# Patient Record
Sex: Female | Born: 1955
Health system: Southern US, Community
[De-identification: ages and names within clinical notes are randomized; demographics above are authoritative.]

## PROBLEM LIST (undated history)

## (undated) DIAGNOSIS — D72829 Elevated white blood cell count, unspecified: Secondary | ICD-10-CM

## (undated) DIAGNOSIS — F32A Depression, unspecified: Secondary | ICD-10-CM

## (undated) DIAGNOSIS — F329 Major depressive disorder, single episode, unspecified: Secondary | ICD-10-CM

## (undated) DIAGNOSIS — I2119 ST elevation (STEMI) myocardial infarction involving other coronary artery of inferior wall: Secondary | ICD-10-CM

## (undated) DIAGNOSIS — M797 Fibromyalgia: Secondary | ICD-10-CM

## (undated) DIAGNOSIS — J45909 Unspecified asthma, uncomplicated: Secondary | ICD-10-CM

## (undated) DIAGNOSIS — F419 Anxiety disorder, unspecified: Secondary | ICD-10-CM

## (undated) DIAGNOSIS — I739 Peripheral vascular disease, unspecified: Secondary | ICD-10-CM

## (undated) DIAGNOSIS — F17200 Nicotine dependence, unspecified, uncomplicated: Secondary | ICD-10-CM

## (undated) DIAGNOSIS — I2510 Atherosclerotic heart disease of native coronary artery without angina pectoris: Secondary | ICD-10-CM

## (undated) DIAGNOSIS — D72821 Monocytosis (symptomatic): Secondary | ICD-10-CM

## (undated) HISTORY — PX: CORONARY STENT PLACEMENT: SHX1402

## (undated) HISTORY — PX: TONSILLECTOMY: SUR1361

## (undated) HISTORY — DX: Elevated white blood cell count, unspecified: D72.829

## (undated) HISTORY — DX: Monocytosis (symptomatic): D72.821

## (undated) HISTORY — PX: CARDIAC CATHETERIZATION: SHX172

## (undated) HISTORY — DX: Peripheral vascular disease, unspecified: I73.9

---
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
## 2005-08-04 ENCOUNTER — Ambulatory Visit (HOSPITAL_COMMUNITY): Admission: RE | Admit: 2005-08-04 | Discharge: 2005-08-04 | Payer: Self-pay | Admitting: Internal Medicine

## 2007-12-06 ENCOUNTER — Encounter: Admission: RE | Admit: 2007-12-06 | Discharge: 2007-12-06 | Payer: Self-pay | Admitting: Otolaryngology

## 2008-08-14 ENCOUNTER — Ambulatory Visit: Payer: Self-pay | Admitting: Internal Medicine

## 2008-08-14 ENCOUNTER — Inpatient Hospital Stay (HOSPITAL_COMMUNITY): Admission: EM | Admit: 2008-08-14 | Discharge: 2008-08-15 | Payer: Self-pay | Admitting: Emergency Medicine

## 2008-12-11 ENCOUNTER — Encounter: Admission: RE | Admit: 2008-12-11 | Discharge: 2008-12-11 | Payer: Self-pay | Admitting: Family Medicine

## 2009-09-15 ENCOUNTER — Encounter: Admission: RE | Admit: 2009-09-15 | Discharge: 2009-09-15 | Payer: Self-pay | Admitting: Family Medicine

## 2010-10-18 ENCOUNTER — Encounter: Payer: Self-pay | Admitting: Family Medicine

## 2010-10-18 ENCOUNTER — Encounter: Payer: Self-pay | Admitting: Otolaryngology

## 2011-02-09 NOTE — Discharge Summary (Signed)
NAMEMARJARIE, [name]            ACCOUNT NO.:  [id number]   MEDICAL RECORD NO.:  [id number]          PATIENT TYPE:  INP   LOCATION:  2807                         FACILITY:  [facility]   PHYSICIAN:  Alvester Morin, M.D.  DATE OF BIRTH:  1956-07-18   DATE OF ADMISSION:  08/14/2008  DATE OF DISCHARGE:  08/15/2008                               DISCHARGE SUMMARY   DISCHARGE DIAGNOSES:  1. Noncardiac chest pain.  2. Diabetes.  3. Hypertension.  4. Hyperlipidemia.  5. Tobacco abuse.  6. Fibromyalgia.  7. Hoarseness.  8. Gingivitis.  9. Gastroesophageal reflux disease.   DISCHARGE MEDICATIONS:  1. Metformin 500 mg 2 tablet by mouth twice a day.  2. Hydrochlorothiazide 25 mg 1 tablet by mouth daily.  3. Aspirin 81 mg 1 tablet by mouth daily.  4. Omeprazole 40 mg 1 tablet by mouth daily.  5. NicoDerm 14 mg apply 1 patch every 24 hours.  6. Pravachol 40 mg 1 tablet by mouth daily.  7. The patient was instructed to use Ultram 50 mg q.6 h. p.r.n. for      pain and to avoid NSAIDs for pain control.  8. The patient was also instructed to stop using Chantix and to stop      using Zegerid.   DISPOSITION AND FOLLOWUP:  The patient had been discharged in stable  condition with a hospital followup with primary care physician Elizabeth Palau, FNP, on August 20, 2008, at 1:30 p.m.  It is going to be  important during that appointment to continue working on the risk  factors modification for this patient, pt will need to control her  hyperlipidemia and also will be important to change the  hydrochlorothiazide that she use 25 daily to probably 12.5 with a  combination of lisinopril, both in 1 tablet in order to provide the  renal protection as well as blood pressure control that she needs as an  Diabetic patient.  It is going to be important to schedule 6 months from  now another CT scan of the chest in order to follow up abnormalities  that were seen during this admission in a CT scan (please  see procedures  report for details).  It is going to be important to continue working  and providing other treatments to help the patient quit smoking and also  for her to avoid secondhand smoking as well.  It will be important to  review the patient's medication bottles and adjust her medications as  needed.  The patient will need a basic metabolic panel in order to  evaluate electrolyte status and renal function.   PROCEDURES PERFORMED DURING THIS ADMISSION:  The patient had a chest x-  ray on August 14, 2008, demonstrated a 1-cm ill-defined opacity in the  right perihilar mid lung.  The lungs were clear without any focal  infiltrate.  There was no edema or pleural effusion.  The  cardiopericardial silhouette was within normal limits for size.  The  patient also had a CT chest without contrast on August 14, 2008, which  demonstrated no definite CT correlation between question nodules on the  chest radiography.  However, there was innumerable diffuse pulmonary  pleural-based nodules bilaterally.  This is a nonspecific finding and  maybe seen with infection/inflammation among other etiologies.  Because  of the risk factors of being a smoker, the recommendation for followup  with another chest CT 6 months from now is recommended in order to rule  out bronchogenic carcinoma.  No other findings or abnormalities were  seen on the CT scan.  The patient also had cardiovascular  catheterization, which was essentially completely clean with just mild  20-30% atherosclerotic changes on RCA.  Ejection fraction was estimated  to be 65%, and there was no wall motion abnormalities.  Cath was  performed by Dr. Clarene Duke.  No other procedures were performed.   CONSULTATIONS:  Cardiology especially [facility].   HISTORY OF PRESENT ILLNESS AT THE MOMENT OF ADMISSION/VITAL SIGNS AND  LABORATORIES:  [name] is a 55 year old female with past  medical history and risk factors of  hypertension, diabetes mellitus,  tobacco abuse, and hyperlipidemia, who came to the emergency department  complaining of chest discomfort, pressure type in the middle of her  chest, nonradiated on and off for the last 3-4 weeks associated with  diaphoresis, dyspnea, and palpitation.  The patient reports that she had  been experiencing the discomfort at rest and also on exertion.  She  endorses that the pain lasts approximately 10-15 minutes, and then goes  away completely.  She had noticing that the pain is aggravated by  strenuous activity and relieved by rest.  The patient endorses weakness  and decreased on her strength in general.   VITAL SIGNS:  Temperature 97.8, blood pressure 119/91, heart rate 106,  respiratory rate 20, and oxygen saturation 99% on room air.   At admission laboratories; we have sodium of 136, potassium 3.6,  chloride 100, bicarb 25, blood sugar 124, creatinine 0.61, and BUN 17.  She had bilirubin of 0.8, alkaline phosphatase of 76, AST 46, ALT 49,  total protein 8.1, albumin blood 4.8, and calcium 10.1.  The patient had  BMP of less than 30.  The patient had CBC demonstrated white blood cell  17.4, hemoglobin 16.6, hematocrit 50.3, and platelets 288.  The patient  had 3 sets of cardiac enzymes and 2 cardiac markers point of care in the  emergency department, all of them completely normal.  The patient had a  D-dimer of 0.29, normal coagulation panel with a PT of 4.4, INR 0.9, and  a PTT of 38.  The patient have lipid profile during this admission that  demonstrated a total cholesterol of 214, triglyceride 183, HDL 33, and  LDL 144.  The patient had a hemoglobin A1c checked, and it was 7.3.  Also, a TSH in a normal range from 1.531.   HOSPITAL COURSE:  Problem #1.  Chest pain.  without coronary artery  disease as etiology of her chest pain after results of a clean cath.  Chest pain and dyspnea could be explained for the use of Chantix.  Decision to stop Chantix  and focus on risk factors modifications have  been made.  The patient is going to continue controlling her diabetes,  is going to use medication to control her blood pressure and we are  going to add Pravachol in order to address her hypercholesterolemia.  The patient is going to be followed by primary care physician on  August 20, 2008, and at that moment complete resolution of her chest  discomfort will be reevaluated.  During this admission, serial sets of  cardiac markers and EKGs were completely normal, also no abnormalities  whatsoever on the monitorization of her cardiac electrical activity.   Problem #2.  Diabetes mellitus with a hemoglobin A1c of 7.3, pretty well  controlled.  No changes are going to be made to her actual regimen.  The  patient is going to continue using metformin 2000 mg daily and is going  to follow by primary care physician on regular basis for further  adjustment.   Problem #3.  Hyperlipidemia.  With a complete abnormal fasting lipid  profile. Will start  Pravachol 40 mg by mouth daily, lifestyle  modification with a low-fat diet and exercise had been recommended.  The  patient is going to be followed as an outpatient and medication  adjustments will be advised by primary care physician..   Problem #4.  Tobacco abuse.  We discontinue Chantix on her and recommend  use of nicotine patch, prescription has been  provided; patient also had  a formal smoking cessation counseling as an inpatient.  She is going to  receive benefit of encouragement and also a different program or  medications if needed in order to quit smoking.  The patient will also  need to follow closely regarding findings on her CT scan during this  admission to rule out bronchogenic carcinoma.   Problem #5.  Nodules finding on her CT of chest during this admission.  As described on procedure report, there was multiple perihilar nodules  visible during this admission on a CT scan of the  chest with a range of  3-4 mm in size.  At this point, a second CT scan with a 43-month interval  had been recommended by the radiologist.  We have been advised the  patient's primary care physician and the patient itself in order to have  that done.  She is going to continue following further if any procedural  biopsy is needed is going to be arranged.   Problem #6.  Hypertension.  At this point, pretty well controlled using  hydrochlorothiazide 25 mg by mouth daily.  Our recommendation for a  diabetic will be to probably decrease to 12.5 and combine the p.o. with  ACE inhibitor in order for the patient to receive not only blood  pressure control, but also renal protection.  The decision is going to  be left at discretion of primary care physician.   Problem #7.  Gastroesophageal reflux disease.  The patient has been  started on omeprazole 40 mg by mouth daily.   Problem #8.  Fibromyalgia.  The patient had been instructed to avoid  indiscriminate use of NSAIDS to control her pain and had been advised to  use Ultram 50 mg by mouth q.6 h. as needed to control her pain.  She is  going to continue following with primary care physician for that  problem.   Problem #9. Gingivitis and gum infection.  The patient is going to  continue following a dentist and other teeth that need to be removed and  going to be removed in order to avoid more serious infection.   At discharge, the patient's vital signs were; blood pressure 131/36,  oxygen saturation 99% on room air, respiratory rate 18, heart rate 88,  temperature 98.0.   Her labs demonstrated CBC with white blood cells of 8.8, hemoglobin  15.0, hematocrit 43.4, and platelet 248, and we have a BMET that showed  sodium of 138, potassium 3.8, chloride 103,  bicarb 25, blood sugar 137,  BUN 16, creatinine 0.59, and calcium 9.5.      Rosanna Randy, MD  Electronically Signed      Alvester Morin, M.D.  Electronically Signed     CEM/MEDQ  D:  08/15/2008  T:  08/16/2008  Job:  [id number]   cc:   Rosey Bath L. Dareen Piano, FNP

## 2011-02-09 NOTE — Cardiovascular Report (Signed)
[name], DRAGOO            ACCOUNT NO.:  [id number]   MEDICAL RECORD NO.:  [id number]          PATIENT TYPE:  INP   LOCATION:  6532                         FACILITY:  [facility]   PHYSICIAN:  Thereasa Solo. Little, M.D. DATE OF BIRTH:  1956/04/07   DATE OF PROCEDURE:  08/15/2008  DATE OF DISCHARGE:                            CARDIAC CATHETERIZATION   INDICATIONS FOR TEST:  This 55 year old female was admitted with chest  pain on August 14, 2008.  Her cardiac markers were negative and her  EKG shows nonspecific T-wave changes.  Her cardiovascular risk factors  include a family history of heart disease, diabetes mellitus, and  cigarette abuse, and a family history of heart disease.   After obtaining informed consent, the patient was prepped and draped in  the usual sterile fashion exposing the right groin.  Following local  anesthetic with 1% Xylocaine, the Seldinger technique was employed.  A 5-  French introducer sheath was placed in the right femoral artery.  Left  and right coronary arteriography and ventriculography in the RAO  projection was performed.   COMPLICATIONS:  None.   EQUIPMENT:  5-French Judkins configuration catheters.   TOTAL CONTRAST:  65 mL.   RESULTS:  1. Hemodynamic monitoring.  Her central aortic pressure was 104/68.      Her left ventricular pressure was 104/1.  There was no significant      gradient noted at the time of pullback from the left ventricle into      the ascending aorta.  2. Ventriculography.  Ventriculography in the RAO projection using 25      mL of contrast at 12 mL per second revealed the LV systolic      function to be normal to slightly hyperdynamic with an ejection      fraction in excess of 65%.  No mitral regurgitation was      appreciated.  Her left ventricular end-diastolic pressure was 7.  3. Coronary arteriography:  No calcification in the distribution of      the coronary arteries were noted on fluoroscopy.  4. Left main was  normal and bifurcated.  5. Circumflex.  The circumflex had minor irregularities in the      proximal portion and gave rise to 1 large OM vessel that was free      of disease.  6. LAD.  The LAD extended down to, but did not reach the apex of the      heart.  Just distal to the takeoff of first diagonal was an area of      30% narrowing with myocardial bridging.  There was TIMI III flow      down the vessel.  The first diagonal had minimal irregularities in      the ostium and otherwise was free of disease.  7. Right coronary artery.  The right coronary artery had a tubular 30%      area of narrowing in its proximal segment with some minimal      irregularities in the mid distal portion of the vessel.  The PDA      and posterior lateral vessel were  free of disease.   CONCLUSION:  1. Normal LV systolic function.  2. 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   I cannot explain her chest pain based on the above cath data.  I did  discuss termination of her cigarette use and risk factor modification  with the patient.  I did not restart her heparin, her arterial sheath  will be removed, and from a cardiac standpoint we will be happy to  reevaluate her as needed.           ______________________________  Thereasa Solo Little, M.D.     ABL/MEDQ  D:  08/15/2008  T:  08/15/2008  Job:  [id number]   cc:   Nanetta Batty, M.D.  Alvester Morin, M.D.  Cardiac Cath Lab

## 2011-06-29 ENCOUNTER — Other Ambulatory Visit: Payer: Self-pay | Admitting: Family Medicine

## 2011-06-29 DIAGNOSIS — J984 Other disorders of lung: Secondary | ICD-10-CM

## 2011-06-30 LAB — HEMOGLOBIN A1C
Hgb A1c MFr Bld: 7.3 — ABNORMAL HIGH
Mean Plasma Glucose: 163

## 2011-06-30 LAB — DIFFERENTIAL
Basophils Absolute: 0.1
Basophils Relative: 0
Eosinophils Absolute: 0.2
Eosinophils Relative: 1
Lymphocytes Relative: 15
Lymphs Abs: 2.6
Monocytes Absolute: 0.9
Monocytes Relative: 5
Neutro Abs: 13.6 — ABNORMAL HIGH
Neutrophils Relative %: 78 — ABNORMAL HIGH

## 2011-06-30 LAB — BASIC METABOLIC PANEL
BUN: 16
CO2: 25
Calcium: 9.5
Chloride: 103
Creatinine, Ser: 0.59
GFR calc Af Amer: >60
GFR calc non Af Amer: >60
Glucose, Bld: 137 — ABNORMAL HIGH
Potassium: 3.4 — ABNORMAL LOW
Sodium: 138

## 2011-06-30 LAB — POCT CARDIAC MARKERS
CKMB, poc: 1.7
Myoglobin, poc: 59.6
Troponin i, poc: <0.05

## 2011-06-30 LAB — COMPREHENSIVE METABOLIC PANEL
ALT: 49 — ABNORMAL HIGH
AST: 46 — ABNORMAL HIGH
Albumin: 4.8
Alkaline Phosphatase: 76
BUN: 17
CO2: 25
Calcium: 10.1
Chloride: 100
Creatinine, Ser: 0.61
GFR calc Af Amer: >60
GFR calc non Af Amer: >60
Glucose, Bld: 124 — ABNORMAL HIGH
Potassium: 3.6
Sodium: 136
Total Bilirubin: 0.8
Total Protein: 8.1

## 2011-06-30 LAB — CBC
HCT: 50.3 — ABNORMAL HIGH
Hemoglobin: 16.6 — ABNORMAL HIGH
MCHC: 33
MCHC: 34.5
MCV: 92.1
Platelets: 288
RBC: 4.82
RBC: 5.47 — ABNORMAL HIGH
RDW: 12.5
RDW: 12.7
WBC: 17.4 — ABNORMAL HIGH

## 2011-06-30 LAB — CARDIAC PANEL(CRET KIN+CKTOT+MB+TROPI)
CK, MB: 1.5
CK, MB: 1.5
Relative Index: INVALID
Relative Index: INVALID
Total CK: 41
Total CK: 47
Troponin I: <0.01
Troponin I: <0.01

## 2011-06-30 LAB — CK TOTAL AND CKMB (NOT AT ARMC)
CK, MB: 1.7
Relative Index: INVALID
Total CK: 59

## 2011-06-30 LAB — GLUCOSE, CAPILLARY
Glucose-Capillary: 126 — ABNORMAL HIGH
Glucose-Capillary: 129 — ABNORMAL HIGH
Glucose-Capillary: 152 — ABNORMAL HIGH

## 2011-06-30 LAB — POCT I-STAT, CHEM 8
BUN: 24 — ABNORMAL HIGH
Calcium, Ion: 1.05 — ABNORMAL LOW
Chloride: 109
Creatinine, Ser: 0.6
Glucose, Bld: 113 — ABNORMAL HIGH
HCT: 52 — ABNORMAL HIGH
Hemoglobin: 17.7 — ABNORMAL HIGH
Potassium: 3.9
Sodium: 139
TCO2: 23

## 2011-06-30 LAB — B-NATRIURETIC PEPTIDE (CONVERTED LAB): Pro B Natriuretic peptide (BNP): <30

## 2011-06-30 LAB — APTT: aPTT: 38 — ABNORMAL HIGH

## 2011-06-30 LAB — TSH: TSH: 1.531

## 2011-06-30 LAB — PROTIME-INR
INR: 0.9
Prothrombin Time: 12.4
Prothrombin Time: 13.2

## 2011-06-30 LAB — LIPID PANEL
HDL: 33 — ABNORMAL LOW
Total CHOL/HDL Ratio: 6.5

## 2011-06-30 LAB — TROPONIN I: Troponin I: 0.01

## 2011-06-30 LAB — D-DIMER, QUANTITATIVE: D-Dimer, Quant: 0.29

## 2011-06-30 LAB — HEPARIN LEVEL (UNFRACTIONATED): Heparin Unfractionated: 0.42

## 2012-02-14 ENCOUNTER — Telehealth: Payer: Self-pay | Admitting: Internal Medicine

## 2012-02-14 NOTE — Telephone Encounter (Signed)
s/w pt and she will c/b with   her wrk sch  aom

## 2012-02-17 ENCOUNTER — Telehealth: Payer: Self-pay | Admitting: Internal Medicine

## 2012-02-17 NOTE — Telephone Encounter (Signed)
l/m to call to sch new pt appt   aom

## 2012-02-18 ENCOUNTER — Telehealth: Payer: Self-pay | Admitting: Internal Medicine

## 2012-02-18 NOTE — Telephone Encounter (Signed)
l/m re new pt ref and ref turned back in    aom

## 2012-07-01 ENCOUNTER — Emergency Department (HOSPITAL_COMMUNITY)
Admission: EM | Admit: 2012-07-01 | Discharge: 2012-07-01 | Disposition: A | Discharge status: Home / Self Care | Payer: 59 | Attending: Emergency Medicine | Admitting: Emergency Medicine

## 2012-07-01 ENCOUNTER — Emergency Department (HOSPITAL_COMMUNITY): Payer: 59

## 2012-07-01 ENCOUNTER — Encounter (HOSPITAL_COMMUNITY): Payer: Self-pay

## 2012-07-01 DIAGNOSIS — M549 Dorsalgia, unspecified: Secondary | ICD-10-CM

## 2012-07-01 DIAGNOSIS — M542 Cervicalgia: Secondary | ICD-10-CM | POA: Insufficient documentation

## 2012-07-01 DIAGNOSIS — Z79899 Other long term (current) drug therapy: Secondary | ICD-10-CM | POA: Insufficient documentation

## 2012-07-01 DIAGNOSIS — M79609 Pain in unspecified limb: Secondary | ICD-10-CM | POA: Insufficient documentation

## 2012-07-01 DIAGNOSIS — R079 Chest pain, unspecified: Secondary | ICD-10-CM | POA: Insufficient documentation

## 2012-07-01 DIAGNOSIS — E119 Type 2 diabetes mellitus without complications: Secondary | ICD-10-CM | POA: Insufficient documentation

## 2012-07-01 DIAGNOSIS — R209 Unspecified disturbances of skin sensation: Secondary | ICD-10-CM | POA: Insufficient documentation

## 2012-07-01 DIAGNOSIS — M546 Pain in thoracic spine: Secondary | ICD-10-CM | POA: Insufficient documentation

## 2012-07-01 DIAGNOSIS — R1110 Vomiting, unspecified: Secondary | ICD-10-CM | POA: Insufficient documentation

## 2012-07-01 HISTORY — DX: Fibromyalgia: M79.7

## 2012-07-01 LAB — HEPATIC FUNCTION PANEL
ALT: 22 U/L (ref 0–35)
AST: 22 U/L (ref 0–37)
Albumin: 4 g/dL (ref 3.5–5.2)
Indirect Bilirubin: 0.1 mg/dL — ABNORMAL LOW (ref 0.3–0.9)
Total Protein: 7.1 g/dL (ref 6.0–8.3)

## 2012-07-01 LAB — BASIC METABOLIC PANEL
CO2: 20 mEq/L (ref 19–32)
Calcium: 10.1 mg/dL (ref 8.4–10.5)
Chloride: 101 mEq/L (ref 96–112)
Potassium: 4 mEq/L (ref 3.5–5.1)
Sodium: 133 mEq/L — ABNORMAL LOW (ref 135–145)

## 2012-07-01 LAB — URINALYSIS, ROUTINE W REFLEX MICROSCOPIC
Hgb urine dipstick: NEGATIVE
Specific Gravity, Urine: 1.01 (ref 1.005–1.030)
Urobilinogen, UA: 0.2 mg/dL (ref 0.0–1.0)

## 2012-07-01 LAB — TROPONIN I: Troponin I: <0.3 ng/mL (ref <0.30)

## 2012-07-01 LAB — CBC WITH DIFFERENTIAL/PLATELET
Basophils Absolute: 0 10*3/uL (ref 0.0–0.1)
Lymphocytes Relative: 12 % (ref 12–46)
Neutro Abs: 14.2 10*3/uL — ABNORMAL HIGH (ref 1.7–7.7)
Neutrophils Relative %: 83 % — ABNORMAL HIGH (ref 43–77)
Platelets: 255 10*3/uL (ref 150–400)
RBC: 4.93 MIL/uL (ref 3.87–5.11)
RDW: 13.1 % (ref 11.5–15.5)
WBC: 17.1 10*3/uL — ABNORMAL HIGH (ref 4.0–10.5)

## 2012-07-01 MED ORDER — HYDROCODONE-ACETAMINOPHEN 5-500 MG PO TABS: 1.0000 | ORAL_TABLET | Freq: Four times a day (QID) | ORAL | Status: DC | PRN

## 2012-07-01 MED ORDER — ONDANSETRON HCL 8 MG PO TABS: 8.0000 mg | ORAL_TABLET | Freq: Three times a day (TID) | ORAL | Status: DC | PRN

## 2012-07-01 MED ORDER — HYDROCODONE-ACETAMINOPHEN 5-325 MG PO TABS
2.0000 | ORAL_TABLET | Freq: Once | ORAL | Status: AC
  Administered 2012-07-01: 2 via ORAL
  Filled 2012-07-01: qty 2

## 2012-07-01 NOTE — ED Provider Notes (Signed)
History  This chart was scribed for Sydney Roots, MD by Erskine Emery. This patient was seen in room APA02/APA02 and the patient's care was started at 14:46.   CSN: [id number]  Arrival date & time 07/01/12  1341   First MD Initiated Contact with Patient 07/01/12 1446      Chief Complaint  Patient presents with  . Chest Pain    (Consider location/radiation/quality/duration/timing/severity/associated sxs/prior treatment) The history is provided by the patient. No language interpreter was used.  [name] is a 56 y.o. female who presents to the Emergency Department complaining of constan neck, upper back/trapezius area pain that radiates to bil arms x 2 days. Pain constant, dull. No specific exacerbating or alleviating symptoms. No pain w rom neck. No stiffness or rigidity. No loss of sensation or weakness, but occasionally tingling bil finger tips. No headache. Occasionally will radiate around to chest. Pain is not pleuritic.denies sob. No leg pain or swelling. No orthopnea or pnd. No hx cad or family hx premature cad.  Pt denies any associated cough or fever.  Pt denies any previous episodes of similar symptoms before this month.  Pt has no h/o DDD, but sees a chiropractor for upper back pain. Pt denies any family h/o heart trouble at a young age. Hx fibromyalgia. Pt is a smoker. No recent injury, neck or back strain.      Past Medical History  Diagnosis Date  . Diabetes mellitus   . Fibromyalgia     History reviewed. No pertinent past surgical history.  No family history on file.  History  Substance Use Topics  . Smoking status: Current Every Day Smoker  . Smokeless tobacco: Not on file  . Alcohol Use: No    OB History    Grav Para Term Preterm Abortions TAB SAB Ect Mult Living                  Review of Systems  Constitutional: Negative for fever and chills.  HENT: Negative for congestion, neck stiffness and sinus pressure.   Eyes: Negative for pain and  redness.  Respiratory: Negative for cough.   Cardiovascular: Positive for chest pain. Negative for palpitations and leg swelling.  Gastrointestinal: Positive for vomiting. Negative for abdominal pain and diarrhea.  Genitourinary: Negative for dysuria and hematuria.  Musculoskeletal: Positive for back pain.       Arm pain  Skin: Negative for rash.  Neurological: Negative for seizures, weakness and headaches.  Hematological: Negative.   Psychiatric/Behavioral: Negative for dysphoric mood.  All other systems reviewed and are negative.    Allergies  Review of patient's allergies indicates no known allergies.  Home Medications   Current Outpatient Rx  Name Route Sig Dispense Refill  . BUSPIRONE HCL 5 MG PO TABS Oral Take 5 mg by mouth 2 (two) times daily.    . CYCLOBENZAPRINE HCL 10 MG PO TABS Oral Take 5-10 mg by mouth 3 (three) times daily as needed. For muscle spasm    . DESVENLAFAXINE SUCCINATE ER 50 MG PO TB24 Oral Take 50 mg by mouth daily.    Marland Kitchen DIPHENHYDRAMINE-APAP (SLEEP) 25-500 MG PO TABS Oral Take 2 tablets by mouth at bedtime as needed. For sleep    . LAMOTRIGINE 100 MG PO TABS Oral Take 100 mg by mouth daily.    Marland Kitchen SAXAGLIPTIN-METFORMIN ER 2.01-999 MG PO TB24 Oral Take 2 tablets by mouth daily.      Triage Vitals: BP 141/85  Pulse 78  Temp 97.9 F (36.6  C) (Oral)  Resp 16  Ht 5\' 4"  (1.626 m)  Wt 146 lb (66.225 kg)  BMI 25.06 kg/m2  SpO2 98%  Physical Exam  Nursing note and vitals reviewed. Constitutional: She is oriented to person, place, and time. She appears well-developed and well-nourished. No distress.  HENT:  Head: Normocephalic and atraumatic.  Nose: Nose normal.  Mouth/Throat: Oropharynx is clear and moist.  Eyes: Conjunctivae normal and EOM are normal.  Neck: Normal range of motion. Neck supple. No tracheal deviation present.       No pain upon ROM. No stiffness or rigidity.  Cardiovascular: Normal rate, regular rhythm, normal heart sounds and intact  distal pulses.   Pulmonary/Chest: Effort normal and breath sounds normal. No respiratory distress. She has no rales.  Abdominal: Soft. Bowel sounds are normal. She exhibits no distension and no mass. There is no tenderness. There is no rebound and no guarding.  Genitourinary:       No cva tenderness  Musculoskeletal: Normal range of motion. She exhibits no edema and no tenderness.       CTLS spine, non tender, aligned, no step off. bil trap muscular tenderness  Neurological: She is alert and oriented to person, place, and time.       No pronator drift. Motor intact bil, strength 5/5, steady gait  Skin: Skin is warm and dry. No rash noted.  Psychiatric: She has a normal mood and affect.    ED Course  Procedures (including critical care time) DIAGNOSTIC STUDIES: Oxygen Saturation is 97% on room air, normal by my interpretation.    COORDINATION OF CARE: 15:03--I evaluated the patient and we discussed a treatment plan including blood work and eKG to which the pt agreed.    Labs Reviewed  CBC WITH DIFFERENTIAL - Abnormal; Notable for the following:    WBC 17.1 (*)     Hemoglobin 15.4 (*)     Neutrophils Relative 83 (*)     Neutro Abs 14.2 (*)     All other components within normal limits  BASIC METABOLIC PANEL - Abnormal; Notable for the following:    Sodium 133 (*)     Glucose, Bld 141 (*)     All other components within normal limits  TROPONIN I   Results for orders placed during the hospital encounter of 07/01/12  CBC WITH DIFFERENTIAL      Component Value Range   WBC 17.1 (*) 4.0 - 10.5 K/uL   RBC 4.93  3.87 - 5.11 MIL/uL   Hemoglobin 15.4 (*) 12.0 - 15.0 g/dL   HCT 16.1  09.6 - 04.5 %   MCV 90.5  78.0 - 100.0 fL   MCH 31.2  26.0 - 34.0 pg   MCHC 34.5  30.0 - 36.0 g/dL   RDW 40.9  81.1 - 91.4 %   Platelets 255  150 - 400 K/uL   Neutrophils Relative 83 (*) 43 - 77 %   Neutro Abs 14.2 (*) 1.7 - 7.7 K/uL   Lymphocytes Relative 12  12 - 46 %   Lymphs Abs 2.1  0.7 - 4.0  K/uL   Monocytes Relative 4  3 - 12 %   Monocytes Absolute 0.7  0.1 - 1.0 K/uL   Eosinophils Relative 1  0 - 5 %   Eosinophils Absolute 0.2  0.0 - 0.7 K/uL   Basophils Relative 0  0 - 1 %   Basophils Absolute 0.0  0.0 - 0.1 K/uL  BASIC METABOLIC PANEL  Component Value Range   Sodium 133 (*) 135 - 145 mEq/L   Potassium 4.0  3.5 - 5.1 mEq/L   Chloride 101  96 - 112 mEq/L   CO2 20  19 - 32 mEq/L   Glucose, Bld 141 (*) 70 - 99 mg/dL   BUN 17  6 - 23 mg/dL   Creatinine, Ser 1.61  0.50 - 1.10 mg/dL   Calcium 09.6  8.4 - 04.5 mg/dL   GFR calc non Af Amer >90  >90 mL/min   GFR calc Af Amer >90  >90 mL/min  TROPONIN I      Component Value Range   Troponin I <0.30  <0.30 ng/mL  HEPATIC FUNCTION PANEL      Component Value Range   Total Protein 7.1  6.0 - 8.3 g/dL   Albumin 4.0  3.5 - 5.2 g/dL   AST 22  0 - 37 U/L   ALT 22  0 - 35 U/L   Alkaline Phosphatase 81  39 - 117 U/L   Total Bilirubin 0.2 (*) 0.3 - 1.2 mg/dL   Bilirubin, Direct 0.1  0.0 - 0.3 mg/dL   Indirect Bilirubin 0.1 (*) 0.3 - 0.9 mg/dL  LIPASE, BLOOD      Component Value Range   Lipase 26  11 - 59 U/L  URINALYSIS, ROUTINE W REFLEX MICROSCOPIC      Component Value Range   Color, Urine STRAW (*) YELLOW   APPearance CLEAR  CLEAR   Specific Gravity, Urine 1.010  1.005 - 1.030   pH 5.5  5.0 - 8.0   Glucose, UA NEGATIVE  NEGATIVE mg/dL   Hgb urine dipstick NEGATIVE  NEGATIVE   Bilirubin Urine NEGATIVE  NEGATIVE   Ketones, ur NEGATIVE  NEGATIVE mg/dL   Protein, ur NEGATIVE  NEGATIVE mg/dL   Urobilinogen, UA 0.2  0.0 - 1.0 mg/dL   Nitrite NEGATIVE  NEGATIVE   Leukocytes, UA NEGATIVE  NEGATIVE   Dg Chest 2 View  07/01/2012  *RADIOLOGY REPORT*  Clinical Data: Chest, upper back, neck and shoulder pain with vomiting.  CHEST - 2 VIEW  Comparison: Radiographs 08/14/2008.  CT 09/15/2009.  Findings: The heart size and mediastinal contours are normal. The lungs are clear. There is no pleural effusion or pneumothorax. No acute  osseous findings are identified.  Telemetry leads overlie the chest.  IMPRESSION: Stable examination.  No acute cardiopulmonary process.   Original Report Authenticated By: Gerrianne Scale, M.D.    Dg Cervical Spine Complete  07/01/2012  *RADIOLOGY REPORT*  Clinical Data: Neck, upper back and left shoulder pain.  CERVICAL SPINE - COMPLETE 4+ VIEW  Comparison: Neck CT 12/06/2007.  Findings: The prevertebral soft tissues are normal.  The alignment is near anatomic.  There is asymmetric right-sided facet hypertrophy as demonstrated on the prior examination.  This contributes to some right foraminal stenosis at C3-C4 and C4-C5. The left foramina appear patent.  No acute osseous findings are seen.  IMPRESSION: Stable spondylosis with asymmetric facet hypertrophy on the right. No acute osseous findings.   Original Report Authenticated By: Gerrianne Scale, M.D.        MDM  I personally performed the services described in this documentation, which was scribed in my presence. The recorded information has been reviewed and considered. Sydney Roots, MD  Reviewed nursing notes and prior charts for additional history.   Reviewed prior charts, prior eval for cp noted w cath w no signif cad.   Date: 07/01/2012  Rate: 83  Rhythm: normal sinus rhythm  QRS Axis: normal  Intervals: normal  ST/T Wave abnormalities: normal  Conduction Disutrbances:none  Narrative Interpretation:   Old EKG Reviewed: unchanged   After symptoms present/constant x 2 days, trop neg. Prior cardiac cath without signif cad. No nv in ed. abd soft nt.   Pt appears stable for d/c.     Sydney Roots, MD 07/01/12 1655

## 2012-07-01 NOTE — ED Notes (Signed)
Pt reports since Thursday has been having pain in upper back radiating to arms.  Today started having pain in chest.  Also reports vomiting x 3 weeks and episodes of being diaphoretic.

## 2012-07-07 ENCOUNTER — Other Ambulatory Visit: Payer: Self-pay | Admitting: Cardiology

## 2012-07-07 ENCOUNTER — Inpatient Hospital Stay (HOSPITAL_COMMUNITY)
Admission: EM | Admit: 2012-07-07 | Discharge: 2012-07-08 | DRG: 247 | Discharge status: Against Medical Advice | Payer: 59 | Attending: Cardiology | Admitting: Cardiology

## 2012-07-07 ENCOUNTER — Other Ambulatory Visit: Payer: Self-pay

## 2012-07-07 ENCOUNTER — Observation Stay (HOSPITAL_COMMUNITY): Admission: AD | Admit: 2012-07-07 | Payer: 59 | Source: Ambulatory Visit | Admitting: Cardiology

## 2012-07-07 ENCOUNTER — Encounter (HOSPITAL_COMMUNITY)
Admission: EM | Discharge status: Against Medical Advice | Payer: Self-pay | Source: Home / Self Care | Attending: Cardiology

## 2012-07-07 ENCOUNTER — Encounter (HOSPITAL_COMMUNITY): Payer: Self-pay | Admitting: *Deleted

## 2012-07-07 ENCOUNTER — Emergency Department (HOSPITAL_COMMUNITY): Payer: 59

## 2012-07-07 ENCOUNTER — Ambulatory Visit (HOSPITAL_COMMUNITY): Admit: 2012-07-07 | Payer: Self-pay | Admitting: Cardiovascular Disease

## 2012-07-07 DIAGNOSIS — IMO0001 Reserved for inherently not codable concepts without codable children: Secondary | ICD-10-CM | POA: Diagnosis present

## 2012-07-07 DIAGNOSIS — I2510 Atherosclerotic heart disease of native coronary artery without angina pectoris: Secondary | ICD-10-CM | POA: Diagnosis present

## 2012-07-07 DIAGNOSIS — I2119 ST elevation (STEMI) myocardial infarction involving other coronary artery of inferior wall: Principal | ICD-10-CM | POA: Diagnosis present

## 2012-07-07 DIAGNOSIS — E785 Hyperlipidemia, unspecified: Secondary | ICD-10-CM | POA: Diagnosis present

## 2012-07-07 DIAGNOSIS — F17200 Nicotine dependence, unspecified, uncomplicated: Secondary | ICD-10-CM | POA: Diagnosis present

## 2012-07-07 DIAGNOSIS — I214 Non-ST elevation (NSTEMI) myocardial infarction: Secondary | ICD-10-CM

## 2012-07-07 DIAGNOSIS — J329 Chronic sinusitis, unspecified: Secondary | ICD-10-CM | POA: Diagnosis present

## 2012-07-07 DIAGNOSIS — F329 Major depressive disorder, single episode, unspecified: Secondary | ICD-10-CM | POA: Diagnosis present

## 2012-07-07 DIAGNOSIS — M797 Fibromyalgia: Secondary | ICD-10-CM | POA: Diagnosis present

## 2012-07-07 DIAGNOSIS — E1165 Type 2 diabetes mellitus with hyperglycemia: Secondary | ICD-10-CM | POA: Diagnosis present

## 2012-07-07 DIAGNOSIS — E119 Type 2 diabetes mellitus without complications: Secondary | ICD-10-CM

## 2012-07-07 DIAGNOSIS — I959 Hypotension, unspecified: Secondary | ICD-10-CM | POA: Diagnosis not present

## 2012-07-07 DIAGNOSIS — F32A Depression, unspecified: Secondary | ICD-10-CM | POA: Diagnosis present

## 2012-07-07 DIAGNOSIS — F3289 Other specified depressive episodes: Secondary | ICD-10-CM | POA: Diagnosis present

## 2012-07-07 DIAGNOSIS — F17210 Nicotine dependence, cigarettes, uncomplicated: Secondary | ICD-10-CM | POA: Diagnosis present

## 2012-07-07 DIAGNOSIS — I10 Essential (primary) hypertension: Secondary | ICD-10-CM | POA: Diagnosis present

## 2012-07-07 DIAGNOSIS — Z79899 Other long term (current) drug therapy: Secondary | ICD-10-CM

## 2012-07-07 DIAGNOSIS — IMO0002 Reserved for concepts with insufficient information to code with codable children: Secondary | ICD-10-CM | POA: Diagnosis present

## 2012-07-07 HISTORY — DX: ST elevation (STEMI) myocardial infarction involving other coronary artery of inferior wall: I21.19

## 2012-07-07 HISTORY — DX: Nicotine dependence, unspecified, uncomplicated: F17.200

## 2012-07-07 HISTORY — DX: Major depressive disorder, single episode, unspecified: F32.9

## 2012-07-07 HISTORY — PX: PERCUTANEOUS CORONARY STENT INTERVENTION (PCI-S): SHX5485

## 2012-07-07 HISTORY — DX: Atherosclerotic heart disease of native coronary artery without angina pectoris: I25.10

## 2012-07-07 HISTORY — DX: Depression, unspecified: F32.A

## 2012-07-07 HISTORY — PX: LEFT HEART CATHETERIZATION WITH CORONARY ANGIOGRAM: SHX5451

## 2012-07-07 HISTORY — DX: Anxiety disorder, unspecified: F41.9

## 2012-07-07 HISTORY — DX: Unspecified asthma, uncomplicated: J45.909

## 2012-07-07 LAB — CBC WITH DIFFERENTIAL/PLATELET
Hemoglobin: 15.8 g/dL — ABNORMAL HIGH (ref 12.0–15.0)
Lymphocytes Relative: 12 % (ref 12–46)
Lymphs Abs: 2.8 10*3/uL (ref 0.7–4.0)
Monocytes Relative: 8 % (ref 3–12)
Neutro Abs: 17.6 10*3/uL — ABNORMAL HIGH (ref 1.7–7.7)
Neutrophils Relative %: 77 % (ref 43–77)
RBC: 5.02 MIL/uL (ref 3.87–5.11)

## 2012-07-07 LAB — COMPREHENSIVE METABOLIC PANEL
Albumin: 4.2 g/dL (ref 3.5–5.2)
Alkaline Phosphatase: 73 U/L (ref 39–117)
BUN: 15 mg/dL (ref 6–23)
CO2: 27 mEq/L (ref 19–32)
Chloride: 101 mEq/L (ref 96–112)
GFR calc Af Amer: >90 mL/min (ref >90)
Glucose, Bld: 150 mg/dL — ABNORMAL HIGH (ref 70–99)
Potassium: 4.7 mEq/L (ref 3.5–5.1)
Total Bilirubin: 0.2 mg/dL — ABNORMAL LOW (ref 0.3–1.2)

## 2012-07-07 LAB — TROPONIN I: Troponin I: 3.79 ng/mL (ref <0.30)

## 2012-07-07 LAB — APTT: aPTT: 41 seconds — ABNORMAL HIGH (ref 24–37)

## 2012-07-07 LAB — GLUCOSE, CAPILLARY: Glucose-Capillary: 151 mg/dL — ABNORMAL HIGH (ref 70–99)

## 2012-07-07 SURGERY — LEFT HEART CATHETERIZATION WITH CORONARY ANGIOGRAM
Anesthesia: LOCAL

## 2012-07-07 MED ORDER — ONDANSETRON HCL 4 MG/2ML IJ SOLN
INTRAMUSCULAR | Status: AC
  Filled 2012-07-07: qty 2

## 2012-07-07 MED ORDER — NITROGLYCERIN 0.4 MG SL SUBL: 0.4000 mg | SUBLINGUAL_TABLET | SUBLINGUAL | Status: DC | PRN

## 2012-07-07 MED ORDER — MIDAZOLAM HCL 2 MG/2ML IJ SOLN
INTRAMUSCULAR | Status: AC
  Filled 2012-07-07: qty 2

## 2012-07-07 MED ORDER — NITROGLYCERIN IN D5W 200-5 MCG/ML-% IV SOLN
3.0000 ug/min | INTRAVENOUS | Status: DC
  Administered 2012-07-07: 10 ug/min via INTRAVENOUS
  Filled 2012-07-07: qty 250

## 2012-07-07 MED ORDER — METOPROLOL TARTRATE 25 MG PO TABS
25.0000 mg | ORAL_TABLET | Freq: Two times a day (BID) | ORAL | Status: DC
  Administered 2012-07-07: 25 mg via ORAL
  Filled 2012-07-07 (×3): qty 1

## 2012-07-07 MED ORDER — ASPIRIN 300 MG RE SUPP
300.0000 mg | RECTAL | Status: DC
  Filled 2012-07-07: qty 1

## 2012-07-07 MED ORDER — SODIUM CHLORIDE 0.9 % IV SOLN: INTRAVENOUS | Status: AC

## 2012-07-07 MED ORDER — FENTANYL CITRATE 0.05 MG/ML IJ SOLN
INTRAMUSCULAR | Status: AC
Start: 2012-07-07 — End: 2012-07-07
  Filled 2012-07-07: qty 2

## 2012-07-07 MED ORDER — PRASUGREL HCL 10 MG PO TABS
10.0000 mg | ORAL_TABLET | Freq: Every day | ORAL | Status: DC
  Administered 2012-07-08: 10 mg via ORAL
  Filled 2012-07-07: qty 1

## 2012-07-07 MED ORDER — ACETAMINOPHEN 325 MG PO TABS: 650.0000 mg | ORAL_TABLET | ORAL | Status: DC | PRN

## 2012-07-07 MED ORDER — HEPARIN BOLUS VIA INFUSION: 4000.0000 [IU] | Freq: Once | INTRAVENOUS | Status: DC

## 2012-07-07 MED ORDER — ONDANSETRON HCL 4 MG/2ML IJ SOLN: 4.0000 mg | Freq: Four times a day (QID) | INTRAMUSCULAR | Status: DC | PRN

## 2012-07-07 MED ORDER — ATROPINE SULFATE 1 MG/ML IJ SOLN
INTRAMUSCULAR | Status: AC
  Filled 2012-07-07: qty 1

## 2012-07-07 MED ORDER — ASPIRIN 81 MG PO CHEW
81.0000 mg | CHEWABLE_TABLET | Freq: Every day | ORAL | Status: DC
  Administered 2012-07-08: 81 mg via ORAL
  Filled 2012-07-07: qty 1

## 2012-07-07 MED ORDER — ASPIRIN 81 MG PO CHEW: 324.0000 mg | CHEWABLE_TABLET | ORAL | Status: DC

## 2012-07-07 MED ORDER — ASPIRIN EC 81 MG PO TBEC: 81.0000 mg | DELAYED_RELEASE_TABLET | Freq: Every day | ORAL | Status: DC

## 2012-07-07 MED ORDER — HEPARIN (PORCINE) IN NACL 100-0.45 UNIT/ML-% IJ SOLN
800.0000 [IU]/h | INTRAMUSCULAR | Status: DC
  Filled 2012-07-07: qty 250

## 2012-07-07 MED ORDER — LIDOCAINE HCL (PF) 1 % IJ SOLN
INTRAMUSCULAR | Status: AC
  Filled 2012-07-07: qty 30

## 2012-07-07 MED ORDER — NITROGLYCERIN 0.2 MG/ML ON CALL CATH LAB
INTRAVENOUS | Status: AC
  Filled 2012-07-07: qty 1

## 2012-07-07 MED ORDER — BIVALIRUDIN 250 MG IV SOLR
INTRAVENOUS | Status: AC
  Filled 2012-07-07: qty 250

## 2012-07-07 MED ORDER — SODIUM CHLORIDE 0.9 % IV SOLN
0.2500 mg/kg/h | INTRAVENOUS | Status: AC
  Filled 2012-07-07: qty 250

## 2012-07-07 MED ORDER — ATORVASTATIN CALCIUM 80 MG PO TABS
80.0000 mg | ORAL_TABLET | Freq: Every day | ORAL | Status: DC
  Administered 2012-07-07: 80 mg via ORAL
  Filled 2012-07-07 (×2): qty 1

## 2012-07-07 MED ORDER — PRASUGREL HCL 10 MG PO TABS
ORAL_TABLET | ORAL | Status: AC
  Filled 2012-07-07: qty 6

## 2012-07-07 NOTE — Progress Notes (Signed)
Checked back on her. Still having some chest pressure. I once again encouraged cath. She seemed more calm at this time without her husband in the room. She wanted to talk it over with her husband.   In the mean time, I spoke to nurse to get orders taken off and begun while she is waiting for bed upstairs.

## 2012-07-07 NOTE — ED Provider Notes (Signed)
History     CSN: [id number]  Arrival date & time 07/07/12  1635   First MD Initiated Contact with Patient 07/07/12 1645      No chief complaint on file.   (Consider location/radiation/quality/duration/timing/severity/associated sxs/prior treatment) Patient is a 56 y.o. female presenting with chest pain. The history is provided by the patient.  Chest Pain The chest pain began 5 - 7 days ago. Duration of episode(s) is 1 hour. Chest pain occurs intermittently. The chest pain is worsening. The pain is associated with exertion. At its most intense, the pain is at 9/10. The pain is currently at 0/10. The severity of the pain is severe. The quality of the pain is described as dull, heavy, pressure-like, similar to previous episodes and squeezing. The pain radiates to the left neck, left shoulder, right shoulder, right arm, left arm and left jaw. Exacerbated by: seems to be worse with exertion and lying down. Primary symptoms include shortness of breath and cough. Pertinent negatives for primary symptoms include no wheezing, no palpitations, no abdominal pain, no nausea and no vomiting.  Associated symptoms include diaphoresis.  Pertinent negatives for associated symptoms include no near-syncope. She tried narcotics for the symptoms. Risk factors include smoking/tobacco exposure.  Her past medical history is significant for diabetes, hyperlipidemia and hypertension.  Pertinent negatives for past medical history include no CAD.  Procedure history is positive for cardiac catheterization. Procedure history comments: cath in 2010 that was neg.     Past Medical History  Diagnosis Date  . Diabetes mellitus   . Fibromyalgia     No past surgical history on file.  No family history on file.  History  Substance Use Topics  . Smoking status: Current Every Day Smoker  . Smokeless tobacco: Not on file  . Alcohol Use: No    OB History    Grav Para Term Preterm Abortions TAB SAB Ect Mult Living                    Review of Systems  Constitutional: Positive for diaphoresis.  Respiratory: Positive for cough and shortness of breath. Negative for wheezing.        Pt states recently finished an abx for sinus infection and productive cough  Cardiovascular: Positive for chest pain. Negative for palpitations and near-syncope.  Gastrointestinal: Negative for nausea, vomiting and abdominal pain.  All other systems reviewed and are negative.    Allergies  Review of patient's allergies indicates no known allergies.  Home Medications   Current Outpatient Rx  Name Route Sig Dispense Refill  . BUSPIRONE HCL 5 MG PO TABS Oral Take 5 mg by mouth 2 (two) times daily.    Marland Kitchen CARISOPRODOL 350 MG PO TABS Oral Take 350 mg by mouth 4 (four) times daily as needed. Muscle pain    . DESVENLAFAXINE SUCCINATE ER 50 MG PO TB24 Oral Take 50 mg by mouth daily.    Marland Kitchen HYDROCODONE-ACETAMINOPHEN 5-500 MG PO TABS Oral Take 1-2 tablets by mouth every 6 (six) hours as needed for pain. 20 tablet 0  . LAMOTRIGINE 100 MG PO TABS Oral Take 100 mg by mouth daily.    . CYCLOBENZAPRINE HCL 10 MG PO TABS Oral Take 5-10 mg by mouth 3 (three) times daily as needed. For muscle spasm    . DIPHENHYDRAMINE-APAP (SLEEP) 25-500 MG PO TABS Oral Take 2 tablets by mouth at bedtime as needed. For sleep    . ONDANSETRON HCL 8 MG PO TABS Oral Take 1 tablet (  8 mg total) by mouth every 8 (eight) hours as needed for nausea. 8 tablet 0  . SAXAGLIPTIN-METFORMIN ER 2.01-999 MG PO TB24 Oral Take 2 tablets by mouth daily.      BP 139/80  Pulse 94  Temp 98.3 F (36.8 C) (Oral)  Resp 17  SpO2 98%  Physical Exam  Nursing note and vitals reviewed. Constitutional: She is oriented to person, place, and time. She appears well-developed and well-nourished. No distress.  HENT:  Head: Normocephalic and atraumatic.  Mouth/Throat: Oropharynx is clear and moist.  Eyes: Conjunctivae normal and EOM are normal. Pupils are equal, round, and reactive  to light.  Neck: Normal range of motion. Neck supple.  Cardiovascular: Normal rate, regular rhythm and intact distal pulses.   No murmur heard. Pulmonary/Chest: Effort normal and breath sounds normal. No respiratory distress. She has no wheezes. She has no rales.  Abdominal: Soft. She exhibits no distension. There is no tenderness. There is no rebound and no guarding.  Musculoskeletal: Normal range of motion. She exhibits edema. She exhibits no tenderness.       2+ edema in lower ext  Neurological: She is alert and oriented to person, place, and time.  Skin: Skin is warm and dry. No rash noted. No erythema.  Psychiatric: She has a normal mood and affect. Her behavior is normal.    ED Course  Procedures (including critical care time)  Labs Reviewed - No data to display Dg Chest 2 View  07/07/2012  *RADIOLOGY REPORT*  Clinical Data: Intermittent chest pain.  Productive cough.  Recent myocardial infarction.  Tobacco use.  CHEST - 2 VIEW  Comparison: 07/01/2012  Findings: Lower cervical spondylosis noted.  Faintly prominent interstitium is common in smokers.  The lungs appear otherwise clear. Cardiac and mediastinal contours appear unremarkable.  No pleural effusion observed.  IMPRESSION:  1.  Faintly prominent interstitium, characteristic of smokers. Otherwise the lungs appear clear. 2.  Lower cervical spondylosis.   Original Report Authenticated By: Dellia Cloud, M.D.      Date: 07/07/2012  Rate: 88  Rhythm: normal sinus rhythm  QRS Axis: normal  Intervals: normal  ST/T Wave abnormalities: nonspecific ST/T changes, inferior more pronounced q-waves  Conduction Disutrbances:none  Narrative Interpretation:   Old EKG Reviewed: changes noted   1. NSTEMI (non-ST elevated myocardial infarction)    CRITICAL CARE Performed by: Gwyneth Sprout   Total critical care time: 30  Critical care time was exclusive of separately billable procedures and treating other  patients.  Critical care was necessary to treat or prevent imminent or life-threatening deterioration.  Critical care was time spent personally by me on the following activities: development of treatment plan with patient and/or surrogate as well as nursing, discussions with consultants, evaluation of patient's response to treatment, examination of patient, obtaining history from patient or surrogate, ordering and performing treatments and interventions, ordering and review of laboratory studies, ordering and review of radiographic studies, pulse oximetry and re-evaluation of patient's condition.    MDM   Patient since in from Dr. Norris Cross office with worsening chest pain concerning for ACS. Patient has new more pronounced Q waves inferiorly and had lab tests drawn at Dr. Norris Cross office with apparently an abnormal troponin. Patient currently has already had aspirin and denies pain.  She had a normal cath in 2010 however patient continues to smoke and is diabetic. The symptoms are worse with exertion and with lying down she denies pain exacerbation by eating. Currently she has normal vital signs.  It  appears the Dr. Mayford Knife was planning on admitting the pt for CP work up but will confirm.  6:36 PM It was confirmed with cards that Troponin was 3 and CK-MB was 25.  They will admit the pt.    Gwyneth Sprout, MD 07/07/12 [phone]

## 2012-07-07 NOTE — Consult Note (Addendum)
ANTICOAGULATION CONSULT NOTE - Initial Consult  Pharmacy Consult for Heparin Indication: chest pain/ACS  No Known Allergies  Patient Measurements: Height: 5' 4.17" (163 cm) Weight: 145 lb 15.1 oz (66.2 kg) IBW/kg (Calculated) : 55.1  Heparin Dosing Weight: 66kg  Vital Signs: Temp: 98.3 F (36.8 C) (10/11 1645) Temp src: Oral (10/11 1645) BP: 141/79 mmHg (10/11 1700) Pulse Rate: 83  (10/11 1700)  Labs: No results found for this basename: HGB:2,HCT:3,PLT:3,APTT:3,LABPROT:3,INR:3,HEPARINUNFRC:3,CREATININE:3,CKTOTAL:3,CKMB:3,TROPONINI:3 in the last 72 hours  Estimated Creatinine Clearance: 75.5 ml/min (by C-G formula based on Cr of 0.52).   Medical History: Past Medical History  Diagnosis Date  . Diabetes mellitus   . Fibromyalgia    Assessment: 56yof to begin heparin awaiting urgent cath. BMET and CBC from 07/01/12 wnl.  Goal of Therapy:  Heparin level 0.3-0.7 units/ml Monitor platelets by anticoagulation protocol: Yes   Plan:  1) Heparin bolus 4000 units x 1 2) Heparin drip at 800 units/hr 3) Follow up after cath  Fredrik Rigger 07/07/2012,7:34 PM  Dr. Anne Fu said to hold off on starting the heparin as patient is going to cath right now. Will follow up after.  Fredrik Rigger 07/07/12, 7:40 PM

## 2012-07-07 NOTE — ED Notes (Signed)
Patient transported to X-ray 

## 2012-07-07 NOTE — Interval H&P Note (Signed)
History and Physical Interval Note:  07/07/2012 8:15 PM  [name]  has presented today for cardiac cath with the diagnosis of Non-Stemi  The various methods of treatment have been discussed with the patient and family. After consideration of risks, benefits and other options for treatment, the patient has consented to  Procedure(s) (LRB) with comments: LEFT HEART CATHETERIZATION WITH CORONARY ANGIOGRAM (N/A) as a surgical intervention .  The patient's history has been reviewed, patient examined, no change in status, stable for surgery.  I have reviewed the patient's chart and labs.  Questions were answered to the patient's satisfaction.     Ekta Dancer

## 2012-07-07 NOTE — CV Procedure (Signed)
Cardiac Catheterization Operative Report  [name] [id number] 10/11/20139:04 PM Sydney Alken, MD  Procedure Performed:  1. Left Heart Catheterization 2. Selective Coronary Angiography 3. Left ventricular angiogram 4. PTCA/DES x 1 distal RCA  Operator: Sydney Carrow, MD  Indication: 56 yo female with history of mild to moderate CAD by cath 2009, ongoing tobacco abuse, DM admitted with NSTEMI. Ongoing chest pain tonight. Urgent cath arranged by Dr. Anne Riddle who saw her in the ED.                                       Procedure Details: The risks, benefits, complications, treatment options, and expected outcomes were discussed with the patient. The patient and/or family concurred with the proposed plan, giving informed consent. The patient was brought to the cath lab after IV hydration was begun and oral premedication was given. The patient was further sedated with Versed and Fentanyl. The right groin was prepped and draped in the usual manner. Using the modified Seldinger access technique, a 6 French sheath was placed in the right femoral artery. Standard diagnostic catheters were used to perform selective coronary angiography. She was found to have a severe stenosis of the distal RCA. We elected to proceed to intervention. The patient was given a bolus of Angiomax and a drip was started. She was given Effient 60 mg po x 1. When the ACT was greater than 200, I engaged the RCA with a JR4 guiding catheter with side holes. I then passed a Cougar IC wire down the RCA. A 2.0 x 12 mm balloon was used to pre-dilate the severe stenosis. I then carefully positioned and deployed a 2.5 x 20 mm Promus Element DES in the distal RCA. This was post-dilated with a 2.5 x 15 mm Elida balloon. The stenosis was taken from 99% down to 0%. There was excellent flow into the distal vessel. The coronary wire was removed.  The guiding catheter was removed over a wire. A pigtail catheter was used to  perform a left ventricular angiogram.  There were no immediate complications. The patient was taken to the recovery area in stable condition.   Hemodynamic Findings: Central aortic pressure: 106/61 Left ventricular pressure: 103/4/10  Angiographic Findings:  Left main:  No obstructive disease.   Left Anterior Descending Artery: Moderate caliber vessel that does not reach the apex. There is 20% proximal stenosis and 30% mid stenosis. The diagonal branch is small to moderate sized and has mild plaque disease.   Circumflex Artery: Moderate caliber vessel with 20% proximal stenosis. There is a moderate sized obtuse marginal branch with mild non-obstructive plaque. There is a very small intermediate branch.   Right Coronary Artery: Moderate caliber vessel with diffuse 20% proximal and mid stenosis. The distal vessel has a 99% stenosis. The PDA and PLA are small in caliber and patent.   Left Ventricular Angiogram: LVEF 55%. Sublet inferior wall hypokinesis.   Impression: 1. NSTEMI secondary to severe stenosis distal RCA 2. Successful PTCA/DES x 1 distal RCA 3. Preserved LV systolic function with subtle inferior wall motion abnormality 4. Mild non-obstructive disease in LAD and Circumflex  Recommendations: She will need dual anti-platelet therapy with ASA and Effient for one year. Would start statin. Would start a beta blocker if her BP can tolerate.        Complications:  None. The patient tolerated the procedure well.

## 2012-07-07 NOTE — H&P (Signed)
Progress Notes     Patient: [name], [name] Provider: Armanda Magic, MD  DOB: June 18, 1956 Age: 56 Y Sex: Female Date: 07/07/2012  Phone: [phone]   Address: [address], [place], [id number]  Pcp: ELIZABETH BARNES       Subjective:     CC:    1. REFERRED DR Juluis Rainier EVALUATE CP/Hospital visit on saturday.        HPI:  General:  The patient presents today for evaluation of chest pain. She states that last Saturday she developed chest pain at rest. The pain is described as a sharp aching sensation like a muscle spasm which comes and goes. It is nonexertional. It is located over her chest with radiation in to her neck, jaw and both arms. It occurs 4-6 times daily. She gets SOB with the pain and diaphoresis. She has a history of panic attacks with chest pain in 2010. She had a heart cath at that time which showed normal coronary arteries. She went to the ER this past weekend and workup was normal. She was given an prescription for hydrocodone which has helped with the pain. She was started on Lamictal and Pristiq a few months ago and since then has had night sweats and has not felt well. She has a history of fibromyalgia pain as well..        ROS:  See HPI, A twelve system review was perfomed at today's visit. For pertinent positives and negatives see HPI.       Medical History: Diabetes II, Fibromyalgia, history of multiple nodules on chest CT, seemingly resolved, hoarseness with negative ENT evaluation, Smoker, anxiety, depression Saul Fordyce), Chronic sinus problems, Increased Lipids (pt refuses medications), Hx of psoriasis rash when stressed, History of elevated white count, History of negative cardiac catheter 07/2008, History elevated liver functions, History chronic reflux, History chronic gingivitis.        Gyn History:        OB History:        Surgical History: tonsillectomy , pilonidal cyst .        Hospitalization/Major Diagnostic Procedure: childbirth  x 3 .        Family History: Father: deceased bone cancer, Insulin dependant Mother: deceased kidney failure, DM II Brother 1: alive h/o knee/hip replacement Brother2: alive h/o broken hip Sister 1: deceased bone and lung cancer Sister 2: alive Children: alive x 3, one with MS, other two DM I  no autoimmune disease. Daughter has MS.       Social History:  General:  History of smoking  cigarettes: Current smoker Frequency: 1.5 PPD for 40 years no Alcohol.  Caffeine: yes, 1 cup a day of espresso.  no Recreational drug use.  Diet: high protein.  no Exercise.  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  Marital Status: married.  Children: three.        Medications: OneTouch Ultra Test . Strip 1 test once daily as directed, OneTouch Delica Lancets . Miscellaneous 1 test once daily as directed, Pristiq 50 MG Tablet Extended Release 24 Hour 1 tablet Once a day, BusPIRone HCl 5 MG Tablet 1 Tablet Twice a day, Lamotrigine 100 MG Tablet 1 tablet Once a day, Kombiglyze XR 2.01-999 MG Tablet Extended Release 24 Hour 2 tablets Once a day, Soma 350 MG Tablet 1 tablet as needed Four times a day as needed for muscle pain, Vicodin 5-500 MG Tablet 1 tablet as needed for pain every 6 hrs, Cyclobenzaprine-Diet Manage Pr  10 MG Miscellaneous 1/2 tablet to 1 tablet Daily, Medication List reviewed and reconciled with the patient       Allergies: Chantix: chest pain and panic attacks , Cholesterol Medications: Increased pain levels: Side Effects, cymbalta : sleepy : Side Effects.       Objective:     Vitals: Wt 146.8, Wt change -3 lb, Ht 63.75, BMI 25.39, Pulse sitting 100, BP sitting 130/88.       Examination:  Cardiology, General:  GENERAL APPEARANCE: pleasant, NAD.  HEENT: unremarkable.  CAROTID UPSTROKE: normal, no bruit.  JVD: flat.  HEART SOUNDS: regular, normal S1, S2, no S3 or S4.  MURMUR: absent.  LUNGS: no rales or wheezes.  ABDOMEN: soft, non tender, positive  bowel sounds, no masses felt.  EXTREMITIES: no leg edema.  PERIPHERAL PULSES: 2 plus bilateral.        Assessment:     Assessment:  1. Unstable angina - 411.1 (Primary)  2. Tobacco abuse - 305.1  3. Fibromyalgia - 729.1  4. Depression, major - 296.20    Plan:     1. Unstable angina  Despite that fact that she has had a normal cath in 2009, she as DM and continues to smoke. I have recommended that she be admitted to rule out MI. Some of her symptoms appeared after starting lamictal and Pristiq. She also had fibromyalgia so this could be a flare of that. I will cycle her cardiac enzymes, start IV Heparin gtt, check 2D echo to assess LVF, check BNP due to SOB and LE edema. If cardiac enzymes and echo normal then will plan nuclear stress test in the am.        Immunizations:        Labs:        Preventive:        Provider: Armanda Magic, MD  Patient: [name], Mahogany Riddle DOB: 08-08-1956 Date: 07/07/2012

## 2012-07-07 NOTE — Progress Notes (Addendum)
After speaking again with her and her husband, she is willing to proceed with heart cath. Still with ongoing pressure. Discussed with Dr. Clifton James who is finishing up cath. Risks of stroke MI death, bleeding, arterial damage explained to her. She is willing to proceed. Appreciate cath lab team assistance.    Prior Cath report: DATE OF PROCEDURE: 08/15/2008  DATE OF DISCHARGE:  CARDIAC CATHETERIZATION  INDICATIONS FOR TEST: This 56 year old female was admitted with chest  pain on August 14, 2008. Her cardiac markers were negative and her  EKG shows nonspecific T-wave changes. Her cardiovascular risk factors  include a family history of heart disease, diabetes mellitus, and  cigarette abuse, and a family history of heart disease.  After obtaining informed consent, the patient was prepped and draped in  the usual sterile fashion exposing the right groin. Following local  anesthetic with 1% Xylocaine, the Seldinger technique was employed. A 5-  French introducer sheath was placed in the right femoral artery. Left  and right coronary arteriography and ventriculography in the RAO  projection was performed.  COMPLICATIONS: None.  EQUIPMENT: 5-French Judkins configuration catheters.  TOTAL CONTRAST: 65 mL.  RESULTS:  1. Hemodynamic monitoring. Her central aortic pressure was 104/68.  Her left ventricular pressure was 104/1. There was no significant  gradient noted at the time of pullback from the left ventricle into  the ascending aorta.  2. Ventriculography. Ventriculography in the RAO projection using 25  mL of contrast at 12 mL per second revealed the LV systolic  function to be normal to slightly hyperdynamic with an ejection  fraction in excess of 65%. No mitral regurgitation was  appreciated. Her left ventricular end-diastolic pressure was 7.  3. Coronary arteriography: No calcification in the distribution of  the coronary arteries were noted on fluoroscopy.  4. Left main was normal  and bifurcated.  5. Circumflex. The circumflex had minor irregularities in the  proximal portion and gave rise to 1 large OM vessel that was free  of disease.  6. LAD. The LAD extended down to, but did not reach the apex of the  heart. Just distal to the takeoff of first diagonal was an area of  30% narrowing with myocardial bridging. There was TIMI III flow  down the vessel. The first diagonal had minimal irregularities in  the ostium and otherwise was free of disease.  7. Right coronary artery. The right coronary artery had a tubular 30%  area of narrowing in its proximal segment with some minimal  irregularities in the mid distal portion of the vessel. The PDA  and posterior lateral vessel were free of disease.  CONCLUSION:  1. Normal LV systolic function.  2. Myocardial bridging in the mid LAD with minimal irregularities in  the LAD and right coronary artery and circumflex system.  I cannot explain her chest pain based on the above cath data. I did  discuss termination of her cigarette use and risk factor modification  with the patient. I did not restart her heparin, her arterial sheath  will be removed, and from a cardiac standpoint we will be happy to  reevaluate her as needed.  ______________________________  Thereasa Solo Little, M.D.

## 2012-07-07 NOTE — H&P (View-Only) (Signed)
Progress Notes     Patient: [name], [name] Provider: Traci Turner, MD  DOB: 05/31/1956 Age: 56 Y Sex: Female Date: 07/07/2012  Phone: [phone]   Address: [address], [place], [place]  Pcp: [name]       Subjective:     CC:    1. REFERRED DR [name] EVALUATE CP/Hospital visit on saturday.        HPI:  General:  The patient presents today for evaluation of chest pain. She states that last Saturday she developed chest pain at rest. The pain is described as a sharp aching sensation like a muscle spasm which comes and goes. It is nonexertional. It is located over her chest with radiation in to her neck, jaw and both arms. It occurs 4-6 times daily. She gets SOB with the pain and diaphoresis. She has a history of panic attacks with chest pain in 2010. She had a heart cath at that time which showed normal coronary arteries. She went to the ER this past weekend and workup was normal. She was given an prescription for hydrocodone which has helped with the pain. She was started on Lamictal and Pristiq a few months ago and since then has had night sweats and has not felt well. She has a history of fibromyalgia pain as well..        ROS:  See HPI, A twelve system review was perfomed at today's visit. For pertinent positives and negatives see HPI.       Medical History: Diabetes II, Fibromyalgia, history of multiple nodules on chest CT, seemingly resolved, hoarseness with negative ENT evaluation, Smoker, anxiety, depression ([place]), Chronic sinus problems, Increased Lipids (pt refuses medications), Hx of psoriasis rash when stressed, History of elevated white count, History of negative cardiac catheter 07/2008, History elevated liver functions, History chronic reflux, History chronic gingivitis.        Gyn History:        OB History:        Surgical History: tonsillectomy , pilonidal cyst .        Hospitalization/Major Diagnostic Procedure: childbirth  x 3 .        Family History: Father: deceased bone cancer, Insulin dependant Mother: deceased kidney failure, DM II Brother 1: alive h/o knee/hip replacement Brother2: alive h/o broken hip Sister 1: deceased bone and lung cancer Sister 2: alive Children: alive x 3, one with MS, other two DM I  no autoimmune disease. Daughter has MS.       Social History:  General:  History of smoking  cigarettes: Current smoker Frequency: 1.5 PPD for 40 years no Alcohol.  Caffeine: yes, 1 cup a day of espresso.  no Recreational drug use.  Diet: high protein.  no Exercise.  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  Marital Status: married.  Children: three.        Medications: OneTouch Ultra Test . Strip 1 test once daily as directed, OneTouch Delica Lancets . Miscellaneous 1 test once daily as directed, Pristiq 50 MG Tablet Extended Release 24 Hour 1 tablet Once a day, BusPIRone HCl 5 MG Tablet 1 Tablet Twice a day, Lamotrigine 100 MG Tablet 1 tablet Once a day, Kombiglyze XR 2.01-999 MG Tablet Extended Release 24 Hour 2 tablets Once a day, Soma 350 MG Tablet 1 tablet as needed Four times a day as needed for muscle pain, Vicodin 5-500 MG Tablet 1 tablet as needed for pain every 6 hrs, Cyclobenzaprine-Diet Manage Pr   10 MG Miscellaneous 1/2 tablet to 1 tablet Daily, Medication List reviewed and reconciled with the patient       Allergies: Chantix: chest pain and panic attacks , Cholesterol Medications: Increased pain levels: Side Effects, cymbalta : sleepy : Side Effects.       Objective:     Vitals: Wt 146.8, Wt change -3 lb, Ht 63.75, BMI 25.39, Pulse sitting 100, BP sitting 130/88.       Examination:  Cardiology, General:  GENERAL APPEARANCE: pleasant, NAD.  HEENT: unremarkable.  CAROTID UPSTROKE: normal, no bruit.  JVD: flat.  HEART SOUNDS: regular, normal S1, S2, no S3 or S4.  MURMUR: absent.  LUNGS: no rales or wheezes.  ABDOMEN: soft, non tender, positive  bowel sounds, no masses felt.  EXTREMITIES: no leg edema.  PERIPHERAL PULSES: 2 plus bilateral.        Assessment:     Assessment:  1. Unstable angina - 411.1 (Primary)  2. Tobacco abuse - 305.1  3. Fibromyalgia - 729.1  4. Depression, major - 296.20    Plan:     1. Unstable angina  Despite that fact that she has had a normal cath in 2009, she as DM and continues to smoke. I have recommended that she be admitted to rule out MI. Some of her symptoms appeared after starting lamictal and Pristiq. She also had fibromyalgia so this could be a flare of that. I will cycle her cardiac enzymes, start IV Heparin gtt, check 2D echo to assess LVF, check BNP due to SOB and LE edema. If cardiac enzymes and echo normal then will plan nuclear stress test in the am.        Immunizations:        Labs:        Preventive:        Provider: Traci Turner, MD  Patient: [name], [name] DOB: 04/30/1956 Date: 07/07/2012    

## 2012-07-07 NOTE — Progress Notes (Signed)
Please see Dr. Norris Cross H&P for full details. 56 year old female smoker who saw Dr. Mayford Knife earlier today in the clinic, had blood work drawn, elevated cardiac markers troponin of 3, MB of 25.  She is now here in the emergency department via EMS.  I have reviewed her prior notes from clinic.  She currently states that she is having mild to moderate chest pressure. I advised her that she proceed with cardiac catheterization on an urgent basis. She states that she had been through this before and at this point refused catheterization. I expressed to her and her husband sitting next to her that she is at increased risk for death.  They're both very angry and upset. I expressed to them that I was thankful that Dr. Mayford Knife had ordered the blood work and discovered a heart attack. They seemed to be more disgruntled about the situation then thankful. I carefully explained to them the clinical course. IV heparinization, nitroglycerin. Once again. They refuse to go forward with catheterization this evening against my medical advice.  Just before I arrived, she was speaking to the patient advocate because she was upset.   If she begins to have worsening chest discomfort or becomes unstable, I would hope that she would proceed with cardiac catheterization. For now, we will continue with aggressive medical management. I explained to her that on Monday, at the latest, we would perform angiography.   General: Upset but alert and oriented x3 Cardiovascular: Regular rate and rhythm without any murmurs rubs or gallops Lungs: Clear to auscultation bilaterally Abdomen: Soft positive bowel sounds, nontender Extremities: No significant edema appreciated. Positive pulses.

## 2012-07-07 NOTE — ED Notes (Signed)
[facility] advised the patient has been feeling ill since last Saturday went to [facility] had EKG and labs and negative.  She responded to her regular MD on Tuesday and it was negative again.  Today she still felt the "chest Pressure" or feels pain from her chest up her neck to her ear.  Patient is also coughing up "nasty" stuff and lungs are diminished.  Today, at her MD she had labs and her troponin was positive so they called EMS and was brought to ED.  Prior to arrival they attempted PIV x2, unsuccessful, and gave 324 of Aspirin. Was not given Nitro due to her not having access.

## 2012-07-08 ENCOUNTER — Encounter (HOSPITAL_COMMUNITY): Payer: Self-pay | Admitting: *Deleted

## 2012-07-08 DIAGNOSIS — M797 Fibromyalgia: Secondary | ICD-10-CM | POA: Diagnosis present

## 2012-07-08 DIAGNOSIS — F329 Major depressive disorder, single episode, unspecified: Secondary | ICD-10-CM | POA: Diagnosis present

## 2012-07-08 DIAGNOSIS — F17200 Nicotine dependence, unspecified, uncomplicated: Secondary | ICD-10-CM

## 2012-07-08 DIAGNOSIS — F17210 Nicotine dependence, cigarettes, uncomplicated: Secondary | ICD-10-CM | POA: Diagnosis present

## 2012-07-08 DIAGNOSIS — I2119 ST elevation (STEMI) myocardial infarction involving other coronary artery of inferior wall: Secondary | ICD-10-CM

## 2012-07-08 DIAGNOSIS — E1165 Type 2 diabetes mellitus with hyperglycemia: Secondary | ICD-10-CM | POA: Diagnosis present

## 2012-07-08 HISTORY — DX: ST elevation (STEMI) myocardial infarction involving other coronary artery of inferior wall: I21.19

## 2012-07-08 HISTORY — DX: Nicotine dependence, unspecified, uncomplicated: F17.200

## 2012-07-08 LAB — TSH: TSH: 0.907 u[IU]/mL (ref 0.350–4.500)

## 2012-07-08 LAB — PROTIME-INR: Prothrombin Time: 17.4 seconds — ABNORMAL HIGH (ref 11.6–15.2)

## 2012-07-08 LAB — BASIC METABOLIC PANEL
Chloride: 103 mEq/L (ref 96–112)
GFR calc Af Amer: >90 mL/min (ref >90)
GFR calc non Af Amer: >90 mL/min (ref >90)
Potassium: 4 mEq/L (ref 3.5–5.1)
Sodium: 138 mEq/L (ref 135–145)

## 2012-07-08 LAB — MRSA PCR SCREENING: MRSA by PCR: NEGATIVE

## 2012-07-08 LAB — CBC
Hemoglobin: 14.3 g/dL (ref 12.0–15.0)
MCHC: 34.3 g/dL (ref 30.0–36.0)
RDW: 13.1 % (ref 11.5–15.5)
WBC: 20.6 10*3/uL — ABNORMAL HIGH (ref 4.0–10.5)

## 2012-07-08 LAB — HEMOGLOBIN A1C: Hgb A1c MFr Bld: 6.8 % — ABNORMAL HIGH (ref <5.7)

## 2012-07-08 MED ORDER — SODIUM CHLORIDE 0.9 % IV SOLN: Freq: Once | INTRAVENOUS | Status: DC

## 2012-07-08 MED ORDER — ATORVASTATIN CALCIUM 80 MG PO TABS: 80.0000 mg | ORAL_TABLET | Freq: Every day | ORAL | Status: DC

## 2012-07-08 MED ORDER — DESVENLAFAXINE SUCCINATE ER 50 MG PO TB24: 50.0000 mg | ORAL_TABLET | Freq: Every day | ORAL | Status: DC

## 2012-07-08 MED ORDER — DESVENLAFAXINE SUCCINATE ER 50 MG PO TB24
50.0000 mg | ORAL_TABLET | Freq: Every day | ORAL | Status: DC
  Filled 2012-07-08: qty 1

## 2012-07-08 MED ORDER — PRASUGREL HCL 10 MG PO TABS: 10.0000 mg | ORAL_TABLET | Freq: Every day | ORAL | Status: DC

## 2012-07-08 MED ORDER — VENLAFAXINE HCL ER 75 MG PO CP24
75.0000 mg | ORAL_CAPSULE | Freq: Every day | ORAL | Status: DC
  Filled 2012-07-08 (×2): qty 1

## 2012-07-08 MED ORDER — INSULIN ASPART 100 UNIT/ML ~~LOC~~ SOLN: 2.0000 [IU] | SUBCUTANEOUS | Status: DC

## 2012-07-08 MED ORDER — BUSPIRONE HCL 5 MG PO TABS
5.0000 mg | ORAL_TABLET | Freq: Two times a day (BID) | ORAL | Status: DC
Start: 2012-07-08 — End: 2012-07-08
  Administered 2012-07-08: 5 mg via ORAL
  Filled 2012-07-08 (×2): qty 1

## 2012-07-08 MED ORDER — NITROGLYCERIN 0.4 MG SL SUBL: 0.4000 mg | SUBLINGUAL_TABLET | SUBLINGUAL | Status: DC | PRN

## 2012-07-08 NOTE — Progress Notes (Signed)
Smoking cessation information and Effient information packet given to pt and family.

## 2012-07-08 NOTE — Progress Notes (Signed)
Chaplain Note:  Chaplain visited with pt and pt's husband prior to pt being taken to cath lab.  During the procedure, chaplain provided spiritual comfort and support for cath lab staff, pt, and pt's husband. All expressed appreciation for chaplain support. Chaplain will follow up as needed.  07/07/12 1900  Clinical Encounter Type  Visited With Patient and family together  Visit Type Spiritual support  Referral From Other (Comment) (Code STEMI page)  Spiritual Encounters  Spiritual Needs Emotional  Stress Factors  Patient Stress Factors Major life changes;Health changes  Family Stress Factors Major life changes;Loss of control;Lack of knowledge   Verdie Shire, Chaplain [phone]

## 2012-07-08 NOTE — Progress Notes (Signed)
  Echocardiogram 2D Echocardiogram has been performed.  Jorje Guild 07/08/2012, 9:48 AM

## 2012-07-08 NOTE — Progress Notes (Signed)
Reviewed hospital policy regarding home meds and personal belongings with pt. This RN offered to lock up home meds per policy but pt declined and stated that her husband would take them home along with her bag of belongings later today. Pt resting comfortably at this time. No complaints.

## 2012-07-08 NOTE — Progress Notes (Addendum)
CARDIAC REHAB PHASE I   PRE:  Rate/Rhythm: Sinus Rhythm 89  BP:    Sitting: 98/56    SaO2: 97% on 2 liters Oxygen discontinued  MODE:  Ambulation:350 ft   POST:  Rate/Rhythm: Sinus tach 100  BP:    Sitting: 112/65     SaO2: 94% on Room Air  [id number]  Patient ambulated in the hallway with assistance times one no complaints voiced.  Patient given information regarding exercise instructions MI booklet stent card, sublingual nitroglycerin and when to call 911. Discussed smoking cessation, and patient previously had a quit date set for 07/23/12, pt seems ready to quit. Discussed Phase II Cardiac rehab with pt, but pt declines referral at this time.  Harlon Flor, Arta Bruce

## 2012-07-08 NOTE — Progress Notes (Addendum)
Despite extensive counseling, patient has decided to leave AGAINST MEDICAL ADVICE. She understands the risks of death, cardiovascular morbidity. Please see nursing note for details. I have given them a prescription for Effient. The importance of dual platelet therapy for one year has been expressed.

## 2012-07-08 NOTE — Progress Notes (Signed)
Subjective:  Reviewed notes from last evening. PCI to distal RCA DES (99%) Trop 19 She did not obey bed rest post cath. Groin c/d/i  Objective:  Vital Signs in the last 24 hours: Temp:  [97.6 F (36.4 C)-98.3 F (36.8 C)] 98 F (36.7 C) (10/12 0400) Pulse Rate:  [68-94] 75  (10/12 0700) Resp:  [5-21] 18  (10/12 0500) BP: (83-141)/(46-80) 89/58 mmHg (10/12 0700) SpO2:  [93 %-100 %] 93 % (10/12 0700) Arterial Line BP: (106-122)/(59-66) 119/65 mmHg (10/12 0100) Weight:  [64.6 kg (142 lb 6.7 oz)-66.2 kg (145 lb 15.1 oz)] 64.6 kg (142 lb 6.7 oz) (10/11 2150)  Intake/Output from previous day: 10/11 0701 - 10/12 0700 In: 561.3 [P.O.:300; I.V.:261.3] Out: 500 [Urine:500]   Physical Exam: General: Well developed, well nourished, in no acute distress. Head:  Normocephalic and atraumatic. Lungs: Clear to auscultation and percussion. Heart: Normal S1 and S2.  No murmur, rubs or gallops.  Abdomen: soft, non-tender, positive bowel sounds. Extremities: No clubbing or cyanosis. No edema. Groin c/d/i Neurologic: Alert and oriented x 3.    Lab Results:  Basename 07/08/12 0125 07/07/12 1937  WBC 20.6* 22.8*  HGB 14.3 15.8*  PLT 254 258    Basename 07/08/12 0125 07/07/12 1937  NA 138 140  K 4.0 4.7  CL 103 101  CO2 23 27  GLUCOSE 155* 150*  BUN 12 15  CREATININE 0.49* 0.61    Basename 07/08/12 0125 07/07/12 1938  TROPONINI 19.11* 3.79*   Hepatic Function Panel  Basename 07/07/12 1937  PROT 7.4  ALBUMIN 4.2  AST 48*  ALT 28  ALKPHOS 73  BILITOT 0.2*  BILIDIR --  IBILI --    Imaging: Dg Chest 2 View  07/07/2012  *RADIOLOGY REPORT*  Clinical Data: Intermittent chest pain.  Productive cough.  Recent myocardial infarction.  Tobacco use.  CHEST - 2 VIEW  Comparison: 07/01/2012  Findings: Lower cervical spondylosis noted.  Faintly prominent interstitium is common in smokers.  The lungs appear otherwise clear. Cardiac and mediastinal contours appear unremarkable.  No  pleural effusion observed.  IMPRESSION:  1.  Faintly prominent interstitium, characteristic of smokers. Otherwise the lungs appear clear. 2.  Lower cervical spondylosis.   Original Report Authenticated By: Dellia Cloud, M.D.    Personally viewed.   Telemetry: No VT, normal rhythm Personally viewed.   EKG:  Inferior Q waves, SR TWI L  Cardiac Studies:  Cath 99% RCA--DES  Assessment/Plan:  Principal Problem:  *Acute MI, inferior wall, initial episode of care Active Problems:  Tobacco use disorder  Fibromyalgia  Depression  Type II or unspecified type diabetes mellitus without mention of complication, not stated as uncontrolled  -INF MI - Feels better. ECHO. Cath reviewed -DM- ISS - holding metformin -Depression - Pristiq ordered, she states she can not take effexor -Tob cessation  -Hypotension - hold metoprolol  -Leukocytosis - 20. No fever, no cough, likely secondary to MI Monitor for any changes  -Hopeful transfer to [facility] tomorrow.     [name] 07/08/2012, 8:05 AM

## 2012-07-08 NOTE — Progress Notes (Signed)
Pt and husband state that they want to leave hospital now. This RN reviewed the risks involved in leaving the hospital without receiving further care. Dr Anne Fu notified and will be up shortly to give family effient prescription. PIV d/c'd. Husband and son in room with pt. AMA form signed.

## 2012-07-08 NOTE — Progress Notes (Signed)
Effient and asa prescription given to pt by Dr Anne Fu. Pt declined assistance out of hospital. Left AMA at this time.

## 2012-07-08 NOTE — Progress Notes (Signed)
Prior to pulling sheath, observed pt's sbp 80's, On call MD (Dr. Antoine Poche) notified. New order received for NS Bolus and given as ordered. Will cont to monitor.   Post sheath instructions given to pt prior to and after discontinuing the sheath. Pt vu. Vital sign stable during pull (sbp 90's). Pt conversing with RN through pull. Manual pressure held to site for 15 min, site soft, no hematoma with palpable pulses. Pressure drsg appiled to site. Will cont to monitor closely.    Pt states she needs to urinate prior to taking sheath out, pt was offered bedpan, but declined and stated she will get up once the sheath is out to use bathroom. This RN explained to pt that post d/c of sheath, she will be on bedrest for 4 h. Pt got annoyed and stated she can not use the bedpan, will not be humiliated in this manner and was not told this was going to be the case. This RN apologized for the miscommunication but reiterated the complications that can happen as a result of getting up too soon. Pt stated that she is well aware of bleeding out. Pt was also offered female urinal but pt declined. Urinal left in room, will cont to monitor closely.

## 2012-07-08 NOTE — Progress Notes (Signed)
Pt assisted to BR and back to bed. Rt groin remains c/d/i  Will cont to monitor.

## 2012-07-08 NOTE — Progress Notes (Signed)
Pt agreed to have blood sugar checked this am with a result of 170; however, pt refused SS insulin. This RN educated pt that due to her home medication regimen modification that it is important to use SQ insulin to maintain her sugar levels within normal limits. Pt adamantly refused and stated that she does not want this RN to continue checking her blood sugar levels. Pt continued and stated that "she can take care of herself." Pt resting in bed at this time eating breakfast. Will continue to monitor and assist pt as able.

## 2012-07-10 MED FILL — Dextrose Inj 5%: INTRAVENOUS | Qty: 50 | Status: AC

## 2012-07-11 LAB — POCT ACTIVATED CLOTTING TIME: Activated Clotting Time: 649 seconds

## 2012-07-12 NOTE — Discharge Summary (Signed)
Patient left AGAINST MEDICAL ADVICE  She was diagnosed with non-ST elevation myocardial infarction with subsequent catheterization, stent placement to distal RCA relieving a 99% stenosis. Troponin was  elevated at 19.  On morning she left, she did not state that she was having any chest discomfort. It was clearly explained to her that plan was to continue to monitor because her cardiac markers have been elevated and that she just had a non-ST elevation myocardial infarction, stent placement and we want to observe for any possible arrhythmias/ post MI issues per standard of care  As I was rounding on my other patients, I was called by her nurse at the time stating that she wished to leave AMA. The day after she left AGAINST MEDICAL ADVICE I called her. She did not answer her phone but I did leave a voicemail with detailed instructions on the importance once again of taking dual antiplatelet therapy aspirin, Effient to prevent stent thrombosis. I did send a one-year prescription for this medication to her pharmacy via epic.  To summarize once again, she was seen Friday afternoon in clinic by Dr. Mayford Knife and because of her worrisome history was asked to be admitted to the hospital. It was explained to her that she would be placed on heparin and would undergo stress test in the morning if cardiac markers were normal. When nursing staff returned to the room, she refused hospitalization. Dr. Mayford Knife was notified and asked her to at least check cardiac biomarkers which she did. Her troponin was elevated as well as MB quite significantly. Nursing staff then called to make her aware of this and to request that she return to the emergency department for further evaluation. According to nursing staff, she advised her to chew an aspirin when she got home and that she would call back in 5 minutes to verify that she was there before calling an ambulance. 5 minutes later, she tried to call her cell phone twice but she would  not pick up. She then called the home number and the husband answered. He stated that he thought he was suspicious that after they came in today and we "tried to force him to the hospital "and they refuse to go, instead choosing to have labs drawn and a stress test for next week, that now we were calling saying that they had to turn around and go to the hospital because of elevated markers in effort to force them again. He stated that he didn't know they could believe with we were saying and he implied that he thought that we were making it up. She tried to explain to him once more that we requested the cardiac markers stat when they refused admission and they have come back showing that she is having a myocardial infarction. He stated again that he thought we were making this up. It was clearly stated that "we would not play these kind of games "and that she needed to go to the hospital right now. He argued on the phone and stated that he wasn't sure that he was going to call an ambulance. He said that we were threatening him by forcing an ambulance. It was stated that we were trying to save his wife's life. He began yelling and was angry. He said that he would let her go on an ambulance but that he wanted a copy of those labs and if they were not elevated that he was coming up here after each and every one of [place]. He  hung up the phone abruptly. Our nursing staff called 911 and had an ambulance dispatched. We even had Dr. Zachery Dauer her PCP call them to encourage her hospitalization.   In the emergency department as I was about to enter her room, the patient relations representative was leaving and stated that she was upset that she was unable to drink any water and that no one was explaining what was going on to her.  I entered her room and discussed her case at length, discussed the implications of myocardial infarction including death and discussed an action plan Including IV heparinization, IV nitroglycerin and  cardiac catheterization. The husband was present in that time showed very aggressive body language and seemed quite angry. She was laying in bed and did not say much but appeared angry. I called we tried to explain to them that I was happy that they were there and that I was thankful that Dr. Mayford Knife checked the cardiac markers on her. She then proceeded to state that she would never thank her for anything in quite angry tone. I once again explained what we would do during this hospitalization. At the end, her husband asked me what I was going to do about her current chest pain. At that point, I stated that with ongoing chest discomfort, elevated cardiac markers I would like for her to proceed with cardiac catheterization on an urgent basis, sooner rather than later. She adamantly refused.  I then came back and reassessed her when she was alone and she stated that she was having more chest pressure and pain and was quite worried. She seemed more calm. I once again suggested that we proceed with cardiac catheterization and she was willing to proceed at that point. I carefully explained the risks and benefits of the procedure, how the procedure would take place, and that my interventional cardiology colleague would be performing the procedure. I gave her ample time for questioning.  I also saw husband and hallway by himself and he seemed more calm and more concerned about her. I explained to him that I was glad she was proceeding with cardiac catheterization given her increasing anginal discomfort/NST elevation myocardial infarction. He understood. I went up to cath lab to personally discuss case with interventional colleague.  After catheterization, she refused to comply with bedrest orders and got up to go to the bathroom shortly after hemostasis. Groin site that next morning was stable with no evidence of hematoma.  As is stated above, later that morning, she left AMA.

## 2012-07-24 ENCOUNTER — Telehealth: Payer: Self-pay | Admitting: Oncology

## 2012-07-24 NOTE — Telephone Encounter (Signed)
S/W PT IN REF TO NP APPT. ON 08/14/12@3 :00 REFERRING DR Zachery Dauer DX-ELEVATED WBC MAILED NP PACKET

## 2012-07-24 NOTE — Telephone Encounter (Signed)
C/D 07/24/12 for appt. 08/14/12

## 2012-08-14 ENCOUNTER — Ambulatory Visit: Payer: 59

## 2012-08-14 ENCOUNTER — Other Ambulatory Visit: Payer: 59 | Admitting: Lab

## 2012-08-14 ENCOUNTER — Encounter: Payer: Self-pay | Admitting: Oncology

## 2012-08-14 ENCOUNTER — Ambulatory Visit: Payer: 59 | Admitting: Oncology

## 2012-08-14 DIAGNOSIS — D72821 Monocytosis (symptomatic): Secondary | ICD-10-CM | POA: Insufficient documentation

## 2012-08-14 DIAGNOSIS — D72829 Elevated white blood cell count, unspecified: Secondary | ICD-10-CM | POA: Insufficient documentation

## 2012-08-14 NOTE — Progress Notes (Signed)
No show.  Reschedule prn.  

## 2012-08-23 ENCOUNTER — Encounter: Payer: Self-pay | Admitting: *Deleted

## 2012-08-23 ENCOUNTER — Other Ambulatory Visit: Payer: 59 | Admitting: Lab

## 2012-08-23 ENCOUNTER — Encounter: Payer: Self-pay | Admitting: Oncology

## 2012-08-23 ENCOUNTER — Ambulatory Visit: Payer: 59 | Admitting: Oncology

## 2012-08-23 ENCOUNTER — Ambulatory Visit: Payer: 59

## 2012-08-23 ENCOUNTER — Other Ambulatory Visit: Payer: Self-pay | Admitting: *Deleted

## 2012-08-23 NOTE — Progress Notes (Signed)
Checked in new patient. No financial issues. She does not want to do the lab. I advised her she would have to speak with them to see if can wait till after the dr visit. She is really upset about why she is here.

## 2012-08-23 NOTE — Progress Notes (Signed)
Pt was in lobby prior to new pt visit w/ Dr. Gaylyn Rong.  She told Lab tech she wants to know what labs and why before having them done.  This RN went out and spoke w/ pt.. Attempted to explain reason for labs and reason for referral.  Pt verbalized she doesn't understand why things were not explained to her.  Informed pt that the referring physician usually explains reason for referral and that Dr. Gaylyn Rong will answer all her questions when she comes back to office.  Pt voiced she was not told [facility].  Explained clearly to pt that cancer (leukemia) is a diagnosis that needs to be ruled out due to her elevated WBC.  Pt then said she would go to the lab but continued to complain and said we were trying to bill her for procedures before they were explained to her.  Informed pt she has option of not having labs and she said she will take that option.  She canceled her appt and said she will f/u w/ Dr. Zachery Dauer.  Encouraged pt to speak w/ Dr. Zachery Dauer and assured her if she needs to be re scheduled to see Dr. Gaylyn Rong that he would explain everything to her and answer her questions when he has opportunity to see her.  Pt verbalized understanding and Dr. Gaylyn Rong notified.

## 2012-09-13 ENCOUNTER — Ambulatory Visit (INDEPENDENT_AMBULATORY_CARE_PROVIDER_SITE_OTHER): Payer: 59 | Admitting: Family

## 2012-09-13 ENCOUNTER — Encounter: Payer: Self-pay | Admitting: Family

## 2012-09-13 VITALS — BP 150/88 | HR 93 | Temp 98.1°F | Ht 63.5 in | Wt 151.0 lb

## 2012-09-13 DIAGNOSIS — E785 Hyperlipidemia, unspecified: Secondary | ICD-10-CM

## 2012-09-13 DIAGNOSIS — IMO0001 Reserved for inherently not codable concepts without codable children: Secondary | ICD-10-CM

## 2012-09-13 DIAGNOSIS — E119 Type 2 diabetes mellitus without complications: Secondary | ICD-10-CM

## 2012-09-13 DIAGNOSIS — F411 Generalized anxiety disorder: Secondary | ICD-10-CM

## 2012-09-13 DIAGNOSIS — F419 Anxiety disorder, unspecified: Secondary | ICD-10-CM

## 2012-09-13 DIAGNOSIS — F329 Major depressive disorder, single episode, unspecified: Secondary | ICD-10-CM

## 2012-09-13 DIAGNOSIS — M797 Fibromyalgia: Secondary | ICD-10-CM

## 2012-09-13 LAB — CBC WITH DIFFERENTIAL/PLATELET
Basophils Absolute: 0.1 10*3/uL (ref 0.0–0.1)
Basophils Relative: 0.4 % (ref 0.0–3.0)
Eosinophils Absolute: 0.5 10*3/uL (ref 0.0–0.7)
Lymphocytes Relative: 15.6 % (ref 12.0–46.0)
MCHC: 33 g/dL (ref 30.0–36.0)
Neutrophils Relative %: 74.1 % (ref 43.0–77.0)
Platelets: 304 10*3/uL (ref 150.0–400.0)
RBC: 5.14 Mil/uL — ABNORMAL HIGH (ref 3.87–5.11)

## 2012-09-13 LAB — COMPREHENSIVE METABOLIC PANEL
ALT: 27 U/L (ref 0–35)
AST: 28 U/L (ref 0–37)
Albumin: 4.5 g/dL (ref 3.5–5.2)
Alkaline Phosphatase: 78 U/L (ref 39–117)
BUN: 19 mg/dL (ref 6–23)
Calcium: 10.2 mg/dL (ref 8.4–10.5)
Chloride: 106 mEq/L (ref 96–112)
Potassium: 5.6 mEq/L — ABNORMAL HIGH (ref 3.5–5.1)
Sodium: 142 mEq/L (ref 135–145)
Total Protein: 8 g/dL (ref 6.0–8.3)

## 2012-09-13 LAB — LIPID PANEL: Total CHOL/HDL Ratio: 4

## 2012-09-13 LAB — LDL CHOLESTEROL, DIRECT: Direct LDL: 151.1 mg/dL

## 2012-09-13 LAB — TSH: TSH: 0.69 u[IU]/mL (ref 0.35–5.50)

## 2012-09-13 MED ORDER — SAXAGLIPTIN-METFORMIN ER 2.5-1000 MG PO TB24: 2.0000 | ORAL_TABLET | Freq: Every day | ORAL | Status: DC

## 2012-09-13 MED ORDER — PITAVASTATIN CALCIUM 2 MG PO TABS: 2.0000 mg | ORAL_TABLET | Freq: Every day | ORAL | Status: DC

## 2012-09-13 MED ORDER — CYCLOBENZAPRINE HCL 10 MG PO TABS: 10.0000 mg | ORAL_TABLET | Freq: Every evening | ORAL | Status: DC | PRN

## 2012-09-13 NOTE — Patient Instructions (Signed)

## 2012-09-13 NOTE — Progress Notes (Signed)
Subjective:    Patient ID: [name], female    DOB: 12/24/1955, 56 y.o.   MRN: [id number]  HPI 56 year old white female, one half pack per day smoker, the patient to the practice is in to be established. She has a history of type 2 diabetes, anxiety, depression, fibromyalgia, hyperlipidemia, and back pain. She does not routinely check her blood sugars. She had been seeing equal physicians and was recently discharged for refusing to take medications at the cardiologist suggested. She recently had a heart attack 07/07/2012. She continues to not exercise. Patient has been refusing statin medications. She also refuses a flu shot, mammogram, and Pap smears. She reports her last hemoglobin A1c being 6.7. She's tolerating Kombiglyze well.    Review of Systems  Constitutional: Negative.   HENT: Negative.   Respiratory: Negative.   Cardiovascular: Negative.   Gastrointestinal: Negative.   Genitourinary: Negative.   Musculoskeletal: Positive for myalgias and back pain.  Skin: Negative.   Neurological: Negative.   Hematological: Negative.   Psychiatric/Behavioral: Positive for sleep disturbance. Negative for suicidal ideas and self-injury. The patient is nervous/anxious.    Past Medical History  Diagnosis Date  . Diabetes mellitus   . Fibromyalgia   . Coronary artery disease   . Anxiety   . Depression   . Asthma   . Acute MI, inferior wall, initial episode of care 07/08/2012    Cath-07/08/11 -distal RCA stent DES (99%), LAD 20-30%. EF 50% inferior hypokinesis  (infarct)  . Tobacco use disorder 07/08/2012  . Leukocytosis   . Monocytosis     History   Social History  . Marital Status: Married    Spouse Name: N/A    Number of Children: N/A  . Years of Education: N/A   Occupational History  . Not on file.   Social History Main Topics  . Smoking status: Current Every Day Smoker -- 1.5 packs/day  . Smokeless tobacco: Not on file  . Alcohol Use: Yes     Comment: Occasionally   . Drug Use: No  . Sexually Active: Yes   Other Topics Concern  . Not on file   Social History Narrative  . No narrative on file    Past Surgical History  Procedure Date  . Cardiac catheterization     No family history on file.  No Known Allergies  Current Outpatient Prescriptions on File Prior to Visit  Medication Sig Dispense Refill  . aspirin 81 MG tablet Take 81 mg by mouth 4 (four) times daily.      . busPIRone (BUSPAR) 5 MG tablet Take 5 mg by mouth 2 (two) times daily.      Marland Kitchen desvenlafaxine (PRISTIQ) 50 MG 24 hr tablet Take 50 mg by mouth at bedtime.       Marland Kitchen HYDROcodone-acetaminophen (VICODIN) 5-500 MG per tablet Take 1-2 tablets by mouth every 6 (six) hours as needed for pain.  20 tablet  0  . nitroGLYCERIN (NITROSTAT) 0.4 MG SL tablet Place 1 tablet (0.4 mg total) under the tongue every 5 (five) minutes x 3 doses as needed for chest pain.  30 tablet  12  . prasugrel (EFFIENT) 10 MG TABS Take 1 tablet (10 mg total) by mouth daily.  30 tablet  12  . Saxagliptin-Metformin (KOMBIGLYZE XR) 2.01-999 MG TB24 Take 2 tablets by mouth at bedtime.  60 tablet  3  . atorvastatin (LIPITOR) 80 MG tablet Take 1 tablet (80 mg total) by mouth daily at 6 PM.  30 tablet  12  .  carisoprodol (SOMA) 350 MG tablet Take 350 mg by mouth 4 (four) times daily as needed. Muscle pain      . ibuprofen (ADVIL,MOTRIN) 200 MG tablet Take 200 mg by mouth every 6 (six) hours as needed. For pain      . naproxen sodium (ANAPROX) 220 MG tablet Take 220 mg by mouth daily as needed. For pain        BP 150/88  Pulse 93  Temp 98.1 F (36.7 C) (Oral)  Ht 5' 3.5" (1.613 m)  Wt 151 lb (68.493 kg)  BMI 26.33 kg/m2  SpO2 98%chart    Objective:   Physical Exam  Constitutional: She is oriented to person, place, and time. She appears well-developed and well-nourished.  HENT:  Left Ear: External ear normal.  Nose: Nose normal.  Mouth/Throat: Oropharynx is clear and moist.  Neck: Normal range of motion. Neck  supple. No thyromegaly present.  Cardiovascular: Normal rate, regular rhythm and normal heart sounds.   Pulmonary/Chest: Effort normal and breath sounds normal.  Abdominal: Soft. Bowel sounds are normal.  Musculoskeletal: Normal range of motion.  Neurological: She is alert and oriented to person, place, and time. She has normal reflexes. No cranial nerve deficit. Coordination normal.  Skin: Skin is warm and dry.  Psychiatric: She has a normal mood and affect.          Assessment & Plan:  Assessment: Type 2 diabetes, Hyperlipidemia, Anxiety, Depression, Fibromyalgia, Back pain, history of Myocardial Infarction  Plan: Suggested Livalo and fax a prescription over to the pharmacy for her to try a statin type medication despite her fibromyalgia. Discussed the risks of not controlling her cholesterol factoring in her smoking and lack of exercise. She's also had a heart attack. Patient verbalizes understanding and accepts the risk. I will refer her to cardiology for any recommendations that they may have considering she had heart image recently. Encouraged healthy diet, exercise, smoking cessation, mammogram, Pap smear, flu shot the patient is declining them all. Lab sent to include BMP, LFTs, CBC, A1c, and lipids. Will notify patient pending results and discuss further treatment plan at that time. Recheck in 3 months and sooner when necessary.

## 2012-09-14 ENCOUNTER — Telehealth: Payer: Self-pay | Admitting: Family

## 2012-09-14 ENCOUNTER — Other Ambulatory Visit: Payer: Self-pay

## 2012-09-14 MED ORDER — SODIUM POLYSTYRENE SULFONATE PO POWD: Freq: Once | ORAL | Status: DC

## 2012-09-14 NOTE — Telephone Encounter (Signed)
Patient called nurse back

## 2012-09-14 NOTE — Telephone Encounter (Signed)
One dose of kayexalate po x 1.

## 2012-09-14 NOTE — Telephone Encounter (Signed)
Spoke with pt about lab results. Pt concerned with potassium level, stating that she does not take a potassium supplement or have a diet high in potassium. She says that she may eat a banana once a week but that's it. Pt would like to know what she can do to decrease potassium level. Please advise

## 2012-09-15 ENCOUNTER — Telehealth: Payer: Self-pay | Admitting: Family

## 2012-09-15 NOTE — Telephone Encounter (Signed)
Caller: Pharmacist/; Phone: [phone]; Reason for Call: Pharmacy received Rx for kayexalate po x 1.  States comes in a bottle, and pharmacist wants to know if patient is to take entire bottle, or just one 4-oz dose?  Per Epic, instructions 09/14/12 from Ms.  Campbell state kayexalate, 1 dose x 1.  Advised; will dispense as ordered.  No further questions or concerns.  Krs/can

## 2012-09-15 NOTE — Telephone Encounter (Signed)
Pt aware.

## 2012-10-04 ENCOUNTER — Encounter: Payer: Self-pay | Admitting: Cardiology

## 2012-10-04 ENCOUNTER — Ambulatory Visit (INDEPENDENT_AMBULATORY_CARE_PROVIDER_SITE_OTHER): Payer: 59 | Admitting: Cardiology

## 2012-10-04 VITALS — BP 142/78 | HR 90 | Ht 63.5 in | Wt 151.0 lb

## 2012-10-04 DIAGNOSIS — I2119 ST elevation (STEMI) myocardial infarction involving other coronary artery of inferior wall: Secondary | ICD-10-CM

## 2012-10-04 DIAGNOSIS — M79609 Pain in unspecified limb: Secondary | ICD-10-CM

## 2012-10-04 DIAGNOSIS — F17200 Nicotine dependence, unspecified, uncomplicated: Secondary | ICD-10-CM

## 2012-10-04 DIAGNOSIS — M79606 Pain in leg, unspecified: Secondary | ICD-10-CM

## 2012-10-04 DIAGNOSIS — E119 Type 2 diabetes mellitus without complications: Secondary | ICD-10-CM

## 2012-10-04 MED ORDER — CLOPIDOGREL BISULFATE 75 MG PO TABS: 75.0000 mg | ORAL_TABLET | Freq: Every day | ORAL | Status: DC

## 2012-10-04 MED ORDER — METOPROLOL TARTRATE 25 MG PO TABS: 12.5000 mg | ORAL_TABLET | Freq: Two times a day (BID) | ORAL | Status: DC

## 2012-10-04 MED ORDER — ASPIRIN 81 MG PO TABS: 81.0000 mg | ORAL_TABLET | Freq: Two times a day (BID) | ORAL | Status: DC

## 2012-10-04 NOTE — Progress Notes (Signed)
HPI The patient presents for followup after recent hospitalization with a non-Q-wave myocardial infarction. I have reviewed his records extensively. She had some confrontation with her physicians and did leave AMA after her heart catheterization. She had a non-Q-wave MI presenting with arm pain. She was found to have 20% proximal stenosis and 30% mid stenosis in the LAD. The circumflex had 20% proximal stenosis. The right coronary artery and 20% stenosis with distal vessel 99% stenosis. The patient underwent PTCA and stenting of the distal RCA. She said that since that time she's had no recurrent arm pain. She walks extensively and she doesn't describe discomfort starting in her hips down to her thighs bilaterally with walking. She walks at work. She does have some chronic dyspnea but no acute shortness of breath, PND or orthopnea. She's not having any new palpitations, presyncope or syncope. She's had no weight gain or edema. She unfortunately continues to smoke cigarettes. She has been dismissed from all people practices. She is establishing with a new primary physician and notes that her blood sugars have been running slightly up.  She hopes to see an endocrinologist.  Allergies  Allergen Reactions  . Statins     SEVERE MUSCLE PAIN    Current Outpatient Prescriptions  Medication Sig Dispense Refill  . aspirin 81 MG tablet Take 81 mg by mouth 4 (four) times daily.      . busPIRone (BUSPAR) 5 MG tablet Take 5 mg by mouth 2 (two) times daily.      . cyclobenzaprine (FLEXERIL) 10 MG tablet Take 1 tablet (10 mg total) by mouth at bedtime as needed.  30 tablet  3  . desvenlafaxine (PRISTIQ) 50 MG 24 hr tablet Take 50 mg by mouth at bedtime.       Marland Kitchen HYDROcodone-acetaminophen (VICODIN) 5-500 MG per tablet Take 1-2 tablets by mouth every 6 (six) hours as needed for pain.  20 tablet  0  . nitroGLYCERIN (NITROSTAT) 0.4 MG SL tablet Place 1 tablet (0.4 mg total) under the tongue every 5 (five) minutes x 3  doses as needed for chest pain.  30 tablet  12  . ONE TOUCH ULTRA TEST test strip       . ONETOUCH DELICA LANCETS MISC       . Pitavastatin Calcium 2 MG TABS Take 1 tablet (2 mg total) by mouth at bedtime.  30 tablet  3  . prasugrel (EFFIENT) 10 MG TABS Take 1 tablet (10 mg total) by mouth daily.  30 tablet  12  . Saxagliptin-Metformin (KOMBIGLYZE XR) 2.01-999 MG TB24 Take 2 tablets by mouth at bedtime.  60 tablet  3  . lamoTRIgine (LAMICTAL) 100 MG tablet Take 100 mg by mouth 2 (two) times daily.       . sodium polystyrene (KAYEXALATE) powder Take by mouth once.  454 g  0    Past Medical History  Diagnosis Date  . Diabetes mellitus   . Fibromyalgia   . Coronary artery disease   . Anxiety   . Depression   . Asthma   . Acute MI, inferior wall, initial episode of care 07/08/2012    Cath-07/08/11 -distal RCA stent DES (99%), LAD 20-30%. EF 50% inferior hypokinesis  (infarct)  . Tobacco use disorder 07/08/2012  . Leukocytosis   . Monocytosis     Past Surgical History  Procedure Date  . Cardiac catheterization     ROS:  As stated in the HPI and negative for all other systems.  PHYSICAL EXAM BP  142/78  Pulse 90  Ht 5' 3.5" (1.613 m)  Wt 151 lb (68.493 kg)  BMI 26.33 kg/m2  SpO2 98% GENERAL:  Well appearing HEENT:  Pupils equal round and reactive, fundi not visualized, oral mucosa unremarkable NECK:  No jugular venous distention, waveform within normal limits, carotid upstroke brisk and symmetric, no bruits, no thyromegaly LYMPHATICS:  No cervical, inguinal adenopathy LUNGS:  Clear to auscultation bilaterally BACK:  No CVA tenderness CHEST:  Unremarkable HEART:  PMI not displaced or sustained,S1 and S2 within normal limits, no S3, no S4, no clicks, no rubs, no murmurs ABD:  Flat, positive bowel sounds normal in frequency in pitch, no bruits, no rebound, no guarding, no midline pulsatile mass, no hepatomegaly, no splenomegaly EXT:  2 plus pulses upper, 1+ dorsalis pedis  bilateral, absent posterior tibialis bilateral, no edema, no cyanosis no clubbing SKIN:  No rashes no nodules NEURO:  Cranial nerves II through XII grossly intact, motor grossly intact throughout PSYCH:  Cognitively intact, oriented to person place and time   ASSESSMENT AND PLAN  CAD  I reviewed extensively her hospital records. She's having now the pain was her previous angina. We discussed primary risk reduction. I will switch her to Plavix at her request. She will continue on aspirin. I will start her on a low dose of beta blocker metoprolol 12 have milligrams twice a day. She understands the importance of secondary risk reduction.  Leg pain I will set her up for ABIs. Further evaluation and management will be based on these results.  Tobacco abuse She is unable to stop smoking and we did discuss this.  Dyslipidemia She has been intolerant of medications other than Pitavistatin.  She will continue on this medicine and I will followup with a lipid profile. Her last LDL was 151.

## 2012-10-04 NOTE — Patient Instructions (Addendum)
Please stop your Effient Start ASA 81 mg twice a day Start Plavix 75 a day. Start Metoprolol 25 mg 1/2 tablet twice a day.  Your physician has requested that you have a lower extremity arterial exercise duplex. During this test, exercise and ultrasound are used to evaluate arterial blood flow in the legs. Allow one hour for this exam. There are no restrictions or special instructions.  Follow up in 2 months with Dr Antoine Poche.

## 2012-10-10 ENCOUNTER — Encounter (INDEPENDENT_AMBULATORY_CARE_PROVIDER_SITE_OTHER): Payer: 59

## 2012-10-10 DIAGNOSIS — I70219 Atherosclerosis of native arteries of extremities with intermittent claudication, unspecified extremity: Secondary | ICD-10-CM

## 2012-10-10 DIAGNOSIS — I739 Peripheral vascular disease, unspecified: Secondary | ICD-10-CM

## 2012-10-10 DIAGNOSIS — M79606 Pain in leg, unspecified: Secondary | ICD-10-CM

## 2012-10-31 ENCOUNTER — Telehealth: Payer: Self-pay | Admitting: Cardiology

## 2012-10-31 NOTE — Telephone Encounter (Signed)
Pt rtn call to get test results it is hard to reach her during the day and she can be reached at about 6-7 pm at night

## 2012-11-21 ENCOUNTER — Encounter: Payer: Self-pay | Admitting: *Deleted

## 2012-11-21 NOTE — Telephone Encounter (Signed)
Letter of results mailed to pts home address as I have not been able to contact her by phone.

## 2012-11-23 ENCOUNTER — Telehealth: Payer: Self-pay | Admitting: Cardiology

## 2012-11-23 NOTE — Telephone Encounter (Signed)
Pt aware of results of ABI's and she was scheduled for an appt on March 24 at 4 pm

## 2012-11-23 NOTE — Telephone Encounter (Signed)
Follow-up:    Patient called in to follow-up on the call she had placed on 10/31/12.  Please call back.

## 2012-11-29 ENCOUNTER — Ambulatory Visit: Payer: 59 | Admitting: Cardiology

## 2012-12-18 ENCOUNTER — Ambulatory Visit: Payer: 59 | Admitting: Cardiology

## 2012-12-26 ENCOUNTER — Ambulatory Visit (INDEPENDENT_AMBULATORY_CARE_PROVIDER_SITE_OTHER): Payer: 59 | Admitting: Family

## 2012-12-26 ENCOUNTER — Encounter: Payer: Self-pay | Admitting: Family

## 2012-12-26 VITALS — BP 160/100 | HR 91 | Temp 98.2°F | Wt 153.0 lb

## 2012-12-26 DIAGNOSIS — I10 Essential (primary) hypertension: Secondary | ICD-10-CM

## 2012-12-26 DIAGNOSIS — J0100 Acute maxillary sinusitis, unspecified: Secondary | ICD-10-CM

## 2012-12-26 DIAGNOSIS — F17200 Nicotine dependence, unspecified, uncomplicated: Secondary | ICD-10-CM

## 2012-12-26 DIAGNOSIS — Z720 Tobacco use: Secondary | ICD-10-CM

## 2012-12-26 MED ORDER — CLINDAMYCIN HCL 300 MG PO CAPS: 300.0000 mg | ORAL_CAPSULE | Freq: Three times a day (TID) | ORAL | Status: DC

## 2012-12-26 NOTE — Progress Notes (Signed)
Subjective:    Patient ID: [name], female    DOB: 02-03-1956, 57 y.o.   MRN: [id number]  HPI 57 year old white female, one pack per day smoker is in today with complaints of sneezing, cough, congestion, sinus pressure and pain x2 months but has recently worsened. She's been taken Sudafed that helps her symptoms but never clears them. She has a history of chronic sinusitis in the past that is resistant to amoxicillin and doxycycline per patient   Review of Systems  Constitutional: Negative.   HENT: Positive for nosebleeds, congestion, sore throat, rhinorrhea, postnasal drip and sinus pressure.   Respiratory: Positive for cough.   Cardiovascular: Negative.   Gastrointestinal: Negative.   Musculoskeletal: Negative.   Allergic/Immunologic: Positive for environmental allergies.  Neurological: Negative.   Psychiatric/Behavioral: Negative.    Past Medical History  Diagnosis Date  . Diabetes mellitus   . Fibromyalgia   . Coronary artery disease   . Anxiety   . Depression   . Asthma   . Acute MI, inferior wall, initial episode of care 07/08/2012    Cath-07/08/11 -distal RCA stent DES (99%), LAD 20-30%. EF 50% inferior hypokinesis  (infarct)  . Tobacco use disorder 07/08/2012  . Leukocytosis   . Monocytosis     History   Social History  . Marital Status: Married    Spouse Name: N/A    Number of Children: 3  . Years of Education: N/A   Occupational History  .  [employer]   Social History Main Topics  . Smoking status: Current Every Day Smoker -- 1.50 packs/day  . Smokeless tobacco: Not on file  . Alcohol Use: Yes     Comment: Occasionally  . Drug Use: No  . Sexually Active: Yes   Other Topics Concern  . Not on file   Social History Narrative   Lives with husband.      Past Surgical History  Procedure Laterality Date  . Cardiac catheterization      Family History  Problem Relation Age of Onset  . Diabetes Mother     Allergies  Allergen  Reactions  . Statins     SEVERE MUSCLE PAIN  . Ace Inhibitors Cough    Patient refuses to take meds    Current Outpatient Prescriptions on File Prior to Visit  Medication Sig Dispense Refill  . aspirin 81 MG tablet Take 1 tablet (81 mg total) by mouth 2 (two) times daily.      . busPIRone (BUSPAR) 5 MG tablet Take 5 mg by mouth 2 (two) times daily.      . clopidogrel (PLAVIX) 75 MG tablet Take 1 tablet (75 mg total) by mouth daily.  30 tablet  11  . cyclobenzaprine (FLEXERIL) 10 MG tablet Take 1 tablet (10 mg total) by mouth at bedtime as needed.  30 tablet  3  . metoprolol tartrate (LOPRESSOR) 25 MG tablet Take 0.5 tablets (12.5 mg total) by mouth 2 (two) times daily.  30 tablet  11  . nitroGLYCERIN (NITROSTAT) 0.4 MG SL tablet Place 1 tablet (0.4 mg total) under the tongue every 5 (five) minutes x 3 doses as needed for chest pain.  30 tablet  12  . ONE TOUCH ULTRA TEST test strip       . ONETOUCH DELICA LANCETS MISC       . Pitavastatin Calcium 2 MG TABS Take 1 tablet (2 mg total) by mouth at bedtime.  30 tablet  3  . Saxagliptin-Metformin (KOMBIGLYZE XR) 2.01-999 MG  TB24 Take 2 tablets by mouth at bedtime.  60 tablet  3  . desvenlafaxine (PRISTIQ) 50 MG 24 hr tablet Take 50 mg by mouth at bedtime.       Marland Kitchen HYDROcodone-acetaminophen (VICODIN) 5-500 MG per tablet Take 1-2 tablets by mouth every 6 (six) hours as needed for pain.  20 tablet  0  . lamoTRIgine (LAMICTAL) 100 MG tablet Take 100 mg by mouth 2 (two) times daily.        No current facility-administered medications on file prior to visit.    BP 160/100  Pulse 91  Temp(Src) 98.2 F (36.8 C) (Oral)  Wt 153 lb (69.4 kg)  BMI 26.67 kg/m2  SpO2 97%chart    Objective:   Physical Exam  Constitutional: She is oriented to person, place, and time. She appears well-developed and well-nourished.  HENT:  Right Ear: External ear normal.  Left Ear: External ear normal.  Mouth/Throat: Oropharynx is clear and moist.  Neck: Normal  range of motion. Neck supple. No thyromegaly present.  Cardiovascular: Normal rate, regular rhythm and normal heart sounds.   Pulmonary/Chest: Effort normal and breath sounds normal.  Neurological: She is alert and oriented to person, place, and time. A cranial nerve deficit is present.  Skin: Skin is warm and dry.  Psychiatric: She has a normal mood and affect.          Assessment & Plan:  Assessment: 1. Acute sinusitis 2. Hypertension 3. Tobacco abuse  Plan: Suggested we change her blood pressure medication, patient refused. Patient now says that she is allergic to ACE inhibitors and is unable to take due to cough. Strongly encourage smoking cessation. Will treat with clindamycin 300 mg 3 times a day x1 week. Encouraged a probiotic or daily yogurt. Followup as scheduled

## 2012-12-26 NOTE — Patient Instructions (Addendum)
Smoking Cessation Quitting smoking is important to your health and has many advantages. However, it is not always easy to quit since nicotine is a very addictive drug. Often times, people try 3 times or more before being able to quit. This document explains the best ways for you to prepare to quit smoking. Quitting takes hard work and a lot of effort, but you can do it. ADVANTAGES OF QUITTING SMOKING  You will live longer, feel better, and live better.  Your body will feel the impact of quitting smoking almost immediately.  Within 20 minutes, blood pressure decreases. Your pulse returns to its normal level.  After 8 hours, carbon monoxide levels in the blood return to normal. Your oxygen level increases.  After 24 hours, the chance of having a heart attack starts to decrease. Your breath, hair, and body stop smelling like smoke.  After 48 hours, damaged nerve endings begin to recover. Your sense of taste and smell improve.  After 72 hours, the body is virtually free of nicotine. Your bronchial tubes relax and breathing becomes easier.  After 2 to 12 weeks, lungs can hold more air. Exercise becomes easier and circulation improves.  The risk of having a heart attack, stroke, cancer, or lung disease is greatly reduced.  After 1 year, the risk of coronary heart disease is cut in half.  After 5 years, the risk of stroke falls to the same as a nonsmoker.  After 10 years, the risk of lung cancer is cut in half and the risk of other cancers decreases significantly.  After 15 years, the risk of coronary heart disease drops, usually to the level of a nonsmoker.  If you are pregnant, quitting smoking will improve your chances of having a healthy baby.  The people you live with, especially any children, will be healthier.  You will have extra money to spend on things other than cigarettes. QUESTIONS TO THINK ABOUT BEFORE ATTEMPTING TO QUIT You may want to talk about your answers with your  caregiver.  Why do you want to quit?  If you tried to quit in the past, what helped and what did not?  What will be the most difficult situations for you after you quit? How will you plan to handle them?  Who can help you through the tough times? Your family? Friends? A caregiver?  What pleasures do you get from smoking? What ways can you still get pleasure if you quit? Here are some questions to ask your caregiver:  How can you help me to be successful at quitting?  What medicine do you think would be best for me and how should I take it?  What should I do if I need more help?  What is smoking withdrawal like? How can I get information on withdrawal? GET READY  Set a quit date.  Change your environment by getting rid of all cigarettes, ashtrays, matches, and lighters in your home, car, or work. Do not let people smoke in your home.  Review your past attempts to quit. Think about what worked and what did not. GET SUPPORT AND ENCOURAGEMENT You have a better chance of being successful if you have help. You can get support in many ways.  Tell your family, friends, and co-workers that you are going to quit and need their support. Ask them not to smoke around you.  Get individual, group, or telephone counseling and support. Programs are available at local hospitals and health centers. Call your local health department for   information about programs in your area.  Spiritual beliefs and practices may help some smokers quit.  Download a "quit meter" on your computer to keep track of quit statistics, such as how long you have gone without smoking, cigarettes not smoked, and money saved.  Get a self-help book about quitting smoking and staying off of tobacco. LEARN NEW SKILLS AND BEHAVIORS  Distract yourself from urges to smoke. Talk to someone, go for a walk, or occupy your time with a task.  Change your normal routine. Take a different route to work. Drink tea instead of coffee.  Eat breakfast in a different place.  Reduce your stress. Take a hot bath, exercise, or read a book.  Plan something enjoyable to do every day. Reward yourself for not smoking.  Explore interactive web-based programs that specialize in helping you quit. GET MEDICINE AND USE IT CORRECTLY Medicines can help you stop smoking and decrease the urge to smoke. Combining medicine with the above behavioral methods and support can greatly increase your chances of successfully quitting smoking.  Nicotine replacement therapy helps deliver nicotine to your body without the negative effects and risks of smoking. Nicotine replacement therapy includes nicotine gum, lozenges, inhalers, nasal sprays, and skin patches. Some may be available over-the-counter and others require a prescription.  Antidepressant medicine helps people abstain from smoking, but how this works is unknown. This medicine is available by prescription.  Nicotinic receptor partial agonist medicine simulates the effect of nicotine in your brain. This medicine is available by prescription. Ask your caregiver for advice about which medicines to use and how to use them based on your health history. Your caregiver will tell you what side effects to look out for if you choose to be on a medicine or therapy. Carefully read the information on the package. Do not use any other product containing nicotine while using a nicotine replacement product.  RELAPSE OR DIFFICULT SITUATIONS Most relapses occur within the first 3 months after quitting. Do not be discouraged if you start smoking again. Remember, most people try several times before finally quitting. You may have symptoms of withdrawal because your body is used to nicotine. You may crave cigarettes, be irritable, feel very hungry, cough often, get headaches, or have difficulty concentrating. The withdrawal symptoms are only temporary. They are strongest when you first quit, but they will go away within  10 14 days. To reduce the chances of relapse, try to:  Avoid drinking alcohol. Drinking lowers your chances of successfully quitting.  Reduce the amount of caffeine you consume. Once you quit smoking, the amount of caffeine in your body increases and can give you symptoms, such as a rapid heartbeat, sweating, and anxiety.  Avoid smokers because they can make you want to smoke.  Do not let weight gain distract you. Many smokers will gain weight when they quit, usually less than 10 pounds. Eat a healthy diet and stay active. You can always lose the weight gained after you quit.  Find ways to improve your mood other than smoking. FOR MORE INFORMATION  www.smokefree.gov  Document Released: 09/07/2001 Document Revised: 03/14/2012 Document Reviewed: 12/23/2011 [employer] Patient Information 2013 [employer].  

## 2012-12-27 ENCOUNTER — Ambulatory Visit (INDEPENDENT_AMBULATORY_CARE_PROVIDER_SITE_OTHER): Payer: 59 | Admitting: Cardiology

## 2012-12-27 ENCOUNTER — Encounter: Payer: Self-pay | Admitting: Cardiology

## 2012-12-27 VITALS — BP 123/84 | HR 88 | Ht 63.0 in | Wt 150.0 lb

## 2012-12-27 DIAGNOSIS — I25810 Atherosclerosis of coronary artery bypass graft(s) without angina pectoris: Secondary | ICD-10-CM

## 2012-12-27 DIAGNOSIS — R0989 Other specified symptoms and signs involving the circulatory and respiratory systems: Secondary | ICD-10-CM

## 2012-12-27 DIAGNOSIS — M79609 Pain in unspecified limb: Secondary | ICD-10-CM

## 2012-12-27 NOTE — Progress Notes (Signed)
HPI The patient presents for followup after recent hospitalization with a non-Q-wave myocardial infarction..  Catheterization demonstrated 20% proximal stenosis and 30% mid stenosis in the LAD. The circumflex had 20% proximal stenosis. The right coronary artery and 20% stenosis with distal vessel 99% stenosis. The patient underwent PTCA and stenting of the distal RCA.  At our first meeting recently I switched her to Plavix. I added beta blocker.  She had ABIs demonstrating only mild disease.  However, she continues to complain of increasing leg discomfort that could be consistent with claudication. She unfortunately has been severely fatigued. It's hard to know whether this is related to the beta blocker that I started at the last appointment. For instance she had to skip work to sleep all day because she was fatigued. She's not getting any of the chest discomfort she had a sleep. He's had no other acute cardiovascular symptoms.  Allergies  Allergen Reactions  . Statins     SEVERE MUSCLE PAIN  . Ace Inhibitors Cough    Patient refuses to take meds    Current Outpatient Prescriptions  Medication Sig Dispense Refill  . aspirin 81 MG tablet Take 1 tablet (81 mg total) by mouth 2 (two) times daily.      . busPIRone (BUSPAR) 5 MG tablet Take 5 mg by mouth 2 (two) times daily.      . clindamycin (CLEOCIN) 300 MG capsule Take 1 capsule (300 mg total) by mouth 3 (three) times daily.  21 capsule  0  . clopidogrel (PLAVIX) 75 MG tablet Take 1 tablet (75 mg total) by mouth daily.  30 tablet  11  . cyclobenzaprine (FLEXERIL) 10 MG tablet Take 1 tablet (10 mg total) by mouth at bedtime as needed.  30 tablet  3  . metoprolol tartrate (LOPRESSOR) 25 MG tablet Take 0.5 tablets (12.5 mg total) by mouth 2 (two) times daily.  30 tablet  11  . nitroGLYCERIN (NITROSTAT) 0.4 MG SL tablet Place 1 tablet (0.4 mg total) under the tongue every 5 (five) minutes x 3 doses as needed for chest pain.  30 tablet  12  .  ONE TOUCH ULTRA TEST test strip       . ONETOUCH DELICA LANCETS MISC       . Pitavastatin Calcium 2 MG TABS Take 1 tablet (2 mg total) by mouth at bedtime.  30 tablet  3  . Saxagliptin-Metformin (KOMBIGLYZE XR) 2.01-999 MG TB24 Take 2 tablets by mouth at bedtime.  60 tablet  3   No current facility-administered medications for this visit.    Past Medical History  Diagnosis Date  . Diabetes mellitus   . Fibromyalgia   . Coronary artery disease   . Anxiety   . Depression   . Asthma   . Acute MI, inferior wall, initial episode of care 07/08/2012    Cath-07/08/11 -distal RCA stent DES (99%), LAD 20-30%. EF 50% inferior hypokinesis  (infarct)  . Tobacco use disorder 07/08/2012  . Leukocytosis   . Monocytosis     Past Surgical History  Procedure Laterality Date  . Cardiac catheterization      ROS:  As stated in the HPI and negative for all other systems.  PHYSICAL EXAM BP 123/84  Pulse 88  Ht 5\' 3"  (1.6 m)  Wt 150 lb (68.04 kg)  BMI 26.58 kg/m2 GENERAL:  Well appearing NECK:  No jugular venous distention, waveform within normal limits, carotid upstroke brisk and symmetric, soft right carotid bruit, no thyromegaly LUNGS:  Clear to auscultation bilaterally CHEST:  Unremarkable HEART:  PMI not displaced or sustained,S1 and S2 within normal limits, no S3, no S4, no clicks, no rubs, no murmurs ABD:  Flat, positive bowel sounds normal in frequency in pitch, no bruits, no rebound, no guarding, no midline pulsatile mass, no hepatomegaly, no splenomegaly EXT:  2 plus pulses upper, 1+ dorsalis pedis bilateral, absent posterior tibialis bilateral, no edema, no cyanosis no clubbing PSYCH:  Cognitively intact, oriented to person place and time  EKG:  Sinus rhythm, rate 88, axis within normal limits, intervals within normal limits, no acute ST-T wave changes.  Old inferior infarct. 12/27/2012   ASSESSMENT AND PLAN  CAD  The patient has no new sypmtoms.  No further cardiovascular testing  is indicated.  We will continue with aggressive risk reduction and meds as listed.  Because of her fatigue we are going to try her back all the beta blocker to see if this was contributing. If she feels no improvement off of this medication she will restart.  Leg pain Her ABIs demonstrated mild disease.  However, with her continued symptoms I would like for her to see Dr. Kirke Corin.  He can consider exercise ABIs versus possible arteriogram.  Tobacco abuse She is unable to stop smoking.  Dyslipidemia She has been intolerant of medications other than Pitavistatin.  She will continue on this medicine.  Bruit I will check carotid Doppler.

## 2012-12-27 NOTE — Patient Instructions (Addendum)
The current medical regimen is effective;  continue present plan and medications.  Your physician has requested that you have a carotid duplex. This test is an ultrasound of the carotid arteries in your neck. It looks at blood flow through these arteries that supply the brain with blood. Allow one hour for this exam. There are no restrictions or special instructions.  You have been referred to see Dr. Kirke Corin for lower leg pain.  [address].  Follow up with Dr Antoine Poche in 3 months in [place].

## 2013-01-16 ENCOUNTER — Other Ambulatory Visit: Payer: Self-pay | Admitting: Family

## 2013-01-23 ENCOUNTER — Encounter: Payer: Self-pay | Admitting: Cardiology

## 2013-01-23 ENCOUNTER — Encounter (INDEPENDENT_AMBULATORY_CARE_PROVIDER_SITE_OTHER): Payer: 59

## 2013-01-23 ENCOUNTER — Ambulatory Visit (INDEPENDENT_AMBULATORY_CARE_PROVIDER_SITE_OTHER): Payer: 59 | Admitting: Cardiovascular Disease

## 2013-01-23 ENCOUNTER — Encounter: Payer: Self-pay | Admitting: Cardiovascular Disease

## 2013-01-23 VITALS — BP 140/80 | HR 88 | Ht 63.0 in | Wt 152.4 lb

## 2013-01-23 DIAGNOSIS — R0989 Other specified symptoms and signs involving the circulatory and respiratory systems: Secondary | ICD-10-CM

## 2013-01-23 DIAGNOSIS — I739 Peripheral vascular disease, unspecified: Secondary | ICD-10-CM | POA: Insufficient documentation

## 2013-01-23 DIAGNOSIS — F17200 Nicotine dependence, unspecified, uncomplicated: Secondary | ICD-10-CM

## 2013-01-23 DIAGNOSIS — I6529 Occlusion and stenosis of unspecified carotid artery: Secondary | ICD-10-CM

## 2013-01-23 DIAGNOSIS — Z01812 Encounter for preprocedural laboratory examination: Secondary | ICD-10-CM

## 2013-01-23 HISTORY — DX: Peripheral vascular disease, unspecified: I73.9

## 2013-01-23 LAB — CBC WITH DIFFERENTIAL/PLATELET
Basophils Absolute: 0 10*3/uL (ref 0.0–0.1)
Eosinophils Relative: 2.3 % (ref 0.0–5.0)
Lymphocytes Relative: 19.2 % (ref 12.0–46.0)
Monocytes Relative: 7.2 % (ref 3.0–12.0)
Neutrophils Relative %: 71.1 % (ref 43.0–77.0)
Platelets: 250 10*3/uL (ref 150.0–400.0)
RDW: 13.5 % (ref 11.5–14.6)
WBC: 16.6 10*3/uL — ABNORMAL HIGH (ref 4.5–10.5)

## 2013-01-23 LAB — BASIC METABOLIC PANEL
Calcium: 9.6 mg/dL (ref 8.4–10.5)
Creatinine, Ser: 0.7 mg/dL (ref 0.4–1.2)
Sodium: 140 mEq/L (ref 135–145)

## 2013-01-23 LAB — PROTIME-INR
INR: 1 ratio (ref 0.8–1.0)
Prothrombin Time: 10.4 s (ref 10.2–12.4)

## 2013-01-23 NOTE — Assessment & Plan Note (Signed)
The patient does have evidence of peripheral arterial disease with mildly reduced ABI on the right side and borderline on the left side. I suspect that her symptoms are a combination of claudication as well as neuropathic discomfort. I cannot explain all her symptoms just on the basis of peripheral arterial disease. Although her ABI was mildly reduced, femoral pulses are diminished bilaterally which might indicate aortoiliac disease. ABI in this situation might underestimate the severity of disease. Given the severity of her symptoms, I recommend proceeding with abdominal aortogram, lower extremity runoff and possible angioplasty. She might also benefit from treatment with gabapentin or Lyrica.

## 2013-01-23 NOTE — Progress Notes (Signed)
HPI  This is a 57 year old female who was referred by Dr. Antoine Poche for evaluation of claudication and peripheral arterial disease. She has known history of coronary artery disease with  a non-Q-wave myocardial infarction in October of last year. Catheterization demonstrated 20% proximal stenosis and 30% mid stenosis in the LAD. The circumflex had 20% proximal stenosis. The right coronary artery and 20% stenosis with distal vessel 99% stenosis. The patient underwent PTCA and stenting of the distal RCA.  She had an ABI done for possible claudication and was mildly abnormal bilaterally worse on the right side. She complains of mostly right leg discomfort that starts in the hip area and radiates down to the calf and foot. This happens after she walks about 100 yards but also can happen at rest with continuous cramping mostly at night. She has significant numbness as well. She has suffered from this for about 4 years and has worsened recently. She has been on Flexeril for muscle spasm with no significant improvement. On the left side, she complains of discomfort in the left hip area mainly with no thigh or calf discomfort. She does report previous back problems. She has prolonged history of tobacco use, type 2 diabetes and hyperlipidemia.   Allergies  Allergen Reactions  . Statins     SEVERE MUSCLE PAIN  . Ace Inhibitors Cough    Patient refuses to take meds     Current Outpatient Prescriptions on File Prior to Visit  Medication Sig Dispense Refill  . aspirin 81 MG tablet Take 1 tablet (81 mg total) by mouth 2 (two) times daily.      . busPIRone (BUSPAR) 5 MG tablet Take 5 mg by mouth 2 (two) times daily.      . clopidogrel (PLAVIX) 75 MG tablet Take 1 tablet (75 mg total) by mouth daily.  30 tablet  11  . cyclobenzaprine (FLEXERIL) 10 MG tablet Take 1 tablet (10 mg total) by mouth at bedtime as needed.  30 tablet  3  . KOMBIGLYZE XR 2.01-999 MG TB24 TAKE 2 TABLETS BY MOUTH AT BEDTIME.  60  tablet  3  . LIVALO 2 MG TABS TAKE 1 TABLET (2 MG TOTAL) BY MOUTH AT BEDTIME.  30 tablet  3  . nitroGLYCERIN (NITROSTAT) 0.4 MG SL tablet Place 1 tablet (0.4 mg total) under the tongue every 5 (five) minutes x 3 doses as needed for chest pain.  30 tablet  12  . ONE TOUCH ULTRA TEST test strip       . ONETOUCH DELICA LANCETS MISC        No current facility-administered medications on file prior to visit.     Past Medical History  Diagnosis Date  . Diabetes mellitus   . Fibromyalgia   . Coronary artery disease   . Anxiety   . Depression   . Asthma   . Acute MI, inferior wall, initial episode of care 07/08/2012    Cath-07/08/11 -distal RCA stent DES (99%), LAD 20-30%. EF 50% inferior hypokinesis  (infarct)  . Tobacco use disorder 07/08/2012  . Leukocytosis   . Monocytosis      Past Surgical History  Procedure Laterality Date  . Cardiac catheterization       Family History  Problem Relation Age of Onset  . Diabetes Mother      History   Social History  . Marital Status: Married    Spouse Name: N/A    Number of Children: 3  . Years of Education: N/A  Occupational History  .  [employer]   Social History Main Topics  . Smoking status: Current Every Day Smoker -- 1.50 packs/day  . Smokeless tobacco: Not on file  . Alcohol Use: Yes     Comment: Occasionally  . Drug Use: No  . Sexually Active: Yes   Other Topics Concern  . Not on file   Social History Narrative   Lives with husband.       ROS A10 point review of system was performed and is negative other than what is mentioned in the history of present illness  PHYSICAL EXAM   BP 140/80  Pulse 88  Ht 5\' 3"  (1.6 m)  Wt 152 lb 6.4 oz (69.128 kg)  BMI 27 kg/m2 Constitutional: She is oriented to person, place, and time. She appears well-developed and well-nourished. No distress.  HENT: No nasal discharge.  Head: Normocephalic and atraumatic.  Eyes: Pupils are equal and round. Right eye  exhibits no discharge. Left eye exhibits no discharge.  Neck: Normal range of motion. Neck supple. No JVD present. No thyromegaly present.  Cardiovascular: Normal rate, regular rhythm, normal heart sounds. Exam reveals no gallop and no friction rub. No murmur heard.  Pulmonary/Chest: Effort normal and breath sounds normal. No stridor. No respiratory distress. She has no wheezes. She has no rales. She exhibits no tenderness.  Abdominal: Soft. Bowel sounds are normal. She exhibits no distension. There is no tenderness. There is no rebound and no guarding.  Musculoskeletal: Normal range of motion. She exhibits no edema and no tenderness.  Neurological: She is alert and oriented to person, place, and time. Coordination normal.  Skin: Skin is warm and dry. No rash noted. She is not diaphoretic. No erythema. No pallor.  Psychiatric: She has a normal mood and affect. Her behavior is normal. Judgment and thought content normal.  Vascular: Radial pulses are normal. Femoral pulse: Mildly diminished bilaterally. DP/PT faint bilaterally.     ASSESSMENT AND PLAN

## 2013-01-23 NOTE — Assessment & Plan Note (Signed)
Had a prolonged discussion with her about the importance of smoking cessation especially with the presence of coronary artery disease and peripheral arterial disease. She has tried Chantix in the past which gave her significant side effects.

## 2013-01-23 NOTE — Patient Instructions (Addendum)
Your physician recommends that you have labs today: BMET, CBC w/Diff, PTINR  Your physician has recommended that you have a Abdominal Aorta with LE run-off on 02/07/2013 @ 8:30 am. After this is performed, and if a blockage is found, an angioplasty or stenting procedure may be necessary. Please refer to letter given at today's visit.  Your physician recommends that you continue on your current medications as directed. Please refer to the Current Medication list given to you today.

## 2013-01-29 ENCOUNTER — Telehealth: Payer: Self-pay | Admitting: Cardiovascular Disease

## 2013-01-29 NOTE — Telephone Encounter (Signed)
New Prob     Pt is having a cath done on 5/14 and would like some more information on the procedure. Would like to speak to nurse.

## 2013-02-01 ENCOUNTER — Encounter (HOSPITAL_COMMUNITY): Payer: Self-pay | Admitting: Pharmacy Technician

## 2013-02-01 NOTE — Telephone Encounter (Signed)
I left a message for the patient to call. Sherri Rad, RN, BSN  Prior to finishing this message, the patient called back and stated someone called her back yesterday and answered her questions. Sherri Rad, RN, BSN

## 2013-02-07 ENCOUNTER — Encounter (HOSPITAL_COMMUNITY)
Admission: RE | Disposition: A | Discharge status: Home / Self Care | Payer: Self-pay | Source: Ambulatory Visit | Attending: Cardiovascular Disease

## 2013-02-07 ENCOUNTER — Ambulatory Visit (HOSPITAL_COMMUNITY)
Admission: RE | Admit: 2013-02-07 | Discharge: 2013-02-07 | Disposition: A | Discharge status: Home / Self Care | Payer: 59 | Source: Ambulatory Visit | Attending: Cardiovascular Disease | Admitting: Cardiovascular Disease

## 2013-02-07 DIAGNOSIS — Z79899 Other long term (current) drug therapy: Secondary | ICD-10-CM | POA: Insufficient documentation

## 2013-02-07 DIAGNOSIS — F329 Major depressive disorder, single episode, unspecified: Secondary | ICD-10-CM | POA: Insufficient documentation

## 2013-02-07 DIAGNOSIS — M62838 Other muscle spasm: Secondary | ICD-10-CM | POA: Insufficient documentation

## 2013-02-07 DIAGNOSIS — J45909 Unspecified asthma, uncomplicated: Secondary | ICD-10-CM | POA: Insufficient documentation

## 2013-02-07 DIAGNOSIS — F3289 Other specified depressive episodes: Secondary | ICD-10-CM | POA: Insufficient documentation

## 2013-02-07 DIAGNOSIS — IMO0001 Reserved for inherently not codable concepts without codable children: Secondary | ICD-10-CM | POA: Insufficient documentation

## 2013-02-07 DIAGNOSIS — I70209 Unspecified atherosclerosis of native arteries of extremities, unspecified extremity: Secondary | ICD-10-CM | POA: Insufficient documentation

## 2013-02-07 DIAGNOSIS — Z7982 Long term (current) use of aspirin: Secondary | ICD-10-CM | POA: Insufficient documentation

## 2013-02-07 DIAGNOSIS — I700 Atherosclerosis of aorta: Secondary | ICD-10-CM | POA: Insufficient documentation

## 2013-02-07 DIAGNOSIS — E119 Type 2 diabetes mellitus without complications: Secondary | ICD-10-CM | POA: Insufficient documentation

## 2013-02-07 DIAGNOSIS — I708 Atherosclerosis of other arteries: Secondary | ICD-10-CM | POA: Insufficient documentation

## 2013-02-07 DIAGNOSIS — I739 Peripheral vascular disease, unspecified: Secondary | ICD-10-CM

## 2013-02-07 DIAGNOSIS — F411 Generalized anxiety disorder: Secondary | ICD-10-CM | POA: Insufficient documentation

## 2013-02-07 DIAGNOSIS — Z888 Allergy status to other drugs, medicaments and biological substances status: Secondary | ICD-10-CM | POA: Insufficient documentation

## 2013-02-07 DIAGNOSIS — I2510 Atherosclerotic heart disease of native coronary artery without angina pectoris: Secondary | ICD-10-CM | POA: Insufficient documentation

## 2013-02-07 DIAGNOSIS — Z87891 Personal history of nicotine dependence: Secondary | ICD-10-CM | POA: Insufficient documentation

## 2013-02-07 DIAGNOSIS — E785 Hyperlipidemia, unspecified: Secondary | ICD-10-CM | POA: Insufficient documentation

## 2013-02-07 DIAGNOSIS — I70219 Atherosclerosis of native arteries of extremities with intermittent claudication, unspecified extremity: Secondary | ICD-10-CM

## 2013-02-07 DIAGNOSIS — I252 Old myocardial infarction: Secondary | ICD-10-CM | POA: Insufficient documentation

## 2013-02-07 HISTORY — PX: ABDOMINAL AORTAGRAM: SHX5454

## 2013-02-07 LAB — BASIC METABOLIC PANEL
BUN: 15 mg/dL (ref 6–23)
Calcium: 10.2 mg/dL (ref 8.4–10.5)
Creatinine, Ser: 0.57 mg/dL (ref 0.50–1.10)
GFR calc non Af Amer: >90 mL/min (ref >90)
Glucose, Bld: 169 mg/dL — ABNORMAL HIGH (ref 70–99)
Sodium: 141 mEq/L (ref 135–145)

## 2013-02-07 LAB — CBC
HCT: 45.1 % (ref 36.0–46.0)
Hemoglobin: 16.1 g/dL — ABNORMAL HIGH (ref 12.0–15.0)
MCH: 31.4 pg (ref 26.0–34.0)
MCHC: 35.7 g/dL (ref 30.0–36.0)
MCV: 88.1 fL (ref 78.0–100.0)

## 2013-02-07 SURGERY — ABDOMINAL AORTAGRAM
Anesthesia: LOCAL

## 2013-02-07 MED ORDER — HEPARIN (PORCINE) IN NACL 2-0.9 UNIT/ML-% IJ SOLN
INTRAMUSCULAR | Status: AC
  Filled 2013-02-07: qty 1000

## 2013-02-07 MED ORDER — LIDOCAINE HCL (PF) 1 % IJ SOLN
INTRAMUSCULAR | Status: AC
  Filled 2013-02-07: qty 30

## 2013-02-07 MED ORDER — ONDANSETRON HCL 4 MG/2ML IJ SOLN: 4.0000 mg | Freq: Four times a day (QID) | INTRAMUSCULAR | Status: DC | PRN

## 2013-02-07 MED ORDER — SODIUM CHLORIDE 0.9 % IV SOLN
INTRAVENOUS | Status: DC
  Administered 2013-02-07: 07:00:00 via INTRAVENOUS

## 2013-02-07 MED ORDER — SODIUM CHLORIDE 0.9 % IV SOLN: INTRAVENOUS | Status: AC

## 2013-02-07 MED ORDER — ASPIRIN 81 MG PO CHEW: 324.0000 mg | CHEWABLE_TABLET | ORAL | Status: DC

## 2013-02-07 MED ORDER — FENTANYL CITRATE 0.05 MG/ML IJ SOLN: 50.0000 ug | Freq: Once | INTRAMUSCULAR | Status: AC

## 2013-02-07 MED ORDER — SODIUM CHLORIDE 0.9 % IJ SOLN: 3.0000 mL | Freq: Two times a day (BID) | INTRAMUSCULAR | Status: DC

## 2013-02-07 MED ORDER — DIAZEPAM 5 MG PO TABS
ORAL_TABLET | ORAL | Status: AC
  Filled 2013-02-07: qty 2

## 2013-02-07 MED ORDER — DIAZEPAM 5 MG PO TABS
10.0000 mg | ORAL_TABLET | Freq: Once | ORAL | Status: AC
  Administered 2013-02-07: 10 mg via ORAL

## 2013-02-07 MED ORDER — NITROGLYCERIN 1 MG/10 ML FOR IR/CATH LAB
INTRA_ARTERIAL | Status: AC
  Filled 2013-02-07: qty 10

## 2013-02-07 MED ORDER — CLOPIDOGREL BISULFATE 75 MG PO TABS
75.0000 mg | ORAL_TABLET | Freq: Once | ORAL | Status: AC
  Administered 2013-02-07: 75 mg via ORAL

## 2013-02-07 MED ORDER — CLOPIDOGREL BISULFATE 75 MG PO TABS
ORAL_TABLET | ORAL | Status: AC
  Filled 2013-02-07: qty 1

## 2013-02-07 MED ORDER — SODIUM CHLORIDE 0.9 % IV SOLN: 250.0000 mL | INTRAVENOUS | Status: DC | PRN

## 2013-02-07 MED ORDER — FENTANYL CITRATE 0.05 MG/ML IJ SOLN
INTRAMUSCULAR | Status: AC
  Administered 2013-02-07: 50 ug via INTRAVENOUS
  Filled 2013-02-07: qty 2

## 2013-02-07 MED ORDER — MIDAZOLAM HCL 2 MG/2ML IJ SOLN
INTRAMUSCULAR | Status: AC
  Filled 2013-02-07: qty 2

## 2013-02-07 MED ORDER — SODIUM CHLORIDE 0.9 % IJ SOLN: 3.0000 mL | INTRAMUSCULAR | Status: DC | PRN

## 2013-02-07 MED ORDER — ACETAMINOPHEN 325 MG PO TABS: 650.0000 mg | ORAL_TABLET | ORAL | Status: DC | PRN

## 2013-02-07 MED ORDER — FENTANYL CITRATE 0.05 MG/ML IJ SOLN
INTRAMUSCULAR | Status: AC
  Filled 2013-02-07: qty 2

## 2013-02-07 NOTE — Interval H&P Note (Signed)
History and Physical Interval Note:  02/07/2013 8:39 AM  [name]  has presented today for surgery, with the diagnosis of pad  The various methods of treatment have been discussed with the patient and family. After consideration of risks, benefits and other options for treatment, the patient has consented to  Procedure(s): ABDOMINAL AORTAGRAM (N/A) as a surgical intervention .  The patient's history has been reviewed, patient examined, no change in status, stable for surgery.  I have reviewed the patient's chart and labs.  Questions were answered to the patient's satisfaction.     Lorine Bears

## 2013-02-07 NOTE — Progress Notes (Signed)
Pt requesting pain medication, Dr. Kirke Corin paged.

## 2013-02-07 NOTE — Research (Signed)
[facility] Informed Consent   Subject Name: [name]  Subject met inclusion and exclusion criteria.  The informed consent form, study requirements and expectations were reviewed with the subject and questions and concerns were addressed prior to the signing of the consent form.  The subject verbalized understanding of the trial requirements.  The subject agreed to participate in the [facility] trial and signed the informed consent.  The informed consent was obtained prior to performance of any protocol-specific procedures for the subject.  A copy of the signed informed consent was given to the subject and a copy was placed in the subject's medical record.  Brunilda Payor 02/07/2013, 08:00am

## 2013-02-07 NOTE — Progress Notes (Signed)
   S: CTSP 2/2 bruising @ right groin cath site.  Pt had large hematoma earlier, which was expressed.  Pt is very anxious and wishing to leave.   O:   Filed Vitals:   02/07/13 1300  BP: 167/79  Pulse: 89  Temp:   Resp:   Pt is tearful and visibly upset.  She says that she's dealing with "issues" and needs to go home.  R groin cath site is soft with an area of ecchymosis of ~ 3x6".  The area is tender to deeper palpation.  No flank bruising or tenderness.  No bruit.  DP 1+ bilat.  A/P: 1.  R groin hematoma:  Site appears to be stable.  Soft with ecchymosis.  No bruit.  Pt remains on bedrest until 4:30, though in the setting of anxiety, she has stated that she has left AMA before and will do it again.  I recommended that she complete her bedrest as ordered.  2.  Anxiety:  I offered an anxiolytic however she denied.  She says that she needs to leave and understands that that would mean leaving AMA.  Nicolasa Ducking, NP

## 2013-02-07 NOTE — CV Procedure (Signed)
     PERIPHERAL VASCULAR PROCEDURE  NAME:  Leighton Luster   MRN: [id number] DOB:  Jul 23, 1956   ADMIT DATE: 02/07/2013  Performing Cardiologist: Lorine Bears Primary Physician: Janell Quiet, FNP Primary Cardiologist:  Dr. Antoine Poche   Procedures Performed:  Abdominal Aortic Angiogram with Bi-Iliofemoral Runoff  Bilateral Lower Extremity Angiography (1st Order)   Indication(s):   Claudication    Consent: The procedure with Risks/Benefits/Alternatives and Indications was reviewed with the patient .  All questions were answered.  Medications:  Sedation:  2 mg IV Versed, 25 mcg IV Fentanyl  Contrast:  160 mL of Visipaque  Procedural details: The right groin was prepped, draped, and anesthetized with 1% lidocaine. The femoral pulse was very weak about slight used a micropuncture needle and kit which was then exchanged into a 5 [place] sheath. A 5 Fr Short Pigtail Catheter was advanced of over a Versicore wire into the descending Aorta to a level just above the renal arteries. A power injection of 56ml/sec contrast over 1 sec was performed for Abdominal Aortic Angiography.  The catheter was then pulled back to a level just above the Aortic bifurcation, and a second power injection was performed to evaluate bi-ileiofemoral arteries with runnoff. Initially, significant stenosis was noted in the distal right external iliac artery. Endovascular intervention was planned and the patient was enrolled in the Endomax trial. The sheath was exchanged into a 6 French sheath. I gave 200 mcg of nitroglycerin through the sheath and repeated angiography which showed significant improvement in the stenosis to no more than 50%. I performed a pullback with a straight tip endhole catheter which showed a peak to peak gradient of only 10-15 mm mercury. Based on this, no intervention was performed.   Hemodynamics:  Central Aortic Pressure / Mean Aortic Pressure: 113/63  Findings:  Abdominal aorta:  Overall small in size with no evidence of aneurysm. There is mild diffuse atherosclerosis.  Left renal artery: Normal  Right renal artery: Normal  Celiac artery: Not visualized  Superior mesenteric artery: Not visualized  Right common iliac artery: Small in caliber with mild diffuse nonobstructive disease.  Right internal iliac artery: Mild proximal 30% disease.  Right external iliac artery: There is 50% lesion distally. Initially this was occlusive around the pigtail catheter. However, this improved significantly after giving nitroglycerin.  Right common femoral artery: Minor irregularities.  Right profunda femoral artery: Normal  Right superficial femoral artery: Mild diffuse nonobstructive disease.  Right popliteal artery: Normal  Three-vessel runoff below the knee   Left common iliac artery:  Small in caliber with mild diffuse nonobstructive disease.  Left internal iliac artery: Mild proximal disease.  Left external iliac artery: Minor irregularities.  Left common femoral artery: Minor irregularities.  Left profunda femoral artery: Normal  Left superficial femoral artery:  Mild nonobstructive disease.   Left popliteal artery: normal.   Three-vessel runoff below the knee   Conclusions:  1. Moderate distal right external iliac artery stenosis with severe spasm noted which improved significantly with nitroglycerin. Peak to peak systolic gradient was 10-15 mm Hg.  No evidence of other obstructive disease.   2. Three-vessel runoff below the knee bilaterally.  Recommendations:   continue medical therapy. I might consider placement with long-acting nitroglycerin or Pletal. Smoking cessation is advised.    Lorine Bears, MD, [facility] 02/07/2013 9:49 AM

## 2013-02-07 NOTE — Progress Notes (Signed)
Upon groin check, pt found to have hematoma at right groin site. Pressure held x 25 minutes, hematoma resolved. Dr. Kirke Corin paged and informed, order received to restart 4 hours of bedrest once stop holding pressure. Pt informed.

## 2013-02-07 NOTE — H&P (View-Only) (Signed)
  HPI  This is a 56-year-old female who was referred by Dr. Hochrein for evaluation of claudication and peripheral arterial disease. She has known history of coronary artery disease with  a non-Q-wave myocardial infarction in October of last year. Catheterization demonstrated 20% proximal stenosis and 30% mid stenosis in the LAD. The circumflex had 20% proximal stenosis. The right coronary artery and 20% stenosis with distal vessel 99% stenosis. The patient underwent PTCA and stenting of the distal RCA.  She had an ABI done for possible claudication and was mildly abnormal bilaterally worse on the right side. She complains of mostly right leg discomfort that starts in the hip area and radiates down to the calf and foot. This happens after she walks about 100 yards but also can happen at rest with continuous cramping mostly at night. She has significant numbness as well. She has suffered from this for about 4 years and has worsened recently. She has been on Flexeril for muscle spasm with no significant improvement. On the left side, she complains of discomfort in the left hip area mainly with no thigh or calf discomfort. She does report previous back problems. She has prolonged history of tobacco use, type 2 diabetes and hyperlipidemia.   Allergies  Allergen Reactions  . Statins     SEVERE MUSCLE PAIN  . Ace Inhibitors Cough    Patient refuses to take meds     Current Outpatient Prescriptions on File Prior to Visit  Medication Sig Dispense Refill  . aspirin 81 MG tablet Take 1 tablet (81 mg total) by mouth 2 (two) times daily.      . busPIRone (BUSPAR) 5 MG tablet Take 5 mg by mouth 2 (two) times daily.      . clopidogrel (PLAVIX) 75 MG tablet Take 1 tablet (75 mg total) by mouth daily.  30 tablet  11  . cyclobenzaprine (FLEXERIL) 10 MG tablet Take 1 tablet (10 mg total) by mouth at bedtime as needed.  30 tablet  3  . KOMBIGLYZE XR 2.01-999 MG TB24 TAKE 2 TABLETS BY MOUTH AT BEDTIME.  60  tablet  3  . LIVALO 2 MG TABS TAKE 1 TABLET (2 MG TOTAL) BY MOUTH AT BEDTIME.  30 tablet  3  . nitroGLYCERIN (NITROSTAT) 0.4 MG SL tablet Place 1 tablet (0.4 mg total) under the tongue every 5 (five) minutes x 3 doses as needed for chest pain.  30 tablet  12  . ONE TOUCH ULTRA TEST test strip       . ONETOUCH DELICA LANCETS MISC        No current facility-administered medications on file prior to visit.     Past Medical History  Diagnosis Date  . Diabetes mellitus   . Fibromyalgia   . Coronary artery disease   . Anxiety   . Depression   . Asthma   . Acute MI, inferior wall, initial episode of care 07/08/2012    Cath-07/08/11 -distal RCA stent DES (99%), LAD 20-30%. EF 50% inferior hypokinesis  (infarct)  . Tobacco use disorder 07/08/2012  . Leukocytosis   . Monocytosis      Past Surgical History  Procedure Laterality Date  . Cardiac catheterization       Family History  Problem Relation Age of Onset  . Diabetes Mother      History   Social History  . Marital Status: Married    Spouse Name: N/A    Number of Children: 3  . Years of Education: N/A     Occupational History  .  [employer]   Social History Main Topics  . Smoking status: Current Every Day Smoker -- 1.50 packs/day  . Smokeless tobacco: Not on file  . Alcohol Use: Yes     Comment: Occasionally  . Drug Use: No  . Sexually Active: Yes   Other Topics Concern  . Not on file   Social History Narrative   Lives with husband.       ROS A10 point review of system was performed and is negative other than what is mentioned in the history of present illness  PHYSICAL EXAM   BP 140/80  Pulse 88  Ht 5' 3" (1.6 m)  Wt 152 lb 6.4 oz (69.128 kg)  BMI 27 kg/m2 Constitutional: She is oriented to person, place, and time. She appears well-developed and well-nourished. No distress.  HENT: No nasal discharge.  Head: Normocephalic and atraumatic.  Eyes: Pupils are equal and round. Right eye  exhibits no discharge. Left eye exhibits no discharge.  Neck: Normal range of motion. Neck supple. No JVD present. No thyromegaly present.  Cardiovascular: Normal rate, regular rhythm, normal heart sounds. Exam reveals no gallop and no friction rub. No murmur heard.  Pulmonary/Chest: Effort normal and breath sounds normal. No stridor. No respiratory distress. She has no wheezes. She has no rales. She exhibits no tenderness.  Abdominal: Soft. Bowel sounds are normal. She exhibits no distension. There is no tenderness. There is no rebound and no guarding.  Musculoskeletal: Normal range of motion. She exhibits no edema and no tenderness.  Neurological: She is alert and oriented to person, place, and time. Coordination normal.  Skin: Skin is warm and dry. No rash noted. She is not diaphoretic. No erythema. No pallor.  Psychiatric: She has a normal mood and affect. Her behavior is normal. Judgment and thought content normal.  Vascular: Radial pulses are normal. Femoral pulse: Mildly diminished bilaterally. DP/PT faint bilaterally.     ASSESSMENT AND PLAN   

## 2013-02-07 NOTE — Progress Notes (Signed)
Solon Palm at bedside to evaluate pt's groin prior to d/c.

## 2013-02-08 ENCOUNTER — Telehealth: Payer: Self-pay | Admitting: Cardiovascular Disease

## 2013-02-08 NOTE — Telephone Encounter (Signed)
New problem   Pt needs to talk about the surgery because she was not alert when everything was explained

## 2013-02-09 ENCOUNTER — Other Ambulatory Visit: Payer: Self-pay

## 2013-02-09 MED ORDER — ISOSORBIDE MONONITRATE ER 30 MG PO TB24: 30.0000 mg | ORAL_TABLET | Freq: Every day | ORAL | Status: DC

## 2013-02-09 NOTE — Telephone Encounter (Signed)
Start Imdur 30 mg once daily.  Stay off work for a week from date of procedure.

## 2013-02-09 NOTE — Telephone Encounter (Signed)
Pt wanted me to explain findings from 5/14 PV procedure I explained these to her, based on procedure note She says hematoma is resolving and is getting softer  She says she continues to have leg pain and asks if needs meds for this I told her I would check with Dr. Kirke Corin since he did mention a long-acting nitrate in future secondary to spasm  She also asks when she should return to work with regards to hematoma Has a fairly active job, climbing ladders, walking a lot and "up and down all day" I told her I would check with Dr. Kirke Corin and call her back Understanding verb

## 2013-02-09 NOTE — Telephone Encounter (Signed)
Pt informed Understanding verb RX sent to pharm 

## 2013-03-06 ENCOUNTER — Encounter: Payer: Self-pay | Admitting: Cardiovascular Disease

## 2013-03-06 ENCOUNTER — Ambulatory Visit (INDEPENDENT_AMBULATORY_CARE_PROVIDER_SITE_OTHER): Payer: 59 | Admitting: Cardiovascular Disease

## 2013-03-06 VITALS — BP 114/78 | HR 98 | Ht 63.0 in | Wt 149.1 lb

## 2013-03-06 DIAGNOSIS — I739 Peripheral vascular disease, unspecified: Secondary | ICD-10-CM

## 2013-03-06 NOTE — Progress Notes (Signed)
Primary cardiologist: Dr. Antoine Poche  HPI  This is a 57 year old female who is here today for a follow up visit regarding PAD.  She has known history of coronary artery disease with  a non-Q-wave myocardial infarction in October of last year. Catheterization demonstrated 20% proximal stenosis and 30% mid stenosis in the LAD. The circumflex had 20% proximal stenosis. The right coronary artery and 20% stenosis with distal vessel 99% stenosis. The patient underwent PTCA and stenting of the distal RCA.  She had an ABI done for possible claudication and was mildly abnormal bilaterally worse on the right side. She complains of mostly right leg discomfort that starts in the hip area and radiates down to the calf and foot. This happens after she walks about 100 yards but also can happen at rest with continuous cramping mostly at night. She has significant numbness as well. She has suffered from this for about 4 years and has worsened recently. She has been on Flexeril for muscle spasm with no significant improvement. On the left side, she complains of discomfort in the left hip area mainly with no thigh or calf discomfort. She does report previous back problems. She has prolonged history of tobacco use, type 2 diabetes and hyperlipidemia. I performed angiography which initially showed severe disease in right external iliac artery. However, this improved significantly with NTG with a systolic gradient of only 10-15 mm hg. I decided to start her on Imdur 30 mg once daily. She reports significant improvement in her symptoms.    Allergies  Allergen Reactions  . Statins     SEVERE MUSCLE PAIN  . Ace Inhibitors Cough    Patient refuses to take meds     Current Outpatient Prescriptions on File Prior to Visit  Medication Sig Dispense Refill  . aspirin 81 MG tablet Take 1 tablet (81 mg total) by mouth 2 (two) times daily.      . busPIRone (BUSPAR) 10 MG tablet Take 15 mg by mouth 2 (two) times daily.       .  clopidogrel (PLAVIX) 75 MG tablet Take 1 tablet (75 mg total) by mouth daily.  30 tablet  11  . cyclobenzaprine (FLEXERIL) 10 MG tablet Take 1 tablet (10 mg total) by mouth at bedtime as needed.  30 tablet  3  . isosorbide mononitrate (IMDUR) 30 MG 24 hr tablet Take 1 tablet (30 mg total) by mouth daily.  90 tablet  3  . nitroGLYCERIN (NITROSTAT) 0.4 MG SL tablet Place 1 tablet (0.4 mg total) under the tongue every 5 (five) minutes x 3 doses as needed for chest pain.  30 tablet  12  . Saxagliptin-Metformin (KOMBIGLYZE XR) 2.01-999 MG TB24 Take 2 tablets by mouth at bedtime.      . Pitavastatin Calcium (LIVALO) 2 MG TABS Take 2 mg by mouth at bedtime.       No current facility-administered medications on file prior to visit.     Past Medical History  Diagnosis Date  . Diabetes mellitus   . Fibromyalgia   . Coronary artery disease   . Anxiety   . Depression   . Asthma   . Acute MI, inferior wall, initial episode of care 07/08/2012    Cath-07/08/11 -distal RCA stent DES (99%), LAD 20-30%. EF 50% inferior hypokinesis  (infarct)  . Tobacco use disorder 07/08/2012  . Leukocytosis   . Monocytosis      Past Surgical History  Procedure Laterality Date  . Cardiac catheterization  Family History  Problem Relation Age of Onset  . Diabetes Mother      History   Social History  . Marital Status: Married    Spouse Name: N/A    Number of Children: 3  . Years of Education: N/A   Occupational History  .  [employer]   Social History Main Topics  . Smoking status: Current Every Day Smoker -- 1.50 packs/day  . Smokeless tobacco: Not on file  . Alcohol Use: Yes     Comment: Occasionally  . Drug Use: No  . Sexually Active: Yes   Other Topics Concern  . Not on file   Social History Narrative   Lives with husband.       ROS A10 point review of system was performed and is negative other than what is mentioned in the history of present illness  PHYSICAL  EXAM   BP 114/78  Pulse 98  Ht 5\' 3"  (1.6 m)  Wt 149 lb 1.9 oz (67.64 kg)  BMI 26.42 kg/m2  SpO2 85% Constitutional: She is oriented to person, place, and time. She appears well-developed and well-nourished. No distress.  HENT: No nasal discharge.  Head: Normocephalic and atraumatic.  Eyes: Pupils are equal and round. Right eye exhibits no discharge. Left eye exhibits no discharge.  Neck: Normal range of motion. Neck supple. No JVD present. No thyromegaly present.  Cardiovascular: Normal rate, regular rhythm, normal heart sounds. Exam reveals no gallop and no friction rub. No murmur heard.  Pulmonary/Chest: Effort normal and breath sounds normal. No stridor. No respiratory distress. She has no wheezes. She has no rales. She exhibits no tenderness.  Abdominal: Soft. Bowel sounds are normal. She exhibits no distension. There is no tenderness. There is no rebound and no guarding.  Musculoskeletal: Normal range of motion. She exhibits no edema and no tenderness.  Neurological: She is alert and oriented to person, place, and time. Coordination normal.  Skin: Skin is warm and dry. No rash noted. She is not diaphoretic. No erythema. No pallor.  Psychiatric: She has a normal mood and affect. Her behavior is normal. Judgment and thought content normal.  Vascular: Radial pulses are normal. Femoral pulse: Mildly diminished bilaterally. DP/PT faint bilaterally. No groin hematoma.     ASSESSMENT AND PLAN

## 2013-03-06 NOTE — Assessment & Plan Note (Signed)
Angiography in 01/2013 showed 50% distal right external iliac artery stenosis with severe spasm which improved with NTG. Mild SFA disease bilaterally with 3 vessel run off. No intervention was needed.  This is an unusual case. She reports significant improvement in her symptoms after the addition of long acting NTG. I had a prolonged discussion with her about importance of smoking cessation. Continue treatment of risk factors. For recurrent symptoms, can consider increasing Imdur or adding Pletal.

## 2013-03-06 NOTE — Patient Instructions (Addendum)
Your physician wants you to follow-up in: 6 months  You will receive a reminder letter in the mail two months in advance. If you don't receive a letter, please call our office to schedule the follow-up appointment.  Your physician recommends that you continue on your current medications as directed. Please refer to the Current Medication list given to you today.  

## 2013-03-08 ENCOUNTER — Encounter: Payer: Self-pay | Admitting: Family

## 2013-03-09 ENCOUNTER — Other Ambulatory Visit: Payer: Self-pay | Admitting: Family

## 2013-03-09 DIAGNOSIS — E119 Type 2 diabetes mellitus without complications: Secondary | ICD-10-CM

## 2013-04-05 ENCOUNTER — Ambulatory Visit (INDEPENDENT_AMBULATORY_CARE_PROVIDER_SITE_OTHER): Payer: 59 | Admitting: Internal Medicine

## 2013-04-05 ENCOUNTER — Encounter: Payer: Self-pay | Admitting: Internal Medicine

## 2013-04-05 VITALS — BP 140/92 | HR 105 | Temp 98.6°F | Resp 10 | Ht 63.75 in | Wt 147.0 lb

## 2013-04-05 DIAGNOSIS — E119 Type 2 diabetes mellitus without complications: Secondary | ICD-10-CM

## 2013-04-05 LAB — HEMOGLOBIN A1C: Hgb A1c MFr Bld: 7.3 % — ABNORMAL HIGH (ref 4.6–6.5)

## 2013-04-05 LAB — MICROALBUMIN / CREATININE URINE RATIO: Microalb Creat Ratio: 1.2 mg/g (ref 0.0–30.0)

## 2013-04-05 MED ORDER — SAXAGLIPTIN HCL 5 MG PO TABS: 5.0000 mg | ORAL_TABLET | Freq: Every day | ORAL | Status: DC

## 2013-04-05 MED ORDER — METFORMIN HCL ER 500 MG PO TB24: ORAL_TABLET | ORAL | Status: DC

## 2013-04-05 NOTE — Progress Notes (Signed)
Patient ID: [name], female   DOB: May 01, 1956, 57 y.o.   MRN: [id number]  HPI: [name] is a 57 y.o.-year-old female, referred by her PCP, CAMPBELL, PADONDA BOYD, FNP, for management of DM2, non-insulin-dependent, controlled, with complications (CAD - s/p Q wave MI 06/2012 - s/p PTCA distal RCA; PAD - R external iliac artery; peripheral neuropathy).   Patient has been diagnosed with diabetes in 2000; she has not been on insulin before. Last hemoglobin A1c was: Lab Results  Component Value Date   HGBA1C 6.8* 07/07/2012  a previous hemoglobin A1c on 05/24/2011 was 6.3%.  Pt is on a regimen of: - Kombiglyze XR 2.01-999 x2 in HS She was on Avandia.  Pt checks her sugars 1 a day and they are: - am: 120-140 She has a One Touch meter. One Touch Delica Lancets.  Has lows- especially now (this am) as she feels she has a viral GI inf. Lowest sugar was ?; she has hypoglycemia awareness at 80. Highest sugar was 250s.  Pt's meals are: - Breakfast: latte - Lunch: usually not eating, but when she eats, this is the largest meal of the day - Dinner: "different things" - Snacks: no She lost 50 pounds since menopause. She is working long hours, she feels very stressed and anxious with her job.  Pt does not have chronic kidney disease, last BUN/creatinine was:  Lab Results  Component Value Date   BUN 15 02/07/2013   CREATININE 0.57 02/07/2013  Her ACR was undetectable in 05/24/2011.  Last set of lipids: Lab Results  Component Value Date   CHOL 206* 09/13/2012   HDL 47.50 09/13/2012   LDLCALC 144 08/15/2008   LDLDIRECT 151.1 09/13/2012   TRIG 110.0 09/13/2012   CHOLHDL 4 09/13/2012   Pt's last eye exam was in >2 years. No DR reportedly. Has blurry vision occasionally. Has numbness and tingling in her legs. As generalized muscle pain, which she partly attributes to the statin. She cannot afford coenzyme Q10.  I reviewed her chart and she also has a history of hyperlipidemia - on  Pitavastatin 2 mg, depression/anxiety, history of leukocytosis, GERD, chronic gingivitis, fibromyalgia, smoking one pack a day for 40 years. Last TSH was 0.69 on 09/13/2012. I also reviewed previous note from Dr. Juluis Rainier from 07/19/2012, at that time her diabetes was controlled. She has previously transferred her medical care from [facility] to [place]. She has now transferred to [facility].  Pt has FH of DM in 2 sons, father, mother.  Has a Counsellor now: [facility] Fax [phone]  ROS: Constitutional: + weight loss, +  fatigue, + both subjective hyperthermia/hypothermia, + poor sleep Eyes: + occas. blurry vision, no xerophthalmia ENT: no sore throat, no nodules palpated in throat, + dysphagia/no odynophagia, +hoarseness, + decreased hearing Cardiovascular: no CP/SOB/palpitations/+ leg swelling Respiratory: + cough/+ SOB Gastrointestinal: +N/no V/D/C, + GERD Musculoskeletal: +  Muscle/+ joint aches Skin: no rashes; + easy bruising, + excessive hair growth Neurological: + tremors/no numbness/tingling/dizziness Psychiatric: + depression/+ anxiety, including anxiety attacks Low libido  Past Medical History  Diagnosis Date  . Diabetes mellitus   . Fibromyalgia   . Coronary artery disease   . Anxiety   . Depression   . Asthma   . Acute MI, inferior wall, initial episode of care 07/08/2012    Cath-07/08/11 -distal RCA stent DES (99%), LAD 20-30%. EF 50% inferior hypokinesis  (infarct)  . Tobacco use disorder 07/08/2012  . Leukocytosis   . Monocytosis   . Peripheral  arterial disease 01/23/2013   Past Surgical History  Procedure Laterality Date  . Cardiac catheterization     History   Social History  . Marital Status: Married    Spouse Name: N/A    Number of Children: 3  . Years of Education: N/A   Occupational History  .  [employer]   Social History Main Topics  . Smoking status: Current Every Day Smoker -- 1.50 packs/day  .  Smokeless tobacco: Not on file  . Alcohol Use: Yes     Comment: Occasionally  . Drug Use: No  . Sexually Active: Yes   Other Topics Concern  . Not on file   Social History Narrative   Lives with husband.     Current Outpatient Prescriptions on File Prior to Visit  Medication Sig Dispense Refill  . aspirin 81 MG tablet Take 1 tablet (81 mg total) by mouth 2 (two) times daily.      . busPIRone (BUSPAR) 10 MG tablet Take 15 mg by mouth 2 (two) times daily.       . clopidogrel (PLAVIX) 75 MG tablet Take 1 tablet (75 mg total) by mouth daily.  30 tablet  11  . cyclobenzaprine (FLEXERIL) 10 MG tablet Take 1 tablet (10 mg total) by mouth at bedtime as needed.  30 tablet  3  . isosorbide mononitrate (IMDUR) 30 MG 24 hr tablet Take 1 tablet (30 mg total) by mouth daily.  90 tablet  3  . nitroGLYCERIN (NITROSTAT) 0.4 MG SL tablet Place 1 tablet (0.4 mg total) under the tongue every 5 (five) minutes x 3 doses as needed for chest pain.  30 tablet  12  . Pitavastatin Calcium (LIVALO) 2 MG TABS Take 2 mg by mouth at bedtime.      . Saxagliptin-Metformin (KOMBIGLYZE XR) 2.01-999 MG TB24 Take 2 tablets by mouth at bedtime.       No current facility-administered medications on file prior to visit.   Allergies  Allergen Reactions  . Statins     SEVERE MUSCLE PAIN  . Ace Inhibitors Cough    Patient refuses to take meds   Family History  Problem Relation Age of Onset  . Kidney disease Mother   . Diabetes Mother     DM Type II  . Diabetes Father     IDDM  . Heart disease Father   . Cancer Father     bone cancer died at 22  . Cancer Sister     bone cancer at age 61  . Multiple sclerosis Daughter   . Diabetes Son 2    DM Type I  . Heart disease Maternal Grandfather   . Heart disease Paternal Grandmother   . Diabetes Son 13    DM Type I   PE: BP 140/92  Pulse 105  Temp(Src) 98.6 F (37 C) (Oral)  Resp 10  Ht 5' 3.75" (1.619 m)  Wt 147 lb (66.679 kg)  BMI 25.44 kg/m2  SpO2 97% Wt  Readings from Last 3 Encounters:  04/05/13 147 lb (66.679 kg)  03/06/13 149 lb 1.9 oz (67.64 kg)  02/07/13 152 lb (68.947 kg)   Constitutional: normal weight, in NAD, but appears flushed, anxious Eyes: PERRLA, EOMI, no exophthalmos ENT: moist mucous membranes, no thyromegaly, no cervical lymphadenopathy Cardiovascular: RRR, No MRG Respiratory: CTA B Gastrointestinal: abdomen soft, NT, ND, BS+ Musculoskeletal: no deformities, strength intact in all 4 Skin: moist, warm, no rashes Neurological: no tremor with outstretched hands, DTR normal in  all 4  ASSESSMENT: 1. DM2, non-insulin-dependent, controlled, with complications - CAD - s/p Q wave MI 06/2012 - s/p PTCA distal RCA - Dr. Kirke Corin - PAD - R external iliac artery - improved with nitrates - peripheral neuropathy  PLAN:  1.  Patient with well-controlled diabetes at the last check 7 months ago, but with cardiovascular complications along with peripheral neuropathy. She is on a regimen of metformin and a DPP4 inhibitor, in the form of a combination pill that she takes at night. She does not check sugars throughout the day, only in a.m., when they are 120-140.  - Since we do not have a recent hemoglobin A1c and a sugar log, it is difficult to modify the treatment for now, so I have asked the patient to start recording her sugars once a day, rotating check times, and bring her log at the next visit in about 6 weeks from now. - - I believe one of the changes that we can do, in case her hemoglobin A1c is higher today, is to split the combination pill into metformin XR and Saxagliptin, with the latter taken in am, to give better postprandial coverage. - given sugar log and advised how to fill it and to bring it at next appt - check once a day, rotating check times - given foot care handout and explained the principles - given instructions for hypoglycemia management "15-15 rule" - would check a hemoglobin A1c and the microalbumin/creatinine ratio  today - Return to clinic in 6 weeks with sugar log  Office Visit on 04/05/2013  Component Date Value Range Status  . Hemoglobin A1C 04/05/2013 7.3* 4.6 - 6.5 % Final   Glycemic Control Guidelines for People with Diabetes:Non Diabetic:  <6%Goal of Therapy: <7%Additional Action Suggested:  >8%   . Microalb, Ur 04/05/2013 1.4  0.0 - 1.9 mg/dL Final  . Creatinine,U 16/06/9603 121.4   Final  . Microalb Creat Ratio 04/05/2013 1.2  0.0 - 30.0 mg/g Final   Hba1C has increased >> will advise her to split Kombiglyze into Metformin XR and Saxagliptin.

## 2013-04-05 NOTE — Patient Instructions (Addendum)
Please return in 1.5 months with your sugar log.  Continue current regimen for now.  PATIENT INSTRUCTIONS FOR TYPE 2 DIABETES:  DIET AND EXERCISE Diet and exercise is an important part of diabetic treatment.  We recommended aerobic exercise in the form of brisk walking (working between 40-60% of maximal aerobic capacity, similar to brisk walking) for 150 minutes per week (such as 30 minutes five days per week) along with 3 times per week performing 'resistance' training (using various gauge rubber tubes with handles) 5-10 exercises involving the major muscle groups (upper body, lower body and core) performing 10-15 repetitions (or near fatigue) each exercise. Start at half the above goal but build slowly to reach the above goals. If limited by weight, joint pain, or disability, we recommend daily walking in a swimming pool with water up to waist to reduce pressure from joints while allow for adequate exercise.    BLOOD GLUCOSES Monitoring your blood glucoses is important for continued management of your diabetes. Please check your blood glucoses 2-4 times a day: fasting, before meals and at bedtime (you can rotate these measurements - e.g. one day check before the 3 meals, the next day check before 2 of the meals and before bedtime, etc.   HYPOGLYCEMIA (low blood sugar) Hypoglycemia is usually a reaction to not eating, exercising, or taking too much insulin/ other diabetes drugs.  Symptoms include tremors, sweating, hunger, confusion, headache, etc. Treat IMMEDIATELY with 15 grams of Carbs:   4 glucose tablets    cup regular juice/soda   2 tablespoons raisins   4 teaspoons sugar   1 tablespoon honey Recheck blood glucose in 15 mins and repeat above if still symptomatic/blood glucose <100. Please contact our office at [phone] if you have questions about how to next handle your insulin.  RECOMMENDATIONS TO REDUCE YOUR RISK OF DIABETIC COMPLICATIONS: * Take your prescribed  MEDICATION(S). * Follow a DIABETIC diet: Complex carbs, fiber rich foods, heart healthy fish twice weekly, (monounsaturated and polyunsaturated) fats * AVOID saturated/trans fats, high fat foods, >2,300 mg salt per day. * EXERCISE at least 5 times a week for 30 minutes or preferably daily.  * DO NOT SMOKE OR DRINK more than 1 drink a day. * Check your FEET every day. Do not wear tightfitting shoes. Contact us if you develop an ulcer * See your EYE doctor once a year or more if needed * Get a FLU shot once a year * Get a PNEUMONIA vaccine once before and once after age 63 years  GOALS:  * Your Hemoglobin A1c of <7%  * Your Systolic BP should be 140 or lower  * Your Diastolic BP should be 80 or lower  * Your HDL (Good Cholesterol) should be 40 or higher  * Your LDL (Bad Cholesterol) should be 100 or lower  * Your Triglycerides should be 150 or lower  * Your Urine microalbumin (kidney function) should be <30 * Your Body Mass Index should be 25 or lower   We will be glad to help you achieve these goals. Our telephone number is: [phone].

## 2013-05-01 ENCOUNTER — Encounter: Payer: Self-pay | Admitting: Family

## 2013-05-01 ENCOUNTER — Ambulatory Visit (INDEPENDENT_AMBULATORY_CARE_PROVIDER_SITE_OTHER): Payer: 59 | Admitting: Family

## 2013-05-01 VITALS — BP 132/84 | HR 96 | Wt 147.0 lb

## 2013-05-01 DIAGNOSIS — N390 Urinary tract infection, site not specified: Secondary | ICD-10-CM

## 2013-05-01 DIAGNOSIS — R300 Dysuria: Secondary | ICD-10-CM

## 2013-05-01 LAB — POCT URINALYSIS DIPSTICK
Bilirubin, UA: NEGATIVE
Glucose, UA: NEGATIVE
Nitrite, UA: POSITIVE
pH, UA: 5.5

## 2013-05-01 MED ORDER — SULFAMETHOXAZOLE-TRIMETHOPRIM 800-160 MG PO TABS: 1.0000 | ORAL_TABLET | Freq: Two times a day (BID) | ORAL | Status: AC

## 2013-05-01 NOTE — Progress Notes (Signed)
Subjective:    Patient ID: [name], female    DOB: Jan 26, 1956, 57 y.o.   MRN: [id number]  HPI  57 year old white female, smoker is in today with complaints of burning with urination, frequency and urgency to urinate x2 days and worsening. She has a history of asymptomatic urinary tract infections. Denies any fever, chills.  Review of Systems  Constitutional: Negative.   Respiratory: Negative.   Gastrointestinal: Negative.   Genitourinary: Positive for dysuria, urgency and hematuria.  Musculoskeletal: Negative.   Skin: Negative.   Neurological: Negative.   Hematological: Negative.   Psychiatric/Behavioral: Negative.    Past Medical History  Diagnosis Date  . Diabetes mellitus   . Fibromyalgia   . Coronary artery disease   . Anxiety   . Depression   . Asthma   . Acute MI, inferior wall, initial episode of care 07/08/2012    Cath-07/08/11 -distal RCA stent DES (99%), LAD 20-30%. EF 50% inferior hypokinesis  (infarct)  . Tobacco use disorder 07/08/2012  . Leukocytosis   . Monocytosis   . Peripheral arterial disease 01/23/2013    History   Social History  . Marital Status: Married    Spouse Name: N/A    Number of Children: 3  . Years of Education: N/A   Occupational History  .  [employer]   Social History Main Topics  . Smoking status: Current Every Day Smoker -- 1.50 packs/day  . Smokeless tobacco: Not on file  . Alcohol Use: Yes     Comment: Occasionally  . Drug Use: No  . Sexually Active: Yes   Other Topics Concern  . Not on file   Social History Narrative   Lives with husband.      Past Surgical History  Procedure Laterality Date  . Cardiac catheterization      Family History  Problem Relation Age of Onset  . Kidney disease Mother   . Diabetes Mother     DM Type II  . Diabetes Father     IDDM  . Heart disease Father   . Cancer Father     bone cancer died at 11  . Cancer Sister     bone cancer at age 67  . Multiple sclerosis  Daughter   . Diabetes Son 2    DM Type I  . Heart disease Maternal Grandfather   . Heart disease Paternal Grandmother   . Diabetes Son 30    DM Type I    Allergies  Allergen Reactions  . Statins     SEVERE MUSCLE PAIN  . Ace Inhibitors Cough    Patient refuses to take meds    Current Outpatient Prescriptions on File Prior to Visit  Medication Sig Dispense Refill  . aspirin 81 MG tablet Take 1 tablet (81 mg total) by mouth 2 (two) times daily.      . busPIRone (BUSPAR) 10 MG tablet Take 15 mg by mouth 2 (two) times daily.       . clopidogrel (PLAVIX) 75 MG tablet Take 1 tablet (75 mg total) by mouth daily.  30 tablet  11  . cyclobenzaprine (FLEXERIL) 10 MG tablet Take 1 tablet (10 mg total) by mouth at bedtime as needed.  30 tablet  3  . isosorbide mononitrate (IMDUR) 30 MG 24 hr tablet Take 1 tablet (30 mg total) by mouth daily.  90 tablet  3  . metFORMIN (GLUCOPHAGE XR) 500 MG 24 hr tablet Take 4 tablets daily with supper  120 tablet  3  . nitroGLYCERIN (NITROSTAT) 0.4 MG SL tablet Place 1 tablet (0.4 mg total) under the tongue every 5 (five) minutes x 3 doses as needed for chest pain.  30 tablet  12  . Pitavastatin Calcium (LIVALO) 2 MG TABS Take 2 mg by mouth at bedtime.      . saxagliptin HCl (ONGLYZA) 5 MG TABS tablet Take 1 tablet (5 mg total) by mouth daily.  30 tablet  3   No current facility-administered medications on file prior to visit.    BP 132/84  Pulse 96  Wt 147 lb (66.679 kg)  BMI 25.44 kg/m2chart    Objective:   Physical Exam  Constitutional: She is oriented to person, place, and time. She appears well-developed and well-nourished.  Neck: Normal range of motion. Neck supple.  Cardiovascular: Normal rate, regular rhythm and normal heart sounds.   Pulmonary/Chest: Effort normal and breath sounds normal.  Abdominal: Soft. Bowel sounds are normal.  Musculoskeletal: Normal range of motion.  Neurological: She is alert and oriented to person, place, and time.   Skin: Skin is warm and dry.  Psychiatric: She has a normal mood and affect.          Assessment & Plan:  Assessment: 1. Dysuria 2 Urinary tract infection  Plan: Bactrim DS 1 tablet twice a day x7 days. Call the office with any questions or concerns. Avoid caffeine. Recheck as scheduled, and as needed.

## 2013-05-01 NOTE — Patient Instructions (Addendum)
Urinary Tract Infection  Urinary tract infections (UTIs) can develop anywhere along your urinary tract. Your urinary tract is your body's drainage system for removing wastes and extra water. Your urinary tract includes two kidneys, two ureters, a bladder, and a urethra. Your kidneys are a pair of bean-shaped organs. Each kidney is about the size of your fist. They are located below your ribs, one on each side of your spine.  CAUSES  Infections are caused by microbes, which are microscopic organisms, including fungi, viruses, and bacteria. These organisms are so small that they can only be seen through a microscope. Bacteria are the microbes that most commonly cause UTIs.  SYMPTOMS   Symptoms of UTIs may vary by age and gender of the patient and by the location of the infection. Symptoms in young women typically include a frequent and intense urge to urinate and a painful, burning feeling in the bladder or urethra during urination. Older women and men are more likely to be tired, shaky, and weak and have muscle aches and abdominal pain. A fever may mean the infection is in your kidneys. Other symptoms of a kidney infection include pain in your back or sides below the ribs, nausea, and vomiting.  DIAGNOSIS  To diagnose a UTI, your caregiver will ask you about your symptoms. Your caregiver also will ask to provide a urine sample. The urine sample will be tested for bacteria and white blood cells. White blood cells are made by your body to help fight infection.  TREATMENT   Typically, UTIs can be treated with medication. Because most UTIs are caused by a bacterial infection, they usually can be treated with the use of antibiotics. The choice of antibiotic and length of treatment depend on your symptoms and the type of bacteria causing your infection.  HOME CARE INSTRUCTIONS   If you were prescribed antibiotics, take them exactly as your caregiver instructs you. Finish the medication even if you feel better after you  have only taken some of the medication.   Drink enough water and fluids to keep your urine clear or pale yellow.   Avoid caffeine, tea, and carbonated beverages. They tend to irritate your bladder.   Empty your bladder often. Avoid holding urine for long periods of time.   Empty your bladder before and after sexual intercourse.   After a bowel movement, women should cleanse from front to back. Use each tissue only once.  SEEK MEDICAL CARE IF:    You have back pain.   You develop a fever.   Your symptoms do not begin to resolve within 3 days.  SEEK IMMEDIATE MEDICAL CARE IF:    You have severe back pain or lower abdominal pain.   You develop chills.   You have nausea or vomiting.   You have continued burning or discomfort with urination.  MAKE SURE YOU:    Understand these instructions.   Will watch your condition.   Will get help right away if you are not doing well or get worse.  Document Released: 06/23/2005 Document Revised: 03/14/2012 Document Reviewed: 10/22/2011  [employer] Patient Information 2014 [employer].

## 2013-05-11 LAB — HM DIABETES EYE EXAM

## 2013-05-21 ENCOUNTER — Ambulatory Visit: Payer: 59 | Admitting: Internal Medicine

## 2013-06-08 ENCOUNTER — Ambulatory Visit: Payer: 59 | Admitting: Internal Medicine

## 2013-06-26 ENCOUNTER — Encounter: Payer: Self-pay | Admitting: Internal Medicine

## 2013-06-26 ENCOUNTER — Ambulatory Visit (INDEPENDENT_AMBULATORY_CARE_PROVIDER_SITE_OTHER): Payer: 59 | Admitting: Internal Medicine

## 2013-06-26 VITALS — BP 122/74 | HR 90 | Temp 98.0°F | Resp 12 | Wt 141.0 lb

## 2013-06-26 DIAGNOSIS — E119 Type 2 diabetes mellitus without complications: Secondary | ICD-10-CM

## 2013-06-26 NOTE — Progress Notes (Signed)
Patient ID: [name], female   DOB: [birth year], 57 y.o.   MRN: [id number]  HPI: [name] is a 57 y.o.-year-old female, returning for follow up for DM2, dx 2000, non-insulin-dependent, controlled, with complications (CAD - s/p Q wave MI 06/2012 - s/p PTCA distal RCA; PAD - R external iliac artery; peripheral neuropathy).   Patient has been diagnosed with diabetes in 2000; she has not been on insulin before.   Last hemoglobin A1c was:  Lab Results  Component Value Date   HGBA1C 7.3* 04/05/2013  Her previous hemoglobin A1c on 05/24/2011 was 6.3%.  Pt is on a regimen of: - Metformin XR 2000 mg at night - Onglyza 5 mg in am She was on Avandia.  Pt does not check sugars anymore. Previously: - am: 120-140 She has a One Touch meter. One Touch Delica Lancets.  Pt's meals are more sparse meals, skips them as she does not have time to eat. She lost 50 pounds since menopause. She is working long hours, she feels very stressed and anxious with her job.  Pt does not have chronic kidney disease, last BUN/creatinine was:  Lab Results  Component Value Date   BUN 15 02/07/2013   CREATININE 0.57 02/07/2013  Her ACR was 1.2 04/2013.   Last set of lipids: Lab Results  Component Value Date   CHOL 206* 09/13/2012   HDL 47.50 09/13/2012   LDLCALC  Value: 144        Total Cholesterol/HDL:CHD Risk Coronary Heart Disease Risk Table                     Men   Women  1/2 Average Risk   3.4   3.3* 08/15/2008   LDLDIRECT 151.1 09/13/2012   TRIG 110.0 09/13/2012   CHOLHDL 4 09/13/2012   Pt's last eye exam was 04/2013. No DR reportedly. Has blurry vision occasionally. Has numbness and tingling in her legs. As generalized muscle pain, which she partly attributes to the statin. She cannot afford coenzyme Q10.  I reviewed her chart and she also has a history of hyperlipidemia - on Pitavastatin 2 mg, depression/anxiety, history of leukocytosis, GERD, chronic gingivitis, fibromyalgia, smoking one  pack a day for 40 years. Last TSH was 0.69 on 09/13/2012. I also reviewed previous note from Dr. Juluis Rainier from 07/19/2012, at that time her diabetes was controlled. She has previously transferred her medical care from [facility] to [place]. She has now transferred to [facility].  Has a Counsellor now: [facility] Fax [phone]  ROS: Constitutional: + weight loss, +  fatigue,  + poor sleep Eyes: + occas. blurry vision, no xerophthalmia ENT: no sore throat, no nodules palpated in throat, + dysphagia/no odynophagia, +hoarseness, + decreased hearing Cardiovascular: no CP/SOB/palpitations/+ leg swelling Respiratory: no cough/+ SOB Gastrointestinal: no N/no V/D/C Musculoskeletal: +  Muscle/+ joint aches Skin: no rashes, + easy bruising Neurological: no  tremors/no numbness/tingling/dizziness  I reviewed pt's medications, allergies, PMH, social hx, family hx and no changes required, except as mentioned above. Also, started Brintellix.  PE: BP 122/74  Pulse 90  Temp(Src) 98 F (36.7 C) (Oral)  Resp 12  Wt 141 lb (63.957 kg)  BMI 24.4 kg/m2  SpO2 96% Wt Readings from Last 3 Encounters:  06/26/13 141 lb (63.957 kg)  05/01/13 147 lb (66.679 kg)  04/05/13 147 lb (66.679 kg)   Constitutional: normal weight, in NAD, but appears anxious Eyes: PERRLA, EOMI, no exophthalmos ENT: moist mucous membranes, no thyromegaly, no cervical  lymphadenopathy Cardiovascular: RRR, No MRG Respiratory: CTA B Gastrointestinal: abdomen soft, NT, ND, BS+ Musculoskeletal: no deformities, strength intact in all 4 Skin: moist, warm, no rashes Neurological: no tremor with outstretched hands, DTR normal in all 4  ASSESSMENT: 1. DM2, non-insulin-dependent, controlled, with complications - CAD - s/p Q wave MI 06/2012 - s/p PTCA distal RCA - Dr. Kirke Corin - PAD - R external iliac artery - improved with nitrates - peripheral neuropathy  PLAN:  1.  Patient with fairly  well-controlled diabetes, but with cardiovascular complications along with peripheral neuropathy. She is on a regimen of metformin and a DPP4 inhibitor. She did not check sugars since last visit.   - I will not change the regimen today, but strongly advised her to check sugars throughout the day, at least once a day - given sugar log and advised how to fill it and to bring it at next appt - check once a day, rotating check times - will check a hemoglobin A1c at next visit - advised to not skip meals anymore - lost 6 lbs since last visit! - Return to clinic in 1 mo with sugar log - refuses flu vaccine today

## 2013-06-26 NOTE — Patient Instructions (Signed)
Please continue the current diabetes regimen. Please check sugars once a day, rotating check times.  Return in a month with your sugar log.

## 2013-06-29 ENCOUNTER — Encounter: Payer: Self-pay | Admitting: Cardiology

## 2013-06-29 ENCOUNTER — Ambulatory Visit (INDEPENDENT_AMBULATORY_CARE_PROVIDER_SITE_OTHER): Payer: 59 | Admitting: Cardiology

## 2013-06-29 VITALS — BP 100/70 | HR 92 | Ht 64.0 in | Wt 137.0 lb

## 2013-06-29 DIAGNOSIS — I2510 Atherosclerotic heart disease of native coronary artery without angina pectoris: Secondary | ICD-10-CM

## 2013-06-29 MED ORDER — ISOSORBIDE MONONITRATE ER 120 MG PO TB24: 120.0000 mg | ORAL_TABLET | Freq: Every day | ORAL | Status: DC

## 2013-06-29 NOTE — Progress Notes (Signed)
HPI The patient presents for followup after recent hospitalization with a non-Q-wave myocardial infarction..  Catheterization demonstrated 20% proximal stenosis and 30% mid stenosis in the LAD. The circumflex had 20% proximal stenosis. The right coronary artery and 20% stenosis with distal vessel 99% stenosis. The patient underwent PTCA and stenting of the distal RCA.  She was having significant leg pain. I sent her to see Dr. Kirke Corin.  He did lower extremity angiography which demonstrated, initially, severe disease in right external iliac artery. However, this improved significantly with NTG with a systolic gradient of only 10-15 mm hg. She was started on Imdur and initially had some improvement in her symptoms. However, this would not last very long. She's also having some discomfort in her right arm as well. She says her leg pain happens with minimal activity. She's been having some palpitations. However, she's not describing any chest pressure, neck or arm discomfort. She has no new shortness of breath, PND or orthopnea. She's under a lot of emotional stress at work. She does continue to smoke cigarettes.    Allergies  Allergen Reactions  . Statins     SEVERE MUSCLE PAIN  . Ace Inhibitors Cough    Patient refuses to take meds    Current Outpatient Prescriptions  Medication Sig Dispense Refill  . aspirin 81 MG tablet Take 1 tablet (81 mg total) by mouth 2 (two) times daily.      . busPIRone (BUSPAR) 10 MG tablet Take 15 mg by mouth 2 (two) times daily.       . busPIRone (BUSPAR) 15 MG tablet Take 15 mg by mouth 2 (two) times daily.      . clopidogrel (PLAVIX) 75 MG tablet Take 1 tablet (75 mg total) by mouth daily.  30 tablet  11  . cyclobenzaprine (FLEXERIL) 10 MG tablet Take 1 tablet (10 mg total) by mouth at bedtime as needed.  30 tablet  3  . isosorbide mononitrate (IMDUR) 30 MG 24 hr tablet Take 1 tablet (30 mg total) by mouth daily.  90 tablet  3  . metFORMIN (GLUCOPHAGE XR) 500 MG 24  hr tablet Take 4 tablets daily with supper  120 tablet  3  . nitroGLYCERIN (NITROSTAT) 0.4 MG SL tablet Place 1 tablet (0.4 mg total) under the tongue every 5 (five) minutes x 3 doses as needed for chest pain.  30 tablet  12  . saxagliptin HCl (ONGLYZA) 5 MG TABS tablet Take 1 tablet (5 mg total) by mouth daily.  30 tablet  3  . Vortioxetine HBr (BRINTELLIX) 10 MG TABS Take 1 tablet by mouth daily.       No current facility-administered medications for this visit.    Past Medical History  Diagnosis Date  . Diabetes mellitus   . Fibromyalgia   . Coronary artery disease   . Anxiety   . Depression   . Asthma   . Acute MI, inferior wall, initial episode of care 07/08/2012    Cath-07/08/11 -distal RCA stent DES (99%), LAD 20-30%. EF 50% inferior hypokinesis  (infarct)  . Tobacco use disorder 07/08/2012  . Leukocytosis   . Monocytosis   . Peripheral arterial disease 01/23/2013    Past Surgical History  Procedure Laterality Date  . Cardiac catheterization      ROS:  As stated in the HPI and negative for all other systems.  PHYSICAL EXAM Ht 5\' 4"  (1.626 m)  Wt 137 lb (62.143 kg)  BMI 23.5 kg/m2 GENERAL:  Well appearing NECK:  No jugular venous distention, waveform within normal limits, carotid upstroke brisk and symmetric, soft right carotid bruit, no thyromegaly LUNGS:  Clear to auscultation bilaterally CHEST:  Unremarkable HEART:  PMI not displaced or sustained,S1 and S2 within normal limits, no S3, no S4, no clicks, no rubs, no murmurs ABD:  Flat, positive bowel sounds normal in frequency in pitch, no bruits, no rebound, no guarding, no midline pulsatile mass, no hepatomegaly, no splenomegaly EXT:  2 plus pulses upper, 1+ dorsalis pedis bilateral, absent posterior tibialis bilateral, no edema, no cyanosis no clubbing PSYCH:  Cognitively intact, oriented to person place and time  EKG:  Sinus rhythm, rate 92, axis within normal limits, intervals within normal limits, no acute ST-T  wave changes.  Old inferior infarct. 06/29/2013   ASSESSMENT AND PLAN  CAD  The patient has no new sypmtoms.  No further cardiovascular testing is indicated.  We will continue with aggressive risk reduction and meds as listed.    Leg pain He did see Dr. Kirke Corin and she had nonobstructive disease but significant spasm. Given this we're going to manage her medically. I'm going to increase the Imdur to 120 mg. If this does not work I probably will switch to Pletal. We discussed the absolute need to stop tobacco.   Tobacco abuse We discussed this again today. She is now considering whether she can stop.  Dyslipidemia She has been intolerant of medications other than Pitavistatin.  She will continue on this medicine.  Carotid stenosis She has some mild left greater than right carotid stenosis. We will follow this up again next April.  Arm pain This could represent spasm. However, it also could be related to a neurologic problem. I have suggested possible followup with a neurosurgeon.

## 2013-06-29 NOTE — Patient Instructions (Addendum)
Please increase Imdur to 120 mg a day Continue all other medications as listed.  Follow up with Dr Antoine Poche in 6 weeks.

## 2013-07-10 ENCOUNTER — Encounter: Payer: Self-pay | Admitting: Cardiology

## 2013-07-16 ENCOUNTER — Ambulatory Visit (INDEPENDENT_AMBULATORY_CARE_PROVIDER_SITE_OTHER): Payer: 59 | Admitting: Family Medicine

## 2013-07-16 ENCOUNTER — Encounter: Payer: Self-pay | Admitting: Family Medicine

## 2013-07-16 VITALS — BP 118/80 | HR 65 | Temp 97.8°F | Wt 145.0 lb

## 2013-07-16 DIAGNOSIS — H04322 Acute dacryocystitis of left lacrimal passage: Secondary | ICD-10-CM

## 2013-07-16 DIAGNOSIS — H04329 Acute dacryocystitis of unspecified lacrimal passage: Secondary | ICD-10-CM

## 2013-07-16 MED ORDER — TOBRAMYCIN 0.3 % OP SOLN: 1.0000 [drp] | OPHTHALMIC | Status: DC

## 2013-07-16 NOTE — Progress Notes (Signed)
  Subjective:    Patient ID: [name], female    DOB: 04-19-1956, 57 y.o.   MRN: [id number]  HPI Acute visit Patient seen with left eye irritation. 2 days ago she started having some swelling and mild pain along the inner canthus region. This was followed by some mild swelling of the upper mid. She's had a little bit of crusted drainage. No visual changes. No fevers or chills. She has not tried any warm compresses. No other alleviating factors. No aggravating factors. No recent sinusitis symptoms.  Past Medical History  Diagnosis Date  . Diabetes mellitus   . Fibromyalgia   . Coronary artery disease   . Anxiety   . Depression   . Asthma   . Acute MI, inferior wall, initial episode of care 07/08/2012    Cath-07/08/11 -distal RCA stent DES (99%), LAD 20-30%. EF 50% inferior hypokinesis  (infarct)  . Tobacco use disorder 07/08/2012  . Leukocytosis   . Monocytosis   . Peripheral arterial disease 01/23/2013   Past Surgical History  Procedure Laterality Date  . Cardiac catheterization      reports that she has been smoking.  She does not have any smokeless tobacco history on file. She reports that she drinks alcohol. She reports that she does not use illicit drugs. family history includes Cancer in her father and sister; Diabetes in her father and mother; Diabetes (age of onset: 57) in her son; Diabetes (age of onset: 2) in her son; Heart disease in her father, maternal grandfather, and paternal grandmother; Kidney disease in her mother; Multiple sclerosis in her daughter. Allergies  Allergen Reactions  . Statins     SEVERE MUSCLE PAIN  . Ace Inhibitors Cough    Patient refuses to take meds      Review of Systems  Constitutional: Negative for fever and chills.  Eyes: Positive for discharge, redness and itching. Negative for photophobia and visual disturbance.  Skin: Negative for rash.       Objective:   Physical Exam  Constitutional: She appears well-developed and  well-nourished.  Eyes:  Left eye reveals some mild erythema and minimal tenderness inner canthus region. She has some mild erythema and mild swelling of the left upper lid. Conjunctiva appears normal. Minimal yellowish crusted drainage inner canthus. No rashes. Pupils equal reactive to light. Fundi benign. Cornea normal.  Cardiovascular: Normal rate and regular rhythm.   Pulmonary/Chest: Effort normal and breath sounds normal. No respiratory distress. She has no wheezes. She has no rales.          Assessment & Plan:  Dacryocystitis left eye. Warm compresses several times daily. Tobramycin ophthalmic solution every 4 hours while awake. Touch base in 3-4 days if not resolving

## 2013-07-16 NOTE — Patient Instructions (Addendum)
Dacryocystitis  Dacryocystitis is an infection of the tear sac (lacrimal sac). The lacrimal sac lies between the inner corner of the eyelids and the nose. The glands of the eyelids produce tears. This is to keep the surface of the eye wet and protect it. These tears drain from the surface of the eyes through a duct in each lid (lacrimal ducts), then through the lacrimal sac into the nose. The tears are then swallowed. If the lacrimal sacs become blocked, bacteria begin to buildup. The lacrimal sacs can become infected. Dacryocystitis may be sudden (acute) or long-lasting (chronic). This problem is most common in infants because the tear ducts are not fully developed and clog easily. In that case, infants may have episodes of tearing and infection. However, in most cases, the problem gets better as the infant grows. CAUSES  The cause is often unknown. Known causes can include:  Malformation of the lacrimal sac.  Injury to the eye.  Eye infection.  Injury or inflammation of the nasal passages. SYMPTOMS   Usually only one eye is involved.  Excessive tearing and watering from the involved eye.  Tenderness, redness, and swelling of the lower lid near the nose.  A sore, red, inflamed bump on the inner corner of the lower lid. DIAGNOSIS  A diagnosis is made after an eye exam to see how much blockage is present and if the surface of the eye is also infected. A culture of the fluid from the lacrimal sac may be examined to find if a specific infection is present. TREATMENT  Treatment depends on:   The person's age.  Whether or not the infection is chronic or acute.  The amount of blockage is present. Additional treatment Sometimes massaging the area (starting from the inside of the eye and gently massaging down toward the nose) will improve the condition, combined with antibiotic eyedrops or ointments. If massaging the area does not work, it may be necessary to probe the ducts and open up the  drainage system. While this is easily done in the office in adults, probing usually has to be done under general anesthesia in infants.  If the blockage cannot be cleared by probing, surgery may be needed under general anesthesia to create a direct opening for tears to flow between the lacrimal sac and the inside of the nose (dacryocystorhinostomy, DCR). HOME CARE INSTRUCTIONS   Use any antibiotic eyedrops, ointment, or pills as directed by the caregiver. Finish all medicines even if the symptoms start to get better.  Massage the lacrimal sac as directed by the caregiver. SEEK IMMEDIATE MEDICAL CARE IF:   There is increased pain, swelling, redness, or drainage from the eye.  Muscle aches, chills, or a general sick feeling develop.  A fever or persistent symptoms develop for more than 2 3 days.  The fever and symptoms suddenly get worse. MAKE SURE YOU:   Understand these instructions.  Will watch your condition.  Will get help right away if you are not doing well or get worse. Document Released: 09/10/2000 Document Revised: 06/07/2012 Document Reviewed: 02/14/2012 [facility] Patient Information 2014 [place], [place].  Use warm compresses to eye several times daily.

## 2013-07-18 ENCOUNTER — Other Ambulatory Visit: Payer: Self-pay

## 2013-07-18 ENCOUNTER — Encounter: Payer: Self-pay | Admitting: Family Medicine

## 2013-07-18 MED ORDER — AMOXICILLIN-POT CLAVULANATE 875-125 MG PO TABS: 1.0000 | ORAL_TABLET | Freq: Two times a day (BID) | ORAL | Status: DC

## 2013-07-18 NOTE — Telephone Encounter (Signed)
OK to call in Augmentin 875 mg one po bid for 10 days. Make sure she is using warm compresses several times daily. Clindamycin would not be great choice for sinus infection as an only antibiotic.

## 2013-08-02 ENCOUNTER — Other Ambulatory Visit: Payer: Self-pay

## 2013-08-04 ENCOUNTER — Other Ambulatory Visit: Payer: Self-pay | Admitting: Internal Medicine

## 2013-08-13 ENCOUNTER — Other Ambulatory Visit: Payer: Self-pay | Admitting: Family

## 2013-08-13 ENCOUNTER — Ambulatory Visit (INDEPENDENT_AMBULATORY_CARE_PROVIDER_SITE_OTHER): Payer: 59 | Admitting: Family

## 2013-08-13 ENCOUNTER — Encounter: Payer: Self-pay | Admitting: Family

## 2013-08-13 ENCOUNTER — Ambulatory Visit: Payer: 59 | Admitting: Cardiology

## 2013-08-13 VITALS — BP 132/82 | HR 112 | Wt 140.0 lb

## 2013-08-13 DIAGNOSIS — F430 Acute stress reaction: Secondary | ICD-10-CM

## 2013-08-13 DIAGNOSIS — J019 Acute sinusitis, unspecified: Secondary | ICD-10-CM

## 2013-08-13 DIAGNOSIS — F17200 Nicotine dependence, unspecified, uncomplicated: Secondary | ICD-10-CM

## 2013-08-13 DIAGNOSIS — IMO0001 Reserved for inherently not codable concepts without codable children: Secondary | ICD-10-CM

## 2013-08-13 DIAGNOSIS — Z720 Tobacco use: Secondary | ICD-10-CM

## 2013-08-13 DIAGNOSIS — E1149 Type 2 diabetes mellitus with other diabetic neurological complication: Secondary | ICD-10-CM

## 2013-08-13 DIAGNOSIS — E1140 Type 2 diabetes mellitus with diabetic neuropathy, unspecified: Secondary | ICD-10-CM

## 2013-08-13 DIAGNOSIS — E1142 Type 2 diabetes mellitus with diabetic polyneuropathy: Secondary | ICD-10-CM

## 2013-08-13 DIAGNOSIS — M797 Fibromyalgia: Secondary | ICD-10-CM

## 2013-08-13 LAB — CBC WITH DIFFERENTIAL/PLATELET
Eosinophils Absolute: 0.5 10*3/uL (ref 0.0–0.7)
Eosinophils Relative: 2.7 % (ref 0.0–5.0)
Lymphocytes Relative: 18.2 % (ref 12.0–46.0)
MCHC: 33.4 g/dL (ref 30.0–36.0)
MCV: 90.4 fl (ref 78.0–100.0)
Monocytes Absolute: 1 10*3/uL (ref 0.1–1.0)
Neutrophils Relative %: 72.8 % (ref 43.0–77.0)
Platelets: 291 10*3/uL (ref 150.0–400.0)
RBC: 5.67 Mil/uL — ABNORMAL HIGH (ref 3.87–5.11)
WBC: 16.5 10*3/uL — ABNORMAL HIGH (ref 4.5–10.5)

## 2013-08-13 MED ORDER — CLOPIDOGREL BISULFATE 75 MG PO TABS: 75.0000 mg | ORAL_TABLET | Freq: Every day | ORAL | Status: DC

## 2013-08-13 MED ORDER — CLINDAMYCIN HCL 300 MG PO CAPS: 300.0000 mg | ORAL_CAPSULE | Freq: Three times a day (TID) | ORAL | Status: DC

## 2013-08-13 MED ORDER — ISOSORBIDE MONONITRATE ER 120 MG PO TB24: 120.0000 mg | ORAL_TABLET | Freq: Every day | ORAL | Status: DC

## 2013-08-13 MED ORDER — CYCLOBENZAPRINE HCL 10 MG PO TABS: 10.0000 mg | ORAL_TABLET | Freq: Every evening | ORAL | Status: DC | PRN

## 2013-08-13 NOTE — Progress Notes (Signed)
Subjective:    Patient ID: [name], female    DOB: Nov 30, 1955, 57 y.o.   MRN: [id number]  HPI 57 year old white female, smoker, is in today with persistent sinusitis after being treated with Augmentin one month ago by Dr. Caryl Never. She reports Augmentin typically does not give her her symptoms and she discontinued medication due to upset stomach.. She continues to smoke about a pack of cigarettes per day. Continues to have cough, congestion, sinus pressure and pain.  Patient also complains of right side weakness, pain, trembling, unsteady gait has been ongoing several years but appears to be worsening now. She has a history of fibromyalgia, type 2 diabetes, peripheral arterial disease. She reports her pain as a 6-7/10. Described as cramping. Reports pain in her right shoulder, right hand, right arm, right ankle and foot. Has pain on the left side as well, however pain is much greater on the right.  Patient also has concerns of decreased anxiety and depression. Reports going through a very difficult time at work which she may lose her job. She reports she can to retain an attorney to fight allegation she's been accused of. In the meantime, she is on administrative leave. Has had more crying spells, irritability, depression and overall status.   Review of Systems  Constitutional: Negative.   HENT: Negative.   Respiratory: Negative.   Cardiovascular: Negative.   Gastrointestinal: Negative.   Endocrine: Negative.   Genitourinary: Negative.   Musculoskeletal: Negative.   Skin: Negative.   Neurological: Negative.   Hematological: Negative.   Psychiatric/Behavioral: Positive for sleep disturbance, decreased concentration and agitation. The patient is nervous/anxious.    Past Medical History  Diagnosis Date  . Diabetes mellitus   . Fibromyalgia   . Coronary artery disease   . Anxiety   . Depression   . Asthma   . Acute MI, inferior wall, initial episode of care 07/08/2012   Cath-07/08/11 -distal RCA stent DES (99%), LAD 20-30%. EF 50% inferior hypokinesis  (infarct)  . Tobacco use disorder 07/08/2012  . Leukocytosis   . Monocytosis   . Peripheral arterial disease 01/23/2013    History   Social History  . Marital Status: Married    Spouse Name: N/A    Number of Children: 3  . Years of Education: N/A   Occupational History  .  [employer]   Social History Main Topics  . Smoking status: Current Every Day Smoker -- 1.50 packs/day  . Smokeless tobacco: Not on file  . Alcohol Use: Yes     Comment: Occasionally  . Drug Use: No  . Sexual Activity: Yes   Other Topics Concern  . Not on file   Social History Narrative   Lives with husband.      Past Surgical History  Procedure Laterality Date  . Cardiac catheterization      Family History  Problem Relation Age of Onset  . Kidney disease Mother   . Diabetes Mother     DM Type II  . Diabetes Father     IDDM  . Heart disease Father   . Cancer Father     bone cancer died at 34  . Cancer Sister     bone cancer at age 9  . Multiple sclerosis Daughter   . Diabetes Son 2    DM Type I  . Heart disease Maternal Grandfather   . Heart disease Paternal Grandmother   . Diabetes Son 51    DM Type I    Allergies  Allergen Reactions  . Statins     SEVERE MUSCLE PAIN  . Ace Inhibitors Cough    Patient refuses to take meds    Current Outpatient Prescriptions on File Prior to Visit  Medication Sig Dispense Refill  . aspirin 81 MG tablet Take 1 tablet (81 mg total) by mouth 2 (two) times daily.      . busPIRone (BUSPAR) 10 MG tablet Take 15 mg by mouth 2 (two) times daily.       . busPIRone (BUSPAR) 15 MG tablet Take 15 mg by mouth 2 (two) times daily.      . metFORMIN (GLUCOPHAGE-XR) 500 MG 24 hr tablet TAKE 4 TABLETS DAILY WITH SUPPER  120 tablet  3  . nitroGLYCERIN (NITROSTAT) 0.4 MG SL tablet Place 1 tablet (0.4 mg total) under the tongue every 5 (five) minutes x 3 doses as needed for  chest pain.  30 tablet  12  . ONGLYZA 5 MG TABS tablet TAKE 1 TABLET (5 MG TOTAL) BY MOUTH DAILY.  30 tablet  3  . tobramycin (TOBREX) 0.3 % ophthalmic solution Place 1 drop into the left eye every 4 (four) hours.  5 mL  0  . Vortioxetine HBr (BRINTELLIX) 10 MG TABS Take 1 tablet by mouth daily.       No current facility-administered medications on file prior to visit.    BP 132/82  Pulse 112  Wt 140 lb (63.504 kg)chart     Objective:   Physical Exam  Constitutional: She is oriented to person, place, and time. She appears well-developed and well-nourished.  Neck: Normal range of motion. Neck supple.  Cardiovascular: Normal rate, regular rhythm and normal heart sounds.   Pulmonary/Chest: Effort normal and breath sounds normal.  Abdominal: Soft. Bowel sounds are normal.  Musculoskeletal: Normal range of motion. She exhibits no edema.  Neurological: She is alert and oriented to person, place, and time. She has normal reflexes. She displays normal reflexes. No cranial nerve deficit. Coordination normal.  Skin: Skin is warm and dry.  Psychiatric: She has a normal mood and affect.          Assessment & Plan:  Assessment: 1. Acute sinusitis 2. Fibromyalgia 3. Type 2 diabetes 4. Anxiety 5. Depression  Plan: Treat with clindamycin 300 mg 3 times a day times one week for sinusitis. Continue seeing psychiatry for anxiety and depression. I will provide her a note for work. Her labs sent today. Another patient and her results. Call the office with any questions or concerns. Follow psychiatry and here for medical concerns

## 2013-08-13 NOTE — Patient Instructions (Signed)
Stress Stress-related medical problems are becoming increasingly common. The body has a built-in physical response to stressful situations. Faced with pressure, challenge or danger, we need to react quickly. Our bodies release hormones such as cortisol and adrenaline to help do this. These hormones are part of the "fight or flight" response and affect the metabolic rate, heart rate and blood pressure, resulting in a heightened, stressed state that prepares the body for optimum performance in dealing with a stressful situation. It is likely that early man required these mechanisms to stay alive, but usually modern stresses do not call for this, and the same hormones released in today's world can damage health and reduce coping ability. CAUSES  Pressure to perform at work, at school or in sports.  Threats of physical violence.  Money worries.  Arguments.  Family conflicts.  Divorce or separation from significant other.  Bereavement.  New job or unemployment.  Changes in location.  Alcohol or drug abuse. SOMETIMES, THERE IS NO PARTICULAR REASON FOR DEVELOPING STRESS. Almost all people are at risk of being stressed at some time in their lives. It is important to know that some stress is temporary and some is long term.  Temporary stress will go away when a situation is resolved. Most people can cope with short periods of stress, and it can often be relieved by relaxing, taking a walk, chatting through issues with friends, or having a good night's sleep.  Chronic (long-term, continuous) stress is much harder to deal with. It can be psychologically and emotionally damaging. It can be harmful both for an individual and for friends and family. SYMPTOMS Everyone reacts to stress differently. There are some common effects that help us recognize it. In times of extreme stress, people may:  Shake uncontrollably.  Breathe faster and deeper than normal (hyperventilate).  Vomit.  For people  with asthma, stress can trigger an attack.  For some people, stress may trigger migraine headaches, ulcers, and body pain. PHYSICAL EFFECTS OF STRESS MAY INCLUDE:  Loss of energy.  Skin problems.  Aches and pains resulting from tense muscles, including neck ache, backache and tension headaches.  Increased pain from arthritis and other conditions.  Irregular heart beat (palpitations).  Periods of irritability or anger.  Apathy or depression.  Anxiety (feeling uptight or worrying).  Unusual behavior.  Loss of appetite.  Comfort eating.  Lack of concentration.  Loss of, or decreased, sex-drive.  Increased smoking, drinking, or recreational drug use.  For women, missed periods.  Ulcers, joint pain, and muscle pain. Post-traumatic stress is the stress caused by any serious accident, strong emotional damage, or extremely difficult or violent experience such as rape or war. Post-traumatic stress victims can experience mixtures of emotions such as fear, shame, depression, guilt or anger. It may include recurrent memories or images that may be haunting. These feelings can last for weeks, months or even years after the traumatic event that triggered them. Specialized treatment, possibly with medicines and psychological therapies, is available. If stress is causing physical symptoms, severe distress or making it difficult for you to function as normal, it is worth seeing your caregiver. It is important to remember that although stress is a usual part of life, extreme or prolonged stress can lead to other illnesses that will need treatment. It is better to visit a doctor sooner rather than later. Stress has been linked to the development of high blood pressure and heart disease, as well as insomnia and depression. There is no diagnostic test for   stress since everyone reacts to it differently. But a caregiver will be able to spot the physical symptoms, such  as:  Headaches.  Shingles.  Ulcers. Emotional distress such as intense worry, low mood or irritability should be detected when the doctor asks pertinent questions to identify any underlying problems that might be the cause. In case there are physical reasons for the symptoms, the doctor may also want to do some tests to exclude certain conditions. If you feel that you are suffering from stress, try to identify the aspects of your life that are causing it. Sometimes you may not be able to change or avoid them, but even a small change can have a positive ripple effect. A simple lifestyle change can make all the difference. STRATEGIES THAT CAN HELP DEAL WITH STRESS:  Delegating or sharing responsibilities.  Avoiding confrontations.  Learning to be more assertive.  Regular exercise.  Avoid using alcohol or street drugs to cope.  Eating a healthy, balanced diet, rich in fruit and vegetables and proteins.  Finding humor or absurdity in stressful situations.  Never taking on more than you know you can handle comfortably.  Organizing your time better to get as much done as possible.  Talking to friends or family and sharing your thoughts and fears.  Listening to music or relaxation tapes.  Tensing and then relaxing your muscles, starting at the toes and working up to the head and neck. If you think that you would benefit from help, either in identifying the things that are causing your stress or in learning techniques to help you relax, see a caregiver who is capable of helping you with this. Rather than relying on medications, it is usually better to try and identify the things in your life that are causing stress and try to deal with them. There are many techniques of managing stress including counseling, psychotherapy, aromatherapy, yoga, and exercise. Your caregiver can help you determine what is best for you. Document Released: 12/04/2002 Document Revised: 12/06/2011 Document  Reviewed: 10/31/2007 Exit[employer] Patient Information 2014 [employer].   Smoking Cessation Quitting smoking is important to your health and has many advantages. However, it is not always easy to quit since nicotine is a very addictive drug. Often times, people try 3 times or more before being able to quit. This document explains the best ways for you to prepare to quit smoking. Quitting takes hard work and a lot of effort, but you can do it. ADVANTAGES OF QUITTING SMOKING  You will live longer, feel better, and live better.  Your body will feel the impact of quitting smoking almost immediately.  Within 20 minutes, blood pressure decreases. Your pulse returns to its normal level.  After 8 hours, carbon monoxide levels in the blood return to normal. Your oxygen level increases.  After 24 hours, the chance of having a heart attack starts to decrease. Your breath, hair, and body stop smelling like smoke.  After 48 hours, damaged nerve endings begin to recover. Your sense of taste and smell improve.  After 72 hours, the body is virtually free of nicotine. Your bronchial tubes relax and breathing becomes easier.  After 2 to 12 weeks, lungs can hold more air. Exercise becomes easier and circulation improves.  The risk of having a heart attack, stroke, cancer, or lung disease is greatly reduced.  After 1 year, the risk of coronary heart disease is cut in half.  After 5 years, the risk of stroke falls to the same as a   nonsmoker.  After 10 years, the risk of lung cancer is cut in half and the risk of other cancers decreases significantly.  After 15 years, the risk of coronary heart disease drops, usually to the level of a nonsmoker.  If you are pregnant, quitting smoking will improve your chances of having a healthy baby.  The people you live with, especially any children, will be healthier.  You will have extra money to spend on things other than cigarettes. QUESTIONS TO THINK ABOUT  BEFORE ATTEMPTING TO QUIT You may want to talk about your answers with your caregiver.  Why do you want to quit?  If you tried to quit in the past, what helped and what did not?  What will be the most difficult situations for you after you quit? How will you plan to handle them?  Who can help you through the tough times? Your family? Friends? A caregiver?  What pleasures do you get from smoking? What ways can you still get pleasure if you quit? Here are some questions to ask your caregiver:  How can you help me to be successful at quitting?  What medicine do you think would be best for me and how should I take it?  What should I do if I need more help?  What is smoking withdrawal like? How can I get information on withdrawal? GET READY  Set a quit date.  Change your environment by getting rid of all cigarettes, ashtrays, matches, and lighters in your home, car, or work. Do not let people smoke in your home.  Review your past attempts to quit. Think about what worked and what did not. GET SUPPORT AND ENCOURAGEMENT You have a better chance of being successful if you have help. You can get support in many ways.  Tell your family, friends, and co-workers that you are going to quit and need their support. Ask them not to smoke around you.  Get individual, group, or telephone counseling and support. Programs are available at local hospitals and health centers. Call your local health department for information about programs in your area.  Spiritual beliefs and practices may help some smokers quit.  Download a "quit meter" on your computer to keep track of quit statistics, such as how long you have gone without smoking, cigarettes not smoked, and money saved.  Get a self-help book about quitting smoking and staying off of tobacco. LEARN NEW SKILLS AND BEHAVIORS  Distract yourself from urges to smoke. Talk to someone, go for a walk, or occupy your time with a task.  Change your  normal routine. Take a different route to work. Drink tea instead of coffee. Eat breakfast in a different place.  Reduce your stress. Take a hot bath, exercise, or read a book.  Plan something enjoyable to do every day. Reward yourself for not smoking.  Explore interactive web-based programs that specialize in helping you quit. GET MEDICINE AND USE IT CORRECTLY Medicines can help you stop smoking and decrease the urge to smoke. Combining medicine with the above behavioral methods and support can greatly increase your chances of successfully quitting smoking.  Nicotine replacement therapy helps deliver nicotine to your body without the negative effects and risks of smoking. Nicotine replacement therapy includes nicotine gum, lozenges, inhalers, nasal sprays, and skin patches. Some may be available over-the-counter and others require a prescription.  Antidepressant medicine helps people abstain from smoking, but how this works is unknown. This medicine is available by prescription.  Nicotinic receptor partial   agonist medicine simulates the effect of nicotine in your brain. This medicine is available by prescription. Ask your caregiver for advice about which medicines to use and how to use them based on your health history. Your caregiver will tell you what side effects to look out for if you choose to be on a medicine or therapy. Carefully read the information on the package. Do not use any other product containing nicotine while using a nicotine replacement product.  RELAPSE OR DIFFICULT SITUATIONS Most relapses occur within the first 3 months after quitting. Do not be discouraged if you start smoking again. Remember, most people try several times before finally quitting. You may have symptoms of withdrawal because your body is used to nicotine. You may crave cigarettes, be irritable, feel very hungry, cough often, get headaches, or have difficulty concentrating. The withdrawal symptoms are only  temporary. They are strongest when you first quit, but they will go away within 10 14 days. To reduce the chances of relapse, try to:  Avoid drinking alcohol. Drinking lowers your chances of successfully quitting.  Reduce the amount of caffeine you consume. Once you quit smoking, the amount of caffeine in your body increases and can give you symptoms, such as a rapid heartbeat, sweating, and anxiety.  Avoid smokers because they can make you want to smoke.  Do not let weight gain distract you. Many smokers will gain weight when they quit, usually less than 10 pounds. Eat a healthy diet and stay active. You can always lose the weight gained after you quit.  Find ways to improve your mood other than smoking. FOR MORE INFORMATION  www.smokefree.gov  Document Released: 09/07/2001 Document Revised: 03/14/2012 Document Reviewed: 12/23/2011 [employer] Patient Information 2014 [employer].  

## 2013-08-13 NOTE — Progress Notes (Signed)
Pre visit review using our clinic review tool, if applicable. No additional management support is needed unless otherwise documented below in the visit note. 

## 2013-08-14 ENCOUNTER — Other Ambulatory Visit: Payer: Self-pay | Admitting: Family

## 2013-08-14 LAB — HEPATIC FUNCTION PANEL
ALT: 20 U/L (ref 0–35)
AST: 23 U/L (ref 0–37)
Alkaline Phosphatase: 82 U/L (ref 39–117)
Total Bilirubin: 0.7 mg/dL (ref 0.3–1.2)

## 2013-08-14 LAB — BASIC METABOLIC PANEL
Chloride: 107 mEq/L (ref 96–112)
GFR: 87.82 mL/min (ref >60.00)
Potassium: 4.8 mEq/L (ref 3.5–5.1)
Sodium: 141 mEq/L (ref 135–145)

## 2013-08-14 MED ORDER — GLIPIZIDE 5 MG PO TABS: 5.0000 mg | ORAL_TABLET | Freq: Two times a day (BID) | ORAL | Status: DC

## 2013-08-30 ENCOUNTER — Ambulatory Visit (INDEPENDENT_AMBULATORY_CARE_PROVIDER_SITE_OTHER): Payer: 59 | Admitting: Cardiology

## 2013-08-30 ENCOUNTER — Encounter: Payer: Self-pay | Admitting: Cardiology

## 2013-08-30 VITALS — BP 120/78 | HR 81 | Ht 64.0 in | Wt 139.0 lb

## 2013-08-30 DIAGNOSIS — I2119 ST elevation (STEMI) myocardial infarction involving other coronary artery of inferior wall: Secondary | ICD-10-CM

## 2013-08-30 MED ORDER — ISOSORBIDE MONONITRATE ER 60 MG PO TB24: 60.0000 mg | ORAL_TABLET | Freq: Every day | ORAL | Status: DC

## 2013-08-30 NOTE — Patient Instructions (Signed)
The current medical regimen is effective;  continue present plan and medications.  Follow up in 6 months with Dr Antoine Poche.  You will receive a letter in the mail 2 months before you are due.  Please call us when you receive this letter to schedule your follow up appointment.   Dr Barnett Abu

## 2013-08-30 NOTE — Progress Notes (Signed)
HPI The patient presents for followup after recent hospitalization with a non-Q-wave myocardial infarction..  Catheterization demonstrated 20% proximal stenosis and 30% mid stenosis in the LAD. The circumflex had 20% proximal stenosis. The right coronary artery and 20% stenosis with distal vessel 99% stenosis. The patient underwent PTCA and stenting of the distal RCA.  She was having significant leg pain. I sent her to see Dr. Kirke Corin.  He did lower extremity angiography which demonstrated, initially, severe disease in right external iliac artery. However, this improved significantly with NTG with a systolic gradient of only 10-15 mm hg. She was started on Imdur.  At the last appointment I tried to increase this to 120 mg daily but she didn't tolerated having lightheadedness and near syncope. Since then she has cut down her cigarette smoking. Her leg pain is still persistent.  She is on a leave from work currently which is causing some stress.  She is having no new chest pain or new SOB.  and   Allergies  Allergen Reactions  . Statins     SEVERE MUSCLE PAIN  . Ace Inhibitors Cough    Patient refuses to take meds    Current Outpatient Prescriptions  Medication Sig Dispense Refill  . aspirin 81 MG tablet Take 1 tablet (81 mg total) by mouth 2 (two) times daily.      . busPIRone (BUSPAR) 15 MG tablet Take 15 mg by mouth 2 (two) times daily.      . clopidogrel (PLAVIX) 75 MG tablet Take 1 tablet (75 mg total) by mouth daily.  90 tablet  1  . cyclobenzaprine (FLEXERIL) 10 MG tablet Take 1 tablet (10 mg total) by mouth at bedtime as needed.  90 tablet  1  . isosorbide mononitrate (IMDUR) 120 MG 24 hr tablet Take 1 tablet (120 mg total) by mouth daily.  90 tablet  1  . metFORMIN (GLUCOPHAGE-XR) 500 MG 24 hr tablet TAKE 4 TABLETS DAILY WITH SUPPER  120 tablet  3  . nitroGLYCERIN (NITROSTAT) 0.4 MG SL tablet Place 1 tablet (0.4 mg total) under the tongue every 5 (five) minutes x 3 doses as needed for  chest pain.  30 tablet  12  . ONGLYZA 5 MG TABS tablet TAKE 1 TABLET (5 MG TOTAL) BY MOUTH DAILY.  30 tablet  3  . Vortioxetine HBr (BRINTELLIX) 10 MG TABS Take 1 tablet by mouth daily.       No current facility-administered medications for this visit.    Past Medical History  Diagnosis Date  . Diabetes mellitus   . Fibromyalgia   . Coronary artery disease   . Anxiety   . Depression   . Asthma   . Acute MI, inferior wall, initial episode of care 07/08/2012    Cath-07/08/11 -distal RCA stent DES (99%), LAD 20-30%. EF 50% inferior hypokinesis  (infarct)  . Tobacco use disorder 07/08/2012  . Leukocytosis   . Monocytosis   . Peripheral arterial disease 01/23/2013    Past Surgical History  Procedure Laterality Date  . Cardiac catheterization      ROS:  As stated in the HPI and negative for all other systems.  PHYSICAL EXAM BP 120/78  Pulse 81  Ht 5\' 4"  (1.626 m)  Wt 139 lb (63.05 kg)  BMI 23.85 kg/m2 GENERAL:  Well appearing NECK:  No jugular venous distention, waveform within normal limits, carotid upstroke brisk and symmetric, soft right carotid bruit, no thyromegaly LUNGS:  Clear to auscultation bilaterally CHEST:  Unremarkable HEART:  PMI not displaced or sustained,S1 and S2 within normal limits, no S3, no S4, no clicks, no rubs, no murmurs ABD:  Flat, positive bowel sounds normal in frequency in pitch, no bruits, no rebound, no guarding, no midline pulsatile mass, no hepatomegaly, no splenomegaly EXT:  2 plus pulses upper, 1+ dorsalis pedis bilateral, absent posterior tibialis bilateral, no edema, no cyanosis no clubbing  ASSESSMENT AND PLAN  CAD  The patient has no new sypmtoms.  No further cardiovascular testing is indicated.  We will continue with aggressive risk reduction and meds as listed.    Leg pain She will continue current medications and we discussed when necessary dosing of sublingual nitroglycerin.  Tobacco abuse We discussed this again today  She is  down to a quarter pack.    Dyslipidemia She has been intolerant of all statins.  Carotid stenosis She has some mild left greater than right carotid stenosis. We will follow this up again next April.  Arm pain This could represent spasm. However, it also could be related to a neurologic problem.  She was again encouraged to follow up with a neurosurgeon.    Sleep apnea She was diagnosed with this and is using CPAP.

## 2013-09-03 ENCOUNTER — Encounter: Payer: Self-pay | Admitting: Cardiology

## 2013-09-05 ENCOUNTER — Encounter: Payer: Self-pay | Admitting: Cardiology

## 2013-09-14 ENCOUNTER — Other Ambulatory Visit: Payer: Self-pay | Admitting: Cardiology

## 2013-09-14 MED ORDER — ISOSORBIDE MONONITRATE ER 60 MG PO TB24: 60.0000 mg | ORAL_TABLET | Freq: Every day | ORAL | Status: DC

## 2013-10-01 ENCOUNTER — Ambulatory Visit (INDEPENDENT_AMBULATORY_CARE_PROVIDER_SITE_OTHER): Payer: 59 | Admitting: Family

## 2013-10-01 ENCOUNTER — Encounter: Payer: Self-pay | Admitting: Family

## 2013-10-01 VITALS — BP 108/62 | HR 77 | Temp 98.3°F | Wt 147.0 lb

## 2013-10-01 DIAGNOSIS — F329 Major depressive disorder, single episode, unspecified: Secondary | ICD-10-CM

## 2013-10-01 DIAGNOSIS — J309 Allergic rhinitis, unspecified: Secondary | ICD-10-CM

## 2013-10-01 DIAGNOSIS — E119 Type 2 diabetes mellitus without complications: Secondary | ICD-10-CM

## 2013-10-01 DIAGNOSIS — F3289 Other specified depressive episodes: Secondary | ICD-10-CM

## 2013-10-01 MED ORDER — CYCLOBENZAPRINE HCL 10 MG PO TABS: 10.0000 mg | ORAL_TABLET | Freq: Every evening | ORAL | Status: DC | PRN

## 2013-10-01 MED ORDER — FLUTICASONE PROPIONATE 50 MCG/ACT NA SUSP: 2.0000 | Freq: Every day | NASAL | Status: DC

## 2013-10-01 MED ORDER — QUETIAPINE FUMARATE 25 MG PO TABS: 25.0000 mg | ORAL_TABLET | Freq: Every day | ORAL | Status: DC

## 2013-10-01 NOTE — Progress Notes (Signed)
Subjective:    Patient ID: [name], female    DOB: 04/09/1956, 58 y.o.   MRN: [id number]  HPI 58 year old, white female, smoker, is in today for a recheck of depression. She is currently out of work related to depression and anxiety.  She still has not seen psychiatry.  She also reports sinus congestion, cough and sinus drainage x 3 days.    Review of Systems  Constitutional: Negative.   HENT: Positive for congestion, sneezing and sore throat.   Respiratory: Negative.   Cardiovascular: Negative.   Gastrointestinal: Negative.   Endocrine: Negative.   Genitourinary: Negative.   Musculoskeletal: Negative.   Skin: Negative.   Neurological: Negative.   Hematological: Negative.   Psychiatric/Behavioral: Positive for confusion. The patient is nervous/anxious.    Past Medical History  Diagnosis Date  . Diabetes mellitus   . Fibromyalgia   . Coronary artery disease   . Anxiety   . Depression   . Asthma   . Acute MI, inferior wall, initial episode of care 07/08/2012    Cath-07/08/11 -distal RCA stent DES (99%), LAD 20-30%. EF 50% inferior hypokinesis  (infarct)  . Tobacco use disorder 07/08/2012  . Leukocytosis   . Monocytosis   . Peripheral arterial disease 01/23/2013    History   Social History  . Marital Status: Married    Spouse Name: N/A    Number of Children: 3  . Years of Education: N/A   Occupational History  .  Unemployed   Social History Main Topics  . Smoking status: Current Every Day Smoker -- 1.50 packs/day  . Smokeless tobacco: Not on file  . Alcohol Use: Yes     Comment: Occasionally  . Drug Use: No  . Sexual Activity: Yes   Other Topics Concern  . Not on file   Social History Narrative   Lives with husband.      Past Surgical History  Procedure Laterality Date  . Cardiac catheterization      Family History  Problem Relation Age of Onset  . Kidney disease Mother   . Diabetes Mother     DM Type II  . Diabetes Father     IDDM  .  Heart disease Father   . Cancer Father     bone cancer died at 3852  . Cancer Sister     bone cancer at age 56  . Multiple sclerosis Daughter   . Diabetes Son 2    DM Type I  . Heart disease Maternal Grandfather   . Heart disease Paternal Grandmother   . Diabetes Son [id number]    DM Type I    Allergies  Allergen Reactions  . Statins     SEVERE MUSCLE PAIN  . Ace Inhibitors Cough    Patient refuses to take meds    Current Outpatient Prescriptions on File Prior to Visit  Medication Sig Dispense Refill  . aspirin 81 MG tablet Take 1 tablet (81 mg total) by mouth 2 (two) times daily.      . busPIRone (BUSPAR) 15 MG tablet Take 15 mg by mouth 2 (two) times daily.      . clopidogrel (PLAVIX) 75 MG tablet Take 1 tablet (75 mg total) by mouth daily.  90 tablet  1  . isosorbide mononitrate (IMDUR) 60 MG 24 hr tablet Take 1 tablet (60 mg total) by mouth daily.  30 tablet  11  . metFORMIN (GLUCOPHAGE-XR) 500 MG 24 hr tablet TAKE 4 TABLETS DAILY WITH SUPPER  120 tablet  3  . nitroGLYCERIN (NITROSTAT) 0.4 MG SL tablet Place 1 tablet (0.4 mg total) under the tongue every 5 (five) minutes x 3 doses as needed for chest pain.  30 tablet  12  . ONGLYZA 5 MG TABS tablet TAKE 1 TABLET (5 MG TOTAL) BY MOUTH DAILY.  30 tablet  3   No current facility-administered medications on file prior to visit.    BP 108/62  Pulse 77  Temp(Src) 98.3 F (36.8 C) (Oral)  Wt 147 lb (66.679 kg)  SpO2 98%chart     Objective:   Physical Exam  Constitutional: She is oriented to person, place, and time. She appears well-developed and well-nourished.  Neck: Normal range of motion. Neck supple.  Cardiovascular: Normal rate, regular rhythm and normal heart sounds.   Pulmonary/Chest: Effort normal and breath sounds normal.  Abdominal: Soft. Bowel sounds are normal.  Musculoskeletal: Normal range of motion.  Neurological: She is alert and oriented to person, place, and time.  Skin: Skin is warm and dry.  Psychiatric:  She has a normal mood and affect.          Assessment & Plan:  Assessment:  1. Allergic Rhinitis 2. Type 2 Diabetes 3. Depression   Plan: Start Lamictal 25 mg twice a day. Flonase 2 sprays in each nostril once a day. See psychiatry asap. If not in the next 3 weeks, recheck here.

## 2013-10-01 NOTE — Progress Notes (Signed)
   Subjective:    Patient ID: [name], female    DOB: 03/19/1956, 58 y.o.   MRN: [id number]  HPI    Review of Systems     Objective:   Physical Exam        Assessment & Plan:  Plan: Seroquel 25mg  once daily. Not Lamical

## 2013-10-01 NOTE — Patient Instructions (Signed)
Mood Disorders  Mood disorders are conditions that affect the way a person feels emotionally. The main mood disorders include:  · Depression.  · Bipolar disorder.  · Dysthymia. Dysthymia is a mild, lasting (chronic) depression. Symptoms of dysthymia are similar to depression, but not as severe.  · Cyclothymia. Cyclothymia includes mood swings, but the highs and lows are not as severe as they are in bipolar disorder. Symptoms of cyclothymia are similar to those of bipolar disorder, but less extreme.  CAUSES   Mood disorders are probably caused by a combination of factors. People with mood disorders seem to have physical and chemical changes in their brains. Mood disorders run in families, so there may be genetic causes. Severe trauma or stressful life events may also increase the risk of mood disorders.   SYMPTOMS   Symptoms of mood disorders depend on the specific type of condition.  Depression symptoms include:  · Feeling sad, worthless, or hopeless.  · Negative thoughts.  · Inability to enjoy one's usual activities.  · Low energy.  · Sleeping too much or too little.  · Appetite changes.  · Crying.  · Concentration problems.  · Thoughts of harming oneself.  Bipolar disorder symptoms include:  · Periods of depression (see above symptoms).  · Mood swings, from sadness and depression, to abnormal elation and excitement.  · Periods of mania:  · Racing thoughts.  · Fast speech.  · Poor judgment, and careless, dangerous choices.  · Decreased need for sleep.  · Risky behavior.  · Difficulty concentrating.  · Irritability.  · Increased energy.  · Increased sex drive.  DIAGNOSIS   There are no blood tests or X-rays that can confirm a mood disorder. However, your caregiver may choose to run some tests to make sure that there is not another physical cause for your symptoms. A mood disorder is usually diagnosed after an in-depth interview with a caregiver.  TREATMENT   Mood disorders can be treated with one or more of the  following:  · Medicine. This may include antidepressants, mood-stabilizers, or anti-psychotics.  · Psychotherapy (talk therapy).  · Cognitive behavioral therapy. You are taught to recognize negative thoughts and behavior patterns, and replace them with healthy thoughts and behaviors.  · Electroconvulsive therapy. For very severe cases of deep depression, a series of treatments in which an electrical current is applied to the brain.  · Vagus nerve stimulation. A pulse of electricity is applied to a portion of the brain.  · Transcranial magnetic stimulation. Powerful magnets are placed on the head that produce electrical currents.  · Hospitalization. In severe situations, or when someone is having serious thoughts of harming him or herself, hospitalization may be necessary in order to keep the person safe. This is also done to quickly start and monitor treatment.  HOME CARE INSTRUCTIONS   · Take your medicine exactly as directed.  · Attend all of your therapy sessions.  · Try to eat regular, healthy meals.  · Exercise daily. Exercise may improve mood symptoms.  · Get good sleep.  · Do not drink alcohol or use pot or other drugs. These can worsen mood symptoms and cause anxiety and psychosis.  · Tell your caregiver if you develop any side effects, such as feeling sick to your stomach (nauseous), dry mouth, dizziness, constipation, drowsiness, tremor, weight gain, or sexual symptoms. He or she may suggest things you can do to improve symptoms.  · Learn ways to cope with the stress of having a   chronic illness. This includes yoga, meditation, tai chi, or participating in a support group.  · Drink enough water to keep your urine clear or pale yellow. Eat a high-fiber diet. These habits may help you avoid constipation from your medicine.  SEEK IMMEDIATE MEDICAL CARE IF:  · Your mood worsens.  · You have thoughts of hurting yourself or others.  · You cannot care for yourself.  · You develop the sensation of hearing or seeing  something that is not actually present (auditory or visual hallucinations).  · You develop abnormal thoughts.  Document Released: 07/11/2009 Document Revised: 12/06/2011 Document Reviewed: 07/11/2009  Exit[employer]® Patient Information ©2014 [employer].

## 2013-10-02 ENCOUNTER — Ambulatory Visit (INDEPENDENT_AMBULATORY_CARE_PROVIDER_SITE_OTHER): Payer: 59 | Admitting: Cardiovascular Disease

## 2013-10-02 ENCOUNTER — Encounter: Payer: Self-pay | Admitting: Cardiovascular Disease

## 2013-10-02 VITALS — BP 102/67 | HR 80 | Ht 64.0 in | Wt 144.6 lb

## 2013-10-02 DIAGNOSIS — F17200 Nicotine dependence, unspecified, uncomplicated: Secondary | ICD-10-CM

## 2013-10-02 DIAGNOSIS — I739 Peripheral vascular disease, unspecified: Secondary | ICD-10-CM

## 2013-10-02 NOTE — Patient Instructions (Addendum)
Your physician has requested that you have a lower extremity arterial duplex. This test is an ultrasound of the arteries in the legs or arms. It looks at arterial blood flow in the legs . Allow one hour for Lower and Upper Arterial scans. There are no restrictions or special instructions  Aortic iliac dopplers  We will call you with the test results

## 2013-10-02 NOTE — Assessment & Plan Note (Signed)
I again discussed with her the importance of smoking cessation. 

## 2013-10-02 NOTE — Progress Notes (Signed)
Primary cardiologist: Dr. Antoine PocheHochrein  HPI  This is a 58 year old female who is here today for a follow up visit regarding PAD.  She has known history of coronary artery disease with  a non-Q-wave myocardial infarction in October of last year. Catheterization demonstrated 20% proximal stenosis and 30% mid stenosis in the LAD. The circumflex had 20% proximal stenosis. The right coronary artery and 20% stenosis with distal vessel 99% stenosis. The patient underwent PTCA and stenting of the distal RCA. She has prolonged history of tobacco use, type 2 diabetes and hyperlipidemia. She had a history of peripheral arterial disease with right leg claudication. I performed angiography in 05/14 which  showed severe disease in right external iliac artery. However, this improved significantly with NTG with a systolic gradient of only 10-15 mm hg. she was treated with Imdur with subsequent improvement in symptoms. However, over the last few months she has experienced worsening right leg pain that starts from the thigh area with minimal walking. She continues to complain of generalized muscle spasm and aches. She continues to smoke a few cigarettes a day. I  Allergies  Allergen Reactions  . Statins     SEVERE MUSCLE PAIN  . Ace Inhibitors Cough    Patient refuses to take meds     Current Outpatient Prescriptions on File Prior to Visit  Medication Sig Dispense Refill  . aspirin 81 MG tablet Take 1 tablet (81 mg total) by mouth 2 (two) times daily.      . busPIRone (BUSPAR) 15 MG tablet Take 15 mg by mouth 2 (two) times daily.      . clopidogrel (PLAVIX) 75 MG tablet Take 1 tablet (75 mg total) by mouth daily.  90 tablet  1  . cyclobenzaprine (FLEXERIL) 10 MG tablet Take 1 tablet (10 mg total) by mouth at bedtime as needed.  90 tablet  1  . fluticasone (FLONASE) 50 MCG/ACT nasal spray Place 2 sprays into both nostrils daily.  16 g  6  . isosorbide mononitrate (IMDUR) 60 MG 24 hr tablet Take 1 tablet (60 mg  total) by mouth daily.  30 tablet  11  . metFORMIN (GLUCOPHAGE-XR) 500 MG 24 hr tablet TAKE 4 TABLETS DAILY WITH SUPPER  120 tablet  3  . nitroGLYCERIN (NITROSTAT) 0.4 MG SL tablet Place 1 tablet (0.4 mg total) under the tongue every 5 (five) minutes x 3 doses as needed for chest pain.  30 tablet  12  . ONGLYZA 5 MG TABS tablet TAKE 1 TABLET (5 MG TOTAL) BY MOUTH DAILY.  30 tablet  3  . QUEtiapine (SEROQUEL) 25 MG tablet Take 1 tablet (25 mg total) by mouth at bedtime.  30 tablet  2   No current facility-administered medications on file prior to visit.     Past Medical History  Diagnosis Date  . Diabetes mellitus   . Fibromyalgia   . Coronary artery disease   . Anxiety   . Depression   . Asthma   . Acute MI, inferior wall, initial episode of care 07/08/2012    Cath-07/08/11 -distal RCA stent DES (99%), LAD 20-30%. EF 50% inferior hypokinesis  (infarct)  . Tobacco use disorder 07/08/2012  . Leukocytosis   . Monocytosis   . Peripheral arterial disease 01/23/2013     Past Surgical History  Procedure Laterality Date  . Cardiac catheterization       Family History  Problem Relation Age of Onset  . Kidney disease Mother   . Diabetes Mother  DM Type II  . Diabetes Father     IDDM  . Heart disease Father   . Cancer Father     bone cancer died at 52  . Cancer Sister     bone cancer at age 19  . Multiple sclerosis Daughter   . Diabetes Son 2    DM Type I  . Heart disease Maternal Grandfather   . Heart disease Paternal Grandmother   . Diabetes Son 76    DM Type I     History   Social History  . Marital Status: Married    Spouse Name: N/A    Number of Children: 3  . Years of Education: N/A   Occupational History  .  Unemployed   Social History Main Topics  . Smoking status: Current Every Day Smoker -- 1.50 packs/day  . Smokeless tobacco: Not on file  . Alcohol Use: Yes     Comment: Occasionally  . Drug Use: No  . Sexual Activity: Yes   Other Topics  Concern  . Not on file   Social History Narrative   Lives with husband.       ROS A10 point review of system was performed and is negative other than what is mentioned in the history of present illness  PHYSICAL EXAM   BP 102/67  Pulse 80  Ht 5\' 4"  (1.626 m)  Wt 144 lb 9.6 oz (65.59 kg)  BMI 24.81 kg/m2 Constitutional: She is oriented to person, place, and time. She appears well-developed and well-nourished. No distress.  HENT: No nasal discharge.  Head: Normocephalic and atraumatic.  Eyes: Pupils are equal and round. Right eye exhibits no discharge. Left eye exhibits no discharge.  Neck: Normal range of motion. Neck supple. No JVD present. No thyromegaly present.  Cardiovascular: Normal rate, regular rhythm, normal heart sounds. Exam reveals no gallop and no friction rub. No murmur heard.  Pulmonary/Chest: Effort normal and breath sounds normal. No stridor. No respiratory distress. She has no wheezes. She has no rales. She exhibits no tenderness.  Abdominal: Soft. Bowel sounds are normal. She exhibits no distension. There is no tenderness. There is no rebound and no guarding.  Musculoskeletal: Normal range of motion. She exhibits no edema and no tenderness.  Neurological: She is alert and oriented to person, place, and time. Coordination normal.  Skin: Skin is warm and dry. No rash noted. She is not diaphoretic. No erythema. No pallor.  Psychiatric: She has a normal mood and affect. Her behavior is normal. Judgment and thought content normal.  Vascular: Radial pulses are normal. Femoral pulse: Very faint on the right side and normal on the left side. DP/PT faint bilaterally.    ASSESSMENT AND PLAN

## 2013-10-02 NOTE — Assessment & Plan Note (Signed)
The patient has worsening right leg claudication which is significantly limiting at this point. Also by physical exam, the right femoral pulse is very diminished and barely palpable.  I suspect progression of right external iliac disease.  I will request an aortoiliac duplex and ABI. If endovascular intervention is needed, I will plan on proceeding via the left femoral artery. Continue medical therapy for now.

## 2013-10-03 ENCOUNTER — Ambulatory Visit (HOSPITAL_COMMUNITY): Payer: 59 | Attending: Cardiovascular Disease

## 2013-10-03 ENCOUNTER — Encounter: Payer: Self-pay | Admitting: Cardiovascular Disease

## 2013-10-03 DIAGNOSIS — F17200 Nicotine dependence, unspecified, uncomplicated: Secondary | ICD-10-CM | POA: Insufficient documentation

## 2013-10-03 DIAGNOSIS — E1159 Type 2 diabetes mellitus with other circulatory complications: Secondary | ICD-10-CM

## 2013-10-03 DIAGNOSIS — I739 Peripheral vascular disease, unspecified: Secondary | ICD-10-CM

## 2013-10-03 DIAGNOSIS — I2510 Atherosclerotic heart disease of native coronary artery without angina pectoris: Secondary | ICD-10-CM | POA: Insufficient documentation

## 2013-10-03 DIAGNOSIS — E785 Hyperlipidemia, unspecified: Secondary | ICD-10-CM | POA: Insufficient documentation

## 2013-10-03 DIAGNOSIS — E119 Type 2 diabetes mellitus without complications: Secondary | ICD-10-CM | POA: Insufficient documentation

## 2013-10-03 DIAGNOSIS — I70219 Atherosclerosis of native arteries of extremities with intermittent claudication, unspecified extremity: Secondary | ICD-10-CM | POA: Insufficient documentation

## 2013-10-05 ENCOUNTER — Ambulatory Visit (HOSPITAL_COMMUNITY): Payer: 59 | Attending: Cardiovascular Disease

## 2013-10-05 ENCOUNTER — Encounter: Payer: Self-pay | Admitting: Internal Medicine

## 2013-10-05 DIAGNOSIS — F17200 Nicotine dependence, unspecified, uncomplicated: Secondary | ICD-10-CM | POA: Insufficient documentation

## 2013-10-05 DIAGNOSIS — I700 Atherosclerosis of aorta: Secondary | ICD-10-CM | POA: Insufficient documentation

## 2013-10-05 DIAGNOSIS — E119 Type 2 diabetes mellitus without complications: Secondary | ICD-10-CM | POA: Insufficient documentation

## 2013-10-05 DIAGNOSIS — I2510 Atherosclerotic heart disease of native coronary artery without angina pectoris: Secondary | ICD-10-CM | POA: Insufficient documentation

## 2013-10-05 DIAGNOSIS — R0989 Other specified symptoms and signs involving the circulatory and respiratory systems: Secondary | ICD-10-CM

## 2013-10-05 DIAGNOSIS — E785 Hyperlipidemia, unspecified: Secondary | ICD-10-CM | POA: Insufficient documentation

## 2013-10-05 DIAGNOSIS — I739 Peripheral vascular disease, unspecified: Secondary | ICD-10-CM | POA: Insufficient documentation

## 2013-10-14 ENCOUNTER — Other Ambulatory Visit: Payer: Self-pay | Admitting: Internal Medicine

## 2013-10-19 ENCOUNTER — Ambulatory Visit (INDEPENDENT_AMBULATORY_CARE_PROVIDER_SITE_OTHER): Payer: 59 | Admitting: Family

## 2013-10-19 ENCOUNTER — Encounter: Payer: Self-pay | Admitting: Family

## 2013-10-19 VITALS — BP 142/80 | HR 103 | Temp 98.8°F | Ht 64.0 in | Wt 143.0 lb

## 2013-10-19 DIAGNOSIS — Z720 Tobacco use: Secondary | ICD-10-CM

## 2013-10-19 DIAGNOSIS — IMO0001 Reserved for inherently not codable concepts without codable children: Secondary | ICD-10-CM

## 2013-10-19 DIAGNOSIS — J019 Acute sinusitis, unspecified: Secondary | ICD-10-CM

## 2013-10-19 DIAGNOSIS — F17200 Nicotine dependence, unspecified, uncomplicated: Secondary | ICD-10-CM

## 2013-10-19 DIAGNOSIS — M797 Fibromyalgia: Secondary | ICD-10-CM

## 2013-10-19 MED ORDER — MOXIFLOXACIN HCL 400 MG PO TABS: 400.0000 mg | ORAL_TABLET | Freq: Every day | ORAL | Status: DC

## 2013-10-19 NOTE — Patient Instructions (Signed)
Smoking Cessation Quitting smoking is important to your health and has many advantages. However, it is not always easy to quit since nicotine is a very addictive drug. Often times, people try 3 times or more before being able to quit. This document explains the best ways for you to prepare to quit smoking. Quitting takes hard work and a lot of effort, but you can do it. ADVANTAGES OF QUITTING SMOKING  You will live longer, feel better, and live better.  Your body will feel the impact of quitting smoking almost immediately.  Within 20 minutes, blood pressure decreases. Your pulse returns to its normal level.  After 8 hours, carbon monoxide levels in the blood return to normal. Your oxygen level increases.  After 24 hours, the chance of having a heart attack starts to decrease. Your breath, hair, and body stop smelling like smoke.  After 48 hours, damaged nerve endings begin to recover. Your sense of taste and smell improve.  After 72 hours, the body is virtually free of nicotine. Your bronchial tubes relax and breathing becomes easier.  After 2 to 12 weeks, lungs can hold more air. Exercise becomes easier and circulation improves.  The risk of having a heart attack, stroke, cancer, or lung disease is greatly reduced.  After 1 year, the risk of coronary heart disease is cut in half.  After 5 years, the risk of stroke falls to the same as a nonsmoker.  After 10 years, the risk of lung cancer is cut in half and the risk of other cancers decreases significantly.  After 15 years, the risk of coronary heart disease drops, usually to the level of a nonsmoker.  If you are pregnant, quitting smoking will improve your chances of having a healthy baby.  The people you live with, especially any children, will be healthier.  You will have extra money to spend on things other than cigarettes. QUESTIONS TO THINK ABOUT BEFORE ATTEMPTING TO QUIT You may want to talk about your answers with your  caregiver.  Why do you want to quit?  If you tried to quit in the past, what helped and what did not?  What will be the most difficult situations for you after you quit? How will you plan to handle them?  Who can help you through the tough times? Your family? Friends? A caregiver?  What pleasures do you get from smoking? What ways can you still get pleasure if you quit? Here are some questions to ask your caregiver:  How can you help me to be successful at quitting?  What medicine do you think would be best for me and how should I take it?  What should I do if I need more help?  What is smoking withdrawal like? How can I get information on withdrawal? GET READY  Set a quit date.  Change your environment by getting rid of all cigarettes, ashtrays, matches, and lighters in your home, car, or work. Do not let people smoke in your home.  Review your past attempts to quit. Think about what worked and what did not. GET SUPPORT AND ENCOURAGEMENT You have a better chance of being successful if you have help. You can get support in many ways.  Tell your family, friends, and co-workers that you are going to quit and need their support. Ask them not to smoke around you.  Get individual, group, or telephone counseling and support. Programs are available at local hospitals and health centers. Call your local health department for   information about programs in your area.  Spiritual beliefs and practices may help some smokers quit.  Download a "quit meter" on your computer to keep track of quit statistics, such as how long you have gone without smoking, cigarettes not smoked, and money saved.  Get a self-help book about quitting smoking and staying off of tobacco. LEARN NEW SKILLS AND BEHAVIORS  Distract yourself from urges to smoke. Talk to someone, go for a walk, or occupy your time with a task.  Change your normal routine. Take a different route to work. Drink tea instead of coffee.  Eat breakfast in a different place.  Reduce your stress. Take a hot bath, exercise, or read a book.  Plan something enjoyable to do every day. Reward yourself for not smoking.  Explore interactive web-based programs that specialize in helping you quit. GET MEDICINE AND USE IT CORRECTLY Medicines can help you stop smoking and decrease the urge to smoke. Combining medicine with the above behavioral methods and support can greatly increase your chances of successfully quitting smoking.  Nicotine replacement therapy helps deliver nicotine to your body without the negative effects and risks of smoking. Nicotine replacement therapy includes nicotine gum, lozenges, inhalers, nasal sprays, and skin patches. Some may be available over-the-counter and others require a prescription.  Antidepressant medicine helps people abstain from smoking, but how this works is unknown. This medicine is available by prescription.  Nicotinic receptor partial agonist medicine simulates the effect of nicotine in your brain. This medicine is available by prescription. Ask your caregiver for advice about which medicines to use and how to use them based on your health history. Your caregiver will tell you what side effects to look out for if you choose to be on a medicine or therapy. Carefully read the information on the package. Do not use any other product containing nicotine while using a nicotine replacement product.  RELAPSE OR DIFFICULT SITUATIONS Most relapses occur within the first 3 months after quitting. Do not be discouraged if you start smoking again. Remember, most people try several times before finally quitting. You may have symptoms of withdrawal because your body is used to nicotine. You may crave cigarettes, be irritable, feel very hungry, cough often, get headaches, or have difficulty concentrating. The withdrawal symptoms are only temporary. They are strongest when you first quit, but they will go away within  10 14 days. To reduce the chances of relapse, try to:  Avoid drinking alcohol. Drinking lowers your chances of successfully quitting.  Reduce the amount of caffeine you consume. Once you quit smoking, the amount of caffeine in your body increases and can give you symptoms, such as a rapid heartbeat, sweating, and anxiety.  Avoid smokers because they can make you want to smoke.  Do not let weight gain distract you. Many smokers will gain weight when they quit, usually less than 10 pounds. Eat a healthy diet and stay active. You can always lose the weight gained after you quit.  Find ways to improve your mood other than smoking. FOR MORE INFORMATION  www.smokefree.gov  Document Released: 09/07/2001 Document Revised: 03/14/2012 Document Reviewed: 12/23/2011 [employer] Patient Information 2014 [employer].  

## 2013-10-19 NOTE — Progress Notes (Signed)
Subjective:    Patient ID: [name], female    DOB: Mar 16, 1956, 58 y.o   MRN: [id number]  HPI 58 year old white female, 1 pack per day smoker who is in today with complaints of sneezing, cough, congestion, sinus pressure, purulent nasal discharge ongoing x3 weeks. She's been using Flonase that has been ineffective. She denies any fevers, aches or pains. Has a long-standing history of sinusitis.   Review of Systems  Constitutional: Negative.   HENT: Positive for congestion, postnasal drip, rhinorrhea and sinus pressure.   Respiratory: Positive for cough.   Cardiovascular: Negative.   Genitourinary: Negative.   Musculoskeletal: Negative.   Skin: Negative.   Allergic/Immunologic: Negative.   Psychiatric/Behavioral: Negative.    Past Medical History  Diagnosis Date  . Diabetes mellitus   . Fibromyalgia   . Coronary artery disease   . Anxiety   . Depression   . Asthma   . Acute MI, inferior wall, initial episode of care 07/08/2012    Cath-07/08/11 -distal RCA stent DES (99%), LAD 20-30%. EF 50% inferior hypokinesis  (infarct)  . Tobacco use disorder 07/08/2012  . Leukocytosis   . Monocytosis   . Peripheral arterial disease 01/23/2013    History   Social History  . Marital Status: Married    Spouse Name: N/A    Number of Children: 3  . Years of Education: N/A   Occupational History  .  Unemployed   Social History Main Topics  . Smoking status: Current Every Day Smoker -- 1.50 packs/day  . Smokeless tobacco: Not on file  . Alcohol Use: Yes     Comment: Occasionally  . Drug Use: No  . Sexual Activity: Yes   Other Topics Concern  . Not on file   Social History Narrative   Lives with husband.      Past Surgical History  Procedure Laterality Date  . Cardiac catheterization      Family History  Problem Relation Age of Onset  . Kidney disease Mother   . Diabetes Mother     DM Type II  . Diabetes Father     IDDM  . Heart disease Father   . Cancer  Father     bone cancer died at 73  . Cancer Sister     bone cancer at age 52  . Multiple sclerosis Daughter   . Diabetes Son 2    DM Type I  . Heart disease Maternal Grandfather   . Heart disease Paternal Grandmother   . Diabetes Son 7    DM Type I    Allergies  Allergen Reactions  . Statins     SEVERE MUSCLE PAIN  . Ace Inhibitors Cough    Patient refuses to take meds    Current Outpatient Prescriptions on File Prior to Visit  Medication Sig Dispense Refill  . aspirin 81 MG tablet Take 1 tablet (81 mg total) by mouth 2 (two) times daily.      . busPIRone (BUSPAR) 15 MG tablet Take 15 mg by mouth 2 (two) times daily.      . clopidogrel (PLAVIX) 75 MG tablet Take 1 tablet (75 mg total) by mouth daily.  90 tablet  1  . cyclobenzaprine (FLEXERIL) 10 MG tablet Take 1 tablet (10 mg total) by mouth at bedtime as needed.  90 tablet  1  . fluticasone (FLONASE) 50 MCG/ACT nasal spray Place 2 sprays into both nostrils daily.  16 g  6  . isosorbide mononitrate (IMDUR) 60 MG 24  hr tablet Take 1 tablet (60 mg total) by mouth daily.  30 tablet  11  . metFORMIN (GLUCOPHAGE-XR) 500 MG 24 hr tablet TAKE 4 TABLETS DAILY WITH SUPPER  120 tablet  3  . nitroGLYCERIN (NITROSTAT) 0.4 MG SL tablet Place 1 tablet (0.4 mg total) under the tongue every 5 (five) minutes x 3 doses as needed for chest pain.  30 tablet  12  . ONGLYZA 5 MG TABS tablet TAKE 1 TABLET (5 MG TOTAL) BY MOUTH DAILY.  30 tablet  3   No current facility-administered medications on file prior to visit.    BP 142/80  Pulse 103  Temp(Src) 98.8 F (37.1 C) (Oral)  Ht 5\' 4"  (1.626 m)  Wt 143 lb (64.864 kg)  BMI 24.53 kg/m2chart    Objective:   Physical Exam  Constitutional: She is oriented to person, place, and time. She appears well-developed and well-nourished.  HENT:  Right Ear: External ear normal.  Left Ear: External ear normal.  Nose: Nose normal.  Mouth/Throat: Oropharynx is clear and moist.  Maxillary sinus  tenderness to palpation.   Neck: Normal range of motion. Neck supple.  Cardiovascular: Normal rate, regular rhythm and normal heart sounds.   Pulmonary/Chest: Effort normal and breath sounds normal.  Neurological: She is alert and oriented to person, place, and time.  Skin: Skin is warm.  Psychiatric: She has a normal mood and affect.          Assessment & Plan:  Assessment: 1. Acute sinusitis-Avelox 400 mg once daily x7 days. Continue Flonase 2 sprays in each nostril once a day. Rest. Drink plenty of fluids. Consider referral to ENT if chronic sinusitis persists. 2. Tobacco Abuse- Encouraged smoking cessation.

## 2013-10-19 NOTE — Progress Notes (Signed)
Pre visit review using our clinic review tool, if applicable. No additional management support is needed unless otherwise documented below in the visit note. 

## 2013-10-22 ENCOUNTER — Telehealth: Payer: Self-pay | Admitting: Family

## 2013-10-22 NOTE — Telephone Encounter (Signed)
Relevant patient education assigned to patient using Emmi. ° °

## 2013-10-29 ENCOUNTER — Encounter: Payer: Self-pay | Admitting: Family

## 2013-12-11 ENCOUNTER — Ambulatory Visit (INDEPENDENT_AMBULATORY_CARE_PROVIDER_SITE_OTHER): Payer: 59 | Admitting: Neurology

## 2013-12-11 ENCOUNTER — Encounter: Payer: Self-pay | Admitting: Neurology

## 2013-12-11 VITALS — BP 114/74 | HR 92 | Wt 143.5 lb

## 2013-12-11 DIAGNOSIS — R209 Unspecified disturbances of skin sensation: Secondary | ICD-10-CM

## 2013-12-11 DIAGNOSIS — R252 Cramp and spasm: Secondary | ICD-10-CM

## 2013-12-11 NOTE — Progress Notes (Signed)
[facility] Clinic Note - Initial Visit   Date: 12/11/2013    [name] MRN: [id number] DOB: 1955-12-12   Dear Dr Orvan Falconer:  Thank you for your kind referral of [name] for consultation of myalgias. Although her history is well known to you, please allow [place] to reiterate it for the purpose of our medical record. The patient was accompanied to the clinic by self.     History of Present Illness: [name] is a 58 y.o. right-handed Caucasian female with history of coronary artery disease (MI in 06/2013 s/p PTCA and stenting of RCA), peripheral artery disease (severe disease R external iliac artery), type 2 diabetes (HbA1c 7.3), hyperlipidemia, depression, fibromyalgia, history of leukocytosis, and tobacco use presenting for evaluation of muscle aches.  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 Over the years, her muscle spasms have increased in frequency, now occuring daily and have involved her bilateral leg, bilateral arms, and low back.  She says muscles feel weak and she gets shaky. She has tried flexiril which has not helped. She has associated sharp pain over the shoulders which radiates down her arm.  Denies any exacerbating factors.  She reports all of her problems have been going for a number of years.  Symptoms are worse especially with stress and was recently demoted at work.  She is working with her cardiologist to get disability.  She sees a behavioral therapist for depression and has had two psychiatrist hospitalizations in the past.    Out-side paper records, electronic medical record, and images have been reviewed where available and summarized as:  XR cervical spine 07/01/2012:  Stable spondylosis with asymmetric facet hypertrophy on the right. No acute osseous findings. CT head wo contrast 12/11/2008:  No acute intracranial abnormality.  Lab Results  Component Value Date   HGBA1C 7.3*  08/13/2013   Lab Results  Component Value Date   K 4.8 08/13/2013     Past Medical History  Diagnosis Date  . Diabetes mellitus   . Fibromyalgia   . Coronary artery disease   . Anxiety   . Depression   . Asthma   . Acute MI, inferior wall, initial episode of care 07/08/2012    Cath-07/08/11 -distal RCA stent DES (99%), LAD 20-30%. EF 50% inferior hypokinesis  (infarct)  . Tobacco use disorder 07/08/2012  . Leukocytosis   . Monocytosis   . Peripheral arterial disease 01/23/2013    Past Surgical History  Procedure Laterality Date  . Cardiac catheterization       Medications:  Current Outpatient Prescriptions on File Prior to Visit  Medication Sig Dispense Refill  . aspirin 81 MG tablet Take 1 tablet (81 mg total) by mouth 2 (two) times daily.      . busPIRone (BUSPAR) 15 MG tablet Take 15 mg by mouth 2 (two) times daily.      . clopidogrel (PLAVIX) 75 MG tablet Take 1 tablet (75 mg total) by mouth daily.  90 tablet  1  . cyclobenzaprine (FLEXERIL) 10 MG tablet Take 1 tablet (10 mg total) by mouth at bedtime as needed.  90 tablet  1  . fluticasone (FLONASE) 50 MCG/ACT nasal spray Place 2 sprays into both nostrils daily.  16 g  6  . isosorbide mononitrate (IMDUR) 60 MG 24 hr tablet Take 1 tablet (60 mg total) by mouth daily.  30 tablet  11  . metFORMIN (GLUCOPHAGE-XR) 500 MG 24 hr tablet TAKE 4 TABLETS  DAILY WITH SUPPER  120 tablet  3  . moxifloxacin (AVELOX) 400 MG tablet Take 1 tablet (400 mg total) by mouth daily.  7 tablet  0  . nitroGLYCERIN (NITROSTAT) 0.4 MG SL tablet Place 1 tablet (0.4 mg total) under the tongue every 5 (five) minutes x 3 doses as needed for chest pain.  30 tablet  12  . ONGLYZA 5 MG TABS tablet TAKE 1 TABLET (5 MG TOTAL) BY MOUTH DAILY.  30 tablet  3   No current facility-administered medications on file prior to visit.    Allergies:  Allergies  Allergen Reactions  . Statins     SEVERE MUSCLE PAIN  . Ace Inhibitors Cough    Patient refuses to  take meds    Family History: Family History  Problem Relation Age of Onset  . Kidney disease Mother   . Diabetes Mother     DM Type II  . Diabetes Father     IDDM  . Heart disease Father   . Cancer Father     bone cancer died at 6252  . Cancer Sister     bone cancer at age 56  . Multiple sclerosis Daughter   . Diabetes Son 2    DM Type I  . Heart disease Maternal Grandfather   . Heart disease Paternal Grandmother   . Diabetes Son 5113    DM Type I    Social History: History   Social History  . Marital Status: Married    Spouse Name: N/A    Number of Children: 3  . Years of Education: N/A   Occupational History  .  Unemployed   Social History Main Topics  . Smoking status: Current Every Day Smoker -- 1.50 packs/day  . Smokeless tobacco: Not on file  . Alcohol Use: Yes     Comment: Occasionally  . Drug Use: No  . Sexual Activity: Yes   Other Topics Concern  . Not on file   Social History Narrative   Lives with husband.      Review of Systems:  CONSTITUTIONAL: No fevers, chills, night sweats, or weight loss.   EYES: +visual changes or eye pain ENT: No hearing changes.  No history of nose bleeds.   RESPIRATORY: No cough, wheezing and shortness of breath.  +swelling CARDIOVASCULAR: Negative for chest pain, and palpitations.   GI: Negative for abdominal discomfort, blood in stools or black stools.  +diarrhea.   GU:  No history of incontinence.   MUSCLOSKELETAL: No history of joint pain or swelling.  No myalgias.   SKIN: Negative for lesions, rash, +itching.   HEMATOLOGY/ONCOLOGY: N+ prolonged bleeding, bruising easily, and swollen nodes.    ENDOCRINE: +r cold or heat intolerance, polydipsia or goiter.   PSYCH:  +depression or anxiety symptoms.   NEURO: As Above.   Vital Signs:  BP 114/74  Pulse 92  Wt 143 lb 8 oz (65.091 kg)  SpO2 98%  Neurological Exam: MENTAL STATUS including orientation to time, place, person, recent and remote memory, attention  span and concentration, language, and fund of knowledge is normal.  She is anxious appearing.  Speech is not dysarthric.  CRANIAL NERVES: II:  No visual field defects.  Unremarkable fundi.   III-IV-VI: Pupils equal round and reactive to light.  Normal conjugate, extra-ocular eye movements in all directions of gaze.  No nystagmus.  No ptosis.   V:  Normal facial sensation.    VII:  Normal facial symmetry and movements.   VIII:  Normal hearing and vestibular function.   IX-X:  Normal palatal movement.   XI:  Normal shoulder shrug and head rotation.   XII:  Normal tongue strength and range of motion, no deviation or fasciculation.  MOTOR:  No atrophy, fasciculations or abnormal movements.  No pronator drift.  Tone is normal.    Right Upper Extremity:    Left Upper Extremity:    Deltoid  5/5   Deltoid  5/5   Biceps  5/5   Biceps  5/5   Triceps  5/5   Triceps  5/5   Wrist extensors  5/5   Wrist extensors  5/5   Wrist flexors  5/5   Wrist flexors  5/5   Finger extensors  5/5   Finger extensors  5/5   Finger flexors  5/5   Finger flexors  5/5   Dorsal interossei  5/5   Dorsal interossei  5/5   Abductor pollicis  5/5   Abductor pollicis  5/5   Tone (Ashworth scale)  0  Tone (Ashworth scale)  0   Right Lower Extremity:    Left Lower Extremity:    Hip flexors  5/5   Hip flexors  5/5   Hip extensors  5/5   Hip extensors  5/5   Knee flexors  5/5   Knee flexors  5/5   Knee extensors  5/5   Knee extensors  5/5   Dorsiflexors  5/5   Dorsiflexors  5/5   Plantarflexors  5/5   Plantarflexors  5/5   Toe extensors  5/5   Toe extensors  5/5   Toe flexors  5/5   Toe flexors  5/5   Tone (Ashworth scale)  0  Tone (Ashworth scale)  0   MSRs:  Right                                                                 Left brachioradialis 2+  brachioradialis 2+  biceps 2+  biceps 2+  triceps 2+  triceps 2+  patellar 2+  patellar 2+  ankle jerk 1+  ankle jerk 1+  Hoffman no  Hoffman no  plantar response  down  plantar response down   SENSORY:  Reduced pin prick over the right lateral lower leg, otherwise normal and symmetric perception of light touch, pinprick, vibration, and proprioception.  Romberg's sign absent.   COORDINATION/GAIT: Normal finger-to- nose-finger and heel-to-shin.  Intact rapid alternating movements bilaterally.  Able to rise from a chair without using arms.  Gait narrow based and stable. Tandem and stressed gait intact.    IMPRESSION: [name] is a 58 year-old female presenting for evaluation of myalgias.  Her exam is notable for diminished pin prick over the right lateral lower leg in a patchy distribution and otherwise non-focal.  I do not find evidence of myopathy or neuropathy on exam and suspect that her symptoms may be due to benign muscle cramps.  I reassured her that based on her essentially normal exam, I do not feel that she has a worrisome neurodegenerative disorder.  It would be difficult to determine if pain/mylagias is due to underlying fibromyalgia, so have recommended EMG.   I will check labs screening for myopathy, thyroid disease, and nutritional deficiency which can contribute to weakness/cramps.  To better characterize the nature of her symptoms, NCS/EMG of the left side would be helpful, but patient deferred testing.  Symptomatic management with tonic water and leg stretches was recommended.     PLAN/RECOMMENDATIONS:  1.  Check TSH, CK, aldolase, vitamin E, vitamin B12 2.  Recommended EMG of the left side, but patient deferred 3.  Recommended drinking 2-3 glasses of tonic water at bedtime 4.  Recommend leg stretches at bedtime 5.  Return to clinic 1-months  The duration of this appointment visit was 45 minutes of face-to-face time with the patient.  Greater than 50% of this time was spent in counseling, explanation of diagnosis, planning of further management, and coordination of care.   Thank you for allowing me to participate in patient's care.   If I can answer any additional questions, I would be pleased to do so.    Sincerely,    Donika K. Allena Katz, DO

## 2013-12-11 NOTE — Patient Instructions (Signed)
1.  Check TSH, CK, aldolase, vitamin E, vitamin B12 2.  Recommended EMG of the left side, but patient deferred 3.  Recommended drinking 2-3 glasses of tonic water at bedtime 4.  Recommend leg stretches at bedtime 5.  Return to clinic 623-months

## 2013-12-24 ENCOUNTER — Encounter: Payer: Self-pay | Admitting: Neurology

## 2013-12-26 ENCOUNTER — Ambulatory Visit (INDEPENDENT_AMBULATORY_CARE_PROVIDER_SITE_OTHER): Payer: 59 | Admitting: Neurology

## 2013-12-26 ENCOUNTER — Encounter: Payer: Self-pay | Admitting: Neurology

## 2013-12-26 VITALS — BP 130/78 | HR 68 | Ht 63.5 in | Wt 143.5 lb

## 2013-12-26 DIAGNOSIS — R209 Unspecified disturbances of skin sensation: Secondary | ICD-10-CM

## 2013-12-26 DIAGNOSIS — M79609 Pain in unspecified limb: Secondary | ICD-10-CM

## 2013-12-26 NOTE — Progress Notes (Signed)
Reason for visit: Muscle pain, leg pain  [name] is a 58 y.o. female  History of present illness:  Ms. [name] is a 58 year old right-handed white female with a history of diabetes, and fibromyalgia. The patient indicates that she has had neuromuscular issues for 15-20 years. The patient indicates that her issues started when she noted some swollen "knots" on the back of her neck. The patient shortly thereafter had the diagnosis of fibromyalgia and diabetes. The patient has been found to have evidence of a chronic Epstein-Barr virus infection. The patient has developed migratory pain in the neck, shoulders, back, and thighs. The patient has had intermittent episodes with a bandlike sensation around the thoracic level at this 6 and 7 levels that has recurred recently. The patient has severe fatigue, cognitive problems, emotional lability. The patient over the last 2 years has had some pain and type sensations below the knee on the right leg on the anterolateral aspect going down to the foot. The patient feels that this does affect her walking some. The patient will have some numbness in the feet and some sensation of weakness in the feet. The patient has some stress incontinence of bladder, and some occasional fecal incontinence. The patient does have problems with depression. The patient is sent to this office for evaluation primarily of the right leg issues.  Past Medical History  Diagnosis Date  . Diabetes mellitus   . Fibromyalgia   . Coronary artery disease   . Anxiety   . Depression   . Asthma   . Acute MI, inferior wall, initial episode of care 07/08/2012    Cath-07/08/11 -distal RCA stent DES (99%), LAD 20-30%. EF 50% inferior hypokinesis  (infarct)  . Tobacco use disorder 07/08/2012  . Leukocytosis   . Monocytosis   . Peripheral arterial disease 01/23/2013    Past Surgical History  Procedure Laterality Date  . Cardiac catheterization    . Tonsillectomy    . Cyst  removal trunk  1977    tailbone  . Coronary stent placement      Family History  Problem Relation Age of Onset  . Kidney disease Mother   . Diabetes Mother     DM Type II  . Alzheimer's disease Mother   . Diabetes Father     IDDM  . Heart disease Father   . Cancer Father     bone cancer died at 79  . Cancer Sister     bone cancer at age 67  . Multiple sclerosis Daughter   . Diabetes Son 2    DM Type I  . Heart disease Maternal Grandfather   . Heart disease Paternal Grandmother   . Diabetes Son 76    DM Type I    Social history:  reports that she has been smoking Cigarettes.  She has a 64.5 pack-year smoking history. She has never used smokeless tobacco. She reports that she drinks alcohol. She reports that she does not use illicit drugs.  Medications:  Current Outpatient Prescriptions on File Prior to Visit  Medication Sig Dispense Refill  . aspirin 81 MG tablet Take 1 tablet (81 mg total) by mouth 2 (two) times daily.      . busPIRone (BUSPAR) 15 MG tablet Take 15 mg by mouth 2 (two) times daily.      . clopidogrel (PLAVIX) 75 MG tablet Take 1 tablet (75 mg total) by mouth daily.  90 tablet  1  . cyclobenzaprine (FLEXERIL) 10 MG tablet Take  1 tablet (10 mg total) by mouth at bedtime as needed.  90 tablet  1  . fluticasone (FLONASE) 50 MCG/ACT nasal spray Place 2 sprays into both nostrils daily.  16 g  6  . isosorbide mononitrate (IMDUR) 60 MG 24 hr tablet Take 1 tablet (60 mg total) by mouth daily.  30 tablet  11  . metFORMIN (GLUCOPHAGE-XR) 500 MG 24 hr tablet TAKE 4 TABLETS DAILY WITH SUPPER  120 tablet  3  . moxifloxacin (AVELOX) 400 MG tablet Take 1 tablet (400 mg total) by mouth daily.  7 tablet  0  . nitroGLYCERIN (NITROSTAT) 0.4 MG SL tablet Place 1 tablet (0.4 mg total) under the tongue every 5 (five) minutes x 3 doses as needed for chest pain.  30 tablet  12  . ONGLYZA 5 MG TABS tablet TAKE 1 TABLET (5 MG TOTAL) BY MOUTH DAILY.  30 tablet  3   No current  facility-administered medications on file prior to visit.      Allergies  Allergen Reactions  . Statins     SEVERE MUSCLE PAIN  . Ace Inhibitors Cough    Patient refuses to take meds    ROS:  Out of a complete 14 system review of symptoms, the patient complains only of the following symptoms, and all other reviewed systems are negative.  Fevers, chills, fatigue Swelling in the legs Hearing loss, trouble swallowing Itching Blurred vision, eye pain Cough Urination problems Easy bruising, easy bleeding Feeling hot, cold Muscle cramps, achy muscles Allergies, skin sensitivity, frequent infections Memory loss, numbness, weakness, difficulty swallowing, tremor Depression, anxiety, none of sleep, decreased energy, change in appetite, disinterest in activities, suicidal thoughts, racing thoughts Sleepiness, snoring, shift work  Blood pressure 130/78, pulse 68, height 5' 3.5" (1.613 m), weight 143 lb 8 oz (65.091 kg).  Physical Exam  General: The patient is alert and cooperative at the time of the examination.  Eyes: Pupils are equal, round, and reactive to light. Discs are flat bilaterally.  Neck: The neck is supple, no carotid bruits are noted.  Respiratory: The respiratory examination is clear.  Cardiovascular: The cardiovascular examination reveals a regular rate and rhythm, no obvious murmurs or rubs are noted.  Skin: Extremities are without significant edema.  Neurologic Exam  Mental status: The patient is alert and oriented x 3 at the time of the examination. The patient has apparent normal recent and remote memory, with an apparently normal attention span and concentration ability. Mini-Mental status examination done today shows a total score of 30/30.  Cranial nerves: Facial symmetry is present. There is good sensation of the face to pinprick and soft touch on the left, decreased slightly on the right. The strength of the facial muscles and the muscles to head  turning and shoulder shrug are normal bilaterally. Speech is well enunciated, no aphasia or dysarthria is noted. Extraocular movements are full. Visual fields are full. The tongue is midline, and the patient has symmetric elevation of the soft palate. No obvious hearing deficits are noted.  Motor: The motor testing reveals 5 over 5 strength of all 4 extremities. Good symmetric motor tone is noted throughout.  Sensory: Sensory testing is intact to pinprick, soft touch, vibration sensation, and position sense on all 4 extremities , with exception that there is some decreased pinprick sedation on the right leg, decreased vibration sensation on the right arm and leg. No evidence of extinction is noted.  Coordination: Cerebellar testing reveals good finger-nose-finger and heel-to-shin bilaterally.  Gait and  station: Gait is normal. Tandem gait is normal. Romberg is negative. No drift is seen.  Reflexes: Deep tendon reflexes are symmetric and normal bilaterally. Toes are downgoing bilaterally.   Assessment/Plan:  1. Fibromyalgia  2. Right leg discomfort  3. Diabetes  The patient has a lot of neuromuscular complaints that included right leg discomfort and tightness. The patient has a relative right sided hemisensory deficit by clinical examination, and she reports a bandlike sensation of discomfort and pressure in the midthoracic level. The patient will be sent for evaluation for demyelinating disease, and a MRI of the brain will be done. The patient will have nerve conduction studies of both legs, right arm, and EMG evaluation of the right leg. Blood work will be done today as well. The patient will followup for the EMG study.  Marlan Palau. Keith Annette Bertelson MD 12/26/2013 8:56 PM  [facility] [address] [place] [id number]  Phone [phone] Fax [phone]

## 2013-12-26 NOTE — Patient Instructions (Signed)

## 2013-12-27 LAB — HIV ANTIBODY (ROUTINE TESTING W REFLEX)
HIV 1/O/2 Abs-Index Value: <1 (ref <1.00)
HIV-1/HIV-2 Ab: NONREACTIVE

## 2013-12-27 LAB — SEDIMENTATION RATE: Sed Rate: 3 mm/hr (ref 0–40)

## 2013-12-27 LAB — TSH: TSH: 2.83 u[IU]/mL (ref 0.450–4.500)

## 2013-12-27 LAB — RHEUMATOID FACTOR: Rhuematoid fact SerPl-aCnc: 7.5 IU/mL (ref 0.0–13.9)

## 2013-12-27 LAB — CK: Total CK: 53 U/L (ref 24–173)

## 2013-12-27 LAB — ANGIOTENSIN CONVERTING ENZYME: ANGIO CONVERT ENZYME: 38 U/L (ref 14–82)

## 2013-12-27 LAB — RPR: RPR: NONREACTIVE

## 2013-12-27 LAB — LYME, TOTAL AB TEST/REFLEX: Lyme IgG/IgM Ab: <0.91 {ISR} (ref 0.00–0.90)

## 2013-12-27 LAB — ANA W/REFLEX: Anti Nuclear Antibody(ANA): NEGATIVE

## 2013-12-27 LAB — VITAMIN B12: VITAMIN B 12: 595 pg/mL (ref 211–946)

## 2013-12-27 NOTE — Progress Notes (Signed)
Quick Note:  shared unremarkable results per Dr Clarisa KindredWillis's findings, thru VM message ______

## 2013-12-31 ENCOUNTER — Other Ambulatory Visit: Payer: Self-pay | Admitting: Neurology

## 2013-12-31 DIAGNOSIS — R209 Unspecified disturbances of skin sensation: Secondary | ICD-10-CM

## 2013-12-31 DIAGNOSIS — M79609 Pain in unspecified limb: Secondary | ICD-10-CM

## 2014-01-07 ENCOUNTER — Ambulatory Visit
Admission: RE | Admit: 2014-01-07 | Discharge: 2014-01-07 | Disposition: A | Discharge status: Home / Self Care | Payer: 59 | Source: Ambulatory Visit | Attending: Neurology | Admitting: Neurology

## 2014-01-07 DIAGNOSIS — R209 Unspecified disturbances of skin sensation: Secondary | ICD-10-CM

## 2014-01-07 DIAGNOSIS — M79609 Pain in unspecified limb: Secondary | ICD-10-CM

## 2014-01-08 ENCOUNTER — Telehealth: Payer: Self-pay | Admitting: Neurology

## 2014-01-08 NOTE — Telephone Encounter (Signed)
I called the patient. The MRI of the brain is normal. The blood work that was done is also normal. I will see the patient back for EMG and nerve conduction study evaluation.   MRI brain 01/08/2014:  Impression   Normal MRI brain (without).

## 2014-01-09 ENCOUNTER — Ambulatory Visit (INDEPENDENT_AMBULATORY_CARE_PROVIDER_SITE_OTHER): Payer: 59 | Admitting: Neurology

## 2014-01-09 ENCOUNTER — Telehealth: Payer: Self-pay | Admitting: Neurology

## 2014-01-09 ENCOUNTER — Encounter (INDEPENDENT_AMBULATORY_CARE_PROVIDER_SITE_OTHER): Payer: Self-pay

## 2014-01-09 DIAGNOSIS — Z0289 Encounter for other administrative examinations: Secondary | ICD-10-CM

## 2014-01-09 DIAGNOSIS — R209 Unspecified disturbances of skin sensation: Secondary | ICD-10-CM

## 2014-01-09 DIAGNOSIS — IMO0001 Reserved for inherently not codable concepts without codable children: Secondary | ICD-10-CM

## 2014-01-09 DIAGNOSIS — M79609 Pain in unspecified limb: Secondary | ICD-10-CM

## 2014-01-09 MED ORDER — PREGABALIN 25 MG PO CAPS: ORAL_CAPSULE | ORAL | Status: DC

## 2014-01-09 NOTE — Procedures (Signed)
     HISTORY:  [name] is a 58 year old patient with a history of neuromuscular symptoms over the last 15-20 years with migratory pain in the neck, shoulders, back, and legs. The patient has some numbness and discomfort below the knee on the right leg, and she has a history of diabetes. The patient being evaluated for a possible neuropathy or a lumbosacral radiculopathy.  NERVE CONDUCTION STUDIES:  Nerve conduction studies were performed on the right upper extremity. The distal motor latencies and motor amplitudes for the median and ulnar nerves were within normal limits. The F wave latencies and nerve conduction velocities for these nerves were also normal. The sensory latencies for the median and ulnar nerves were normal.  Nerve conduction studies were performed on both lower extremities. The distal motor latencies and motor amplitudes for the peroneal and posterior tibial nerves were within normal limits. The nerve conduction velocities for these nerves were also normal. The H reflex latencies normal. The sensory latencies for the peroneal nerves were within normal limits.   EMG STUDIES:  EMG study was performed on the right lower extremity:  The tibialis anterior muscle reveals 2 to 4K motor units with full recruitment. No fibrillations or positive waves were seen. The peroneus tertius muscle reveals 2 to 4K motor units with full recruitment. No fibrillations or positive waves were seen. The medial gastrocnemius muscle reveals 1 to 3K motor units with full recruitment. No fibrillations or positive waves were seen. The vastus lateralis muscle reveals 2 to 4K motor units with full recruitment. No fibrillations or positive waves were seen. The iliopsoas muscle reveals 2 to 4K motor units with full recruitment. No fibrillations or positive waves were seen. The biceps femoris muscle (long head) reveals 2 to 4K motor units with full recruitment. No fibrillations or positive waves were  seen. The lumbosacral paraspinal muscles were tested at 3 levels, and revealed no abnormalities of insertional activity at all 3 levels tested. There was good relaxation.   IMPRESSION:  Nerve conduction studies done on the right upper extremity and both lower extremities were within normal limits. No evidence of a peripheral neuropathy is seen. EMG evaluation of the right lower extremity was normal, without evidence of an overlying lumbosacral radiculopathy.  Marlan Palau. Keith Misk Galentine MD 01/09/2014 11:03 AM  [facility] [address] [place], [place] [id number]  Phone [phone] Fax [phone]

## 2014-01-09 NOTE — Progress Notes (Signed)
[name] is a [age] year old patient with a history of fibromyalgia with migratory pain over the last 15-20 years involving the neck, shoulders, low back and thighs. The patient has a history of diabetes. The patient comes in for EMG and nerve conduction study evaluation.  Nerve conduction studies on the right arm and both legs were normal. No evidence of a peripheral neuropathy is seen. EMG of the right leg was unremarkable.  The patient likely has fibromyalgia, blood work done previously was unremarkable, and the MRI the brain looking for demyelinating disease was normal. The patient will be placed on Lyrica in low dose, followup in 3-4 months. In the past, the patient could not tolerate Cymbalta secondary to drowsiness.

## 2014-01-09 NOTE — Telephone Encounter (Signed)
LM for patient to call and schedule 4 month f/u appt with Dr. Anne HahnWillis.

## 2014-01-24 ENCOUNTER — Ambulatory Visit (INDEPENDENT_AMBULATORY_CARE_PROVIDER_SITE_OTHER): Payer: 59 | Admitting: Family

## 2014-01-24 ENCOUNTER — Encounter: Payer: Self-pay | Admitting: Family

## 2014-01-24 VITALS — BP 128/64 | HR 86 | Temp 98.2°F | Wt 144.0 lb

## 2014-01-24 DIAGNOSIS — F17200 Nicotine dependence, unspecified, uncomplicated: Secondary | ICD-10-CM

## 2014-01-24 DIAGNOSIS — F411 Generalized anxiety disorder: Secondary | ICD-10-CM

## 2014-01-24 DIAGNOSIS — M797 Fibromyalgia: Secondary | ICD-10-CM

## 2014-01-24 DIAGNOSIS — F3289 Other specified depressive episodes: Secondary | ICD-10-CM

## 2014-01-24 DIAGNOSIS — IMO0001 Reserved for inherently not codable concepts without codable children: Secondary | ICD-10-CM

## 2014-01-24 DIAGNOSIS — E7800 Pure hypercholesterolemia, unspecified: Secondary | ICD-10-CM

## 2014-01-24 DIAGNOSIS — I10 Essential (primary) hypertension: Secondary | ICD-10-CM

## 2014-01-24 DIAGNOSIS — F329 Major depressive disorder, single episode, unspecified: Secondary | ICD-10-CM

## 2014-01-24 DIAGNOSIS — E1165 Type 2 diabetes mellitus with hyperglycemia: Principal | ICD-10-CM

## 2014-01-24 DIAGNOSIS — Z720 Tobacco use: Secondary | ICD-10-CM

## 2014-01-24 LAB — COMPREHENSIVE METABOLIC PANEL
ALBUMIN: 4.4 g/dL (ref 3.5–5.2)
ALT: 21 U/L (ref 0–35)
AST: 22 U/L (ref 0–37)
Alkaline Phosphatase: 69 U/L (ref 39–117)
BUN: 12 mg/dL (ref 6–23)
CALCIUM: 9.9 mg/dL (ref 8.4–10.5)
CHLORIDE: 105 meq/L (ref 96–112)
CO2: 28 mEq/L (ref 19–32)
Creatinine, Ser: 0.7 mg/dL (ref 0.4–1.2)
GFR: 95.16 mL/min (ref >60.00)
GLUCOSE: 174 mg/dL — AB (ref 70–99)
POTASSIUM: 5 meq/L (ref 3.5–5.1)
SODIUM: 141 meq/L (ref 135–145)
TOTAL PROTEIN: 7.5 g/dL (ref 6.0–8.3)
Total Bilirubin: 0.5 mg/dL (ref 0.3–1.2)

## 2014-01-24 LAB — CBC WITH DIFFERENTIAL/PLATELET
BASOS ABS: 0 10*3/uL (ref 0.0–0.1)
BASOS PCT: 0.1 % (ref 0.0–3.0)
EOS PCT: 2.9 % (ref 0.0–5.0)
Eosinophils Absolute: 0.4 10*3/uL (ref 0.0–0.7)
HEMATOCRIT: 49.1 % — AB (ref 36.0–46.0)
Hemoglobin: 16.2 g/dL — ABNORMAL HIGH (ref 12.0–15.0)
LYMPHS ABS: 2.1 10*3/uL (ref 0.7–4.0)
Lymphocytes Relative: 15.6 % (ref 12.0–46.0)
MCHC: 33 g/dL (ref 30.0–36.0)
MCV: 92.7 fl (ref 78.0–100.0)
MONOS PCT: 5.3 % (ref 3.0–12.0)
Monocytes Absolute: 0.7 10*3/uL (ref 0.1–1.0)
Neutro Abs: 10.3 10*3/uL — ABNORMAL HIGH (ref 1.4–7.7)
Neutrophils Relative %: 76.1 % (ref 43.0–77.0)
Platelets: 299 10*3/uL (ref 150.0–400.0)
RBC: 5.29 Mil/uL — ABNORMAL HIGH (ref 3.87–5.11)
RDW: 13.9 % (ref 11.5–14.6)
WBC: 13.6 10*3/uL — ABNORMAL HIGH (ref 4.5–10.5)

## 2014-01-24 LAB — LIPID PANEL
Cholesterol: 214 mg/dL — ABNORMAL HIGH (ref 0–200)
HDL: 47.5 mg/dL (ref >39.00)
LDL Cholesterol: 143 mg/dL — ABNORMAL HIGH (ref 0–99)
TRIGLYCERIDES: 117 mg/dL (ref 0.0–149.0)
Total CHOL/HDL Ratio: 5
VLDL: 23.4 mg/dL (ref 0.0–40.0)

## 2014-01-24 LAB — HEMOGLOBIN A1C: Hgb A1c MFr Bld: 7 % — ABNORMAL HIGH (ref 4.6–6.5)

## 2014-01-24 NOTE — Progress Notes (Signed)
Pre visit review using our clinic review tool, if applicable. No additional management support is needed unless otherwise documented below in the visit note. 

## 2014-01-24 NOTE — Patient Instructions (Signed)
Fat and Cholesterol Control Diet  Fat and cholesterol levels in your blood and organs are influenced by your diet. High levels of fat and cholesterol may lead to diseases of the heart, small and large blood vessels, gallbladder, liver, and pancreas.  CONTROLLING FAT AND CHOLESTEROL WITH DIET  Although exercise and lifestyle factors are important, your diet is key. That is because certain foods are known to raise cholesterol and others to lower it. The goal is to balance foods for their effect on cholesterol and more importantly, to replace saturated and trans fat with other types of fat, such as monounsaturated fat, polyunsaturated fat, and omega-3 fatty acids.  On average, a person should consume no more than 15 to 17 g of saturated fat daily. Saturated and trans fats are considered "bad" fats, and they will raise LDL cholesterol. Saturated fats are primarily found in animal products such as meats, butter, and cream. However, that does not mean you need to give up all your favorite foods. Today, there are good tasting, low-fat, low-cholesterol substitutes for most of the things you like to eat. Choose low-fat or nonfat alternatives. Choose round or loin cuts of red meat. These types of cuts are lowest in fat and cholesterol. Chicken (without the skin), fish, veal, and ground turkey breast are great choices. Eliminate fatty meats, such as hot dogs and salami. Even shellfish have little or no saturated fat. Have a 3 oz (85 g) portion when you eat lean meat, poultry, or fish.  Trans fats are also called "partially hydrogenated oils." They are oils that have been scientifically manipulated so that they are solid at room temperature resulting in a longer shelf life and improved taste and texture of foods in which they are added. Trans fats are found in stick margarine, some tub margarines, cookies, crackers, and baked goods.   When baking and cooking, oils are a great substitute for butter. The monounsaturated oils are  especially beneficial since it is believed they lower LDL and raise HDL. The oils you should avoid entirely are saturated tropical oils, such as coconut and palm.   Remember to eat a lot from food groups that are naturally free of saturated and trans fat, including fish, fruit, vegetables, beans, grains (barley, rice, couscous, bulgur wheat), and pasta (without cream sauces).   IDENTIFYING FOODS THAT LOWER FAT AND CHOLESTEROL   Soluble fiber may lower your cholesterol. This type of fiber is found in fruits such as apples, vegetables such as broccoli, potatoes, and carrots, legumes such as beans, peas, and lentils, and grains such as barley. Foods fortified with plant sterols (phytosterol) may also lower cholesterol. You should eat at least 2 g per day of these foods for a cholesterol lowering effect.   Read package labels to identify low-saturated fats, trans fat free, and low-fat foods at the supermarket. Select cheeses that have only 2 to 3 g saturated fat per ounce. Use a heart-healthy tub margarine that is free of trans fats or partially hydrogenated oil. When buying baked goods (cookies, crackers), avoid partially hydrogenated oils. Breads and muffins should be made from whole grains (whole-wheat or whole oat flour, instead of "flour" or "enriched flour"). Buy non-creamy canned soups with reduced salt and no added fats.   FOOD PREPARATION TECHNIQUES   Never deep-fry. If you must fry, either stir-fry, which uses very little fat, or use non-stick cooking sprays. When possible, broil, bake, or roast meats, and steam vegetables. Instead of putting butter or margarine on vegetables, use lemon   and herbs, applesauce, and cinnamon (for squash and sweet potatoes). Use nonfat yogurt, salsa, and low-fat dressings for salads.   LOW-SATURATED FAT / LOW-FAT FOOD SUBSTITUTES  Meats / Saturated Fat (g)  · Avoid: Steak, marbled (3 oz/85 g) / 11 g  · Choose: Steak, lean (3 oz/85 g) / 4 g  · Avoid: Hamburger (3 oz/85 g) / 7  g  · Choose: Hamburger, lean (3 oz/85 g) / 5 g  · Avoid: Ham (3 oz/85 g) / 6 g  · Choose: Ham, lean cut (3 oz/85 g) / 2.4 g  · Avoid: Chicken, with skin, dark meat (3 oz/85 g) / 4 g  · Choose: Chicken, skin removed, dark meat (3 oz/85 g) / 2 g  · Avoid: Chicken, with skin, light meat (3 oz/85 g) / 2.5 g  · Choose: Chicken, skin removed, light meat (3 oz/85 g) / 1 g  Dairy / Saturated Fat (g)  · Avoid: Whole milk (1 cup) / 5 g  · Choose: Low-fat milk, 2% (1 cup) / 3 g  · Choose: Low-fat milk, 1% (1 cup) / 1.5 g  · Choose: Skim milk (1 cup) / 0.3 g  · Avoid: Hard cheese (1 oz/28 g) / 6 g  · Choose: Skim milk cheese (1 oz/28 g) / 2 to 3 g  · Avoid: Cottage cheese, 4% fat (1 cup) / 6.5 g  · Choose: Low-fat cottage cheese, 1% fat (1 cup) / 1.5 g  · Avoid: Ice cream (1 cup) / 9 g  · Choose: Sherbet (1 cup) / 2.5 g  · Choose: Nonfat frozen yogurt (1 cup) / 0.3 g  · Choose: Frozen fruit bar / trace  · Avoid: Whipped cream (1 tbs) / 3.5 g  · Choose: Nondairy whipped topping (1 tbs) / 1 g  Condiments / Saturated Fat (g)  · Avoid: Mayonnaise (1 tbs) / 2 g  · Choose: Low-fat mayonnaise (1 tbs) / 1 g  · Avoid: Butter (1 tbs) / 7 g  · Choose: Extra light margarine (1 tbs) / 1 g  · Avoid: Coconut oil (1 tbs) / 11.8 g  · Choose: Olive oil (1 tbs) / 1.8 g  · Choose: Corn oil (1 tbs) / 1.7 g  · Choose: Safflower oil (1 tbs) / 1.2 g  · Choose: Sunflower oil (1 tbs) / 1.4 g  · Choose: Soybean oil (1 tbs) / 2.4 g  · Choose: Canola oil (1 tbs) / 1 g  Document Released: 09/13/2005 Document Revised: 01/08/2013 Document Reviewed: 03/04/2011  Exit[employer]® Patient Information ©2014 [employer].

## 2014-01-24 NOTE — Progress Notes (Signed)
Subjective:    Patient ID: [name], female    DOB: 09/23/1956, 58 y.o.   MRN: [id number]  HPI  58 year old white female, one half pack per day smoker, is in today for recheck of type 2 diabetes, hypertension, fibromyalgia, tobacco use disorder, depression, anxiety, bipolar disorder. She's currently under the care of neurology for fibromyalgia. She rates her pain on a daily basis around 8/10 that is improved since being started on Lyrica. She sees a therapist as well as a psychiatrist for management of her psychiatric medications. She continues to see cardiology after status post myocardial infarction. She has a history of hypercholesterolemia intolerant to statin medications. Has right leg claudication episodes of significant right lower extremity pain. She recently attempted to return to work and has been very difficult. Reports working 16 hours a day. Unable to tolerate the cement floors. Is requesting to have time off with the plans of seeking possible disability.  Review of Systems  Constitutional: Positive for fatigue.  HENT: Negative.   Respiratory: Negative.  Negative for chest tightness.   Cardiovascular: Negative.  Negative for chest pain.  Genitourinary: Negative.   Musculoskeletal: Positive for arthralgias and myalgias.  Skin: Negative.   Allergic/Immunologic: Negative.   Neurological: Positive for weakness.  Hematological: Negative.   Psychiatric/Behavioral: Positive for sleep disturbance and agitation. Negative for suicidal ideas. The patient is nervous/anxious.    Past Medical History  Diagnosis Date  . Diabetes mellitus   . Fibromyalgia   . Coronary artery disease   . Anxiety   . Depression   . Asthma   . Acute MI, inferior wall, initial episode of care 07/08/2012    Cath-07/08/11 -distal RCA stent DES (99%), LAD 20-30%. EF 50% inferior hypokinesis  (infarct)  . Tobacco use disorder 07/08/2012  . Leukocytosis   . Monocytosis   . Peripheral arterial disease  01/23/2013    History   Social History  . Marital Status: Married    Spouse Name: N/A    Number of Children: 3  . Years of Education: college   Occup[place]    Social History Main Topics  . Smoking status: Current Every Day Smoker -- 1.50 packs/day for 43 years    Types: Cigarettes  . Smokeless tobacco: Never Used  . Alcohol Use: Yes     Comment: Rarely  . Drug Use: No  . Sexual Activity: Yes   Other Topics Concern  . Not on file   Social History Narrative   Lives with husband.  They have three grown chlildren.   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   Highest level of education:  College graduate    Past Surgical History  Procedure Laterality Date  . Cardiac catheterization    . Tonsillectomy    . Cyst removal trunk  1977    tailbone  . Coronary stent placement      Family History  Problem Relation Age of Onset  . Kidney disease Mother   . Diabetes Mother     DM Type II  . Alzheimer's disease Mother   . Diabetes Father     IDDM  . Heart disease Father   . Cancer Father     bone cancer died at 2352  . Cancer Sister     bone cancer at age 56  . Multiple sclerosis Daughter   . Diabetes Son 2    DM Type I  . Heart disease Maternal Grandfather   . Heart  disease Paternal Grandmother   . Diabetes Son [id number]    DM Type I    Allergies  Allergen Reactions  . Statins     SEVERE MUSCLE PAIN  . Ace Inhibitors Cough    Patient refuses to take meds  . Cymbalta [Duloxetine Hcl]     Drowsiness    Current Outpatient Prescriptions on File Prior to Visit  Medication Sig Dispense Refill  . aspirin 81 MG tablet Take 1 tablet (81 mg total) by mouth 2 (two) times daily.      . busPIRone (BUSPAR) 15 MG tablet Take 15 mg by mouth 2 (two) times daily.      . clopidogrel (PLAVIX) 75 MG tablet Take 1 tablet (75 mg total) by mouth daily.  90 tablet  1  . cyclobenzaprine (FLEXERIL) 10 MG tablet Take 1 tablet (10 mg total) by mouth at  bedtime as needed.  90 tablet  1  . fluticasone (FLONASE) 50 MCG/ACT nasal spray Place 2 sprays into both nostrils daily.  16 g  6  . isosorbide mononitrate (IMDUR) 60 MG 24 hr tablet Take 1 tablet (60 mg total) by mouth daily.  30 tablet  11  . Lurasidone HCl (LATUDA) 20 MG TABS Take 20 mg by mouth at bedtime.      . metFORMIN (GLUCOPHAGE-XR) 500 MG 24 hr tablet TAKE 4 TABLETS DAILY WITH SUPPER  120 tablet  3  . nitroGLYCERIN (NITROSTAT) 0.4 MG SL tablet Place 1 tablet (0.4 mg total) under the tongue every 5 (five) minutes x 3 doses as needed for chest pain.  30 tablet  12  . ONGLYZA 5 MG TABS tablet TAKE 1 TABLET (5 MG TOTAL) BY MOUTH DAILY.  30 tablet  3  . pregabalin (LYRICA) 25 MG capsule 1 capsule twice daily for 2 weeks, then take 2 capsules twice daily  120 capsule  3   No current facility-administered medications on file prior to visit.    BP 128/64  Pulse 86  Temp(Src) 98.2 F (36.8 C) (Oral)  Wt 144 lb (65.318 kg)  SpO2 98%chart    Objective:   Physical Exam  Constitutional: She is oriented to person, place, and time. She appears well-developed and well-nourished.  HENT:  Right Ear: External ear normal.  Left Ear: External ear normal.  Nose: Nose normal.  Mouth/Throat: Oropharynx is clear and moist.  Neck: Normal range of motion. Neck supple.  Cardiovascular: Normal rate, regular rhythm and normal heart sounds.   Pulmonary/Chest: Effort normal and breath sounds normal.  Abdominal: Soft. Bowel sounds are normal.  Musculoskeletal: She exhibits tenderness.  multiple joint pain to palpation. No obvious swelling.   Neurological: She is alert and oriented to person, place, and time.  Skin: Skin is warm and dry.  Psychiatric: She has a normal mood and affect.          Assessment & Plan:  [name] was seen today for no specified reason.  Diagnoses and associated orders for this visit:  Type II or unspecified type diabetes mellitus without mention of complication,  uncontrolled - Hemoglobin A1c - CMP  Unspecified essential hypertension - CMP  Fibromyalgia - CMP - CBC with Differential  Depressive disorder, not elsewhere classified  Generalized anxiety disorder  Tobacco abuse  Pure hypercholesterolemia - Lipid Panel   Call the office with any questions or concerns. Recheck as scheduled and as needed.

## 2014-01-25 ENCOUNTER — Ambulatory Visit (HOSPITAL_COMMUNITY): Payer: 59 | Attending: Internal Medicine | Admitting: Cardiology

## 2014-01-25 ENCOUNTER — Encounter: Payer: Self-pay | Admitting: Cardiology

## 2014-01-25 ENCOUNTER — Telehealth: Payer: Self-pay | Admitting: Family

## 2014-01-25 ENCOUNTER — Ambulatory Visit (INDEPENDENT_AMBULATORY_CARE_PROVIDER_SITE_OTHER): Payer: 59 | Admitting: Cardiology

## 2014-01-25 ENCOUNTER — Other Ambulatory Visit (HOSPITAL_COMMUNITY): Payer: Self-pay | Admitting: Cardiology

## 2014-01-25 VITALS — BP 128/72 | HR 75 | Ht 63.5 in | Wt 145.0 lb

## 2014-01-25 DIAGNOSIS — I658 Occlusion and stenosis of other precerebral arteries: Secondary | ICD-10-CM | POA: Insufficient documentation

## 2014-01-25 DIAGNOSIS — I2119 ST elevation (STEMI) myocardial infarction involving other coronary artery of inferior wall: Secondary | ICD-10-CM

## 2014-01-25 DIAGNOSIS — F17200 Nicotine dependence, unspecified, uncomplicated: Secondary | ICD-10-CM | POA: Insufficient documentation

## 2014-01-25 DIAGNOSIS — E785 Hyperlipidemia, unspecified: Secondary | ICD-10-CM | POA: Insufficient documentation

## 2014-01-25 DIAGNOSIS — I6529 Occlusion and stenosis of unspecified carotid artery: Secondary | ICD-10-CM | POA: Insufficient documentation

## 2014-01-25 DIAGNOSIS — E119 Type 2 diabetes mellitus without complications: Secondary | ICD-10-CM | POA: Insufficient documentation

## 2014-01-25 DIAGNOSIS — I739 Peripheral vascular disease, unspecified: Secondary | ICD-10-CM

## 2014-01-25 DIAGNOSIS — I2510 Atherosclerotic heart disease of native coronary artery without angina pectoris: Secondary | ICD-10-CM | POA: Insufficient documentation

## 2014-01-25 NOTE — Telephone Encounter (Signed)
Relevant patient education assigned to patient using Emmi. ° °

## 2014-01-25 NOTE — Progress Notes (Signed)
Carotid duplex performed 

## 2014-01-25 NOTE — Progress Notes (Signed)
HPI The patient presents for followup of CAD.  Catheterization 2012 demonstrated 20% proximal stenosis and 30% mid stenosis in the LAD. The circumflex had 20% proximal stenosis. The right coronary artery and 20% stenosis with distal vessel 99% stenosis. The patient underwent PTCA and stenting of the distal RCA.  She was having significant leg pain. I sent her to see Dr. Kirke CorinArida.  He did lower extremity angiography which demonstrated, initially, severe disease in right external iliac artery. However, this improved significantly with NTG with a systolic gradient of only 10-15 mm hg. She was started on Imdur.  She had continued pain but did not tolerate up titration of this.    She returns for follow up.  She has been treated for depression and also has been seeing a neurologist for back and arm pain.  She is on Lyrica with some improvement.  She however has significant limitations in her walking and difficulty holding things with the right hand.  She has been off of work.  She denies any active cardiovascular symptoms.  The patient denies any new symptoms such as chest discomfort, neck or arm discomfort. There has been no new shortness of breath, PND or orthopnea. There have been no reported palpitations, presyncope or syncope.  She does have chronic dyspnea.    Allergies  Allergen Reactions  . Statins     SEVERE MUSCLE PAIN  . Ace Inhibitors Cough    Patient refuses to take meds  . Cymbalta [Duloxetine Hcl]     Drowsiness    Current Outpatient Prescriptions  Medication Sig Dispense Refill  . aspirin 81 MG tablet Take 1 tablet (81 mg total) by mouth 2 (two) times daily.      . busPIRone (BUSPAR) 15 MG tablet Take 15 mg by mouth 2 (two) times daily.      . clonazePAM (KLONOPIN) 0.5 MG tablet Take 0.5 mg by mouth 2 (two) times daily.      . clopidogrel (PLAVIX) 75 MG tablet Take 1 tablet (75 mg total) by mouth daily.  90 tablet  1  . cyclobenzaprine (FLEXERIL) 10 MG tablet Take 1 tablet (10 mg  total) by mouth at bedtime as needed.  90 tablet  1  . escitalopram (LEXAPRO) 10 MG tablet Take 10 mg by mouth at bedtime.      . fluticasone (FLONASE) 50 MCG/ACT nasal spray Place 2 sprays into both nostrils daily.  16 g  6  . isosorbide mononitrate (IMDUR) 60 MG 24 hr tablet Take 1 tablet (60 mg total) by mouth daily.  30 tablet  11  . Lurasidone HCl (LATUDA) 20 MG TABS Take 20 mg by mouth at bedtime.      . metFORMIN (GLUCOPHAGE-XR) 500 MG 24 hr tablet TAKE 4 TABLETS DAILY WITH SUPPER  120 tablet  3  . nitroGLYCERIN (NITROSTAT) 0.4 MG SL tablet Place 1 tablet (0.4 mg total) under the tongue every 5 (five) minutes x 3 doses as needed for chest pain.  30 tablet  12  . ONGLYZA 5 MG TABS tablet TAKE 1 TABLET (5 MG TOTAL) BY MOUTH DAILY.  30 tablet  3  . pregabalin (LYRICA) 25 MG capsule 1 capsule twice daily for 2 weeks, then take 2 capsules twice daily  120 capsule  3   No current facility-administered medications for this visit.    Past Medical History  Diagnosis Date  . Diabetes mellitus   . Fibromyalgia   . Coronary artery disease   . Anxiety   .  Depression   . Asthma   . Acute MI, inferior wall, initial episode of care 07/08/2012    Cath-07/08/11 -distal RCA stent DES (99%), LAD 20-30%. EF 50% inferior hypokinesis  (infarct)  . Tobacco use disorder 07/08/2012  . Leukocytosis   . Monocytosis   . Peripheral arterial disease 01/23/2013    Past Surgical History  Procedure Laterality Date  . Cardiac catheterization    . Tonsillectomy    . Cyst removal trunk  1977    tailbone  . Coronary stent placement      ROS:  As stated in the HPI and negative for all other systems.  PHYSICAL EXAM BP 128/72  Pulse 75  Ht 5' 3.5" (1.613 m)  Wt 145 lb (65.772 kg)  BMI 25.28 kg/m2  SpO2 98% GENERAL:  Well appearing NECK:  No jugular venous distention, waveform within normal limits, carotid upstroke brisk and symmetric, soft right carotid bruit, no thyromegaly LUNGS:  Clear to  auscultation bilaterally CHEST:  Unremarkable HEART:  PMI not displaced or sustained,S1 and S2 within normal limits, no S3, no S4, no clicks, no rubs, no murmurs ABD:  Flat, positive bowel sounds normal in frequency in pitch, no bruits, no rebound, no guarding, no midline pulsatile mass, no hepatomegaly, no splenomegaly EXT:  2 plus pulses upper, 1+ dorsalis pedis bilateral, absent posterior tibialis bilateral, no edema, no cyanosis no clubbing  ASSESSMENT AND PLAN  CAD  The patient has no new sypmtoms.  No further cardiovascular testing is indicated.  We will continue with aggressive risk reduction and meds as listed.    Leg pain She will continue current medications and we discussed when necessary dosing of sublingual nitroglycerin.  Tobacco abuse We discussed this again today  She has plans to quit tomorrow.   Dyslipidemia She has been intolerant of all statins.  Carotid stenosis She has some mild left greater than right carotid stenosis. We will follow this up this month.  Sleep apnea She was diagnosed with this and is using CPAP.

## 2014-01-25 NOTE — Patient Instructions (Signed)
Your physician recommends that you continue on your current medications as directed. Please refer to the Current Medication list given to you today.  You have been [facility] to the [facility], Please schedule this appointment at check out today before leaving the office.   Your physician wants you to follow-up in: 1 Year with Dr Hoyle BarrHochrein You will receive a reminder letter in the mail two months in advance. If you don't receive a letter, please call our office to schedule the follow-up appointment.

## 2014-01-29 ENCOUNTER — Ambulatory Visit: Payer: 59 | Admitting: Pharmacist

## 2014-01-31 ENCOUNTER — Ambulatory Visit (INDEPENDENT_AMBULATORY_CARE_PROVIDER_SITE_OTHER): Payer: 59 | Admitting: Pharmacist

## 2014-01-31 VITALS — Wt 146.0 lb

## 2014-01-31 DIAGNOSIS — E785 Hyperlipidemia, unspecified: Secondary | ICD-10-CM

## 2014-01-31 DIAGNOSIS — Z79899 Other long term (current) drug therapy: Secondary | ICD-10-CM

## 2014-01-31 MED ORDER — ROSUVASTATIN CALCIUM 10 MG PO TABS: 10.0000 mg | ORAL_TABLET | ORAL | Status: DC

## 2014-01-31 NOTE — Patient Instructions (Addendum)
1.  Start Crestor 10 mg once weekly (take on Sundays).  If muscle aches occur, cut dose in half, and take once weekly.   2.  Continue efforts to quit smoking.   3. If appetite picks up after smoking and find yourself snacking, handful of almonds or walnuts is a healthy option. 4.  Recheck lipid panel and hepatic panel in 4 months (06/04/14 - fasting lab - lab opens at 7:30 am, show up anytime after).  See Riki RuskJeremy Jackob [place] 2 days later on 06/06/14 at 10:00 am If LDL not low enough, may consider PCSK-9 inhibitor as they may be on the market then.

## 2014-01-31 NOTE — Progress Notes (Signed)
Patient is pleasant 58 y.o. WF referred to lipid clinic by Dr. Antoine PocheHochrein given history of MI, Diabetes, PVD, and statin intolerance.  She also has a h/o fibromyalgia which has made tolerating statins more difficult.  She did recently start on Lyrica 3 weeks ago, and tells me her joint and muscle pain from fibromyalgia is much better.  She has definately tried and failed Lipitor, Livalo, and red yeast rice due to muscle aches.  She thinks she may have tried pravastatin and zocor, but isn't positive.  She doesn't recall Crestor use in the past.  She is not on any type of injections for her diabetes, but had to give these to her sons for years.  She feels she will eventually be on injections, so isn't adamantly opposed to using them if needed when the PCSK-9 inhibitors become available and she needs to use one.    RF:  MI (2013), Diabetes, mild PVD, low HDL, family history (father MI in his 8540's), age - LDL goal < 70, non-HDL goal <100 Meds:  Not on lipid lowering meds currently. Intolerant:   definately tried and failed Lipitor, Livalo, and red yeast rice due to muscle aches.  She thinks she may have tried pravastatin and zocor, but isn't positive.  Family history:  Father had an MI in his 6340's.  Mother had no heart disease.  She is the only child on her father's side.  Has 4 half brother's/sisters from mom's side, and no heart disease with them. Social history:  Currently smoking 1/2 - 1 ppd, and trying to quit completely now since seeing Dr. Antoine PocheHochrein last week.  She hopes to quit in near future.  She is applying for disability coverage this week. Diet:  Patient tells me she eats a relatively low fat / low carb diet as she has been eating a "diabetic diet" for years.  She does admit her weakness if butter and cream, and she won't give that up.  Doesn't eat fat food or fried foods.  Labs:   12/2013:  TC 214, TG 117, LDL 143, HDL 48, A1C 7.0, LFTs normal, Renal function normal (not on lipid lowering  meds)  Current Outpatient Prescriptions  Medication Sig Dispense Refill  . aspirin 81 MG tablet Take 1 tablet (81 mg total) by mouth 2 (two) times daily.      . busPIRone (BUSPAR) 15 MG tablet Take 15 mg by mouth 2 (two) times daily.      . clonazePAM (KLONOPIN) 0.5 MG tablet Take 0.5 mg by mouth 2 (two) times daily.      . clopidogrel (PLAVIX) 75 MG tablet Take 1 tablet (75 mg total) by mouth daily.  90 tablet  1  . cyclobenzaprine (FLEXERIL) 10 MG tablet Take 1 tablet (10 mg total) by mouth at bedtime as needed.  90 tablet  1  . escitalopram (LEXAPRO) 10 MG tablet Take 10 mg by mouth at bedtime.      . fluticasone (FLONASE) 50 MCG/ACT nasal spray Place 2 sprays into both nostrils daily.  16 g  6  . isosorbide mononitrate (IMDUR) 60 MG 24 hr tablet Take 1 tablet (60 mg total) by mouth daily.  30 tablet  11  . Lurasidone HCl (LATUDA) 20 MG TABS Take 20 mg by mouth at bedtime.      . metFORMIN (GLUCOPHAGE-XR) 500 MG 24 hr tablet TAKE 4 TABLETS DAILY WITH SUPPER  120 tablet  3  . nitroGLYCERIN (NITROSTAT) 0.4 MG SL tablet Place 1 tablet (0.4  mg total) under the tongue every 5 (five) minutes x 3 doses as needed for chest pain.  30 tablet  12  . ONGLYZA 5 MG TABS tablet TAKE 1 TABLET (5 MG TOTAL) BY MOUTH DAILY.  30 tablet  3  . pregabalin (LYRICA) 25 MG capsule 1 capsule twice daily for 2 weeks, then take 2 capsules twice daily  120 capsule  3   No current facility-administered medications for this visit.   Allergies  Allergen Reactions  . Statins     SEVERE MUSCLE PAIN - failed lipitor, Livalo, red yeast rice (lovastatin), and she thinks Zocor and pravastatin  . Ace Inhibitors Cough    Patient refuses to take meds  . Cymbalta [Duloxetine Hcl]     Drowsiness   Family History  Problem Relation Age of Onset  . Kidney disease Mother   . Diabetes Mother     DM Type II  . Alzheimer's disease Mother   . Diabetes Father     IDDM  . Heart disease Father   . Cancer Father     bone cancer  died at 1152  . Cancer Sister     bone cancer at age 56  . Multiple sclerosis Daughter   . Diabetes Son 2    DM Type I  . Heart disease Maternal Grandfather   . Heart disease Paternal Grandmother   . Diabetes Son [id number]    DM Type I

## 2014-01-31 NOTE — Assessment & Plan Note (Addendum)
Patient needs a 50% reduction given h/o MI, PVD, Diabetes and baseline LDL ~ 145 mg/dL.  Patient is very apprehensive about taking statins given her history of muscle aches on them, however since her new medication (Lyrica) is controlling fibromyalgia so well, she was open to starting Crestor 10 mg once weekly, which can hopefully achieve at least a 20% reduction.  I explained to her what the PCSK-9 inhibitors were, and that they would likely be available in 3-4 months, and may be a good fit for her.  She tells me she has given SQ shots to her sons, but never to herself.  She is not opposed to using these agents if they are covered by her insurance [employer]).  She will add weekly Crestor today, recheck lipid/liver in 4 months, then see me 2 days later.  Will likely know status of PCSK-9 agents at that time, and can discuss using one of these vs increasing Crestor (if she is tolerating) at that time. Plan: 1.  Start Crestor 10 mg once weekly (take on Sundays).  If muscle aches occur, cut dose in half, and take once weekly.   2.  Continue efforts to quit smoking.   3. If appetite picks up after smoking and find yourself snacking, handful of almonds or walnuts is a healthy option. 4.  Recheck lipid panel and hepatic panel in 4 months (06/04/14 - fasting lab - lab opens at 7:30 am, show up anytime after).  See Riki RuskJeremy Gavin Faivre 2 days later on 06/06/14 at 10:00 am If LDL not low enough, may consider PCSK-9 inhibitor as they may be on the market then.

## 2014-02-04 ENCOUNTER — Telehealth: Payer: Self-pay

## 2014-02-04 NOTE — Telephone Encounter (Signed)
Relevant patient education assigned to patient using Emmi. ° °

## 2014-02-05 ENCOUNTER — Encounter: Payer: Self-pay | Admitting: Neurology

## 2014-02-12 ENCOUNTER — Telehealth: Payer: Self-pay | Admitting: Cardiology

## 2014-02-12 NOTE — Telephone Encounter (Signed)
Walk in Pt Form " Enveloped Dropped Off" Disability paperwork inside [employer] to [employer] [id number]/kdm

## 2014-02-15 ENCOUNTER — Encounter: Payer: Self-pay | Admitting: *Deleted

## 2014-02-21 ENCOUNTER — Telehealth: Payer: Self-pay | Admitting: Family

## 2014-02-21 NOTE — Telephone Encounter (Signed)
Pt would like to know if her Leave of Absence can be extended until she is seen on June 4??

## 2014-02-21 NOTE — Telephone Encounter (Signed)
Please advise 

## 2014-02-22 NOTE — Telephone Encounter (Signed)
LMOM for pt, advising her of the "ok" to extended LOA till 02/28/14.

## 2014-02-22 NOTE — Telephone Encounter (Signed)
Ok

## 2014-02-28 ENCOUNTER — Ambulatory Visit (INDEPENDENT_AMBULATORY_CARE_PROVIDER_SITE_OTHER): Payer: 59 | Admitting: Family

## 2014-02-28 ENCOUNTER — Encounter: Payer: Self-pay | Admitting: Family

## 2014-02-28 VITALS — BP 130/74 | HR 95 | Temp 98.6°F | Wt 148.0 lb

## 2014-02-28 DIAGNOSIS — F329 Major depressive disorder, single episode, unspecified: Secondary | ICD-10-CM

## 2014-02-28 DIAGNOSIS — IMO0001 Reserved for inherently not codable concepts without codable children: Secondary | ICD-10-CM

## 2014-02-28 DIAGNOSIS — M25519 Pain in unspecified shoulder: Secondary | ICD-10-CM

## 2014-02-28 DIAGNOSIS — F32A Depression, unspecified: Secondary | ICD-10-CM

## 2014-02-28 DIAGNOSIS — F3289 Other specified depressive episodes: Secondary | ICD-10-CM

## 2014-02-28 DIAGNOSIS — M25511 Pain in right shoulder: Secondary | ICD-10-CM

## 2014-02-28 MED ORDER — CLOPIDOGREL BISULFATE 75 MG PO TABS
75.0000 mg | ORAL_TABLET | Freq: Every day | ORAL | Status: DC
Start: 2014-02-28 — End: 2014-09-24

## 2014-02-28 NOTE — Patient Instructions (Signed)

## 2014-03-01 ENCOUNTER — Encounter: Payer: Self-pay | Admitting: Family

## 2014-03-01 NOTE — Progress Notes (Signed)
Subjective:    Patient ID: [name], female    DOB: [birth year], 58 y.o.   MRN: [id number]  HPI 58 year old white female, smoker with a history of type 2 diabetes and depression and in today with complaints of right shoulder pain ongoing x16 years but worse over the last year. She describes it as a tight muscle tension she rates as 7/10. Intensity waxes and wanes. Has been taking Flexeril without much help. Lyrica for fibromyalgia and it seems to help. Reports increased stress makes the pain worse. Also reports right lower extremity pain and anterior shin. Pain is a 5/10 it comes and goes. No swelling, redness. The psychiatry in the therapies. She also sees cardiology who manages heart disease.   Review of Systems  Constitutional: Negative.   Respiratory: Negative.   Cardiovascular: Negative.   Gastrointestinal: Negative.   Endocrine: Negative.   Musculoskeletal: Positive for arthralgias and myalgias.       Right shoulder pain 7/10. Right leg pain   Skin: Negative.   Neurological: Negative.   Psychiatric/Behavioral: Positive for sleep disturbance. Negative for suicidal ideas. The patient is nervous/anxious.    Past Medical History  Diagnosis Date  . Diabetes mellitus   . Fibromyalgia   . Coronary artery disease   . Anxiety   . Depression   . Asthma   . Acute MI, inferior wall, initial episode of care 07/08/2012    Cath-07/08/11 -distal RCA stent DES (99%), LAD 20-30%. EF 50% inferior hypokinesis  (infarct)  . Tobacco use disorder 07/08/2012  . Leukocytosis   . Monocytosis   . Peripheral arterial disease 01/23/2013    History   Social History  . Marital Status: Married    Spouse Name: N/A    Number of Children: 3  . Years of Education: college   Occupational History  . [employer]    Social History Main Topics  . Smoking status: Current Every Day Smoker -- 1.50 packs/day for 43 years    Types: Cigarettes  . Smokeless tobacco: Never Used  . Alcohol Use:  Yes     Comment: Rarely  . Drug Use: No  . Sexual Activity: Yes   Other Topics Concern  . Not on file   Social History Narrative   Lives with husband.  They have three grown chlildren.   She is working as a Radiographer, therapeutic.   Highest level of education:  College graduate    Past Surgical History  Procedure Laterality Date  . Cardiac catheterization    . Tonsillectomy    . Cyst removal trunk  1977    tailbone  . Coronary stent placement      Family History  Problem Relation Age of Onset  . Kidney disease Mother   . Diabetes Mother     DM Type II  . Alzheimer's disease Mother   . Diabetes Father     IDDM  . Heart disease Father   . Cancer Father     bone cancer died at 74  . Cancer Sister     bone cancer at age 46  . Multiple sclerosis Daughter   . Diabetes Son 2    DM Type I  . Heart disease Maternal Grandfather   . Heart disease Paternal Grandmother   . Diabetes Son 55    DM Type I    Allergies  Allergen Reactions  . Statins     SEVERE MUSCLE PAIN - failed lipitor, Livalo, red yeast rice (lovastatin), and she  thinks Zocor and pravastatin  . Ace Inhibitors Cough    Patient refuses to take meds  . Cymbalta [Duloxetine Hcl]     Drowsiness    Current Outpatient Prescriptions on File Prior to Visit  Medication Sig Dispense Refill  . aspirin 81 MG tablet Take 1 tablet (81 mg total) by mouth 2 (two) times daily.      . busPIRone (BUSPAR) 15 MG tablet Take 15 mg by mouth 2 (two) times daily.      . clonazePAM (KLONOPIN) 0.5 MG tablet Take 0.5 mg by mouth 2 (two) times daily.      . cyclobenzaprine (FLEXERIL) 10 MG tablet Take 1 tablet (10 mg total) by mouth at bedtime as needed.  90 tablet  1  . escitalopram (LEXAPRO) 10 MG tablet Take 10 mg by mouth at bedtime.      . fluticasone (FLONASE) 50 MCG/ACT nasal spray Place 2 sprays into both nostrils daily.  16 g  6  . isosorbide mononitrate (IMDUR) 60 MG 24 hr tablet Take 1 tablet (60 mg total) by mouth  daily.  30 tablet  11  . Lurasidone HCl (LATUDA) 20 MG TABS Take 20 mg by mouth at bedtime.      . metFORMIN (GLUCOPHAGE-XR) 500 MG 24 hr tablet TAKE 4 TABLETS DAILY WITH SUPPER  120 tablet  3  . nitroGLYCERIN (NITROSTAT) 0.4 MG SL tablet Place 1 tablet (0.4 mg total) under the tongue every 5 (five) minutes x 3 doses as needed for chest pain.  30 tablet  12  . ONGLYZA 5 MG TABS tablet TAKE 1 TABLET (5 MG TOTAL) BY MOUTH DAILY.  30 tablet  3  . pregabalin (LYRICA) 25 MG capsule 1 capsule twice daily for 2 weeks, then take 2 capsules twice daily  120 capsule  3  . rosuvastatin (CRESTOR) 10 MG tablet Take 1 tablet (10 mg total) by mouth once a week.       No current facility-administered medications on file prior to visit.    BP 130/74  Pulse 95  Temp(Src) 98.6 F (37 C) (Oral)  Wt 148 lb (67.132 kg)  SpO2 97%chart    Objective:   Physical Exam  Constitutional: She is oriented to person, place, and time. She appears well-developed and well-nourished.  Neck: Normal range of motion. Neck supple.  Cardiovascular: Normal rate, regular rhythm and normal heart sounds.   Pulmonary/Chest: Effort normal and breath sounds normal.  Abdominal: Soft. Bowel sounds are normal.  Musculoskeletal: She exhibits tenderness. She exhibits no edema.  Decreased ROM due to pain in the right shoulder. Tender to palpation posteriorly. Pain with abduction. No swelling.   Neurological: She is alert and oriented to person, place, and time.  Skin: Skin is warm and dry.  Psychiatric: She has a normal mood and affect.          Assessment & Plan:   Problem List Items Addressed This Visit   Depression    Other Visit Diagnoses   Right shoulder pain    -  Primary    Relevant Orders       Ambulatory referral to Orthopedic Surgery    Myalgia and myositis         Work note given. Follow-up in 1 month.

## 2014-03-12 ENCOUNTER — Ambulatory Visit: Payer: 59 | Admitting: Neurology

## 2014-03-27 ENCOUNTER — Other Ambulatory Visit: Payer: Self-pay | Admitting: Internal Medicine

## 2014-03-28 ENCOUNTER — Encounter: Payer: Self-pay | Admitting: Family

## 2014-03-28 ENCOUNTER — Ambulatory Visit (INDEPENDENT_AMBULATORY_CARE_PROVIDER_SITE_OTHER): Payer: 59 | Admitting: Family

## 2014-03-28 ENCOUNTER — Ambulatory Visit: Payer: 59 | Admitting: Family

## 2014-03-28 VITALS — BP 118/68 | HR 103 | Wt 145.2 lb

## 2014-03-28 DIAGNOSIS — E119 Type 2 diabetes mellitus without complications: Secondary | ICD-10-CM

## 2014-03-28 DIAGNOSIS — M797 Fibromyalgia: Secondary | ICD-10-CM

## 2014-03-28 DIAGNOSIS — F32A Depression, unspecified: Secondary | ICD-10-CM

## 2014-03-28 DIAGNOSIS — IMO0001 Reserved for inherently not codable concepts without codable children: Secondary | ICD-10-CM

## 2014-03-28 DIAGNOSIS — F3289 Other specified depressive episodes: Secondary | ICD-10-CM

## 2014-03-28 DIAGNOSIS — F329 Major depressive disorder, single episode, unspecified: Secondary | ICD-10-CM

## 2014-03-28 NOTE — Progress Notes (Signed)
Subjective:    Patient ID: [name], female    DOB: 08/30/1956, 58 y.o.   MRN: [id number]  HPI Patient is a 58 yo caucasian female who presents to the office for follow up from fibromyalgia.  Patient reports pain in her right arm and leg. She reports the pain has worsened over the past few weeks.  She reports the pain at its worse is 8-9/10. She reports the pain today is a 8/10. She reports the pain is exacerbated with some ROM.  She reports attempting physical therapy in the past with no success.  She also reports taking lyrica. She reports it alleviates the pain somewhat and was asking about titrating the medication to yield the maximum benefit.    Review of Systems  Constitutional: Positive for activity change.  HENT: Negative.   Respiratory: Negative.  Negative for shortness of breath.   Cardiovascular: Negative for chest pain.  Gastrointestinal: Negative.   Genitourinary: Negative.   Musculoskeletal: Positive for arthralgias, back pain and myalgias. Negative for joint swelling.  Neurological: Negative.   Psychiatric/Behavioral: Negative.   All other systems reviewed and are negative.  Past Medical History  Diagnosis Date  . Diabetes mellitus   . Fibromyalgia   . Coronary artery disease   . Anxiety   . Depression   . Asthma   . Acute MI, inferior wall, initial episode of care 07/08/2012    Cath-07/08/11 -distal RCA stent DES (99%), LAD 20-30%. EF 50% inferior hypokinesis  (infarct)  . Tobacco use disorder 07/08/2012  . Leukocytosis   . Monocytosis   . Peripheral arterial disease 01/23/2013    History   Social History  . Marital Status: Married    Spouse Name: N/A    Number of Children: 3  . Years of Education: college   Occupational History  . [employer]    Social History Main Topics  . Smoking status: Current Every Day Smoker -- 1.50 packs/day for 43 years    Types: Cigarettes  . Smokeless tobacco: Never Used  . Alcohol Use: Yes     Comment:  Rarely  . Drug Use: No  . Sexual Activity: Yes   Other Topics Concern  . Not on file   Social History Narrative   Lives with husband.  They have three grown chlildren.   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   Highest level of education:  College graduate    Past Surgical History  Procedure Laterality Date  . Cardiac catheterization    . Tonsillectomy    . Cyst removal trunk  1977    tailbone  . Coronary stent placement      Family History  Problem Relation Age of Onset  . Kidney disease Mother   . Diabetes Mother     DM Type II  . Alzheimer's disease Mother   . Diabetes Father     IDDM  . Heart disease Father   . Cancer Father     bone cancer died at 5452  . Cancer Sister     bone cancer at age 56  . Multiple sclerosis Daughter   . Diabetes Son 2    DM Type I  . Heart disease Maternal Grandfather   . Heart disease Paternal Grandmother   . Diabetes Son [id number]    DM Type I    Allergies  Allergen Reactions  . Statins     SEVERE MUSCLE PAIN - failed lipitor, Livalo, red yeast rice (lovastatin), and she thinks  Zocor and pravastatin  . Ace Inhibitors Cough    Patient refuses to take meds  . Cymbalta [Duloxetine Hcl]     Drowsiness    Current Outpatient Prescriptions on File Prior to Visit  Medication Sig Dispense Refill  . aspirin 81 MG tablet Take 1 tablet (81 mg total) by mouth 2 (two) times daily.      . busPIRone (BUSPAR) 15 MG tablet Take 15 mg by mouth 2 (two) times daily.      . clonazePAM (KLONOPIN) 0.5 MG tablet Take 0.5 mg by mouth 2 (two) times daily.      . clopidogrel (PLAVIX) 75 MG tablet Take 1 tablet (75 mg total) by mouth daily.  90 tablet  1  . cyclobenzaprine (FLEXERIL) 10 MG tablet Take 1 tablet (10 mg total) by mouth at bedtime as needed.  90 tablet  1  . escitalopram (LEXAPRO) 10 MG tablet Take 10 mg by mouth at bedtime.      . fluticasone (FLONASE) 50 MCG/ACT nasal spray Place 2 sprays into both nostrils daily.  16 g  6  . isosorbide  mononitrate (IMDUR) 60 MG 24 hr tablet Take 1 tablet (60 mg total) by mouth daily.  30 tablet  11  . Lurasidone HCl (LATUDA) 20 MG TABS Take 20 mg by mouth at bedtime.      . metFORMIN (GLUCOPHAGE-XR) 500 MG 24 hr tablet TAKE 4 TABLETS DAILY WITH SUPPER  120 tablet  3  . nitroGLYCERIN (NITROSTAT) 0.4 MG SL tablet Place 1 tablet (0.4 mg total) under the tongue every 5 (five) minutes x 3 doses as needed for chest pain.  30 tablet  12  . ONGLYZA 5 MG TABS tablet TAKE 1 TABLET (5 MG TOTAL) BY MOUTH DAILY.  30 tablet  3  . pregabalin (LYRICA) 25 MG capsule 1 capsule twice daily for 2 weeks, then take 2 capsules twice daily  120 capsule  3  . rosuvastatin (CRESTOR) 10 MG tablet Take 1 tablet (10 mg total) by mouth once a week.       No current facility-administered medications on file prior to visit.    BP 118/68  Pulse 103  Wt 145 lb 3.2 oz (65.862 kg)chart    Objective:   Physical Exam  Constitutional: She is oriented to person, place, and time. Vital signs are normal. She appears well-developed. She is cooperative.  Neck: Normal range of motion. Neck supple.  Cardiovascular: Normal rate, regular rhythm, normal heart sounds and normal pulses.   Pulmonary/Chest: Effort normal and breath sounds normal.  Musculoskeletal: Normal range of motion.       Right shoulder: Normal.       Right hip: Normal.  Passive and active ROM wnl noted.  No edema, redness or warmth noted.  Neurological: She is alert and oriented to person, place, and time.  Skin: Skin is warm and dry.  Psychiatric: She has a normal mood and affect.          Assessment & Plan:  [name] was seen today for follow-up.  Diagnoses and associated orders for this visit:  Fibromyalgia  Depression  Type II or unspecified type diabetes mellitus without mention of complication, not stated as uncontrolled   Call the office with any questions or concerns. Recheck in 6 weeks and sooner as needed

## 2014-03-28 NOTE — Progress Notes (Signed)
Pre visit review using our clinic review tool, if applicable. No additional management support is needed unless otherwise documented below in the visit note. 

## 2014-03-28 NOTE — Patient Instructions (Signed)
Fibromyalgia Fibromyalgia is a disorder that is often misunderstood. It is associated with muscular pains and tenderness that comes and goes. It is often associated with fatigue and sleep disturbances. Though it tends to be long-lasting, fibromyalgia is not life-threatening. CAUSES  The exact cause of fibromyalgia is unknown. People with certain gene types are predisposed to developing fibromyalgia and other conditions. Certain factors can play a role as triggers, such as:  Spine disorders.  Arthritis.  Severe injury (trauma) and other physical stressors.  Emotional stressors. SYMPTOMS   The main symptom is pain and stiffness in the muscles and joints, which can vary over time.  Sleep and fatigue problems. Other related symptoms may include:  Bowel and bladder problems.  Headaches.  Visual problems.  Problems with odors and noises.  Depression or mood changes.  Painful periods (dysmenorrhea).  Dryness of the skin or eyes. DIAGNOSIS  There are no specific tests for diagnosing fibromyalgia. Patients can be diagnosed accurately from the specific symptoms they have. The diagnosis is made by determining that nothing else is causing the problems. TREATMENT  There is no cure. Management includes medicines and an active, healthy lifestyle. The goal is to enhance physical fitness, decrease pain, and improve sleep. HOME CARE INSTRUCTIONS   Only take over-the-counter or prescription medicines as directed by your caregiver. Sleeping pills, tranquilizers, and pain medicines may make your problems worse.  Low-impact aerobic exercise is very important and advised for treatment. At first, it may seem to make pain worse. Gradually increasing your tolerance will overcome this feeling.  Learning relaxation techniques and how to control stress will help you. Biofeedback, visual imagery, hypnosis, muscle relaxation, yoga, and meditation are all options.  Anti-inflammatory medicines and  physical therapy may provide short-term help.  Acupuncture or massage treatments may help.  Take muscle relaxant medicines as suggested by your caregiver.  Avoid stressful situations.  Plan a healthy lifestyle. This includes your diet, sleep, rest, exercise, and friends.  Find and practice a hobby you enjoy.  Join a fibromyalgia support group for interaction, ideas, and sharing advice. This may be helpful. SEEK MEDICAL CARE IF:  You are not having good results or improvement from your treatment. FOR MORE INFORMATION  National Fibromyalgia Association: www.fmaware.org Arthritis Foundation: www.arthritis.org Document Released: 09/13/2005 Document Revised: 12/06/2011 Document Reviewed: 12/24/2009 ExitCare Patient Information 2015 [employer]. This information is not intended to replace advice given to you by your health care provider. Make sure you discuss any questions you have with your health care provider.  

## 2014-04-01 ENCOUNTER — Ambulatory Visit: Payer: 59 | Admitting: Family

## 2014-04-11 ENCOUNTER — Telehealth: Payer: Self-pay | Admitting: Cardiology

## 2014-04-11 NOTE — Telephone Encounter (Signed)
Returned call to patient she stated she received disability form back and Dr.Hochrein still needs to complete a couple of questions.Stated she will bring form by office tomorrow 04/12/14 to be completed.Message sent to Dr.Hochrein's nurse JC.

## 2014-04-11 NOTE — Telephone Encounter (Signed)
Please call,concerning disabilty forms that was filled out..There are some spaces that still need to be filled out.

## 2014-04-15 NOTE — Telephone Encounter (Signed)
Waiting for pt. To bring back

## 2014-04-17 ENCOUNTER — Telehealth: Payer: Self-pay | Admitting: Cardiology

## 2014-04-18 NOTE — Telephone Encounter (Signed)
Walk in patient form completed: patient is returning disability forms previously completed by Dr. Antoine PocheHochrein. When received, sections were incomplete.   Please have Dr. Antoine PocheHochrein complete the missed sections and return to [facility] in medical records:djc

## 2014-05-01 ENCOUNTER — Ambulatory Visit: Payer: 59 | Admitting: Neurology

## 2014-05-22 ENCOUNTER — Ambulatory Visit (INDEPENDENT_AMBULATORY_CARE_PROVIDER_SITE_OTHER): Payer: 59 | Admitting: Family

## 2014-05-22 ENCOUNTER — Encounter: Payer: Self-pay | Admitting: Family

## 2014-05-22 VITALS — BP 130/80 | HR 93 | Wt 150.0 lb

## 2014-05-22 DIAGNOSIS — F3289 Other specified depressive episodes: Secondary | ICD-10-CM

## 2014-05-22 DIAGNOSIS — K219 Gastro-esophageal reflux disease without esophagitis: Secondary | ICD-10-CM

## 2014-05-22 DIAGNOSIS — M797 Fibromyalgia: Secondary | ICD-10-CM

## 2014-05-22 DIAGNOSIS — F32A Depression, unspecified: Secondary | ICD-10-CM

## 2014-05-22 DIAGNOSIS — R7989 Other specified abnormal findings of blood chemistry: Secondary | ICD-10-CM

## 2014-05-22 DIAGNOSIS — E7800 Pure hypercholesterolemia, unspecified: Secondary | ICD-10-CM

## 2014-05-22 DIAGNOSIS — E1165 Type 2 diabetes mellitus with hyperglycemia: Secondary | ICD-10-CM

## 2014-05-22 DIAGNOSIS — IMO0001 Reserved for inherently not codable concepts without codable children: Secondary | ICD-10-CM

## 2014-05-22 DIAGNOSIS — F329 Major depressive disorder, single episode, unspecified: Secondary | ICD-10-CM

## 2014-05-22 LAB — BASIC METABOLIC PANEL
BUN: 15 mg/dL (ref 6–23)
CALCIUM: 9.5 mg/dL (ref 8.4–10.5)
CO2: 26 meq/L (ref 19–32)
CREATININE: 0.8 mg/dL (ref 0.4–1.2)
Chloride: 103 mEq/L (ref 96–112)
GFR: 78.79 mL/min (ref >60.00)
Glucose, Bld: 154 mg/dL — ABNORMAL HIGH (ref 70–99)
Potassium: 4.7 mEq/L (ref 3.5–5.1)
SODIUM: 139 meq/L (ref 135–145)

## 2014-05-22 LAB — HEPATIC FUNCTION PANEL
ALT: 13 U/L (ref 0–35)
AST: 21 U/L (ref 0–37)
Albumin: 4.1 g/dL (ref 3.5–5.2)
Alkaline Phosphatase: 71 U/L (ref 39–117)
BILIRUBIN DIRECT: 0.1 mg/dL (ref 0.0–0.3)
TOTAL PROTEIN: 7.4 g/dL (ref 6.0–8.3)
Total Bilirubin: 0.6 mg/dL (ref 0.2–1.2)

## 2014-05-22 LAB — CBC WITH DIFFERENTIAL/PLATELET
BASOS PCT: 0.2 % (ref 0.0–3.0)
Basophils Absolute: 0 10*3/uL (ref 0.0–0.1)
EOS ABS: 0.6 10*3/uL (ref 0.0–0.7)
Eosinophils Relative: 4 % (ref 0.0–5.0)
HCT: 46.9 % — ABNORMAL HIGH (ref 36.0–46.0)
HEMOGLOBIN: 15.6 g/dL — AB (ref 12.0–15.0)
LYMPHS ABS: 2.7 10*3/uL (ref 0.7–4.0)
LYMPHS PCT: 17 % (ref 12.0–46.0)
MCHC: 33.2 g/dL (ref 30.0–36.0)
MCV: 91.6 fl (ref 78.0–100.0)
MONOS PCT: 5.9 % (ref 3.0–12.0)
Monocytes Absolute: 1 10*3/uL (ref 0.1–1.0)
NEUTROS ABS: 11.8 10*3/uL — AB (ref 1.4–7.7)
Neutrophils Relative %: 72.9 % (ref 43.0–77.0)
Platelets: 271 10*3/uL (ref 150.0–400.0)
RBC: 5.12 Mil/uL — AB (ref 3.87–5.11)
RDW: 13.8 % (ref 11.5–15.5)
WBC: 16.1 10*3/uL — ABNORMAL HIGH (ref 4.0–10.5)

## 2014-05-22 LAB — LIPID PANEL
CHOL/HDL RATIO: 5
Cholesterol: 196 mg/dL (ref 0–200)
HDL: 39.2 mg/dL (ref >39.00)
NONHDL: 156.8
Triglycerides: 294 mg/dL — ABNORMAL HIGH (ref 0.0–149.0)
VLDL: 58.8 mg/dL — AB (ref 0.0–40.0)

## 2014-05-22 LAB — LDL CHOLESTEROL, DIRECT: Direct LDL: 131.1 mg/dL

## 2014-05-22 LAB — HEMOGLOBIN A1C: Hgb A1c MFr Bld: 7.2 % — ABNORMAL HIGH (ref 4.6–6.5)

## 2014-05-22 NOTE — Progress Notes (Signed)
Pre visit review using our clinic review tool, if applicable. No additional management support is needed unless otherwise documented below in the visit note. 

## 2014-05-22 NOTE — Progress Notes (Signed)
Subjective:    Patient ID: [name], female    DOB: 1956-06-22, 58 y.o.   MRN: [id number]  HPI 58 year old white female, smoker with a history of cardiovascular disease, coronary artery disease, fibromyalgia with multiple joint pain, anxiety, depression, hyperlipidemia is in today for recheck. She has not seen cardiology since her last visit. Still reports an inability to work. Reports being unable to sit for greater than 15 minutes nor stand for greater than 15 minutes at a time. Reports pain in her shoulders, hips and back. Has not had physical therapy since prior to her myocardial infarction. Reports depression being stable on current medication regimen. Has concerns of heartburn and indigestion that is present over the last 6 weeks and worsened. His awakening her from sleep. Has not tried any medication for relief.   Review of Systems  Constitutional: Negative.   HENT: Negative.   Respiratory: Negative.   Cardiovascular: Negative.   Gastrointestinal: Negative.   Endocrine: Negative.   Genitourinary: Negative.   Musculoskeletal: Negative.   Skin: Negative.   Allergic/Immunologic: Negative.   Neurological: Negative.   Hematological: Negative.   Psychiatric/Behavioral: Negative.    Past Medical History  Diagnosis Date  . Diabetes mellitus   . Fibromyalgia   . Coronary artery disease   . Anxiety   . Depression   . Asthma   . Acute MI, inferior wall, initial episode of care 07/08/2012    Cath-07/08/11 -distal RCA stent DES (99%), LAD 20-30%. EF 50% inferior hypokinesis  (infarct)  . Tobacco use disorder 07/08/2012  . Leukocytosis   . Monocytosis   . Peripheral arterial disease 01/23/2013    History   Social History  . Marital Status: Married    Spouse Name: N/A    Number of Children: 3  . Years of Education: college   Occupational History  . [employer]    Social History Main Topics  . Smoking status: Current Every Day Smoker -- 1.50 packs/day for 43  years    Types: Cigarettes  . Smokeless tobacco: Never Used  . Alcohol Use: Yes     Comment: Rarely  . Drug Use: No  . Sexual Activity: Yes   Other Topics Concern  . Not on file   Social History Narrative   Lives with husband.  They have three grown chlildren.   She is working as a Radiographer, therapeutic.   Highest level of education:  College graduate    Past Surgical History  Procedure Laterality Date  . Cardiac catheterization    . Tonsillectomy    . Cyst removal trunk  1977    tailbone  . Coronary stent placement      Family History  Problem Relation Age of Onset  . Kidney disease Mother   . Diabetes Mother     DM Type II  . Alzheimer's disease Mother   . Diabetes Father     IDDM  . Heart disease Father   . Cancer Father     bone cancer died at 57  . Cancer Sister     bone cancer at age 73  . Multiple sclerosis Daughter   . Diabetes Son 2    DM Type I  . Heart disease Maternal Grandfather   . Heart disease Paternal Grandmother   . Diabetes Son 71    DM Type I    Allergies  Allergen Reactions  . Statins     SEVERE MUSCLE PAIN - failed lipitor, Livalo, red yeast rice (lovastatin),  and she thinks Zocor and pravastatin  . Ace Inhibitors Cough    Patient refuses to take meds  . Cymbalta [Duloxetine Hcl]     Drowsiness    Current Outpatient Prescriptions on File Prior to Visit  Medication Sig Dispense Refill  . aspirin 81 MG tablet Take 1 tablet (81 mg total) by mouth 2 (two) times daily.      . busPIRone (BUSPAR) 15 MG tablet Take 15 mg by mouth 2 (two) times daily.      . clonazePAM (KLONOPIN) 0.5 MG tablet Take 0.5 mg by mouth 2 (two) times daily.      . clopidogrel (PLAVIX) 75 MG tablet Take 1 tablet (75 mg total) by mouth daily.  90 tablet  1  . cyclobenzaprine (FLEXERIL) 10 MG tablet Take 1 tablet (10 mg total) by mouth at bedtime as needed.  90 tablet  1  . escitalopram (LEXAPRO) 10 MG tablet Take 5 mg by mouth at bedtime.       . fluticasone  (FLONASE) 50 MCG/ACT nasal spray Place 2 sprays into both nostrils daily.  16 g  6  . isosorbide mononitrate (IMDUR) 60 MG 24 hr tablet Take 1 tablet (60 mg total) by mouth daily.  30 tablet  11  . Lurasidone HCl (LATUDA) 20 MG TABS Take 20 mg by mouth at bedtime.      . metFORMIN (GLUCOPHAGE-XR) 500 MG 24 hr tablet TAKE 4 TABLETS DAILY WITH SUPPER  120 tablet  3  . nitroGLYCERIN (NITROSTAT) 0.4 MG SL tablet Place 1 tablet (0.4 mg total) under the tongue every 5 (five) minutes x 3 doses as needed for chest pain.  30 tablet  12  . ONGLYZA 5 MG TABS tablet TAKE 1 TABLET (5 MG TOTAL) BY MOUTH DAILY.  30 tablet  3  . pregabalin (LYRICA) 25 MG capsule 1 capsule twice daily for 2 weeks, then take 2 capsules twice daily  120 capsule  3  . rosuvastatin (CRESTOR) 10 MG tablet Take 1 tablet (10 mg total) by mouth once a week.       No current facility-administered medications on file prior to visit.    BP 130/80  Pulse 93  Wt 150 lb (68.04 kg)  SpO2 97%chart    Objective:   Physical Exam  Constitutional: She is oriented to person, place, and time. She appears well-developed and well-nourished.  HENT:  Right Ear: External ear normal.  Left Ear: External ear normal.  Nose: Nose normal.  Mouth/Throat: Oropharynx is clear and moist.  Neck: Normal range of motion. Neck supple.  Cardiovascular: Normal rate, regular rhythm and normal heart sounds.   Pulmonary/Chest: Effort normal and breath sounds normal.  Abdominal: Soft. Bowel sounds are normal.  Musculoskeletal: Normal range of motion.  Neurological: She is alert and oriented to person, place, and time.  Skin: Skin is warm and dry.  Psychiatric: She has a normal mood and affect.          Assessment & Plan:  [name] was seen today for follow-up.  Diagnoses and associated orders for this visit:  Fibromyalgia - CBC with Differential  Type II or unspecified type diabetes mellitus without mention of complication, uncontrolled - Hemoglobin  A1c - Basic Metabolic Panel - Hepatic Function Panel  Pure hypercholesterolemia - Lipid Panel - Basic Metabolic Panel - Hepatic Function Panel  Depression  Gastroesophageal reflux disease without esophagitis - CBC with Differential     Patient reports she is still physically unable to work. Is unable  to afford physical therapy at this point. Continue disability. Recheck in 3 months and sooner as needed.

## 2014-05-22 NOTE — Patient Instructions (Signed)

## 2014-05-23 ENCOUNTER — Other Ambulatory Visit: Payer: Self-pay | Admitting: Family

## 2014-06-04 ENCOUNTER — Other Ambulatory Visit: Payer: 59

## 2014-06-06 ENCOUNTER — Ambulatory Visit: Payer: 59 | Admitting: Pharmacist

## 2014-06-11 ENCOUNTER — Encounter: Payer: Self-pay | Admitting: Family

## 2014-06-24 ENCOUNTER — Telehealth: Payer: Self-pay | Admitting: Family

## 2014-06-24 NOTE — Telephone Encounter (Signed)
Pt needs an appt by 06-26-14 due to no longer having insurance. Can I create 30 min slot. Pt needs to fup on med

## 2014-06-25 ENCOUNTER — Other Ambulatory Visit: Payer: Self-pay

## 2014-06-25 MED ORDER — PREGABALIN 50 MG PO CAPS: 50.0000 mg | ORAL_CAPSULE | Freq: Two times a day (BID) | ORAL | Status: DC

## 2014-06-25 MED ORDER — PREGABALIN 25 MG PO CAPS: 25.0000 mg | ORAL_CAPSULE | Freq: Two times a day (BID) | ORAL | Status: DC

## 2014-06-25 NOTE — Telephone Encounter (Signed)
Per Oran ReinPadonda, send pt a MyChart message to discuss medication questions

## 2014-07-02 ENCOUNTER — Ambulatory Visit: Payer: 59 | Admitting: Family

## 2014-08-13 ENCOUNTER — Telehealth: Payer: Self-pay | Admitting: *Deleted

## 2014-08-14 NOTE — Telephone Encounter (Signed)
Entered in error

## 2014-09-05 ENCOUNTER — Encounter (HOSPITAL_COMMUNITY): Payer: Self-pay | Admitting: Cardiovascular Disease

## 2014-09-24 ENCOUNTER — Encounter: Payer: Self-pay | Admitting: Family

## 2014-09-24 ENCOUNTER — Ambulatory Visit (INDEPENDENT_AMBULATORY_CARE_PROVIDER_SITE_OTHER): Payer: Self-pay | Admitting: Family

## 2014-09-24 VITALS — BP 132/82 | HR 95 | Temp 98.1°F | Wt 149.0 lb

## 2014-09-24 DIAGNOSIS — IMO0002 Reserved for concepts with insufficient information to code with codable children: Secondary | ICD-10-CM

## 2014-09-24 DIAGNOSIS — E785 Hyperlipidemia, unspecified: Secondary | ICD-10-CM

## 2014-09-24 DIAGNOSIS — E1165 Type 2 diabetes mellitus with hyperglycemia: Secondary | ICD-10-CM

## 2014-09-24 DIAGNOSIS — I10 Essential (primary) hypertension: Secondary | ICD-10-CM

## 2014-09-24 LAB — HEPATIC FUNCTION PANEL
ALT: 21 U/L (ref 0–35)
AST: 19 U/L (ref 0–37)
Albumin: 4.3 g/dL (ref 3.5–5.2)
Alkaline Phosphatase: 73 U/L (ref 39–117)
BILIRUBIN DIRECT: 0 mg/dL (ref 0.0–0.3)
Total Bilirubin: 0.4 mg/dL (ref 0.2–1.2)
Total Protein: 7.6 g/dL (ref 6.0–8.3)

## 2014-09-24 LAB — BASIC METABOLIC PANEL
BUN: 13 mg/dL (ref 6–23)
CALCIUM: 9.5 mg/dL (ref 8.4–10.5)
CHLORIDE: 107 meq/L (ref 96–112)
CO2: 25 mEq/L (ref 19–32)
Creatinine, Ser: 0.7 mg/dL (ref 0.4–1.2)
GFR: 100.01 mL/min (ref >60.00)
Glucose, Bld: 128 mg/dL — ABNORMAL HIGH (ref 70–99)
Potassium: 4.2 mEq/L (ref 3.5–5.1)
Sodium: 141 mEq/L (ref 135–145)

## 2014-09-24 LAB — HEMOGLOBIN A1C: Hgb A1c MFr Bld: 7.6 % — ABNORMAL HIGH (ref 4.6–6.5)

## 2014-09-24 MED ORDER — GLIPIZIDE 5 MG PO TABS: 5.0000 mg | ORAL_TABLET | Freq: Two times a day (BID) | ORAL | Status: DC

## 2014-09-24 MED ORDER — CLOPIDOGREL BISULFATE 75 MG PO TABS
75.0000 mg | ORAL_TABLET | Freq: Every day | ORAL | Status: DC
Start: 2014-09-24 — End: 2015-03-06

## 2014-09-24 MED ORDER — CYCLOBENZAPRINE HCL 10 MG PO TABS: 10.0000 mg | ORAL_TABLET | Freq: Every evening | ORAL | Status: DC | PRN

## 2014-09-24 MED ORDER — GABAPENTIN 300 MG PO CAPS: 300.0000 mg | ORAL_CAPSULE | Freq: Three times a day (TID) | ORAL | Status: DC

## 2014-09-24 MED ORDER — METFORMIN HCL 1000 MG PO TABS: 1000.0000 mg | ORAL_TABLET | Freq: Two times a day (BID) | ORAL | Status: DC

## 2014-09-24 MED ORDER — ISOSORBIDE MONONITRATE ER 60 MG PO TB24: 60.0000 mg | ORAL_TABLET | Freq: Every day | ORAL | Status: DC

## 2014-09-24 NOTE — Progress Notes (Signed)
Pre visit review using our clinic review tool, if applicable. No additional management support is needed unless otherwise documented below in the visit note. 

## 2014-09-24 NOTE — Patient Instructions (Signed)
Diabetes and Foot Care Diabetes may cause you to have problems because of poor blood supply (circulation) to your feet and legs. This may cause the skin on your feet to become thinner, break easier, and heal more slowly. Your skin may become dry, and the skin may peel and crack. You may also have nerve damage in your legs and feet causing decreased feeling in them. You may not notice minor injuries to your feet that could lead to infections or more serious problems. Taking care of your feet is one of the most important things you can do for yourself.  HOME CARE INSTRUCTIONS  Wear shoes at all times, even in the house. Do not go barefoot. Bare feet are easily injured.  Check your feet daily for blisters, cuts, and redness. If you cannot see the bottom of your feet, use a mirror or ask someone for help.  Wash your feet with warm water (do not use hot water) and mild soap. Then pat your feet and the areas between your toes until they are completely dry. Do not soak your feet as this can dry your skin.  Apply a moisturizing lotion or petroleum jelly (that does not contain alcohol and is unscented) to the skin on your feet and to dry, brittle toenails. Do not apply lotion between your toes.  Trim your toenails straight across. Do not dig under them or around the cuticle. File the edges of your nails with an emery board or nail file.  Do not cut corns or calluses or try to remove them with medicine.  Wear clean socks or stockings every day. Make sure they are not too tight. Do not wear knee-high stockings since they may decrease blood flow to your legs.  Wear shoes that fit properly and have enough cushioning. To break in new shoes, wear them for just a few hours a day. This prevents you from injuring your feet. Always look in your shoes before you put them on to be sure there are no objects inside.  Do not cross your legs. This may decrease the blood flow to your feet.  If you find a minor scrape,  cut, or break in the skin on your feet, keep it and the skin around it clean and dry. These areas may be cleansed with mild soap and water. Do not cleanse the area with peroxide, alcohol, or iodine.  When you remove an adhesive bandage, be sure not to damage the skin around it.  If you have a wound, look at it several times a day to make sure it is healing.  Do not use heating pads or hot water bottles. They may burn your skin. If you have lost feeling in your feet or legs, you may not know it is happening until it is too late.  Make sure your health care provider performs a complete foot exam at least annually or more often if you have foot problems. Report any cuts, sores, or bruises to your health care provider immediately. SEEK MEDICAL CARE IF:   You have an injury that is not healing.  You have cuts or breaks in the skin.  You have an ingrown nail.  You notice redness on your legs or feet.  You feel burning or tingling in your legs or feet.  You have pain or cramps in your legs and feet.  Your legs or feet are numb.  Your feet always feel cold. SEEK IMMEDIATE MEDICAL CARE IF:   There is increasing redness,   swelling, or pain in or around a wound.  There is a red line that goes up your leg.  Pus is coming from a wound.  You develop a fever or as directed by your health care provider.  You notice a bad smell coming from an ulcer or wound. Document Released: 09/10/2000 Document Revised: 05/16/2013 Document Reviewed: 02/20/2013 ExitCare Patient Information 2015 [employer]. This information is not intended to replace advice given to you by your health care provider. Make sure you discuss any questions you have with your health care provider.  

## 2014-09-24 NOTE — Progress Notes (Signed)
Subjective:    Patient ID: [name], female    DOB: 08/06/1956, 58 y.o.   MRN: [id number]  HPI 58 year old white female, smoker with a history of hyperlipidemia, type 2 diabetes, tobacco abuse, depression is in today for recheck. She has been off of Onglyza due to cost. She no longer has health care insurance. She has only been taking metformin 1000 mg twice a day. Is asking for a cheaper alternative to Onglyza. Reports glucose readings close to 200 recently. Last hemoglobin A1c was 7.1   Review of Systems  Constitutional: Negative.   HENT: Negative.   Respiratory: Negative.   Cardiovascular: Negative.   Gastrointestinal: Negative.   Endocrine: Negative.  Negative for polydipsia, polyphagia and polyuria.  Genitourinary: Positive for genital sores.  Skin: Negative.   Allergic/Immunologic: Negative.   Neurological: Negative.   Hematological: Negative.   Psychiatric/Behavioral: Positive for dysphoric mood.   Past Medical History  Diagnosis Date  . Diabetes mellitus   . Fibromyalgia   . Coronary artery disease   . Anxiety   . Depression   . Asthma   . Acute MI, inferior wall, initial episode of care 07/08/2012    Cath-07/08/11 -distal RCA stent DES (99%), LAD 20-30%. EF 50% inferior hypokinesis  (infarct)  . Tobacco use disorder 07/08/2012  . Leukocytosis   . Monocytosis   . Peripheral arterial disease 01/23/2013    History   Social History  . Marital Status: Married    Spouse Name: N/A    Number of Children: 3  . Years of Education: college   Occupational History  . [employer]    Social History Main Topics  . Smoking status: Current Every Day Smoker -- 1.50 packs/day for 43 years    Types: Cigarettes  . Smokeless tobacco: Never Used  . Alcohol Use: Yes     Comment: Rarely  . Drug Use: No  . Sexual Activity: Yes   Other Topics Concern  . Not on file   Social History Narrative   Lives with husband.  They have three grown chlildren.   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   Highest level of education:  College graduate    Past Surgical History  Procedure Laterality Date  . Cardiac catheterization    . Tonsillectomy    . Cyst removal trunk  1977    tailbone  . Coronary stent placement    . Left heart catheterization with coronary angiogram N/A 07/07/2012    Procedure: LEFT HEART CATHETERIZATION WITH CORONARY ANGIOGRAM;  Surgeon: Kathleene Hazelhristopher D McAlhany, MD;  [facility];  Service: Cardiovascular;  Laterality: N/A;  . Percutaneous coronary stent intervention (pci-s)  07/07/2012    Procedure: PERCUTANEOUS CORONARY STENT INTERVENTION (PCI-S);  Surgeon: Kathleene Hazelhristopher D McAlhany, MD;  [facility];  Service: Cardiovascular;;  . Abdominal aortagram N/A 02/07/2013    Procedure: ABDOMINAL Ronny FlurryAORTAGRAM;  Surgeon: Iran OuchMuhammad A Arida, MD;  [facility];  Service: Cardiovascular;  Laterality: N/A;    Family History  Problem Relation Age of Onset  . Kidney disease Mother   . Diabetes Mother     DM Type II  . Alzheimer's disease Mother   . Diabetes Father     IDDM  . Heart disease Father   . Cancer Father     bone cancer died at 2252  . Cancer Sister     bone cancer at age 460  . Multiple sclerosis Daughter   . Diabetes Son  2    DM Type I  . Heart disease Maternal Grandfather   . Heart disease Paternal Grandmother   . Diabetes Son [id number]    DM Type I    Allergies  Allergen Reactions  . Statins     SEVERE MUSCLE PAIN - failed lipitor, Livalo, red yeast rice (lovastatin), and she thinks Zocor and pravastatin  . Ace Inhibitors Cough    Patient refuses to take meds  . Cymbalta [Duloxetine Hcl]     Drowsiness    Current Outpatient Prescriptions on File Prior to Visit  Medication Sig Dispense Refill  . aspirin 81 MG tablet Take 1 tablet (81 mg total) by mouth 2 (two) times daily.    . busPIRone (BUSPAR) 15 MG tablet Take 15 mg by mouth 2 (two) times daily.    . clonazePAM (KLONOPIN) 0.5 MG tablet Take  0.5 mg by mouth 2 (two) times daily.    . nitroGLYCERIN (NITROSTAT) 0.4 MG SL tablet Place 1 tablet (0.4 mg total) under the tongue every 5 (five) minutes x 3 doses as needed for chest pain. 30 tablet 12   No current facility-administered medications on file prior to visit.    BP 132/82 mmHg  Pulse 95  Temp(Src) 98.1 F (36.7 C) (Oral)  Wt 149 lb (67.586 kg)chart    Objective:   Physical Exam  Constitutional: She is oriented to person, place, and time. She appears well-developed and well-nourished.  HENT:  Right Ear: External ear normal.  Left Ear: External ear normal.  Nose: Nose normal.  Mouth/Throat: Oropharynx is clear and moist.  Neck: Normal range of motion. Neck supple.  Cardiovascular: Normal rate, regular rhythm and normal heart sounds.   Pulmonary/Chest: Effort normal and breath sounds normal.  Abdominal: Soft. Bowel sounds are normal.  Musculoskeletal: Normal range of motion.  Neurological: She is alert and oriented to person, place, and time.  Skin: Skin is warm and dry.  Psychiatric: She has a normal mood and affect.          Assessment & Plan:  [name] was seen today for diabetes.  Diagnoses and associated orders for this visit:  Type 2 diabetes mellitus, uncontrolled - Hemoglobin A1c  Hyperlipidemia - Basic Metabolic Panel - Hepatic Function Panel  Essential hypertension - Basic Metabolic Panel - Hepatic Function Panel  Other Orders - glipiZIDE (GLUCOTROL) 5 MG tablet; Take 1 tablet (5 mg total) by mouth 2 (two) times daily before a meal. - metFORMIN (GLUCOPHAGE) 1000 MG tablet; Take 1 tablet (1,000 mg total) by mouth 2 (two) times daily with a meal. - gabapentin (NEURONTIN) 300 MG capsule; Take 1 capsule (300 mg total) by mouth 3 (three) times daily. - cyclobenzaprine (FLEXERIL) 10 MG tablet; Take 1 tablet (10 mg total) by mouth at bedtime as needed. - isosorbide mononitrate (IMDUR) 60 MG 24 hr tablet; Take 1 tablet (60 mg total) by mouth  daily. - clopidogrel (PLAVIX) 75 MG tablet; Take 1 tablet (75 mg total) by mouth daily.    call the office with any questions or concerns. Recheck in 3 weeks to be sure she's doing well on glipizide. Call the office with any questions or concerns prior to that appointment.

## 2015-01-07 IMAGING — CR DG ORBITS FOR FOREIGN BODY
2 series · 2 of 2 positions shown · non-contrast
Comparison: none

CLINICAL DATA: Previous metal exposure

EXAM:
ORBITS FOR FOREIGN BODY - 2 VIEW

[view not recorded (1 of 2)]
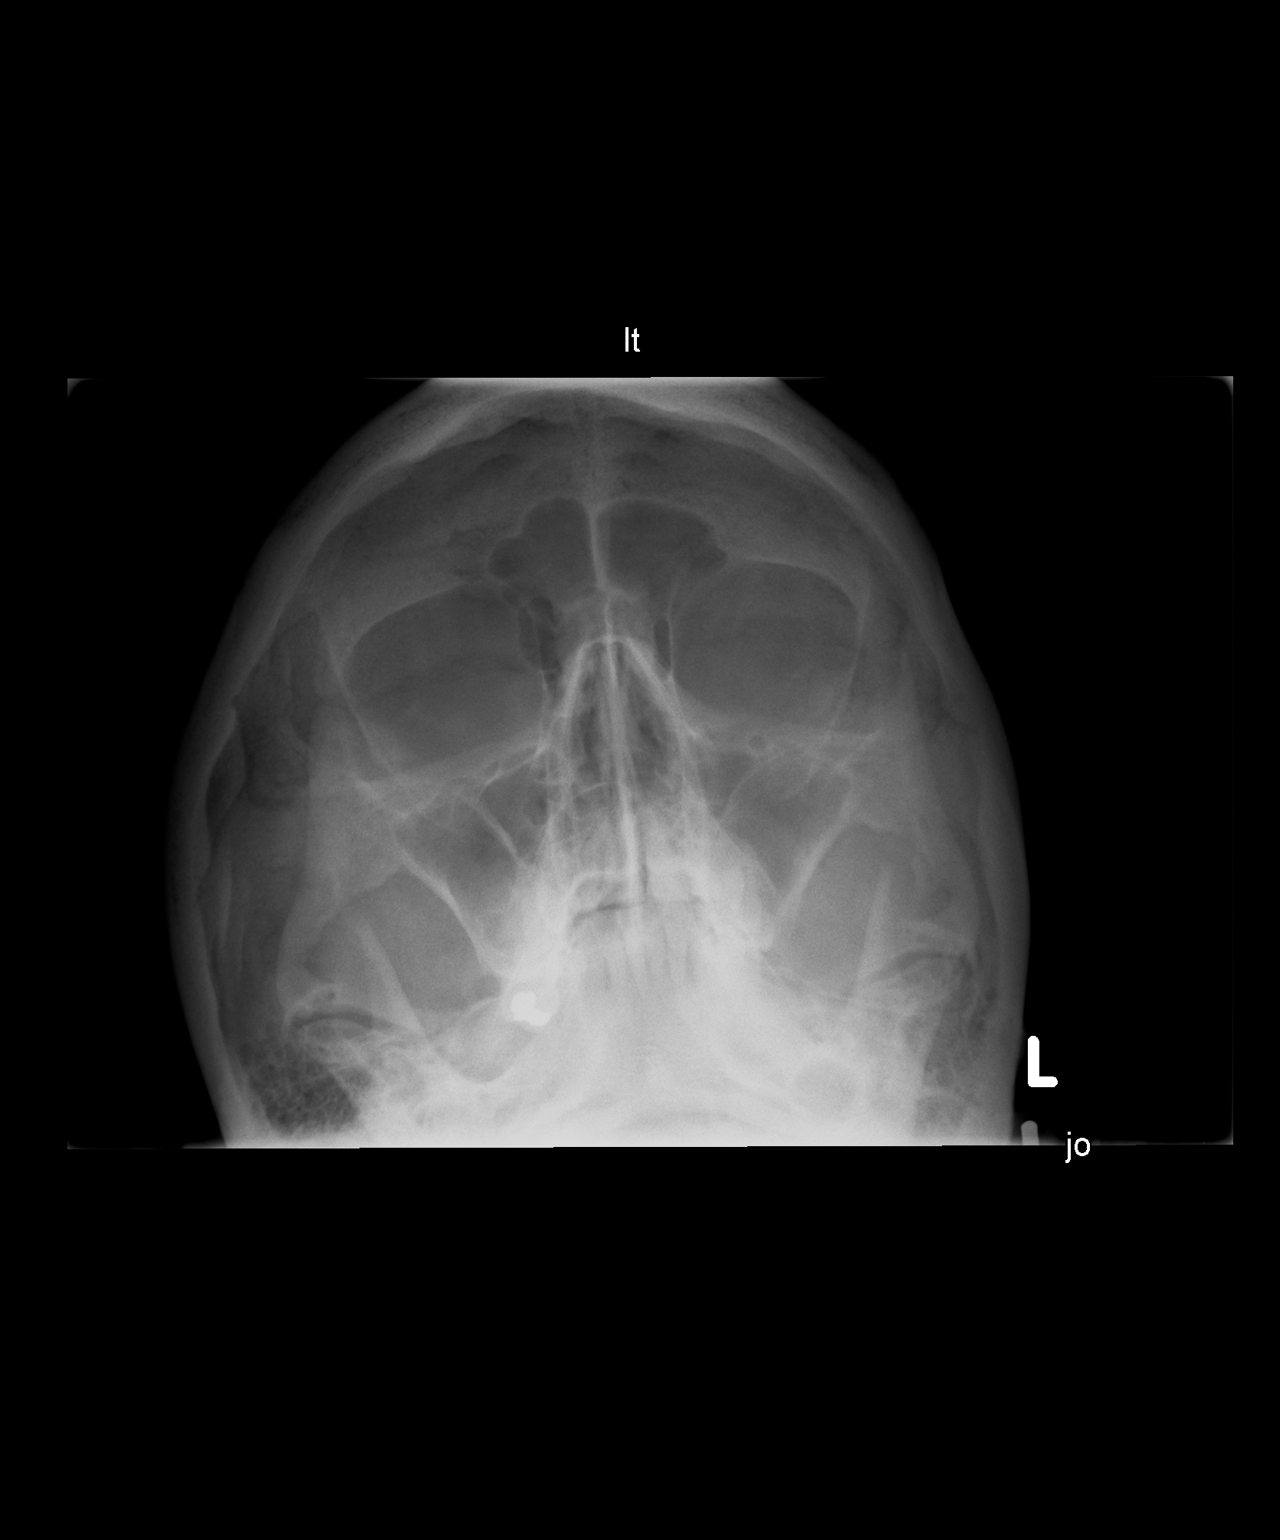

[view not recorded (2 of 2)]
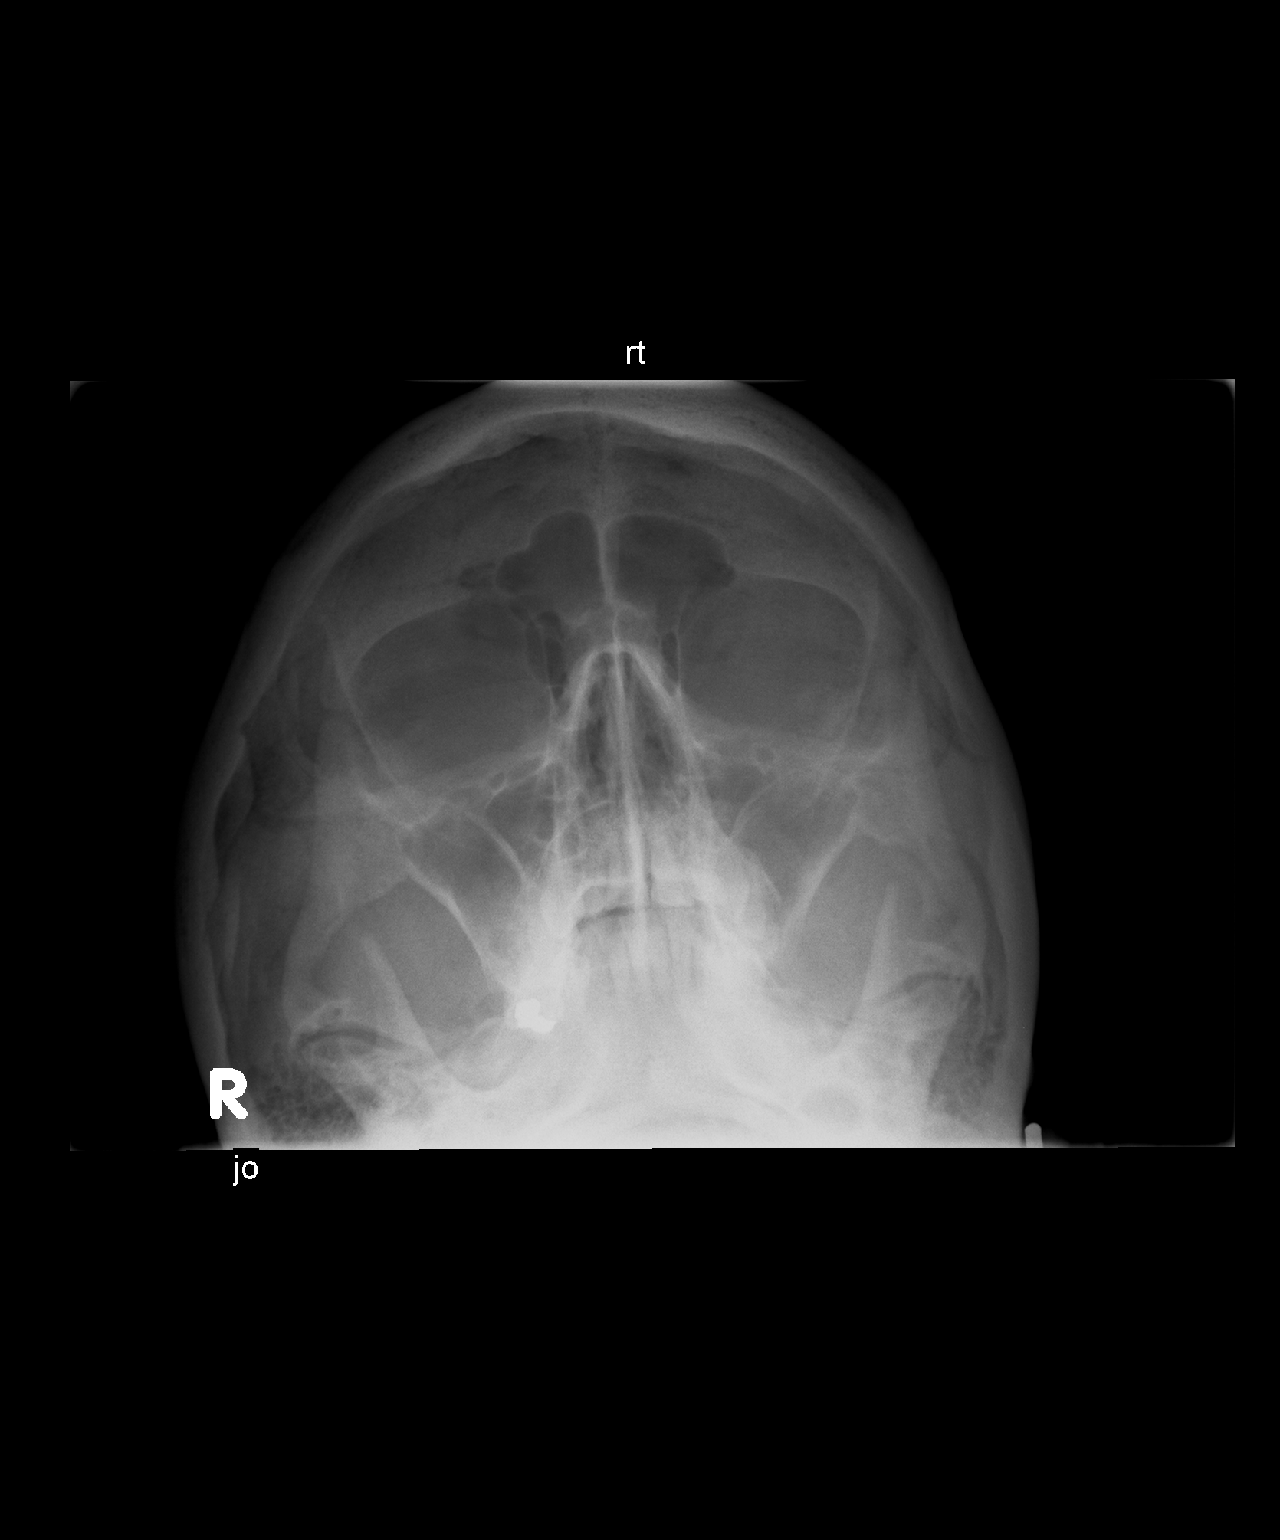

[2 of 2 positions shown; findings below may reference images not displayed]

FINDINGS: There is no evidence of metallic foreign body within the orbits. No
significant bone abnormality identified. Right dental restoration.
IMPRESSION: No evidence of metallic foreign body within the orbits.

## 2015-01-22 ENCOUNTER — Telehealth: Payer: Self-pay | Admitting: Family

## 2015-01-22 NOTE — Telephone Encounter (Signed)
[facility] and is requesting re-fills on following:  gabapentin (NEURONTIN) 300 MG capsule mirtazapine (REMERON) 7.5 MG tablet metFORMIN (GLUCOPHAGE) 1000 MG tablet glipiZIDE (GLUCOTROL) 5 MG tablet cyclobenzaprine (FLEXERIL) 10 MG tablet  [facility] [address].

## 2015-01-23 MED ORDER — MIRTAZAPINE 7.5 MG PO TABS
7.5000 mg | ORAL_TABLET | Freq: Every day | ORAL | Status: DC
Start: 2015-01-23 — End: 2015-02-12

## 2015-01-23 MED ORDER — GABAPENTIN 300 MG PO CAPS: 300.0000 mg | ORAL_CAPSULE | Freq: Three times a day (TID) | ORAL | Status: DC

## 2015-01-23 MED ORDER — METFORMIN HCL 1000 MG PO TABS: 1000.0000 mg | ORAL_TABLET | Freq: Two times a day (BID) | ORAL | Status: DC

## 2015-01-23 MED ORDER — CYCLOBENZAPRINE HCL 10 MG PO TABS: 10.0000 mg | ORAL_TABLET | Freq: Every evening | ORAL | Status: DC | PRN

## 2015-01-23 MED ORDER — GLIPIZIDE 5 MG PO TABS: 5.0000 mg | ORAL_TABLET | Freq: Two times a day (BID) | ORAL | Status: DC

## 2015-01-23 NOTE — Telephone Encounter (Signed)
30 day supply sent to pharmacy.  

## 2015-01-27 ENCOUNTER — Other Ambulatory Visit: Payer: Self-pay | Admitting: Family

## 2015-01-28 ENCOUNTER — Ambulatory Visit (INDEPENDENT_AMBULATORY_CARE_PROVIDER_SITE_OTHER): Payer: Self-pay | Admitting: Adult Health

## 2015-01-28 ENCOUNTER — Other Ambulatory Visit (INDEPENDENT_AMBULATORY_CARE_PROVIDER_SITE_OTHER): Payer: Self-pay

## 2015-01-28 ENCOUNTER — Encounter: Payer: Self-pay | Admitting: Adult Health

## 2015-01-28 VITALS — BP 148/100 | Temp 98.3°F | Ht 63.5 in | Wt 158.0 lb

## 2015-01-28 DIAGNOSIS — E1159 Type 2 diabetes mellitus with other circulatory complications: Secondary | ICD-10-CM

## 2015-01-28 DIAGNOSIS — Z7689 Persons encountering health services in other specified circumstances: Secondary | ICD-10-CM

## 2015-01-28 DIAGNOSIS — I259 Chronic ischemic heart disease, unspecified: Secondary | ICD-10-CM

## 2015-01-28 DIAGNOSIS — Z7189 Other specified counseling: Secondary | ICD-10-CM

## 2015-01-28 DIAGNOSIS — I10 Essential (primary) hypertension: Secondary | ICD-10-CM

## 2015-01-28 DIAGNOSIS — R079 Chest pain, unspecified: Secondary | ICD-10-CM

## 2015-01-28 LAB — HEPATIC FUNCTION PANEL
ALBUMIN: 4.5 g/dL (ref 3.5–5.2)
ALK PHOS: 81 U/L (ref 39–117)
ALT: 23 U/L (ref 0–35)
AST: 16 U/L (ref 0–37)
BILIRUBIN DIRECT: 0.1 mg/dL (ref 0.0–0.3)
TOTAL PROTEIN: 7.8 g/dL (ref 6.0–8.3)
Total Bilirubin: 0.4 mg/dL (ref 0.2–1.2)

## 2015-01-28 LAB — BASIC METABOLIC PANEL
BUN: 25 mg/dL — ABNORMAL HIGH (ref 6–23)
CO2: 29 meq/L (ref 19–32)
Calcium: 10.8 mg/dL — ABNORMAL HIGH (ref 8.4–10.5)
Chloride: 103 mEq/L (ref 96–112)
Creatinine, Ser: 0.73 mg/dL (ref 0.40–1.20)
GFR: 87.36 mL/min (ref >60.00)
GLUCOSE: 175 mg/dL — AB (ref 70–99)
Potassium: 4.6 mEq/L (ref 3.5–5.1)
SODIUM: 142 meq/L (ref 135–145)

## 2015-01-28 LAB — CARDIAC PANEL
CK MB: 6.1 ng/mL — AB (ref 0.3–4.0)
CK TOTAL: 50 U/L (ref 7–177)
RELATIVE INDEX: 12.2 calc — AB (ref 0.0–2.5)

## 2015-01-28 LAB — HEMOGLOBIN A1C: HEMOGLOBIN A1C: 6.8 % — AB (ref 4.6–6.5)

## 2015-01-28 LAB — TROPONIN I: TNIDX: 0 ug/l (ref 0.00–0.06)

## 2015-01-28 MED ORDER — ALBUTEROL SULFATE 108 (90 BASE) MCG/ACT IN AEPB: 2.0000 | INHALATION_SPRAY | Freq: Three times a day (TID) | RESPIRATORY_TRACT | Status: DC | PRN

## 2015-01-28 MED ORDER — GABAPENTIN 600 MG PO TABS: 600.0000 mg | ORAL_TABLET | Freq: Three times a day (TID) | ORAL | Status: DC

## 2015-01-28 MED ORDER — METFORMIN HCL 1000 MG PO TABS: 1000.0000 mg | ORAL_TABLET | Freq: Two times a day (BID) | ORAL | Status: DC

## 2015-01-28 MED ORDER — GLIPIZIDE 5 MG PO TABS: 5.0000 mg | ORAL_TABLET | Freq: Two times a day (BID) | ORAL | Status: DC

## 2015-01-28 MED ORDER — CYCLOBENZAPRINE HCL 10 MG PO TABS: 10.0000 mg | ORAL_TABLET | Freq: Every evening | ORAL | Status: DC | PRN
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
## 2015-01-28 NOTE — Patient Instructions (Addendum)
Please continue to take your medication as prescribed. All the refills will be sent to the pharmacy. I will let you know what your labs show. If you continue to have chest pain, GO TO THE ER! Follow up with me in a few weeks or sooner if needed. Health Maintenance Adopting a healthy lifestyle and getting preventive care can go a long way to promote health and wellness. Talk with your health care provider about what schedule of regular examinations is right for you. This is a good chance for you to check in with your provider about disease prevention and staying healthy. In between checkups, there are plenty of things you can do on your own. Experts have done a lot of research about which lifestyle changes and preventive measures are most likely to keep you healthy. Ask your health care provider for more information. WEIGHT AND DIET  Eat a healthy diet  Be sure to include plenty of vegetables, fruits, low-fat dairy products, and lean protein.  Do not eat a lot of foods high in solid fats, added sugars, or salt.  Get regular exercise. This is one of the most important things you can do for your health.  Most adults should exercise for at least 150 minutes each week. The exercise should increase your heart rate and make you sweat (moderate-intensity exercise).  Most adults should also do strengthening exercises at least twice a week. This is in addition to the moderate-intensity exercise.  Maintain a healthy weight  Body mass index (BMI) is a measurement that can be used to identify possible weight problems. It estimates body fat based on height and weight. Your health care provider can help determine your BMI and help you achieve or maintain a healthy weight.  For females 104 years of age and older:   A BMI below 18.5 is considered underweight.  A BMI of 18.5 to 24.9 is normal.  A BMI of 25 to 29.9 is considered overweight.  A BMI of 30 and above is considered obese.  Watch levels of  cholesterol and blood lipids  You should start having your blood tested for lipids and cholesterol at 57 years of age, then have this test every 5 years.  You may need to have your cholesterol levels checked more often if:  Your lipid or cholesterol levels are high.  You are older than 57 years of age.  You are at high risk for heart disease.  CANCER SCREENING   Lung Cancer  Lung cancer screening is recommended for adults 62-49 years old who are at high risk for lung cancer because of a history of smoking.  A yearly low-dose CT scan of the lungs is recommended for people who:  Currently smoke.  Have quit within the past 15 years.  Have at least a 30-pack-year history of smoking. A pack year is smoking an average of one pack of cigarettes a day for 1 year.  Yearly screening should continue until it has been 15 years since you quit.  Yearly screening should stop if you develop a health problem that would prevent you from having lung cancer treatment.  Breast Cancer  Practice breast self-awareness. This means understanding how your breasts normally appear and feel.  It also means doing regular breast self-exams. Let your health care provider know about any changes, no matter how small.  If you are in your 20s or 30s, you should have a clinical breast exam (CBE) by a health care provider every 1-3 years as part  of a regular health exam.  If you are 30 or older, have a CBE every year. Also consider having a breast X-ray (mammogram) every year.  If you have a family history of breast cancer, talk to your health care provider about genetic screening.  If you are at high risk for breast cancer, talk to your health care provider about having an MRI and a mammogram every year.  Breast cancer gene (BRCA) assessment is recommended for women who have family members with BRCA-related cancers. BRCA-related cancers include:  Breast.  Ovarian.  Tubal.  Peritoneal  cancers.  Results of the assessment will determine the need for genetic counseling and BRCA1 and BRCA2 testing. Cervical Cancer Routine pelvic examinations to screen for cervical cancer are no longer recommended for nonpregnant women who are considered low risk for cancer of the pelvic organs (ovaries, uterus, and vagina) and who do not have symptoms. A pelvic examination may be necessary if you have symptoms including those associated with pelvic infections. Ask your health care provider if a screening pelvic exam is right for you.   The Pap test is the screening test for cervical cancer for women who are considered at risk.  If you had a hysterectomy for a problem that was not cancer or a condition that could lead to cancer, then you no longer need Pap tests.  If you are older than 65 years, and you have had normal Pap tests for the past 10 years, you no longer need to have Pap tests.  If you have had past treatment for cervical cancer or a condition that could lead to cancer, you need Pap tests and screening for cancer for at least 20 years after your treatment.  If you no longer get a Pap test, assess your risk factors if they change (such as having a new sexual partner). This can affect whether you should start being screened again.  Some women have medical problems that increase their chance of getting cervical cancer. If this is the case for you, your health care provider may recommend more frequent screening and Pap tests.  The human papillomavirus (HPV) test is another test that may be used for cervical cancer screening. The HPV test looks for the virus that can cause cell changes in the cervix. The cells collected during the Pap test can be tested for HPV.  The HPV test can be used to screen women 16 years of age and older. Getting tested for HPV can extend the interval between normal Pap tests from three to five years.  An HPV test also should be used to screen women of any age who  have unclear Pap test results.  After 57 years of age, women should have HPV testing as often as Pap tests.  Colorectal Cancer  This type of cancer can be detected and often prevented.  Routine colorectal cancer screening usually begins at 57 years of age and continues through 57 years of age.  Your health care provider may recommend screening at an earlier age if you have risk factors for colon cancer.  Your health care provider may also recommend using home test kits to check for hidden blood in the stool.  A small camera at the end of a tube can be used to examine your colon directly (sigmoidoscopy or colonoscopy). This is done to check for the earliest forms of colorectal cancer.  Routine screening usually begins at age 28.  Direct examination of the colon should be repeated every 5-10 years  through 57 years of age. However, you may need to be screened more often if early forms of precancerous polyps or small growths are found. Skin Cancer  Check your skin from head to toe regularly.  Tell your health care provider about any new moles or changes in moles, especially if there is a change in a mole's shape or color.  Also tell your health care provider if you have a mole that is larger than the size of a pencil eraser.  Always use sunscreen. Apply sunscreen liberally and repeatedly throughout the day.  Protect yourself by wearing long sleeves, pants, a wide-brimmed hat, and sunglasses whenever you are outside. HEART DISEASE, DIABETES, AND HIGH BLOOD PRESSURE   Have your blood pressure checked at least every 1-2 years. High blood pressure causes heart disease and increases the risk of stroke.  If you are between 55 years and 79 years old, ask your health care provider if you should take aspirin to prevent strokes.  Have regular diabetes screenings. This involves taking a blood sample to check your fasting blood sugar level.  If you are at a normal weight and have a low risk for  diabetes, have this test once every three years after 57 years of age.  If you are overweight and have a high risk for diabetes, consider being tested at a younger age or more often. PREVENTING INFECTION  Hepatitis B  If you have a higher risk for hepatitis B, you should be screened for this virus. You are considered at high risk for hepatitis B if:  You were born in a country where hepatitis B is common. Ask your health care provider which countries are considered high risk.  Your parents were born in a high-risk country, and you have not been immunized against hepatitis B (hepatitis B vaccine).  You have HIV or AIDS.  You use needles to inject street drugs.  You live with someone who has hepatitis B.  You have had sex with someone who has hepatitis B.  You get hemodialysis treatment.  You take certain medicines for conditions, including cancer, organ transplantation, and autoimmune conditions. Hepatitis C  Blood testing is recommended for:  Everyone born from 1945 through 1965.  Anyone with known risk factors for hepatitis C. Sexually transmitted infections (STIs)  You should be screened for sexually transmitted infections (STIs) including gonorrhea and chlamydia if:  You are sexually active and are younger than 57 years of age.  You are older than 57 years of age and your health care provider tells you that you are at risk for this type of infection.  Your sexual activity has changed since you were last screened and you are at an increased risk for chlamydia or gonorrhea. Ask your health care provider if you are at risk.  If you do not have HIV, but are at risk, it may be recommended that you take a prescription medicine daily to prevent HIV infection. This is called pre-exposure prophylaxis (PrEP). You are considered at risk if:  You are sexually active and do not regularly use condoms or know the HIV status of your partner(s).  You take drugs by injection.  You are  sexually active with a partner who has HIV. Talk with your health care provider about whether you are at high risk of being infected with HIV. If you choose to begin PrEP, you should first be tested for HIV. You should then be tested every 3 months for as long as you are taking   PrEP.  PREGNANCY   If you are premenopausal and you may become pregnant, ask your health care provider about preconception counseling.  If you may become pregnant, take 400 to 800 micrograms (mcg) of folic acid every day.  If you want to prevent pregnancy, talk to your health care provider about birth control (contraception). OSTEOPOROSIS AND MENOPAUSE   Osteoporosis is a disease in which the bones lose minerals and strength with aging. This can result in serious bone fractures. Your risk for osteoporosis can be identified using a bone density scan.  If you are 65 years of age or older, or if you are at risk for osteoporosis and fractures, ask your health care provider if you should be screened.  Ask your health care provider whether you should take a calcium or vitamin D supplement to lower your risk for osteoporosis.  Menopause may have certain physical symptoms and risks.  Hormone replacement therapy may reduce some of these symptoms and risks. Talk to your health care provider about whether hormone replacement therapy is right for you.  HOME CARE INSTRUCTIONS   Schedule regular health, dental, and eye exams.  Stay current with your immunizations.   Do not use any tobacco products including cigarettes, chewing tobacco, or electronic cigarettes.  If you are pregnant, do not drink alcohol.  If you are breastfeeding, limit how much and how often you drink alcohol.  Limit alcohol intake to no more than 1 drink per day for nonpregnant women. One drink equals 12 ounces of beer, 5 ounces of wine, or 1 ounces of hard liquor.  Do not use street drugs.  Do not share needles.  Ask your health care provider for  help if you need support or information about quitting drugs.  Tell your health care provider if you often feel depressed.  Tell your health care provider if you have ever been abused or do not feel safe at home. Document Released: 03/29/2011 Document Revised: 01/28/2014 Document Reviewed: 08/15/2013 Exit[employer] Patient Information 2015 [employer]. This information is not intended to replace advice given to you by your health care provider. Make sure you discuss any questions you have with your health care provider.  

## 2015-01-28 NOTE — Progress Notes (Signed)
Pre visit review using our clinic review tool, if applicable. No additional management support is needed unless otherwise documented below in the visit note. 

## 2015-01-29 ENCOUNTER — Telehealth: Payer: Self-pay | Admitting: Adult Health

## 2015-01-29 ENCOUNTER — Other Ambulatory Visit: Payer: Self-pay | Admitting: Adult Health

## 2015-01-29 DIAGNOSIS — D72829 Elevated white blood cell count, unspecified: Secondary | ICD-10-CM

## 2015-01-29 LAB — CBC
HCT: 49.9 % — ABNORMAL HIGH (ref 36.0–46.0)
Hemoglobin: 16.9 g/dL — ABNORMAL HIGH (ref 12.0–15.0)
MCHC: 33.8 g/dL (ref 30.0–36.0)
MCV: 90.5 fl (ref 78.0–100.0)
PLATELETS: 311 10*3/uL (ref 150.0–400.0)
RBC: 5.51 Mil/uL — AB (ref 3.87–5.11)
RDW: 14.6 % (ref 11.5–15.5)
WBC: 17.9 10*3/uL — ABNORMAL HIGH (ref 4.0–10.5)

## 2015-01-29 NOTE — Telephone Encounter (Signed)
Opened in error

## 2015-01-29 NOTE — Telephone Encounter (Signed)
Spoke with [name] on the phone regarding her labs. Her A1C has improved but her white count is high. She is going to go to get a chest xray ASAP.

## 2015-02-04 ENCOUNTER — Telehealth: Payer: Self-pay

## 2015-02-04 DIAGNOSIS — Z0279 Encounter for issue of other medical certificate: Secondary | ICD-10-CM

## 2015-02-04 NOTE — Telephone Encounter (Signed)
Left patient a detailed message on vm that forms are ready for pick up and there would be a $20 dollar charge.

## 2015-02-05 NOTE — Telephone Encounter (Signed)
Left a message for pt to return call 

## 2015-02-06 NOTE — Telephone Encounter (Signed)
Pt picked up form today

## 2015-02-12 ENCOUNTER — Ambulatory Visit (INDEPENDENT_AMBULATORY_CARE_PROVIDER_SITE_OTHER): Payer: Self-pay | Admitting: Adult Health

## 2015-02-12 ENCOUNTER — Encounter: Payer: Self-pay | Admitting: Adult Health

## 2015-02-12 VITALS — BP 130/84 | Temp 98.2°F | Wt 158.0 lb

## 2015-02-12 DIAGNOSIS — Z09 Encounter for follow-up examination after completed treatment for conditions other than malignant neoplasm: Secondary | ICD-10-CM

## 2015-02-12 DIAGNOSIS — D72829 Elevated white blood cell count, unspecified: Secondary | ICD-10-CM

## 2015-02-12 DIAGNOSIS — E785 Hyperlipidemia, unspecified: Secondary | ICD-10-CM

## 2015-02-12 NOTE — Patient Instructions (Signed)
The referral to hematology has been placed for you and they will follow up with you to make an appointment at your convenience. Follow up for your lipid panel and then I will see you in about 2.5 months for a repeat A1c. Continue to watch your diet. Good luck on quitting smoking and let me know if you need anything in the mean time.

## 2015-02-12 NOTE — Progress Notes (Signed)
Pre visit review using our clinic review tool, if applicable. No additional management support is needed unless otherwise documented below in the visit note. Lab Results  Component Value Date   HGBA1C 6.8* 01/28/2015   HGBA1C 7.6* 09/24/2014   HGBA1C 7.2* 05/22/2014   Lab Results  Component Value Date   MICROALBUR 1.4 04/05/2013   LDLCALC 143* 01/24/2014   CREATININE 0.73 01/28/2015

## 2015-02-12 NOTE — Progress Notes (Signed)
Subjective:    Patient ID: [name], female    DOB: Oct 25, 1955, 59 y.o.   MRN: [id number]  HPI  [name] presents to the office today for follow up regarding her labs. She did not get the chest x ray that I had ordered for her due to her white count of 17.6. She states " I know I smoke, and I know I have worked around chemicals my entire life, I know there is a possibility I could have cancer. I am just not looking for anything right now."   Patient endorses increased activity and less pain since increasing her Gabapentin.   She recently was taken off Remeron and placed on Rexulti by her psychiatrist. She is inquiring about just seeing someone here for her psych issues.   Her husband who accompanied her states " My wife is back! Her mood has increased tremendously."    She is going to try and quit smoking.    Review of Systems  Constitutional: Positive for activity change. Negative for fever, chills, appetite change, fatigue and unexpected weight change.  Respiratory: Negative for chest tightness, shortness of breath and wheezing.   Cardiovascular: Negative for chest pain, palpitations and leg swelling.   Past Medical History  Diagnosis Date  . Diabetes mellitus   . Fibromyalgia   . Coronary artery disease   . Anxiety   . Depression   . Asthma   . Acute MI, inferior wall, initial episode of care 07/08/2012    Cath-07/08/11 -distal RCA stent DES (99%), LAD 20-30%. EF 50% inferior hypokinesis  (infarct)  . Tobacco use disorder 07/08/2012  . Leukocytosis   . Monocytosis   . Peripheral arterial disease 01/23/2013    History   Social History  . Marital Status: Married    Spouse Name: N/A  . Number of Children: 3  . Years of Education: college   Occupational History  . Not on file.   Social History Main Topics  . Smoking status: Current Every Day Smoker -- 1.00 packs/day for 43 years    Types: Cigarettes  . Smokeless tobacco: Never Used  . Alcohol Use: No   Comment: Rarely  . Drug Use: No  . Sexual Activity: Yes   Other Topics Concern  . Not on file   Social History Narrative   Lives with husband, married for 20 years.      They have three grown chlildren.   She is not currently working and is on disability   Highest level of education:  Engineer, maintenance (IT)   Pets: two dogs and three rabbits.       Husband is a long Production assistant, radio.       Is unable to enjoy any activities because of body pains.           Past Surgical History  Procedure Laterality Date  . Cardiac catheterization    . Tonsillectomy    . Cyst removal trunk  1977    tailbone  . Coronary stent placement    . Left heart catheterization with coronary angiogram N/A 07/07/2012    Procedure: LEFT HEART CATHETERIZATION WITH CORONARY ANGIOGRAM;  Surgeon: Kathleene Hazel, MD;  Location: [facility] CATH LAB;  Service: Cardiovascular;  Laterality: N/A;  . Percutaneous coronary stent intervention (pci-s)  07/07/2012    Procedure: PERCUTANEOUS CORONARY STENT INTERVENTION (PCI-S);  Surgeon: Kathleene Hazel, MD;  Location: [facility] CATH LAB;  Service: Cardiovascular;;  . Abdominal aortagram N/A 02/07/2013    Procedure: ABDOMINAL AORTAGRAM;  Surgeon: Iran OuchMuhammad A Arida, MD;  [facility];  Service: Cardiovascular;  Laterality: N/A;    Family History  Problem Relation Age of Onset  . Kidney disease Mother   . Diabetes Mother     DM Type II  . Alzheimer's disease Mother   . Diabetes Father     IDDM  . Heart disease Father   . Cancer Father     bone cancer died at [id number]  . Cancer Sister     bone cancer at age 57  . Multiple sclerosis Daughter   . Diabetes Son 2    DM Type I  . Heart disease Maternal Grandfather   . Heart disease Paternal Grandmother   . Diabetes Son [id number]    DM Type I    Allergies  Allergen Reactions  . Statins     SEVERE MUSCLE PAIN - failed lipitor, Livalo, red yeast rice (lovastatin), and she thinks Zocor and pravastatin  . Ace Inhibitors Cough     Patient refuses to take meds  . Cymbalta [Duloxetine Hcl]     Drowsiness    Current Outpatient Prescriptions on File Prior to Visit  Medication Sig Dispense Refill  . Albuterol Sulfate (PROAIR RESPICLICK) 108 (90 BASE) MCG/ACT AEPB Inhale 2 puffs into the lungs every 8 (eight) hours as needed. 1 each 3  . aspirin 81 MG tablet Take 1 tablet (81 mg total) by mouth 2 (two) times daily.    . busPIRone (BUSPAR) 15 MG tablet Take 15 mg by mouth 2 (two) times daily.    . busPIRone (BUSPAR) 30 MG tablet Take 45 mg by mouth once.    . clopidogrel (PLAVIX) 75 MG tablet Take 1 tablet (75 mg total) by mouth daily. 90 tablet 1  . cyclobenzaprine (FLEXERIL) 10 MG tablet Take 1 tablet (10 mg total) by mouth at bedtime as needed. 30 tablet 11  . gabapentin (NEURONTIN) 600 MG tablet Take 1 tablet (600 mg total) by mouth 3 (three) times daily. 90 tablet 4  . glipiZIDE (GLUCOTROL) 5 MG tablet Take 1 tablet (5 mg total) by mouth 2 (two) times daily before a meal. 60 tablet 11  . isosorbide mononitrate (IMDUR) 60 MG 24 hr tablet Take 1 tablet (60 mg total) by mouth daily. 30 tablet 11  . metFORMIN (GLUCOPHAGE) 1000 MG tablet Take 1 tablet (1,000 mg total) by mouth 2 (two) times daily with a meal. 60 tablet 11  . nitroGLYCERIN (NITROSTAT) 0.4 MG SL tablet Place 1 tablet (0.4 mg total) under the tongue every 5 (five) minutes x 3 doses as needed for chest pain. 30 tablet 12   No current facility-administered medications on file prior to visit.    BP 130/84 mmHg  Temp(Src) 98.2 F (36.8 C) (Oral)  Wt 158 lb (71.668 kg)        Objective:   Physical Exam  Constitutional: She is oriented to person, place, and time. She appears well-developed and well-nourished. No distress.  Cardiovascular: Normal rate, regular rhythm, normal heart sounds and intact distal pulses.  Exam reveals no gallop and no friction rub.   No murmur heard. Pulmonary/Chest: Effort normal and breath sounds normal. No respiratory  distress. She has no wheezes. She has no rales. She exhibits no tenderness.  Musculoskeletal: Normal range of motion.  Neurological: She is alert and oriented to person, place, and time.  Skin: Skin is warm and dry. No rash noted. She is not diaphoretic. No erythema. No pallor.  Psychiatric: She has a  normal mood and affect. Her behavior is normal. Judgment and thought content normal.  She does seem to be in a happier mood during this visit. She is less standoffish and is willing to concede on some items for her medical care.  Nursing note and vitals reviewed.      Assessment & Plan:  1. Follow up - We reviewed her previous labs in detail.  - Follow up in 2.5 months for a1c check,  - Follow up sooner if needed  2. Leukocytosis - Ambulatory referral to Hematology - She would rather see a Hematologist then have a chest x ray at this time - She did not want a repeat CBC on today's visit.   3. Hyperlipidemia - Lipid panel; Future

## 2015-02-14 ENCOUNTER — Telehealth: Payer: Self-pay | Admitting: Internal Medicine

## 2015-02-14 NOTE — Telephone Encounter (Signed)
new patient appt-s/w patient and gave np appt for 06/15 @ 11 w/Dr. Arbutus PedMohamed Referring Dr. Esau GrewNafzier,  Dx-Leukocytosis

## 2015-02-25 ENCOUNTER — Encounter: Payer: Self-pay | Admitting: Adult Health

## 2015-02-25 ENCOUNTER — Other Ambulatory Visit: Payer: Self-pay | Admitting: Adult Health

## 2015-02-25 DIAGNOSIS — D72829 Elevated white blood cell count, unspecified: Secondary | ICD-10-CM

## 2015-03-06 ENCOUNTER — Encounter: Payer: Self-pay | Admitting: Adult Health

## 2015-03-06 ENCOUNTER — Ambulatory Visit (INDEPENDENT_AMBULATORY_CARE_PROVIDER_SITE_OTHER)
Admission: RE | Admit: 2015-03-06 | Discharge: 2015-03-06 | Disposition: A | Discharge status: Home / Self Care | Payer: Self-pay | Source: Ambulatory Visit | Attending: Adult Health | Admitting: Adult Health

## 2015-03-06 ENCOUNTER — Encounter: Payer: Self-pay | Admitting: Cardiology

## 2015-03-06 ENCOUNTER — Ambulatory Visit (INDEPENDENT_AMBULATORY_CARE_PROVIDER_SITE_OTHER): Payer: Self-pay | Admitting: Cardiology

## 2015-03-06 ENCOUNTER — Other Ambulatory Visit: Payer: Self-pay | Admitting: *Deleted

## 2015-03-06 VITALS — BP 134/78 | HR 84 | Ht 63.5 in | Wt 156.2 lb

## 2015-03-06 DIAGNOSIS — I739 Peripheral vascular disease, unspecified: Secondary | ICD-10-CM

## 2015-03-06 DIAGNOSIS — I2119 ST elevation (STEMI) myocardial infarction involving other coronary artery of inferior wall: Secondary | ICD-10-CM

## 2015-03-06 DIAGNOSIS — D72829 Elevated white blood cell count, unspecified: Secondary | ICD-10-CM

## 2015-03-06 MED ORDER — ISOSORBIDE MONONITRATE ER 60 MG PO TB24: 90.0000 mg | ORAL_TABLET | Freq: Every day | ORAL | Status: DC

## 2015-03-06 MED ORDER — CLOPIDOGREL BISULFATE 75 MG PO TABS: 75.0000 mg | ORAL_TABLET | Freq: Every day | ORAL | Status: DC

## 2015-03-06 NOTE — Progress Notes (Signed)
HPI The patient presents for followup of CAD.  Catheterization 2012 demonstrated 20% proximal stenosis and 30% mid stenosis in the LAD. The circumflex had 20% proximal stenosis. The right coronary artery and 20% stenosis with distal vessel 99% stenosis. The patient underwent PTCA and stenting of the distal RCA.  She was having significant leg pain. I sent her to see Dr. Kirke Corin.  He did lower extremity angiography which demonstrated, initially, severe disease in right external iliac artery. However, this improved significantly with NTG.  She has been treated with Imdur.  She returns for follow up and she has been having some increased discomfort in her legs which is limiting her walking. She has had episodes of increasing fatigue. She had one episode of severe exhaustion about 2 months ago with her "body feeling strange". She thought maybe she was having a heart attack. She's not describing classic substernal chest pressure, neck or arm discomfort. She's not been having any palpitations, presyncope or syncope. She's not having any PND or orthopnea. She's not having any weight gain or edema.   Allergies  Allergen Reactions  . Statins     SEVERE MUSCLE PAIN - failed lipitor, Livalo, red yeast rice (lovastatin), and she thinks Zocor and pravastatin  . Ace Inhibitors Cough    Patient refuses to take meds  . Cymbalta [Duloxetine Hcl]     Drowsiness    Current Outpatient Prescriptions  Medication Sig Dispense Refill  . Albuterol Sulfate (PROAIR RESPICLICK) 108 (90 BASE) MCG/ACT AEPB Inhale 2 puffs into the lungs every 8 (eight) hours as needed. 1 each 3  . aspirin 81 MG tablet Take 1 tablet (81 mg total) by mouth 2 (two) times daily.    . Brexpiprazole 1 MG TABS Take 1 tablet by mouth at bedtime.    . busPIRone (BUSPAR) 15 MG tablet Take 15 mg by mouth 2 (two) times daily.    . busPIRone (BUSPAR) 30 MG tablet Take 45 mg by mouth once.    . clopidogrel (PLAVIX) 75 MG tablet Take 1 tablet (75 mg  total) by mouth daily. 90 tablet 1  . cyclobenzaprine (FLEXERIL) 10 MG tablet Take 1 tablet (10 mg total) by mouth at bedtime as needed. 30 tablet 11  . gabapentin (NEURONTIN) 600 MG tablet Take 1 tablet (600 mg total) by mouth 3 (three) times daily. 90 tablet 4  . glipiZIDE (GLUCOTROL) 5 MG tablet Take 1 tablet (5 mg total) by mouth 2 (two) times daily before a meal. 60 tablet 11  . guanFACINE (TENEX) 1 MG tablet Take 1 mg by mouth at bedtime.    . isosorbide mononitrate (IMDUR) 60 MG 24 hr tablet Take 1 tablet (60 mg total) by mouth daily. 30 tablet 11  . metFORMIN (GLUCOPHAGE) 1000 MG tablet Take 1 tablet (1,000 mg total) by mouth 2 (two) times daily with a meal. 60 tablet 11  . nitroGLYCERIN (NITROSTAT) 0.4 MG SL tablet Place 1 tablet (0.4 mg total) under the tongue every 5 (five) minutes x 3 doses as needed for chest pain. 30 tablet 12   No current facility-administered medications for this visit.    Past Medical History  Diagnosis Date  . Diabetes mellitus   . Fibromyalgia   . Coronary artery disease   . Anxiety   . Depression   . Asthma   . Acute MI, inferior wall, initial episode of care 07/08/2012    Cath-07/08/11 -distal RCA stent DES (99%), LAD 20-30%. EF 50% inferior hypokinesis  (infarct)  .  Tobacco use disorder 07/08/2012  . Leukocytosis   . Monocytosis   . Peripheral arterial disease 01/23/2013    Past Surgical History  Procedure Laterality Date  . Cardiac catheterization    . Tonsillectomy    . Cyst removal trunk  1977    tailbone  . Coronary stent placement    . Left heart catheterization with coronary angiogram N/A 07/07/2012    Procedure: LEFT HEART CATHETERIZATION WITH CORONARY ANGIOGRAM;  Surgeon: Kathleene Hazel, MD;  Location: [facility] C[facility] LAB;  Service: Cardiovascular;  Laterality: N/A;  . Percutaneous coronary stent intervention (pci-s)  07/07/2012    Procedure: PERCUTANEOUS CORONARY STENT INTERVENTION (PCI-S);  Surgeon: Kathleene Hazel, MD;   Location: [facility] CATH LAB;  Service: Cardiovascular;;  . Abdominal aortagram N/A 02/07/2013    Procedure: ABDOMINAL Ronny Flurry;  Surgeon: Iran Ouch, MD;  Location: [facility] CATH LAB;  Service: Cardiovascular;  Laterality: N/A;    ROS:  As stated in the HPI and negative for all other systems.  PHYSICAL EXAM BP 134/78 mmHg  Pulse 84  Ht 5' 3.5" (1.613 m)  Wt 156 lb 3.2 oz (70.852 kg)  BMI 27.23 kg/m2 GENERAL:  Well appearing NECK:  No jugular venous distention, waveform within normal limits, carotid upstroke brisk and symmetric, soft right carotid bruit, no thyromegaly LUNGS:  Clear to auscultation bilaterally CHEST:  Unremarkable HEART:  PMI not displaced or sustained,S1 and S2 within normal limits, no S3, no S4, no clicks, no rubs, no murmurs ABD:  Flat, positive bowel sounds normal in frequency in pitch, no bruits, no rebound, no guarding, no midline pulsatile mass, no hepatomegaly, no splenomegaly EXT:  2 plus pulses upper, 1+ dorsalis pedis bilateral, absent posterior tibialis bilateral, no edema, no cyanosis no clubbing  EKG:  01/28/15  Normal sinus rhythm, rate 94, axis within normal limits, intervals within normal limits, possible left atrial enlargement, probable old inferior infarct. No acute ST-T wave changes.   ASSESSMENT AND PLAN  CAD  The patient has no new symptoms that are very suggestive of angina. She wants conservative therapy right now she doesn't have insurance. I will continue to manage her medically for the vague symptoms that she has that could be cardiac and for her leg pain which might be spasm. She didn't tolerate 120 mg of Imdur but I will try 90 mg.  Leg pain She will continue medications as listed and we discussed when necessary dosing of sublingual nitroglycerin.  Tobacco abuse We have discussed this on multiple occasions she has been unable to stop.  Dyslipidemia She has been intolerant of all statins.  Carotid stenosis This has been very mild.  No follow  up is indicated.   Sleep apnea She was diagnosed with this but she cannot use CPAP.

## 2015-03-06 NOTE — Patient Instructions (Signed)
Your physician recommends that you schedule a follow-up appointment in: one year with Dr. Antoine Poche  We are increasing your imdur to 90 mg which will be 1 1/2 tabs  Of the 60 mg

## 2015-03-07 ENCOUNTER — Telehealth: Payer: Self-pay | Admitting: Cardiology

## 2015-03-07 NOTE — Telephone Encounter (Signed)
6.9.16 Received forms from patient for [employer] Disability and [place] [employer] for Dr Antoine Poche to complete and sign.  Sent forms to [employer] [facility] [employer] to process. Advised [employer] patient to be mailing check for payment of 2 forms. Sent to [employer] 03/06/15. lp

## 2015-03-10 ENCOUNTER — Telehealth: Payer: Self-pay

## 2015-03-10 NOTE — Telephone Encounter (Signed)
Patient states "I don't believe in mammograms, so know I don't want to have one"!

## 2015-03-12 ENCOUNTER — Ambulatory Visit (HOSPITAL_BASED_OUTPATIENT_CLINIC_OR_DEPARTMENT_OTHER): Payer: Self-pay | Admitting: Internal Medicine

## 2015-03-12 ENCOUNTER — Other Ambulatory Visit: Payer: Self-pay | Admitting: Internal Medicine

## 2015-03-12 ENCOUNTER — Ambulatory Visit: Payer: Self-pay

## 2015-03-12 ENCOUNTER — Other Ambulatory Visit (HOSPITAL_BASED_OUTPATIENT_CLINIC_OR_DEPARTMENT_OTHER): Payer: Self-pay

## 2015-03-12 ENCOUNTER — Telehealth: Payer: Self-pay | Admitting: Internal Medicine

## 2015-03-12 ENCOUNTER — Encounter: Payer: Self-pay | Admitting: Internal Medicine

## 2015-03-12 ENCOUNTER — Other Ambulatory Visit: Payer: Self-pay | Admitting: Medical Oncology

## 2015-03-12 VITALS — BP 120/61 | HR 94 | Temp 98.2°F | Resp 17 | Ht 63.5 in | Wt 157.4 lb

## 2015-03-12 DIAGNOSIS — D72821 Monocytosis (symptomatic): Secondary | ICD-10-CM

## 2015-03-12 DIAGNOSIS — D72829 Elevated white blood cell count, unspecified: Secondary | ICD-10-CM

## 2015-03-12 DIAGNOSIS — Z720 Tobacco use: Secondary | ICD-10-CM

## 2015-03-12 DIAGNOSIS — Z807 Family history of other malignant neoplasms of lymphoid, hematopoietic and related tissues: Secondary | ICD-10-CM

## 2015-03-12 DIAGNOSIS — Z801 Family history of malignant neoplasm of trachea, bronchus and lung: Secondary | ICD-10-CM

## 2015-03-12 LAB — CBC WITH DIFFERENTIAL/PLATELET
BASO%: 0.8 % (ref 0.0–2.0)
Basophils Absolute: 0.1 10*3/uL (ref 0.0–0.1)
EOS ABS: 0.5 10*3/uL (ref 0.0–0.5)
EOS%: 3.1 % (ref 0.0–7.0)
HCT: 50.3 % — ABNORMAL HIGH (ref 34.8–46.6)
HGB: 16.5 g/dL — ABNORMAL HIGH (ref 11.6–15.9)
LYMPH#: 2.7 10*3/uL (ref 0.9–3.3)
LYMPH%: 17.3 % (ref 14.0–49.7)
MCH: 30.3 pg (ref 25.1–34.0)
MCHC: 32.8 g/dL (ref 31.5–36.0)
MCV: 92.4 fL (ref 79.5–101.0)
MONO#: 1.1 10*3/uL — AB (ref 0.1–0.9)
MONO%: 6.8 % (ref 0.0–14.0)
NEUT%: 72 % (ref 38.4–76.8)
NEUTROS ABS: 11.4 10*3/uL — AB (ref 1.5–6.5)
Platelets: 329 10*3/uL (ref 145–400)
RBC: 5.45 10*6/uL (ref 3.70–5.45)
RDW: 13.7 % (ref 11.2–14.5)
WBC: 15.8 10*3/uL — ABNORMAL HIGH (ref 3.9–10.3)

## 2015-03-12 LAB — COMPREHENSIVE METABOLIC PANEL (CC13)
ALK PHOS: 97 U/L (ref 40–150)
ALT: 23 U/L (ref 0–55)
ANION GAP: 14 meq/L — AB (ref 3–11)
AST: 19 U/L (ref 5–34)
Albumin: 4.1 g/dL (ref 3.5–5.0)
BILIRUBIN TOTAL: 0.26 mg/dL (ref 0.20–1.20)
BUN: 17.9 mg/dL (ref 7.0–26.0)
CO2: 22 mEq/L (ref 22–29)
Calcium: 11 mg/dL — ABNORMAL HIGH (ref 8.4–10.4)
Chloride: 106 mEq/L (ref 98–109)
Creatinine: 0.9 mg/dL (ref 0.6–1.1)
EGFR: 71 mL/min/{1.73_m2} — AB (ref >90)
GLUCOSE: 184 mg/dL — AB (ref 70–140)
Potassium: 4.6 mEq/L (ref 3.5–5.1)
SODIUM: 142 meq/L (ref 136–145)
Total Protein: 7.5 g/dL (ref 6.4–8.3)

## 2015-03-12 LAB — LACTATE DEHYDROGENASE (CC13): LDH: 229 U/L (ref 125–245)

## 2015-03-12 NOTE — Telephone Encounter (Signed)
6.15.16 Received completed form back from [employer].  Forms given to [facility] for Dr Antoine Poche to review and sign.  lp

## 2015-03-12 NOTE — Progress Notes (Signed)
[place] [phone]   [phone]  CONSULT NOTE  REFERRING PHYSICIAN: [name], NP  REASON FOR CONSULTATION:  59 years old white female with persistent leukocytosis.  HPI [name] is a 59 y.o. female with past medical history significant for diabetes mellitus, depression, peripheral arterial disease, fibromyalgia as well as myocardial infarction in 2012. The patient was seen recently by her primary care physician and had repeat CBC performed on 01/28/2015 that showed elevated white blood count of 17.9, hemoglobin 16.9, hematocrit 49.9% with normal platelets count 311,000. She was referred to me today for evaluation and recommendation regarding her persistent elevation of the white blood count. The patient mentions that she has elevated white blood count for more than 20 years. She had chronic sinus and gum infection. Reviewing her available records showed CBC on 08/14/2008 was elevated white blood count of 17.4 as well as elevated hemoglobin of 16.6 and hematocrit 50.3%. CBC on 07/07/2012 showed white blood count of 22.8. The patient is feeling fine except for mild fatigue. She is currently on disability from several medical issues including the myocardial infarction fibromyalgia as well as the diabetes mellitus. She denied having any bleeding issues but has occasional bruises from her current treatment with Plavix and aspirin. She has no palpable lymphadenopathy. She denied having any significant weight loss or night sweats. She has no chest pain but has occasional shortness of breath and mild cough productive of whitish sputum. She denied having any headache or visual changes. Family history significant for mother with diabetes mellitus, father had multiple myeloma and a sister had lung cancer. The patient is married and has 3 children. 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 She is currently on disability. She has a  history of smoking less than one pack per day for around 44 years and unfortunately she continues to smoke and I strongly encouraged her to quit smoking. She has no history of alcohol or drug abuse. HPI  Past Medical History  Diagnosis Date  . Diabetes mellitus   . Fibromyalgia   . Coronary artery disease   . Anxiety   . Depression   . Asthma   . Acute MI, inferior wall, initial episode of care 07/08/2012    Cath-07/08/11 -distal RCA stent DES (99%), LAD 20-30%. EF 50% inferior hypokinesis  (infarct)  . Tobacco use disorder 07/08/2012  . Leukocytosis   . Monocytosis   . Peripheral arterial disease 01/23/2013    Past Surgical History  Procedure Laterality Date  . Cardiac catheterization    . Tonsillectomy    . Cyst removal trunk  1977    tailbone  . Coronary stent placement    . Left heart catheterization with coronary angiogram N/A 07/07/2012    Procedure: LEFT HEART CATHETERIZATION WITH CORONARY ANGIOGRAM;  Surgeon: Burnell Blanks, MD;  Location: [facility] CATH LAB;  Service: Cardiovascular;  Laterality: N/A;  . Percutaneous coronary stent intervention (pci-s)  07/07/2012    Procedure: PERCUTANEOUS CORONARY STENT INTERVENTION (PCI-S);  Surgeon: Burnell Blanks, MD;  Location: [facility] CATH LAB;  Service: Cardiovascular;;  . Abdominal aortagram N/A 02/07/2013    Procedure: ABDOMINAL Maxcine Ham;  Surgeon: Wellington Hampshire, MD;  Location: [facility] CATH LAB;  Service: Cardiovascular;  Laterality: N/A;    Family History  Problem Relation Age of Onset  . Kidney disease Mother   . Diabetes Mother     DM Type II  . Alzheimer's disease Mother   . Diabetes Father  IDDM  . Heart disease Father   . Cancer Father     bone cancer died at 43  . Cancer Sister     bone cancer at age 88  . Multiple sclerosis Daughter   . Diabetes Son 2    DM Type I  . Heart disease Maternal Grandfather   . Heart disease Paternal Grandmother   . Diabetes Son 9    DM Type I    Social  History History  Substance Use Topics  . Smoking status: Current Every Day Smoker -- 1.00 packs/day for 43 years    Types: Cigarettes  . Smokeless tobacco: Never Used  . Alcohol Use: No     Comment: Rarely    Allergies  Allergen Reactions  . Statins     SEVERE MUSCLE PAIN - failed lipitor, Livalo, red yeast rice (lovastatin), and she thinks Zocor and pravastatin  . Ace Inhibitors Cough    Patient refuses to take meds  . Cymbalta [Duloxetine Hcl]     Drowsiness    Current Outpatient Prescriptions  Medication Sig Dispense Refill  . Albuterol Sulfate (PROAIR RESPICLICK) 308 (90 BASE) MCG/ACT AEPB Inhale 2 puffs into the lungs every 8 (eight) hours as needed. 1 each 3  . aspirin 81 MG tablet Take 1 tablet (81 mg total) by mouth 2 (two) times daily.    . Brexpiprazole 1 MG TABS Take 1 tablet by mouth at bedtime.    . busPIRone (BUSPAR) 15 MG tablet Take 15 mg by mouth 2 (two) times daily.    . busPIRone (BUSPAR) 30 MG tablet Take 45 mg by mouth once.    . clopidogrel (PLAVIX) 75 MG tablet Take 1 tablet (75 mg total) by mouth daily. 90 tablet 3  . cyclobenzaprine (FLEXERIL) 10 MG tablet Take 1 tablet (10 mg total) by mouth at bedtime as needed. 30 tablet 11  . gabapentin (NEURONTIN) 600 MG tablet Take 1 tablet (600 mg total) by mouth 3 (three) times daily. 90 tablet 4  . glipiZIDE (GLUCOTROL) 5 MG tablet Take 1 tablet (5 mg total) by mouth 2 (two) times daily before a meal. 60 tablet 11  . guanFACINE (TENEX) 1 MG tablet Take 1 mg by mouth at bedtime.    . isosorbide mononitrate (IMDUR) 60 MG 24 hr tablet Take 1.5 tablets (90 mg total) by mouth daily. 135 tablet 3  . metFORMIN (GLUCOPHAGE) 1000 MG tablet Take 1 tablet (1,000 mg total) by mouth 2 (two) times daily with a meal. 60 tablet 11  . naproxen sodium (ANAPROX) 220 MG tablet Take 220 mg by mouth 2 (two) times daily with a meal.    . nitroGLYCERIN (NITROSTAT) 0.4 MG SL tablet Place 1 tablet (0.4 mg total) under the tongue every 5  (five) minutes x 3 doses as needed for chest pain. (Patient not taking: Reported on 03/12/2015) 30 tablet 12   No current facility-administered medications for this visit.    Review of Systems  Constitutional: positive for fatigue Eyes: negative Ears, nose, mouth, throat, and face: negative Respiratory: positive for cough and dyspnea on exertion Cardiovascular: negative Gastrointestinal: negative Genitourinary:negative Integument/breast: negative Hematologic/lymphatic: negative Musculoskeletal:positive for myalgias Neurological: negative Behavioral/Psych: negative Endocrine: negative Allergic/Immunologic: negative  Physical Exam  MVH:QIONG, healthy, no distress, well nourished and well developed SKIN: skin color, texture, turgor are normal, no rashes or significant lesions HEAD: Normocephalic, No masses, lesions, tenderness or abnormalities EYES: normal, PERRLA EARS: External ears normal, Canals clear OROPHARYNX:no exudate, no erythema and lips, buccal mucosa,  and tongue normal  NECK: supple, no adenopathy, no JVD LYMPH:  no palpable lymphadenopathy, no hepatosplenomegaly BREAST:not examined LUNGS: clear to auscultation , and palpation HEART: regular rate & rhythm, no murmurs and no gallops ABDOMEN:abdomen soft, non-tender, normal bowel sounds and no masses or organomegaly BACK: Back symmetric, no curvature., No CVA tenderness EXTREMITIES:no joint deformities, effusion, or inflammation, no edema, no skin discoloration  NEURO: alert & oriented x 3 with fluent speech, no focal motor/sensory deficits  PERFORMANCE STATUS: ECOG 1  LABORATORY DATA: Lab Results  Component Value Date   WBC 15.8* 03/12/2015   HGB 16.5* 03/12/2015   HCT 50.3* 03/12/2015   MCV 92.4 03/12/2015   PLT 329 03/12/2015      Chemistry      Component Value Date/Time   NA 142 03/12/2015 1115   NA 142 01/28/2015 1517   K 4.6 03/12/2015 1115   K 4.6 01/28/2015 1517   CL 103 01/28/2015 1517   CO2  22 03/12/2015 1115   CO2 29 01/28/2015 1517   BUN 17.9 03/12/2015 1115   BUN 25* 01/28/2015 1517   CREATININE 0.9 03/12/2015 1115   CREATININE 0.73 01/28/2015 1517      Component Value Date/Time   CALCIUM 11.0* 03/12/2015 1115   CALCIUM 10.8* 01/28/2015 1517   ALKPHOS 97 03/12/2015 1115   ALKPHOS 81 01/28/2015 1517   AST 19 03/12/2015 1115   AST 16 01/28/2015 1517   ALT 23 03/12/2015 1115   ALT 23 01/28/2015 1517   BILITOT 0.26 03/12/2015 1115   BILITOT 0.4 01/28/2015 1517       RADIOGRAPHIC STUDIES: Dg Chest 2 View  03/06/2015   CLINICAL DATA:  Cough and congestion.  EXAM: CHEST  2 VIEW  COMPARISON:  None.  FINDINGS: Mediastinum hilar structures are normal. The lungs are clear. Heart size normal. No pleural effusion or pneumothorax. No acute bony abnormality.  IMPRESSION: No acute cardiopulmonary disease.   Electronically Signed   By: Marcello Moores  Register   On: 03/06/2015 14:32    ASSESSMENT: This is a very pleasant 59 years old white female with persistent leukocytosis that has been going on for years and most likely reactive in nature secondary to chronic sinusitis and gum infection, but myeloproliferative disorder cannot be ruled out at this point.   PLAN: I had a lengthy discussion with the patient about her persistent leukocytosis and further investigation to confirm a diagnosis. I'll order repeat CBC, comprehensive metabolic panel, LDH as well as molecular study for BCR/ABL. I recommended for the patient to continue on observation for now. I would see her back for follow-up visit in one month's for reevaluation with repeat CBC and discussion of the pending results of the molecular study. The patient was advised to continue her routine follow-up visit with her primary care physician for management of the chronic infection. She was advised to call immediately if she has any concerning symptoms in the interval. The patient voices understanding of current disease status and treatment  options and is in agreement with the current care plan.  All questions were answered. The patient knows to call the clinic with any problems, questions or concerns. We can certainly see the patient much sooner if necessary.  Thank you so much for allowing me to participate in the care of [name]. I will continue to follow up the patient with you and assist in her care.  I spent 30 minutes counseling the patient face to face. The total time spent in the appointment was  55 minutes.  Disclaimer: This note was dictated with voice recognition software. Similar sounding words can inadvertently be transcribed and may not be corrected upon review.   Ibrohim Simmers K. March 12, 2015, 12:01 PM

## 2015-03-12 NOTE — Progress Notes (Signed)
Checked in new pt with no insurance.  Pt is trying to resolve a problem she is having trying to get her VA insurance.  Asked if she wanted to apply for Medicaid but she said she would not qualify due to income.  I gave her an application to apply for a discount thru the hospital but again she doesn't think she will qualify.  I informed her since she's self-pay she will receive a 50 or 55% discount.  She verbalized understanding.

## 2015-03-12 NOTE — Telephone Encounter (Signed)
Pt confirmed labs/ov per 06/15 POF, gave pt AVS and Calendar..... KJ °

## 2015-04-09 ENCOUNTER — Other Ambulatory Visit: Payer: Self-pay

## 2015-04-09 ENCOUNTER — Ambulatory Visit: Payer: Self-pay | Admitting: Internal Medicine

## 2015-04-10 ENCOUNTER — Telehealth: Payer: Self-pay | Admitting: Internal Medicine

## 2015-04-10 NOTE — Telephone Encounter (Signed)
returned call and s.w. pt and confirmed that her 7.13 appt was cx...pt ok and aware

## 2015-05-28 ENCOUNTER — Telehealth: Payer: Self-pay | Admitting: Cardiology

## 2015-05-28 NOTE — Telephone Encounter (Signed)
New Message  Pt husband calling to speak w/ RN concerning disability paperwork that was supposed to be signed and sent off in June. Pt husband requested to speak w/ RN. Please call back and discuss.

## 2015-05-28 NOTE — Telephone Encounter (Signed)
Spoke with patient's husband. He states they dropped some paperwork off for patient to get retirement (disability) and the form is a 7AR form. He states they were told the form has not been received by the agency to which it was to go. On 6/10 the paperwork was documented as received by medical records and sent for processing and on 6/15 was given to Dr. Armando Gang to sign off on but there is no further documentation that the paperwork was submitted.   Informed Mr. Lesly Rubenstein that this message would be routed to [place] and [place] with medical records to assist him further with this matter

## 2015-06-05 NOTE — Telephone Encounter (Signed)
Signed disabilty form was given to [employer] in medical records

## 2015-06-19 ENCOUNTER — Telehealth: Payer: Self-pay | Admitting: Cardiology

## 2015-06-19 NOTE — Telephone Encounter (Signed)
Patient was notified on 06/05/15 that forms- [employer] and [employer] Disability had been signed and ready for pick up. lp

## 2015-07-09 ENCOUNTER — Other Ambulatory Visit: Payer: Self-pay | Admitting: Adult Health

## 2015-07-17 ENCOUNTER — Encounter: Payer: Self-pay | Admitting: Adult Health

## 2015-07-17 ENCOUNTER — Ambulatory Visit (INDEPENDENT_AMBULATORY_CARE_PROVIDER_SITE_OTHER): Payer: Self-pay | Admitting: Adult Health

## 2015-07-17 VITALS — BP 160/80 | Temp 98.9°F | Ht 63.5 in | Wt 158.6 lb

## 2015-07-17 DIAGNOSIS — IMO0002 Reserved for concepts with insufficient information to code with codable children: Secondary | ICD-10-CM

## 2015-07-17 DIAGNOSIS — M797 Fibromyalgia: Secondary | ICD-10-CM

## 2015-07-17 DIAGNOSIS — F418 Other specified anxiety disorders: Secondary | ICD-10-CM

## 2015-07-17 DIAGNOSIS — E1159 Type 2 diabetes mellitus with other circulatory complications: Secondary | ICD-10-CM

## 2015-07-17 DIAGNOSIS — E1165 Type 2 diabetes mellitus with hyperglycemia: Secondary | ICD-10-CM

## 2015-07-17 MED ORDER — CLONAZEPAM 0.5 MG PO TABS: 0.5000 mg | ORAL_TABLET | Freq: Every day | ORAL | Status: DC

## 2015-07-17 MED ORDER — FLUOXETINE HCL 20 MG PO TABS: 20.0000 mg | ORAL_TABLET | Freq: Every day | ORAL | Status: DC

## 2015-07-17 MED ORDER — AMITRIPTYLINE HCL 25 MG PO TABS: 25.0000 mg | ORAL_TABLET | Freq: Every day | ORAL | Status: DC

## 2015-07-17 NOTE — Progress Notes (Signed)
Pre visit review using our clinic review tool, if applicable. No additional management support is needed unless otherwise documented below in the visit note.,j  

## 2015-07-17 NOTE — Progress Notes (Signed)
Subjective:    Patient ID: [name], female    DOB: Jul 02, 1956, 59 y.o.   MRN: [id number]  HPI  59 year old female who presents to the office today with many issues.She currently has limited income from long-term disability that was provided by her previous employer. She is trying to apply for additional disability, but was told that it would take up to 24 months.  Depression/Anxiety - Is taking Buspar which helps " to some degree" for her anxiety, she is also taking Klonopin. She recently "fired" her psychiatrist due to the fact that she thought that he was not giving her the proper amount of time during her office visits" was just writing me prescriptions". She is currently not taking any medication for her depression. The patient has suffered from depression for most of her life "I've taking pretty much every medication there is out there to take". She does feel like her depression is becoming worse, she is not getting out of bed as much as she used to, and not enjoying activities that she once used. To this is due to chronic pain from fibromyalgia. She denies any type of suicidal ideation or self-harm.   Fibromyalgia   Appears to only be on right side of her body. She is currently taking 3600 mg of gabapentin daily, she reports "it's like spitting in the ocean,the medication is just not helping". She would like to try alternative therapies to help with her fibromyalgia pain.    Diabetes  She is having issues with her blood sugars being low. She reports lows in the 30's. She is currently taking  Metformin BID and glipizide 10 mg BID. She eats most of her meals in the evening.She denies changing her diet.she denies any LOC from hypoglycemia.   Review of Systems  Constitutional: Positive for activity change and fatigue. Negative for fever, chills, diaphoresis and appetite change.  Respiratory: Negative.   Cardiovascular: Negative.   Musculoskeletal: Positive for myalgias, back  pain, arthralgias, gait problem and neck pain. Negative for joint swelling and neck stiffness.  Skin: Negative.   Neurological: Negative.   Psychiatric/Behavioral: Positive for behavioral problems, sleep disturbance and decreased concentration. Negative for suicidal ideas and self-injury. The patient is nervous/anxious.        Depressed  All other systems reviewed and are negative.  Past Medical History  Diagnosis Date  . Diabetes mellitus   . Fibromyalgia   . Coronary artery disease   . Anxiety   . Depression   . Asthma   . Acute MI, inferior wall, initial episode of care 07/08/2012    Cath-07/08/11 -distal RCA stent DES (99%), LAD 20-30%. EF 50% inferior hypokinesis  (infarct)  . Tobacco use disorder 07/08/2012  . Leukocytosis   . Monocytosis   . Peripheral arterial disease 01/23/2013    Social History   Social History  . Marital Status: Married    Spouse Name: N/A  . Number of Children: 3  . Years of Education: college   Occupational History  . Not on file.   Social History Main Topics  . Smoking status: Current Every Day Smoker -- 1.00 packs/day for 43 years    Types: Cigarettes  . Smokeless tobacco: Never Used  . Alcohol Use: No     Comment: Rarely  . Drug Use: No  . Sexual Activity: Yes   Other Topics Concern  . Not on file   Social History Narrative   Lives with husband, married for 20 years.  They have three grown chlildren.   She is not currently working and is on disability   Highest level of education:  Engineer, maintenance (IT)College graduate   Pets: two dogs and three rabbits.       Husband is a long Production assistant, radiohaul truck driver.       Is unable to enjoy any activities because of body pains.           Past Surgical History  Procedure Laterality Date  . Cardiac catheterization    . Tonsillectomy    . Cyst removal trunk  1977    tailbone  . Coronary stent placement    . Left heart catheterization with coronary angiogram N/A 07/07/2012    Procedure: LEFT HEART  CATHETERIZATION WITH CORONARY ANGIOGRAM;  Surgeon: Kathleene Hazelhristopher D McAlhany, MD;  [facility];  Service: Cardiovascular;  Laterality: N/A;  . Percutaneous coronary stent intervention (pci-s)  07/07/2012    Procedure: PERCUTANEOUS CORONARY STENT INTERVENTION (PCI-S);  Surgeon: Kathleene Hazelhristopher D McAlhany, MD;  [facility];  Service: Cardiovascular;;  . Abdominal aortagram N/A 02/07/2013    Procedure: ABDOMINAL Ronny FlurryAORTAGRAM;  Surgeon: Iran OuchMuhammad A Arida, MD;  [facility];  Service: Cardiovascular;  Laterality: N/A;    Family History  Problem Relation Age of Onset  . Kidney disease Mother   . Diabetes Mother     DM Type II  . Alzheimer's disease Mother   . Diabetes Father     IDDM  . Heart disease Father   . Cancer Father     bone cancer died at 5452  . Cancer Sister     bone cancer at age 57  . Multiple sclerosis Daughter   . Diabetes Son 2    DM Type I  . Heart disease Maternal Grandfather   . Heart disease Paternal Grandmother   . Diabetes Son [id number]    DM Type I    Allergies  Allergen Reactions  . Statins     SEVERE MUSCLE PAIN - failed lipitor, Livalo, red yeast rice (lovastatin), and she thinks Zocor and pravastatin  . Ace Inhibitors Cough    Patient refuses to take meds  . Cymbalta [Duloxetine Hcl]     Drowsiness    Current Outpatient Prescriptions on File Prior to Visit  Medication Sig Dispense Refill  . Albuterol Sulfate (PROAIR RESPICLICK) 108 (90 BASE) MCG/ACT AEPB Inhale 2 puffs into the lungs every 8 (eight) hours as needed. 1 each 3  . aspirin 81 MG tablet Take 1 tablet (81 mg total) by mouth 2 (two) times daily.    . busPIRone (BUSPAR) 15 MG tablet Take 15 mg by mouth 2 (two) times daily.    . busPIRone (BUSPAR) 30 MG tablet Take 45 mg by mouth once.    . clopidogrel (PLAVIX) 75 MG tablet Take 1 tablet (75 mg total) by mouth daily. 90 tablet 3  . cyclobenzaprine (FLEXERIL) 10 MG tablet Take 1 tablet (10 mg total) by mouth at bedtime as needed. 30  tablet 11  . gabapentin (NEURONTIN) 600 MG tablet TAKE ONE TABLET BY MOUTH THREE TIMES DAILY 90 tablet 4  . glipiZIDE (GLUCOTROL) 5 MG tablet Take 1 tablet (5 mg total) by mouth 2 (two) times daily before a meal. 60 tablet 11  . isosorbide mononitrate (IMDUR) 60 MG 24 hr tablet Take 1.5 tablets (90 mg total) by mouth daily. 135 tablet 3  . metFORMIN (GLUCOPHAGE) 1000 MG tablet Take 1 tablet (1,000 mg total) by mouth 2 (two) times daily with  a meal. 60 tablet 11  . nitroGLYCERIN (NITROSTAT) 0.4 MG SL tablet Place 1 tablet (0.4 mg total) under the tongue every 5 (five) minutes x 3 doses as needed for chest pain. 30 tablet 12  . naproxen sodium (ANAPROX) 220 MG tablet Take 220 mg by mouth 2 (two) times daily with a meal.     No current facility-administered medications on file prior to visit.    BP 160/80 mmHg  Temp(Src) 98.9 F (37.2 C) (Oral)  Ht 5' 3.5" (1.613 m)  Wt 158 lb 9.6 oz (71.94 kg)  BMI 27.65 kg/m2       Objective:   Physical Exam  Constitutional: She is oriented to person, place, and time. She appears well-developed and well-nourished. No distress.  Eyes: Right eye exhibits no discharge. Left eye exhibits no discharge.  Neck: Normal range of motion. Neck supple. No thyromegaly present.  Cardiovascular: Normal rate, regular rhythm, normal heart sounds and intact distal pulses.  Exam reveals no gallop and no friction rub.   No murmur heard. Pulmonary/Chest: Effort normal and breath sounds normal. No respiratory distress. She has no wheezes. She has no rales. She exhibits no tenderness.  Musculoskeletal: Normal range of motion. She exhibits tenderness (to right leg). She exhibits no edema.  Walks with a four prong cane  Lymphadenopathy:    She has no cervical adenopathy.  Neurological: She is alert and oriented to person, place, and time.  Skin: Skin is warm and dry. No rash noted. She is not diaphoretic. No erythema. No pallor.  Psychiatric: She has a normal mood and  affect. Her behavior is normal. Judgment and thought content normal.  Nursing note and vitals reviewed.     Assessment & Plan:  1. Depression with anxiety - FLUoxetine (PROZAC) 20 MG tablet; Take 1 tablet (20 mg total) by mouth daily.  Dispense: 30 tablet; Refill: 3 - amitriptyline (ELAVIL) 25 MG tablet; Take 1 tablet (25 mg total) by mouth at bedtime.  Dispense: 30 tablet; Refill: 3 - clonazePAM (KLONOPIN) 0.5 MG tablet; Take 1 tablet (0.5 mg total) by mouth at bedtime.  Dispense: 30 tablet; Refill: 0 - follow-up in 2 weeks -To the ER with any thoughts of suicide or self-harm -pH Q9 score of 27 -Does not currently want to see a psychiatrist due to having limited funds. I advised her that it was in her best interest to see somebody in this profession due to her major depressive disorder.  2. Fibromyalgia - FLUoxetine (PROZAC) 20 MG tablet; Take 1 tablet (20 mg total) by mouth daily.  Dispense: 30 tablet; Refill: 3 - amitriptyline (ELAVIL) 25 MG tablet; Take 1 tablet (25 mg total) by mouth at bedtime.  Dispense: 30 tablet; Refill: 3  3. Uncontrolled type 2 diabetes mellitus with other circulatory complication, without long-term current use of insulin (HCC) -she does not have the money for an A1c lab draw at this time -Will do A1c during next visit in 2 weeks -Discontinue glipizide 10 mg in the morning. Continue with glipizide 10 mg in the evening as well as metformin  twice a day

## 2015-07-17 NOTE — Patient Instructions (Signed)
It was great seeing you again!  Take the Amitriptyline at night and the Prozac during the day.   Follow up with me in 2-3 weeks for a follow up.   Do not take the glipizide at night.   If you need anything in the meantime, please let me know

## 2015-09-05 ENCOUNTER — Encounter: Payer: Self-pay | Admitting: Adult Health

## 2015-09-05 ENCOUNTER — Ambulatory Visit (INDEPENDENT_AMBULATORY_CARE_PROVIDER_SITE_OTHER): Payer: Self-pay | Admitting: Adult Health

## 2015-09-05 VITALS — BP 138/72 | Temp 98.1°F | Ht 63.5 in | Wt 160.0 lb

## 2015-09-05 DIAGNOSIS — E1121 Type 2 diabetes mellitus with diabetic nephropathy: Secondary | ICD-10-CM

## 2015-09-05 DIAGNOSIS — F32A Depression, unspecified: Secondary | ICD-10-CM

## 2015-09-05 DIAGNOSIS — IMO0002 Reserved for concepts with insufficient information to code with codable children: Secondary | ICD-10-CM

## 2015-09-05 DIAGNOSIS — G250 Essential tremor: Secondary | ICD-10-CM

## 2015-09-05 DIAGNOSIS — M797 Fibromyalgia: Secondary | ICD-10-CM

## 2015-09-05 DIAGNOSIS — E1165 Type 2 diabetes mellitus with hyperglycemia: Secondary | ICD-10-CM

## 2015-09-05 DIAGNOSIS — F17200 Nicotine dependence, unspecified, uncomplicated: Secondary | ICD-10-CM

## 2015-09-05 DIAGNOSIS — F329 Major depressive disorder, single episode, unspecified: Secondary | ICD-10-CM

## 2015-09-05 MED ORDER — PROPRANOLOL HCL 40 MG PO TABS: 40.0000 mg | ORAL_TABLET | Freq: Two times a day (BID) | ORAL | Status: DC

## 2015-09-05 NOTE — Progress Notes (Signed)
Subjective:    Patient ID: [name], female    DOB: [birth year], 57 y.o.   MRN: [id number]  HPI  59 year old female who  has a past medical history of Diabetes mellitus; Fibromyalgia; Coronary artery disease; Anxiety; Depression; Asthma; Acute MI, inferior wall, initial episode of care [facility]) (07/08/2012); Tobacco use disorder (07/08/2012); Leukocytosis; Monocytosis; and Peripheral arterial disease (HCC) (01/23/2013). presents to the office today for follow up regarding her medications. The last time I saw here was on 07/17/2015 where she was started on Prozac ( depression and fibromyalgia pain) and Elavil ( Fibromyalgia pain) Today she reports that her depression seems to be better, she has more energy and is getting out of the house more, her husband endorses this as well.   Unfortunately, in regards to her chronic Fibromyalgia pain, the Prozac and Elavil have not made a difference in the her pain. She feels as though the Elavil has dried her mouth out and she is having a hard time swallowing pills.   She also feels as though her upper extremity tremors are becoming worse. She does not endorse any issues with strength or grip, " my hands are just shaking a lot more." She has had nerve conduction studies in the past and " Will never do those again, because of the pain." She denies any cogwheel, pill rolling, unsteady gate, or dropping items more frequently. Her biggest complaint is that of " having a hard time writing, because of the shaking".    Review of Systems  Constitutional: Negative.   Respiratory: Negative.   Cardiovascular: Negative.   Gastrointestinal: Negative.   Musculoskeletal: Positive for myalgias, back pain and arthralgias. Negative for joint swelling, gait problem, neck pain and neck stiffness.  Skin: Negative.   Neurological: Positive for tremors. Negative for dizziness, light-headedness and headaches.  Hematological: Negative.   Psychiatric/Behavioral: Positive for  agitation.   Past Medical History  Diagnosis Date  . Diabetes mellitus   . Fibromyalgia   . Coronary artery disease   . Anxiety   . Depression   . Asthma   . Acute MI, inferior wall, initial episode of care [facility]) 07/08/2012    Cath-07/08/11 -distal RCA stent DES (99%), LAD 20-30%. EF 50% inferior hypokinesis  (infarct)  . Tobacco use disorder 07/08/2012  . Leukocytosis   . Monocytosis   . Peripheral arterial disease (HCC) 01/23/2013    Social History   Social History  . Marital Status: Married    Spouse Name: N/A  . Number of Children: 3  . Years of Education: college   Occupational History  . Not on file.   Social History Main Topics  . Smoking status: Current Every Day Smoker -- 1.00 packs/day for 43 years    Types: Cigarettes  . Smokeless tobacco: Never Used  . Alcohol Use: No     Comment: Rarely  . Drug Use: No  . Sexual Activity: Yes   Other Topics Concern  . Not on file   Social History Narrative   Lives with husband, married for 20 years.      They have three grown chlildren.   She is not currently working and is on disability   Highest level of education:  Engineer, maintenance (IT)   Pets: two dogs and three rabbits.       Husband is a long Production assistant, radio.       Is unable to enjoy any activities because of body pains.  Past Surgical History  Procedure Laterality Date  . Cardiac catheterization    . Tonsillectomy    . Cyst removal trunk  1977    tailbone  . Coronary stent placement    . Left heart catheterization with coronary angiogram N/A 07/07/2012    Procedure: LEFT HEART CATHETERIZATION WITH CORONARY ANGIOGRAM;  Surgeon: Kathleene Hazel, MD;  [facility] CATH LAB;  Service: Cardiovascular;  Laterality: N/A;  . Percutaneous coronary stent intervention (pci-s)  07/07/2012    Procedure: PERCUTANEOUS CORONARY STENT INTERVENTION (PCI-S);  Surgeon: Kathleene Hazel, MD;  Location: [facility] CATH LAB;  Service: Cardiovascular;;  .  Abdominal aortagram N/A 02/07/2013    Procedure: ABDOMINAL Ronny Flurry;  Surgeon: Iran Ouch, MD;  Location: [facility] CATH LAB;  Service: Cardiovascular;  Laterality: N/A;    Family History  Problem Relation Age of Onset  . Kidney disease Mother   . Diabetes Mother     DM Type II  . Alzheimer's disease Mother   . Diabetes Father     IDDM  . Heart disease Father   . Cancer Father     bone cancer died at 4  . Cancer Sister     bone cancer at age 51  . Multiple sclerosis Daughter   . Diabetes Son 2    DM Type I  . Heart disease Maternal Grandfather   . Heart disease Paternal Grandmother   . Diabetes Son 42    DM Type I    Allergies  Allergen Reactions  . Statins     SEVERE MUSCLE PAIN - failed lipitor, Livalo, red yeast rice (lovastatin), and she thinks Zocor and pravastatin  . Ace Inhibitors Cough    Patient refuses to take meds  . Cymbalta [Duloxetine Hcl]     Drowsiness    Current Outpatient Prescriptions on File Prior to Visit  Medication Sig Dispense Refill  . Albuterol Sulfate (PROAIR RESPICLICK) 108 (90 BASE) MCG/ACT AEPB Inhale 2 puffs into the lungs every 8 (eight) hours as needed. 1 each 3  . amitriptyline (ELAVIL) 25 MG tablet Take 1 tablet (25 mg total) by mouth at bedtime. 30 tablet 3  . aspirin 81 MG tablet Take 1 tablet (81 mg total) by mouth 2 (two) times daily.    . busPIRone (BUSPAR) 30 MG tablet Take 45 mg by mouth once.    . clonazePAM (KLONOPIN) 0.5 MG tablet Take 1 tablet (0.5 mg total) by mouth at bedtime. 30 tablet 0  . clopidogrel (PLAVIX) 75 MG tablet Take 1 tablet (75 mg total) by mouth daily. 90 tablet 3  . cyclobenzaprine (FLEXERIL) 10 MG tablet Take 1 tablet (10 mg total) by mouth at bedtime as needed. 30 tablet 11  . FLUoxetine (PROZAC) 20 MG tablet Take 1 tablet (20 mg total) by mouth daily. 30 tablet 3  . gabapentin (NEURONTIN) 600 MG tablet TAKE ONE TABLET BY MOUTH THREE TIMES DAILY (Patient taking differently: TAKE 2 TABLETS BY MOUTH THREE  TIMES DAILY) 90 tablet 4  . glipiZIDE (GLUCOTROL) 5 MG tablet Take 1 tablet (5 mg total) by mouth 2 (two) times daily before a meal. (Patient taking differently: Take 5 mg by mouth daily before breakfast. ) 60 tablet 11  . isosorbide mononitrate (IMDUR) 60 MG 24 hr tablet Take 1.5 tablets (90 mg total) by mouth daily. 135 tablet 3  . metFORMIN (GLUCOPHAGE) 1000 MG tablet Take 1 tablet (1,000 mg total) by mouth 2 (two) times daily with a meal. 60 tablet 11  . naproxen  sodium (ANAPROX) 220 MG tablet Take 220 mg by mouth 2 (two) times daily with a meal.    . nitroGLYCERIN (NITROSTAT) 0.4 MG SL tablet Place 1 tablet (0.4 mg total) under the tongue every 5 (five) minutes x 3 doses as needed for chest pain. 30 tablet 12   No current facility-administered medications on file prior to visit.    BP 138/72 mmHg  Temp(Src) 98.1 F (36.7 C) (Oral)  Ht 5' 3.5" (1.613 m)  Wt 160 lb (72.576 kg)  BMI 27.89 kg/m2       Objective:   Physical Exam  Constitutional: She is oriented to person, place, and time. She appears well-developed and well-nourished. No distress.  Smells of cigarette smoke  Neck: Normal range of motion. Neck supple.  Cardiovascular: Normal rate, regular rhythm, normal heart sounds and intact distal pulses.  Exam reveals no gallop and no friction rub.   No murmur heard. Pulmonary/Chest: Effort normal. No respiratory distress. She has wheezes (slight, full fields). She has no rales.  Musculoskeletal: Normal range of motion. She exhibits no edema or tenderness.  Lymphadenopathy:    She has no cervical adenopathy.  Neurological: She is alert and oriented to person, place, and time. She has normal reflexes. She displays normal reflexes. No cranial nerve deficit. She exhibits normal muscle tone. Coordination normal.  Resting tremor R>L No cogwheel regidity No finger taping.   Skin: Skin is warm and dry. No rash noted. She is not diaphoretic. No erythema. No pallor.  Psychiatric: She  has a normal mood and affect. Her behavior is normal. Judgment and thought content normal.  Nursing note and vitals reviewed.     Assessment & Plan:  1. Essential tremor - Will try Inderal for tremors. She is self pay and does not want to see a specialist right now.  - propranolol (INDERAL) 40 MG tablet; Take 1 tablet (40 mg total) by mouth 2 (two) times daily.  Dispense: 60 tablet; Refill: 3 - Consider follow up to neurology in the future.  2. Fibromyalgia - D/C Inderal   3. Uncontrolled type 2 diabetes mellitus with diabetic nephropathy, without long-term current use of insulin (HCC) - Hemoglobin A1c - Consider adding another oral agent 4. Tobacco use disorder - Does not want to quit.   5. Depression - Continue to Buspar and Prozac

## 2015-09-05 NOTE — Progress Notes (Signed)
Pre visit review using our clinic review tool, if applicable. No additional management support is needed unless otherwise documented below in the visit note. 

## 2015-09-05 NOTE — Patient Instructions (Addendum)
It was great seeing you today  Stop taking the Amitriptyline  Start Propranolol 40 mg twice a day, if you feel dizzy, stop taking the medication  I will follow up with you on Monday about your A1c.      Essential Tremor A tremor is trembling or shaking that you cannot control. Most tremors affect the hands or arms. Tremors can also affect the head, vocal cords, face, and other parts of the body.  Essential tremor is a tremor without a known cause.  CAUSES Essential tremor has no known cause.  RISK FACTORS You may be at greater risk of essential tremor if:   You have a family member with essential tremor.   You are age 57 or older.   You take certain medicines. SIGNS AND SYMPTOMS The main sign of a tremor is uncontrolled and unintentional rhythmic shaking of a body part.  You may have difficulty eating with a spoon or fork.   You may have difficulty writing.   You may nod your head up and down or side to side.   You may have a quivering voice.  Your tremors:  May get worse over time.   May come and go.   May be more noticeable on one side of your body.   May get worse due to stress, fatigue, caffeine, and extreme heat or cold.  DIAGNOSIS Your health care provider can diagnose essential tremor based on your symptoms, medical history, and a physical examination. There is no single test to diagnose an essential tremor. However, your health care provider may perform a variety of tests to rule out other conditions. Tests may include:   Blood and urine tests.   Imaging studies of your brain, such as:   CT scan.   MRI.   A test that measures involuntary muscle movement (electromyogram). TREATMENT Your tremors may go away without treatment. Mild tremors may not need treatment if they do not affect your day-to-day life. Severe tremors may need to be treated using one or a combination of the following options:   Medicines. This may include medicine that  is injected.  Lifestyle changes.   Physical therapy.  HOME CARE INSTRUCTIONS  Take medicines only as directed by your health care provider.   Limit alcohol intake to no more than 1 drink per day for nonpregnant women and 2 drinks per day for men. One drink equals 12 oz of beer, 5 oz of wine, or 1 oz of hard liquor.  Do not use any tobacco products, including cigarettes, chewing tobacco, or electronic cigarettes. If you need help quitting, ask your health care provider.  Take medicines only as directed by your health care provider.   Avoid extreme heat or cold.   Limit the amount of caffeine you consumeas directed by your health care provider.   Try to get eight hours of sleep each night.  Find ways to manage your stress, such as meditation or yoga.  Keep all follow-up visits as directed by your health care provider. This is important. This includes any physical therapy visits. SEEK MEDICAL CARE IF:  You experience any changes in the location or intensity of your tremors.   You start having a tremor after starting a new medicine.   You have tremor with other symptoms such as:   Numbness.   Tingling.   Pain.   Weakness.   Your tremor gets worse.   Your tremor interferes with your daily life.    This information is not  intended to replace advice given to you by your health care provider. Make sure you discuss any questions you have with your health care provider.   Document Released: 10/04/2014 Document Reviewed: 10/04/2014 Elsevier Interactive Patient Education [employer]

## 2015-09-06 LAB — HEMOGLOBIN A1C
HEMOGLOBIN A1C: 6.9 % — AB (ref <5.7)
MEAN PLASMA GLUCOSE: 151 mg/dL — AB (ref <117)

## 2015-11-06 ENCOUNTER — Telehealth: Payer: Self-pay | Admitting: Adult Health

## 2015-11-06 MED ORDER — GABAPENTIN 600 MG PO TABS: 600.0000 mg | ORAL_TABLET | Freq: Three times a day (TID) | ORAL | Status: DC

## 2015-11-06 NOTE — Telephone Encounter (Signed)
Pt request refill of the following: gabapentin (NEURONTIN) 600 MG tablet     Phamacy:

## 2015-11-06 NOTE — Telephone Encounter (Signed)
.  PTR

## 2015-11-06 NOTE — Telephone Encounter (Signed)
Rx sent to pharmacy   

## 2015-11-12 ENCOUNTER — Encounter: Payer: Self-pay | Admitting: Adult Health

## 2015-11-12 ENCOUNTER — Ambulatory Visit (INDEPENDENT_AMBULATORY_CARE_PROVIDER_SITE_OTHER): Payer: Self-pay | Admitting: Adult Health

## 2015-11-12 VITALS — BP 170/100 | HR 119 | Temp 97.5°F

## 2015-11-12 DIAGNOSIS — R300 Dysuria: Secondary | ICD-10-CM

## 2015-11-12 LAB — POC URINALSYSI DIPSTICK (AUTOMATED)
BILIRUBIN UA: NEGATIVE
Glucose, UA: NEGATIVE
KETONES UA: NEGATIVE
Leukocytes, UA: NEGATIVE
NITRITE UA: NEGATIVE
PH UA: 6
Protein, UA: NEGATIVE
RBC UA: NEGATIVE
Spec Grav, UA: 1.015
Urobilinogen, UA: 0.2

## 2015-11-12 NOTE — Patient Instructions (Signed)
As always, it was great seeing you .   I will follow up with you regarding your blood work and urine culture.   Monitor blood pressure at home and let me know if it continues to be elevated.   Please let me know if you need anything.

## 2015-11-12 NOTE — Progress Notes (Signed)
Subjective:    Patient ID: [name], female    DOB: [birth year], 58 y.o.   MRN: [id number]  HPI  60 year old female, presents to the office today for suspected UTI. She reports that over the past two weeks she has had dysuria, frequency, cloudy and odorous urine. Today in the office when she gave a urine sample she reports that her urine was clear. She states " I know I have an infection some where in my body."   Today she also reports that she is in a lot of pain due to Fibromyalgia. She reports that she has had very good success with the Tramadol and Prozac combination.      Review of Systems  Constitutional: Negative.   Respiratory: Negative.   Cardiovascular: Negative.   Gastrointestinal: Negative.   Genitourinary: Positive for dysuria and frequency. Negative for urgency, flank pain, decreased urine volume and pelvic pain.  Musculoskeletal: Positive for myalgias, arthralgias and gait problem.  Neurological: Negative.   All other systems reviewed and are negative.  Past Medical History  Diagnosis Date  . Diabetes mellitus   . Fibromyalgia   . Coronary artery disease   . Anxiety   . Depression   . Asthma   . Acute MI, inferior wall, initial episode of care [facility]) 07/08/2012    Cath-07/08/11 -distal RCA stent DES (99%), LAD 20-30%. EF 50% inferior hypokinesis  (infarct)  . Tobacco use disorder 07/08/2012  . Leukocytosis   . Monocytosis   . Peripheral arterial disease (HCC) 01/23/2013    Social History   Social History  . Marital Status: Married    Spouse Name: N/A  . Number of Children: 3  . Years of Education: college   Occupational History  . Not on file.   Social History Main Topics  . Smoking status: Current Every Day Smoker -- 1.00 packs/day for 43 years    Types: Cigarettes  . Smokeless tobacco: Never Used  . Alcohol Use: No     Comment: Rarely  . Drug Use: No  . Sexual Activity: Yes   Other Topics Concern  . Not on file   Social History  Narrative   Lives with husband, married for 20 years.      They have three grown chlildren.   She is not currently working and is on disability   Highest level of education:  Engineer, maintenance (IT)   Pets: two dogs and three rabbits.       Husband is a long Production assistant, radio.       Is unable to enjoy any activities because of body pains.           Past Surgical History  Procedure Laterality Date  . Cardiac catheterization    . Tonsillectomy    . Cyst removal trunk  1977    tailbone  . Coronary stent placement    . Left heart catheterization with coronary angiogram N/A 07/07/2012    Procedure: LEFT HEART CATHETERIZATION WITH CORONARY ANGIOGRAM;  Surgeon: Kathleene Hazel, MD;  Location: [facility] CATH LAB;  Service: Cardiovascular;  Laterality: N/A;  . Percutaneous coronary stent intervention (pci-s)  07/07/2012    Procedure: PERCUTANEOUS CORONARY STENT INTERVENTION (PCI-S);  Surgeon: Kathleene Hazel, MD;  Location: [facility] CATH LAB;  Service: Cardiovascular;;  . Abdominal aortagram N/A 02/07/2013    Procedure: ABDOMINAL Ronny Flurry;  Surgeon: Iran Ouch, MD;  Location: [facility] CATH LAB;  Service: Cardiovascular;  Laterality: N/A;    Family History  Problem Relation  Age of Onset  . Kidney disease Mother   . Diabetes Mother     DM Type II  . Alzheimer's disease Mother   . Diabetes Father     IDDM  . Heart disease Father   . Cancer Father     bone cancer died at 75  . Cancer Sister     bone cancer at age 29  . Multiple sclerosis Daughter   . Diabetes Son 2    DM Type I  . Heart disease Maternal Grandfather   . Heart disease Paternal Grandmother   . Diabetes Son 29    DM Type I    Allergies  Allergen Reactions  . Statins     SEVERE MUSCLE PAIN - failed lipitor, Livalo, red yeast rice (lovastatin), and she thinks Zocor and pravastatin  . Ace Inhibitors Cough    Patient refuses to take meds  . Cymbalta [Duloxetine Hcl]     Drowsiness    Current Outpatient Prescriptions  on File Prior to Visit  Medication Sig Dispense Refill  . Albuterol Sulfate (PROAIR RESPICLICK) 108 (90 BASE) MCG/ACT AEPB Inhale 2 puffs into the lungs every 8 (eight) hours as needed. 1 each 3  . amitriptyline (ELAVIL) 25 MG tablet Take 1 tablet (25 mg total) by mouth at bedtime. 30 tablet 3  . aspirin 81 MG tablet Take 1 tablet (81 mg total) by mouth 2 (two) times daily.    . busPIRone (BUSPAR) 30 MG tablet Take 45 mg by mouth once.    . clonazePAM (KLONOPIN) 0.5 MG tablet Take 1 tablet (0.5 mg total) by mouth at bedtime. 30 tablet 0  . clopidogrel (PLAVIX) 75 MG tablet Take 1 tablet (75 mg total) by mouth daily. 90 tablet 3  . cyclobenzaprine (FLEXERIL) 10 MG tablet Take 1 tablet (10 mg total) by mouth at bedtime as needed. 30 tablet 11  . FLUoxetine (PROZAC) 20 MG tablet Take 1 tablet (20 mg total) by mouth daily. 30 tablet 3  . gabapentin (NEURONTIN) 600 MG tablet Take 1 tablet (600 mg total) by mouth 3 (three) times daily. 90 tablet 4  . glipiZIDE (GLUCOTROL) 5 MG tablet Take 1 tablet (5 mg total) by mouth 2 (two) times daily before a meal. (Patient taking differently: Take 5 mg by mouth daily before breakfast. ) 60 tablet 11  . isosorbide mononitrate (IMDUR) 60 MG 24 hr tablet Take 1.5 tablets (90 mg total) by mouth daily. 135 tablet 3  . metFORMIN (GLUCOPHAGE) 1000 MG tablet Take 1 tablet (1,000 mg total) by mouth 2 (two) times daily with a meal. 60 tablet 11  . naproxen sodium (ANAPROX) 220 MG tablet Take 220 mg by mouth 2 (two) times daily with a meal.    . nitroGLYCERIN (NITROSTAT) 0.4 MG SL tablet Place 1 tablet (0.4 mg total) under the tongue every 5 (five) minutes x 3 doses as needed for chest pain. 30 tablet 12  . propranolol (INDERAL) 40 MG tablet Take 1 tablet (40 mg total) by mouth 2 (two) times daily. 60 tablet 3   No current facility-administered medications on file prior to visit.    BP 170/100 mmHg  Pulse 119  Temp(Src) 97.5 F (36.4 C) (Oral)       Objective:    Physical Exam  Constitutional: She is oriented to person, place, and time. She appears well-developed and well-nourished. No distress.  Cardiovascular: Normal rate, regular rhythm, normal heart sounds and intact distal pulses.  Exam reveals no friction rub.  No murmur heard. Pulmonary/Chest: Effort normal. No respiratory distress. She has no wheezes. She has no rales. She exhibits no tenderness.  Abdominal: Soft. Bowel sounds are normal. She exhibits no distension and no mass. There is no tenderness. There is no rebound and no guarding.  Musculoskeletal: Normal range of motion.  Walks with a 4 prong cain  Neurological: She is alert and oriented to person, place, and time.  Skin: Skin is warm and dry. No rash noted. She is not diaphoretic. No erythema. No pallor.  Psychiatric: She has a normal mood and affect. Her behavior is normal. Judgment and thought content normal.  Vitals reviewed.     Assessment & Plan:  1. Dysuria - POCT Urinalysis Dipstick (Automated)- Negative - CBC with Differential/Platelet - Culture, Urine - advised OTC Azo until we get culture back. - Consider referral to Urology.    * Offered Toradol shot for pain, she refused. Her BP is elevated and I think this is due to a pain response. She will continue to monitor.

## 2015-11-12 NOTE — Progress Notes (Signed)
Pre visit review using our clinic review tool, if applicable. No additional management support is needed unless otherwise documented below in the visit note. 

## 2015-11-13 ENCOUNTER — Telehealth: Payer: Self-pay

## 2015-11-13 LAB — CBC WITH DIFFERENTIAL/PLATELET
BASOS PCT: 0.4 % (ref 0.0–3.0)
Basophils Absolute: 0.1 10*3/uL (ref 0.0–0.1)
EOS PCT: 2.6 % (ref 0.0–5.0)
Eosinophils Absolute: 0.4 10*3/uL (ref 0.0–0.7)
HCT: 53.1 % — ABNORMAL HIGH (ref 36.0–46.0)
Hemoglobin: 18 g/dL (ref 12.0–15.0)
LYMPHS ABS: 2.9 10*3/uL (ref 0.7–4.0)
Lymphocytes Relative: 18 % (ref 12.0–46.0)
MCHC: 34 g/dL (ref 30.0–36.0)
MCV: 91.4 fl (ref 78.0–100.0)
MONO ABS: 1.2 10*3/uL — AB (ref 0.1–1.0)
Monocytes Relative: 7.2 % (ref 3.0–12.0)
NEUTROS ABS: 11.8 10*3/uL — AB (ref 1.4–7.7)
NEUTROS PCT: 71.8 % (ref 43.0–77.0)
PLATELETS: 273 10*3/uL (ref 150.0–400.0)
RBC: 5.81 Mil/uL — ABNORMAL HIGH (ref 3.87–5.11)
RDW: 13.7 % (ref 11.5–15.5)
WBC: 16.4 10*3/uL — ABNORMAL HIGH (ref 4.0–10.5)

## 2015-11-13 NOTE — Telephone Encounter (Signed)
Noted. She is a chain smoker.

## 2015-11-13 NOTE — Telephone Encounter (Signed)
CRITICAL VALUE STICKER  CRITICAL VALUE: Hemoglobin 18  RECEIVER (INITALS): NK (recieved from Nana at 12 pm)  DATE & TIME NOTIFIED: 2.16.2017 at 12 pm   MD NOTIFIED:Cory Nafzgier AGNP   TIME OF NOTIFICATION: 12 pm   RESPONSE:

## 2015-11-14 ENCOUNTER — Other Ambulatory Visit: Payer: Self-pay | Admitting: Adult Health

## 2015-11-14 LAB — URINE CULTURE

## 2015-11-14 MED ORDER — CIPROFLOXACIN HCL 500 MG PO TABS: 500.0000 mg | ORAL_TABLET | Freq: Two times a day (BID) | ORAL | Status: DC

## 2015-12-06 ENCOUNTER — Other Ambulatory Visit: Payer: Self-pay | Admitting: Adult Health

## 2016-01-16 ENCOUNTER — Other Ambulatory Visit: Payer: Self-pay | Admitting: Adult Health

## 2016-02-24 ENCOUNTER — Other Ambulatory Visit: Payer: Self-pay | Admitting: Adult Health

## 2016-02-24 NOTE — Telephone Encounter (Signed)
Ok to refill for 6 months 

## 2016-02-24 NOTE — Telephone Encounter (Signed)
Ok to refill 

## 2016-04-06 ENCOUNTER — Other Ambulatory Visit: Payer: Self-pay | Admitting: Adult Health

## 2016-04-13 ENCOUNTER — Encounter: Payer: Self-pay | Admitting: Adult Health

## 2016-04-13 ENCOUNTER — Ambulatory Visit (INDEPENDENT_AMBULATORY_CARE_PROVIDER_SITE_OTHER): Payer: Self-pay | Admitting: Adult Health

## 2016-04-13 VITALS — BP 110/70 | Ht 63.5 in | Wt 155.8 lb

## 2016-04-13 DIAGNOSIS — N3090 Cystitis, unspecified without hematuria: Secondary | ICD-10-CM

## 2016-04-13 DIAGNOSIS — N3000 Acute cystitis without hematuria: Secondary | ICD-10-CM

## 2016-04-13 DIAGNOSIS — Z760 Encounter for issue of repeat prescription: Secondary | ICD-10-CM

## 2016-04-13 LAB — POC URINALSYSI DIPSTICK (AUTOMATED)
BILIRUBIN UA: NEGATIVE
GLUCOSE UA: NEGATIVE
Ketones, UA: NEGATIVE
Leukocytes, UA: NEGATIVE
Nitrite, UA: POSITIVE
PH UA: 6
Protein, UA: NEGATIVE
RBC UA: NEGATIVE
SPEC GRAV UA: 1.02
Urobilinogen, UA: 0.2

## 2016-04-13 MED ORDER — SULFAMETHOXAZOLE-TRIMETHOPRIM 800-160 MG PO TABS: 1.0000 | ORAL_TABLET | Freq: Two times a day (BID) | ORAL | Status: DC

## 2016-04-13 MED ORDER — CLOPIDOGREL BISULFATE 75 MG PO TABS: 75.0000 mg | ORAL_TABLET | Freq: Every day | ORAL | Status: DC

## 2016-04-13 MED ORDER — BUSPIRONE HCL 30 MG PO TABS: 45.0000 mg | ORAL_TABLET | Freq: Once | ORAL | Status: DC

## 2016-04-13 NOTE — Progress Notes (Signed)
STM:[id number] Janis Cuffe, NP No chief complaint on file.   Current Issues:  Presents with 14 days of dysuria, urinary urgency and urinary frequency Associated symptoms include:  cloudy urine, dysuria, flank pain bilaterally, urinary frequency, urinary incontinence and urinary urgency  There is a previous history of of similar symptoms. Sexually active:  Yes with female.   No concern for STI.  Prior to Admission medications   Medication Sig Start Date End Date Taking? Authorizing Provider  Albuterol Sulfate (PROAIR RESPICLICK) 108 (90 BASE) MCG/ACT AEPB Inhale 2 puffs into the lungs every 8 (eight) hours as needed. 01/28/15  Yes Shirline Frees, NP  amitriptyline (ELAVIL) 25 MG tablet Take 1 tablet (25 mg total) by mouth at bedtime. 07/17/15  Yes Shirline Frees, NP  aspirin 81 MG tablet Take 1 tablet (81 mg total) by mouth 2 (two) times daily. 10/04/12  Yes Rollene Rotunda, MD  busPIRone (BUSPAR) 30 MG tablet Take 45 mg by mouth once.   Yes Historical Provider, MD  ciprofloxacin (CIPRO) 500 MG tablet Take 1 tablet (500 mg total) by mouth 2 (two) times daily. 11/14/15  Yes Shirline Frees, NP  clonazePAM (KLONOPIN) 0.5 MG tablet Take 1 tablet (0.5 mg total) by mouth at bedtime. 07/17/15  Yes Shirline Frees, NP  clopidogrel (PLAVIX) 75 MG tablet Take 1 tablet (75 mg total) by mouth daily. 03/06/15  Yes Rollene Rotunda, MD  cyclobenzaprine (FLEXERIL) 10 MG tablet TAKE ONE TABLET (10 MG TOTAL) BY MOUTH AT BEDTIME AS NEEDED 02/24/16  Yes Shirline Frees, NP  FLUoxetine (PROZAC) 20 MG capsule TAKE ONE CAPSULE BY MOUTH ONCE DAILY 12/08/15  Yes Shirline Frees, NP  gabapentin (NEURONTIN) 600 MG tablet Take 1 tablet (600 mg total) by mouth 3 (three) times daily. 11/06/15  Yes Shirline Frees, NP  glipiZIDE (GLUCOTROL) 5 MG tablet Take 1 tablet (5 mg total) by mouth 2 (two) times daily before a meal. Patient taking differently: Take 5 mg by mouth daily before breakfast.  01/28/15  Yes Shirline Frees, NP  isosorbide mononitrate (IMDUR)  60 MG 24 hr tablet Take 1.5 tablets (90 mg total) by mouth daily. 03/06/15  Yes Rollene Rotunda, MD  metFORMIN (GLUCOPHAGE) 1000 MG tablet TAKE ONE TABLET (1000 MG TOTAL) BY MOUTH TWICE DAILY WITH A MEAL 04/07/16  Yes Shirline Frees, NP  naproxen sodium (ANAPROX) 220 MG tablet Take 220 mg by mouth 2 (two) times daily with a meal.   Yes Historical Provider, MD  nitroGLYCERIN (NITROSTAT) 0.4 MG SL tablet Place 1 tablet (0.4 mg total) under the tongue every 5 (five) minutes x 3 doses as needed for chest pain. 07/08/12  Yes Jake Bathe, MD  propranolol (INDERAL) 40 MG tablet TAKE ONE TABLET BY MOUTH TWICE DAILY 02/24/16  Yes Shirline Frees, NP    Review of Systems:Negative unless mentioned above.   PE:  BP 110/70 mmHg  Ht 5' 3.5" (1.613 m)  Wt 155 lb 12.8 oz (70.67 kg)  BMI 27.16 kg/m2 Constitutional Alert and oriented, does not appear to be in any acute distress Heart: Normal rate, normal rhythm Lungs: Clear to auscultation  Back: CVA tenderness on right  Abdomen: No masses, tenderness, distension or rebound with palpation   Assessment and Plan:    1. Acute cystitis without hematuria [N30.00] - POCT Urinalysis Dipstick (Automated) + nitrates - She would not like a culture done due to cost - She had culture done in February - sulfamethoxazole-trimethoprim (BACTRIM DS,SEPTRA DS) 800-160 MG tablet; Take 1 tablet by mouth 2 (two) times daily.  Dispense: 14 tablet; Refill: 0  3. Medication refill  - clopidogrel (PLAVIX) 75 MG tablet; Take 1 tablet (75 mg total) by mouth daily.  Dispense: 90 tablet; Refill: 1 - busPIRone (BUSPAR) 30 MG tablet; Take 1.5 tablets (45 mg total) by mouth once.  Dispense: 135 tablet; Refill: 3  Shirline Freesory Chang Tiggs, NP

## 2016-04-13 NOTE — Patient Instructions (Signed)

## 2016-04-15 NOTE — Addendum Note (Signed)
Addended by: Nancy FetterNAFZIGER, Paxtyn Boyar L on: 04/15/2016 06:20 AM   Modules accepted: Level of Service

## 2016-04-16 LAB — URINE CULTURE: Colony Count: >=100000

## 2016-05-14 ENCOUNTER — Other Ambulatory Visit: Payer: Self-pay | Admitting: Adult Health

## 2016-06-24 ENCOUNTER — Other Ambulatory Visit: Payer: Self-pay | Admitting: Adult Health

## 2017-03-03 ENCOUNTER — Telehealth: Payer: Self-pay | Admitting: Cardiology

## 2017-03-03 NOTE — Telephone Encounter (Signed)
error 

## 2017-06-17 ENCOUNTER — Encounter: Payer: Self-pay | Admitting: Adult Health

## 2017-11-08 ENCOUNTER — Encounter: Payer: Self-pay | Admitting: Adult Health

## 2017-11-08 ENCOUNTER — Ambulatory Visit (INDEPENDENT_AMBULATORY_CARE_PROVIDER_SITE_OTHER): Payer: Medicare Other | Admitting: Adult Health

## 2017-11-08 VITALS — BP 134/80 | Temp 98.1°F | Wt 152.0 lb

## 2017-11-08 DIAGNOSIS — R300 Dysuria: Secondary | ICD-10-CM | POA: Diagnosis not present

## 2017-11-08 DIAGNOSIS — J069 Acute upper respiratory infection, unspecified: Secondary | ICD-10-CM | POA: Diagnosis not present

## 2017-11-08 LAB — POC URINALSYSI DIPSTICK (AUTOMATED)
GLUCOSE UA: NEGATIVE
Spec Grav, UA: >=1.03 — AB (ref 1.010–1.025)
UROBILINOGEN UA: 0.2 U/dL
pH, UA: 5.5 (ref 5.0–8.0)

## 2017-11-08 MED ORDER — LEVOFLOXACIN 500 MG PO TABS: 500.0000 mg | ORAL_TABLET | Freq: Every day | ORAL | 0 refills | Status: DC

## 2017-11-08 NOTE — Progress Notes (Signed)
Subjective:    Patient ID: [name], female    DOB: May 08, 1956, 62 y.o.   MRN: [id number]  Urinary Tract Infection   This is a recurrent problem. The current episode started 1 to 4 weeks ago. The problem occurs every urination. The problem has been unchanged. The quality of the pain is described as burning. The pain is at a severity of 4/10. The pain is moderate. There has been no fever. There is no history of pyelonephritis. Associated symptoms include flank pain (right ), frequency, hematuria, hesitancy and urgency. Pertinent negatives include no chills, nausea or vomiting. She has tried nothing for the symptoms. Her past medical history is significant for recurrent UTIs.  URI   This is a recurrent problem. The current episode started 1 to 4 weeks ago. The problem has been rapidly worsening. There has been no fever. Associated symptoms include congestion, coughing (dry ), dysuria, headaches, rhinorrhea, sinus pain and wheezing. Pertinent negatives include no abdominal pain, ear pain, nausea, sore throat or vomiting. She has tried decongestant for the symptoms. The treatment provided no relief.      Review of Systems  Constitutional: Positive for activity change and fatigue. Negative for chills and fever.  HENT: Positive for congestion, postnasal drip, rhinorrhea, sinus pressure and sinus pain. Negative for ear pain, sore throat and trouble swallowing.   Respiratory: Positive for cough (dry ), shortness of breath and wheezing. Negative for chest tightness.   Cardiovascular: Negative.   Gastrointestinal: Negative for abdominal pain, nausea and vomiting.  Genitourinary: Positive for decreased urine volume, dysuria, flank pain (right ), frequency, hematuria, hesitancy, pelvic pain and urgency. Negative for vaginal bleeding and vaginal pain.  Neurological: Positive for headaches.   Past Medical History:  Diagnosis Date  . Acute MI, inferior wall, initial episode of care [facility]) 07/08/2012     Cath-07/08/11 -distal RCA stent DES (99%), LAD 20-30%. EF 50% inferior hypokinesis  (infarct)  . Anxiety   . Asthma   . Coronary artery disease   . Depression   . Diabetes mellitus   . Fibromyalgia   . Leukocytosis   . Monocytosis   . Peripheral arterial disease (HCC) 01/23/2013  . Tobacco use disorder 07/08/2012    Social History   Socioeconomic History  . Marital status: Married    Spouse name: Not on file  . Number of children: 3  . Years of education: college  . Highest education level: Not on file  Social Needs  . Financial resource strain: Not on file  . Food insecurity - worry: Not on file  . Food insecurity - inability: Not on file  . Transportation needs - medical: Not on file  . Transportation needs - non-medical: Not on file  Occupational History  . Not on file  Tobacco Use  . Smoking status: Current Every Day Smoker    Packs/day: 1.00    Years: 43.00    Pack years: 43.00    Types: Cigarettes  . Smokeless tobacco: Never Used  Substance and Sexual Activity  . Alcohol use: No    Alcohol/week: 0.0 oz    Comment: Rarely  . Drug use: No  . Sexual activity: Yes  Other Topics Concern  . Not on file  Social History Narrative   Lives with husband, married for 20 years.      They have three grown chlildren.   She is not currently working and is on disability   Highest level of education:  Engineer, maintenance (IT)   Pets: two  dogs and three rabbits.       Husband is a long Production assistant, radiohaul truck driver.       Is unable to enjoy any activities because of body pains.        Past Surgical History:  Procedure Laterality Date  . ABDOMINAL AORTAGRAM N/A 02/07/2013   Procedure: ABDOMINAL Ronny FlurryAORTAGRAM;  Surgeon: Iran OuchMuhammad A Arida, MD;  Location: [facility] CATH LAB;  Service: Cardiovascular;  Laterality: N/A;  . CARDIAC CATHETERIZATION    . CORONARY STENT PLACEMENT    . CYST REMOVAL TRUNK  1977   tailbone  . LEFT HEART CATHETERIZATION WITH CORONARY ANGIOGRAM N/A 07/07/2012   Procedure:  LEFT HEART CATHETERIZATION WITH CORONARY ANGIOGRAM;  Surgeon: Kathleene Hazelhristopher D McAlhany, MD;  [facility];  Service: Cardiovascular;  Laterality: N/A;  . PERCUTANEOUS CORONARY STENT INTERVENTION (PCI-S)  07/07/2012   Procedure: PERCUTANEOUS CORONARY STENT INTERVENTION (PCI-S);  Surgeon: Kathleene Hazelhristopher D McAlhany, MD;  [facility];  Service: Cardiovascular;;  . TONSILLECTOMY      Family History  Problem Relation Age of Onset  . Kidney disease Mother   . Diabetes Mother        DM Type II  . Alzheimer's disease Mother   . Diabetes Father        IDDM  . Heart disease Father   . Cancer Father        bone cancer died at 352  . Cancer Sister        bone cancer at age 60  . Multiple sclerosis Daughter   . Diabetes Son 2       DM Type I  . Heart disease Maternal Grandfather   . Heart disease Paternal Grandmother   . Diabetes Son [id number]       DM Type I    Allergies  Allergen Reactions  . Statins     SEVERE MUSCLE PAIN - failed lipitor, Livalo, red yeast rice (lovastatin), and she thinks Zocor and pravastatin  . Ace Inhibitors Cough    Patient refuses to take meds  . Cymbalta [Duloxetine Hcl]     Drowsiness    Current Outpatient Medications on File Prior to Visit  Medication Sig Dispense Refill  . amitriptyline (ELAVIL) 25 MG tablet Take 1 tablet (25 mg total) by mouth at bedtime. 30 tablet 3  . aspirin 81 MG tablet Take 1 tablet (81 mg total) by mouth 2 (two) times daily.    . busPIRone (BUSPAR) 15 MG tablet Take 2 tablets by mouth every morning and take one tablet every evening.    . Cholecalciferol (VITAMIN D3) 2000 units TABS Take 1 tablet by mouth daily.    . cilostazol (PLETAL) 100 MG tablet Take one-half tablet by mouth twice daily    . FLUoxetine (PROZAC) 20 MG capsule TAKE ONE CAPSULE BY MOUTH ONCE DAILY (Patient taking differently: TAKE TWO CAPSULES BY MOUTH ONCE DAILY) 30 capsule 5  . glipiZIDE (GLUCOTROL) 5 MG tablet Take one and one-half tablets by mouth twice  a day for diabetes.    . hydrochlorothiazide (HYDRODIURIL) 25 MG tablet Take 25 mg by mouth daily.    . isosorbide mononitrate (IMDUR) 60 MG 24 hr tablet Take 1.5 tablets (90 mg total) by mouth daily. (Patient taking differently: Take 60 mg by mouth daily. ) 135 tablet 3  . levETIRAcetam (KEPPRA) 250 MG tablet Take one-half tablet by mouth three times a day for one month.  Then take 1 tablet three times a day for suspected complex partial seizures.    .Marland Kitchen  metFORMIN (GLUCOPHAGE) 1000 MG tablet TAKE ONE TABLET BY MOUTH TWICE DAILY WITH MEALS 60 tablet 0  . pravastatin (PRAVACHOL) 40 MG tablet Take 40 mg by mouth at bedtime.    . pregabalin (LYRICA) 100 MG capsule Take 100 mg by mouth 2 (two) times daily.    . propranolol (INDERAL) 40 MG tablet TAKE ONE TABLET BY MOUTH TWICE DAILY 90 tablet 3  . nitroGLYCERIN (NITROSTAT) 0.4 MG SL tablet Place 1 tablet (0.4 mg total) under the tongue every 5 (five) minutes x 3 doses as needed for chest pain. (Patient not taking: Reported on 11/08/2017) 30 tablet 12   No current facility-administered medications on file prior to visit.     BP 134/80 (BP Location: Left Arm)   Temp 98.1 F (36.7 C) (Oral)   Wt 152 lb (68.9 kg)   BMI 26.50 kg/m       Objective:   Physical Exam  Constitutional: She is oriented to person, place, and time. She appears well-developed and well-nourished. No distress.  HENT:  Head: Normocephalic and atraumatic.  Right Ear: Hearing, tympanic membrane, external ear and ear canal normal.  Left Ear: Hearing, tympanic membrane, external ear and ear canal normal.  Nose: Right sinus exhibits maxillary sinus tenderness. Right sinus exhibits no frontal sinus tenderness. Left sinus exhibits maxillary sinus tenderness. Left sinus exhibits no frontal sinus tenderness.  Mouth/Throat: Uvula is midline, oropharynx is clear and moist and mucous membranes are normal.  Eyes: Conjunctivae and EOM are normal. Pupils are equal, round, and reactive to  light. Right eye exhibits no discharge.  Neck: Normal range of motion. Neck supple. No thyromegaly present.  Cardiovascular: Normal rate, regular rhythm, normal heart sounds and intact distal pulses. Exam reveals no gallop.  No murmur heard. Pulmonary/Chest: Effort normal and breath sounds normal. No respiratory distress. She has no wheezes. She has no rales. She exhibits no tenderness.  Abdominal: Soft. Normal appearance and bowel sounds are normal. She exhibits no mass. There is tenderness in the periumbilical area and suprapubic area. There is CVA tenderness (right ). There is no rebound and no guarding.  Lymphadenopathy:    She has no cervical adenopathy.  Neurological: She is alert and oriented to person, place, and time.  Skin: Skin is warm and dry. No rash noted. She is not diaphoretic. No erythema. No pallor.  Psychiatric: She has a normal mood and affect. Her behavior is normal. Thought content normal.  Nursing note and vitals reviewed.     Assessment & Plan:   1. Dysuria - Will treat with levaquin. She is being seen at the [facility] for her care now. I would like her to follow up with Urology at the [facility] for recurrent UTI's.  - POCT Urinalysis Dipstick (Automated) - Urine Culture - levofloxacin (LEVAQUIN) 500 MG tablet; Take 1 tablet (500 mg total) by mouth daily.  Dispense: 7 tablet; Refill: 0  2. Upper respiratory tract infection, unspecified type - Will treat with Levaquin  - levofloxacin (LEVAQUIN) 500 MG tablet; Take 1 tablet (500 mg total) by mouth daily.  Dispense: 7 tablet; Refill: 0  Shirline Frees, NP

## 2017-11-10 LAB — URINE CULTURE
MICRO NUMBER:: 90186955
SPECIMEN QUALITY:: ADEQUATE

## 2018-08-21 ENCOUNTER — Encounter (HOSPITAL_COMMUNITY): Payer: Self-pay | Admitting: Emergency Medicine

## 2018-08-21 ENCOUNTER — Emergency Department (HOSPITAL_COMMUNITY)
Admission: EM | Admit: 2018-08-21 | Discharge: 2018-08-22 | Disposition: A | Discharge status: Home / Self Care | Payer: Medicare Other | Attending: Emergency Medicine | Admitting: Emergency Medicine

## 2018-08-21 ENCOUNTER — Other Ambulatory Visit: Payer: Self-pay

## 2018-08-21 DIAGNOSIS — R739 Hyperglycemia, unspecified: Secondary | ICD-10-CM | POA: Diagnosis not present

## 2018-08-21 DIAGNOSIS — Z5321 Procedure and treatment not carried out due to patient leaving prior to being seen by health care provider: Secondary | ICD-10-CM | POA: Diagnosis not present

## 2018-08-21 DIAGNOSIS — R404 Transient alteration of awareness: Secondary | ICD-10-CM | POA: Diagnosis not present

## 2018-08-21 DIAGNOSIS — E1165 Type 2 diabetes mellitus with hyperglycemia: Secondary | ICD-10-CM | POA: Diagnosis not present

## 2018-08-21 LAB — CBC
HCT: 55.6 % — ABNORMAL HIGH (ref 36.0–46.0)
Hemoglobin: 18.4 g/dL — ABNORMAL HIGH (ref 12.0–15.0)
MCH: 30.6 pg (ref 26.0–34.0)
MCHC: 33.1 g/dL (ref 30.0–36.0)
MCV: 92.4 fL (ref 80.0–100.0)
NRBC: 0 % (ref 0.0–0.2)
Platelets: 333 10*3/uL (ref 150–400)
RBC: 6.02 MIL/uL — AB (ref 3.87–5.11)
RDW: 13.3 % (ref 11.5–15.5)
WBC: 25.9 10*3/uL — AB (ref 4.0–10.5)

## 2018-08-21 LAB — CBG MONITORING, ED: Glucose-Capillary: 460 mg/dL — ABNORMAL HIGH (ref 70–99)

## 2018-08-21 NOTE — ED Triage Notes (Signed)
Pt arrived via [facility] for C/O hyperglycemia and N/V since Wednesday. Pt denies pain.

## 2018-08-22 LAB — BASIC METABOLIC PANEL
ANION GAP: 22 — AB (ref 5–15)
BUN: 38 mg/dL — ABNORMAL HIGH (ref 6–20)
CO2: 12 mmol/L — AB (ref 22–32)
CREATININE: 1.09 mg/dL — AB (ref 0.44–1.00)
Calcium: 10.1 mg/dL (ref 8.9–10.3)
Chloride: 95 mmol/L — ABNORMAL LOW (ref 98–111)
GFR calc non Af Amer: 54 mL/min — ABNORMAL LOW (ref >60)
Glucose, Bld: 448 mg/dL — ABNORMAL HIGH (ref 70–99)
POTASSIUM: 2.5 mmol/L — AB (ref 3.5–5.1)
Sodium: 129 mmol/L — ABNORMAL LOW (ref 135–145)

## 2018-08-22 NOTE — ED Notes (Signed)
Spoke with pt's spouse, was told that pt was very sleepy.  Advised him that pt needed to be evaluated asap for abnormal lab work.  He verbalized understanding.

## 2018-08-22 NOTE — ED Notes (Signed)
Notified by lab that pt had critical Potassium of 2.5 and this was communicated to Dr Rhunette CroftNanavati. Pt had already left ED after triage but before being seen by EDP. Attempted to reach pt at home to notify her of results and need for further treatment but only got answering machine. Left message for pt to contact ED charge nurse regarding her recent labwork.

## 2018-08-22 NOTE — ED Notes (Signed)
CRITICAL VALUE ALERT  Critical Value: Potassium 2.5 Date & Time Notied:  08/22/18@0002  Provider Notified: Nanavati Orders Received/Actions taken: None yet

## 2018-08-23 DIAGNOSIS — Z6824 Body mass index (BMI) 24.0-24.9, adult: Secondary | ICD-10-CM | POA: Diagnosis not present

## 2018-08-23 DIAGNOSIS — R918 Other nonspecific abnormal finding of lung field: Secondary | ICD-10-CM | POA: Diagnosis not present

## 2018-08-23 DIAGNOSIS — Z79899 Other long term (current) drug therapy: Secondary | ICD-10-CM | POA: Diagnosis not present

## 2018-08-23 DIAGNOSIS — Z9889 Other specified postprocedural states: Secondary | ICD-10-CM | POA: Diagnosis not present

## 2018-08-23 DIAGNOSIS — E11649 Type 2 diabetes mellitus with hypoglycemia without coma: Secondary | ICD-10-CM | POA: Diagnosis not present

## 2018-08-23 DIAGNOSIS — R630 Anorexia: Secondary | ICD-10-CM | POA: Diagnosis present

## 2018-08-23 DIAGNOSIS — J168 Pneumonia due to other specified infectious organisms: Secondary | ICD-10-CM | POA: Diagnosis not present

## 2018-08-23 DIAGNOSIS — I739 Peripheral vascular disease, unspecified: Secondary | ICD-10-CM | POA: Diagnosis not present

## 2018-08-23 DIAGNOSIS — E1110 Type 2 diabetes mellitus with ketoacidosis without coma: Secondary | ICD-10-CM | POA: Diagnosis present

## 2018-08-23 DIAGNOSIS — R531 Weakness: Secondary | ICD-10-CM | POA: Diagnosis not present

## 2018-08-23 DIAGNOSIS — M797 Fibromyalgia: Secondary | ICD-10-CM | POA: Diagnosis present

## 2018-08-23 DIAGNOSIS — D72829 Elevated white blood cell count, unspecified: Secondary | ICD-10-CM | POA: Diagnosis not present

## 2018-08-23 DIAGNOSIS — E785 Hyperlipidemia, unspecified: Secondary | ICD-10-CM | POA: Diagnosis present

## 2018-08-23 DIAGNOSIS — E1142 Type 2 diabetes mellitus with diabetic polyneuropathy: Secondary | ICD-10-CM | POA: Diagnosis present

## 2018-08-23 DIAGNOSIS — J181 Lobar pneumonia, unspecified organism: Secondary | ICD-10-CM | POA: Diagnosis not present

## 2018-08-23 DIAGNOSIS — E1165 Type 2 diabetes mellitus with hyperglycemia: Secondary | ICD-10-CM | POA: Diagnosis not present

## 2018-08-23 DIAGNOSIS — R350 Frequency of micturition: Secondary | ICD-10-CM | POA: Diagnosis present

## 2018-08-23 DIAGNOSIS — R300 Dysuria: Secondary | ICD-10-CM | POA: Diagnosis present

## 2018-08-23 DIAGNOSIS — F329 Major depressive disorder, single episode, unspecified: Secondary | ICD-10-CM | POA: Diagnosis present

## 2018-08-23 DIAGNOSIS — G4733 Obstructive sleep apnea (adult) (pediatric): Secondary | ICD-10-CM | POA: Diagnosis present

## 2018-08-23 DIAGNOSIS — E1151 Type 2 diabetes mellitus with diabetic peripheral angiopathy without gangrene: Secondary | ICD-10-CM | POA: Diagnosis not present

## 2018-08-23 DIAGNOSIS — Z7984 Long term (current) use of oral hypoglycemic drugs: Secondary | ICD-10-CM | POA: Diagnosis not present

## 2018-08-23 DIAGNOSIS — E876 Hypokalemia: Secondary | ICD-10-CM | POA: Diagnosis not present

## 2018-08-23 DIAGNOSIS — J159 Unspecified bacterial pneumonia: Secondary | ICD-10-CM | POA: Diagnosis not present

## 2018-08-23 DIAGNOSIS — Z881 Allergy status to other antibiotic agents status: Secondary | ICD-10-CM | POA: Diagnosis not present

## 2018-08-23 DIAGNOSIS — J189 Pneumonia, unspecified organism: Secondary | ICD-10-CM | POA: Diagnosis not present

## 2018-08-23 DIAGNOSIS — Z87891 Personal history of nicotine dependence: Secondary | ICD-10-CM | POA: Diagnosis not present

## 2018-08-23 DIAGNOSIS — I10 Essential (primary) hypertension: Secondary | ICD-10-CM | POA: Diagnosis present

## 2018-08-23 DIAGNOSIS — Z9989 Dependence on other enabling machines and devices: Secondary | ICD-10-CM | POA: Diagnosis not present

## 2018-08-23 DIAGNOSIS — I2510 Atherosclerotic heart disease of native coronary artery without angina pectoris: Secondary | ICD-10-CM | POA: Diagnosis present

## 2018-08-24 DIAGNOSIS — E1110 Type 2 diabetes mellitus with ketoacidosis without coma: Secondary | ICD-10-CM | POA: Diagnosis not present

## 2018-08-25 DIAGNOSIS — E1110 Type 2 diabetes mellitus with ketoacidosis without coma: Secondary | ICD-10-CM | POA: Diagnosis not present

## 2018-08-26 DIAGNOSIS — E1110 Type 2 diabetes mellitus with ketoacidosis without coma: Secondary | ICD-10-CM | POA: Diagnosis not present

## 2018-08-26 DIAGNOSIS — J189 Pneumonia, unspecified organism: Secondary | ICD-10-CM | POA: Diagnosis not present

## 2018-08-27 DIAGNOSIS — E1110 Type 2 diabetes mellitus with ketoacidosis without coma: Secondary | ICD-10-CM | POA: Diagnosis not present

## 2018-08-28 DIAGNOSIS — E1110 Type 2 diabetes mellitus with ketoacidosis without coma: Secondary | ICD-10-CM | POA: Diagnosis not present

## 2020-01-22 ENCOUNTER — Other Ambulatory Visit: Payer: Self-pay

## 2020-01-22 ENCOUNTER — Ambulatory Visit
Admission: EM | Admit: 2020-01-22 | Discharge: 2020-01-22 | Disposition: A | Discharge status: Home / Self Care | Payer: Medicare HMO | Attending: Emergency Medicine | Admitting: Emergency Medicine

## 2020-01-22 DIAGNOSIS — Z955 Presence of coronary angioplasty implant and graft: Secondary | ICD-10-CM | POA: Insufficient documentation

## 2020-01-22 DIAGNOSIS — R05 Cough: Secondary | ICD-10-CM | POA: Diagnosis not present

## 2020-01-22 DIAGNOSIS — J45909 Unspecified asthma, uncomplicated: Secondary | ICD-10-CM | POA: Insufficient documentation

## 2020-01-22 DIAGNOSIS — F419 Anxiety disorder, unspecified: Secondary | ICD-10-CM | POA: Insufficient documentation

## 2020-01-22 DIAGNOSIS — Z7984 Long term (current) use of oral hypoglycemic drugs: Secondary | ICD-10-CM | POA: Insufficient documentation

## 2020-01-22 DIAGNOSIS — R0981 Nasal congestion: Secondary | ICD-10-CM | POA: Diagnosis present

## 2020-01-22 DIAGNOSIS — Z888 Allergy status to other drugs, medicaments and biological substances status: Secondary | ICD-10-CM | POA: Insufficient documentation

## 2020-01-22 DIAGNOSIS — Z79899 Other long term (current) drug therapy: Secondary | ICD-10-CM | POA: Insufficient documentation

## 2020-01-22 DIAGNOSIS — E1151 Type 2 diabetes mellitus with diabetic peripheral angiopathy without gangrene: Secondary | ICD-10-CM | POA: Insufficient documentation

## 2020-01-22 DIAGNOSIS — Z7982 Long term (current) use of aspirin: Secondary | ICD-10-CM | POA: Diagnosis not present

## 2020-01-22 DIAGNOSIS — I2510 Atherosclerotic heart disease of native coronary artery without angina pectoris: Secondary | ICD-10-CM | POA: Diagnosis not present

## 2020-01-22 DIAGNOSIS — J0100 Acute maxillary sinusitis, unspecified: Secondary | ICD-10-CM | POA: Insufficient documentation

## 2020-01-22 DIAGNOSIS — F17210 Nicotine dependence, cigarettes, uncomplicated: Secondary | ICD-10-CM | POA: Insufficient documentation

## 2020-01-22 LAB — POCT URINALYSIS DIP (MANUAL ENTRY)
Bilirubin, UA: NEGATIVE
Blood, UA: NEGATIVE
Glucose, UA: NEGATIVE mg/dL
Ketones, POC UA: NEGATIVE mg/dL
Leukocytes, UA: NEGATIVE
Nitrite, UA: POSITIVE — AB
Spec Grav, UA: >=1.03 — AB (ref 1.010–1.025)
Urobilinogen, UA: 0.2 E.U./dL
pH, UA: 5.5 (ref 5.0–8.0)

## 2020-01-22 MED ORDER — PREDNISONE 20 MG PO TABS
20.0000 mg | ORAL_TABLET | Freq: Two times a day (BID) | ORAL | 0 refills | Status: AC
Start: 2020-01-22 — End: 2020-01-27

## 2020-01-22 MED ORDER — LEVOFLOXACIN 750 MG PO TABS: 750.0000 mg | ORAL_TABLET | Freq: Every day | ORAL | 0 refills | Status: AC

## 2020-01-22 NOTE — ED Triage Notes (Addendum)
Pt presents with multiple complaints. States that she has had infections that will not go away for over a year. Reports tenderness and redness to her toes. Pt states she is concerned for her sinuses. Reports that she has dental problems and that her dental pain goes down into her neck. States she is going to the dentist about this. She reports that she is concerned for a bladder infection. Reports she has dysuria, malodorous urine and it has a "weird" color to it. States she has had those symptoms for "two plus years". Pt appears to be sluggish during patient intake. Patient reports she has been very tired for days.

## 2020-01-22 NOTE — ED Provider Notes (Signed)
[facility] CARE CENTER   [id number] 01/22/20 Arrival Time: 1631   CC: multiple complaints; sinus pain  SUBJECTIVE: History from: patient. Patient with multiple points, but most concerned for sinus symptoms.    Arline Billey Gosling is a 64 y.o. female who presents with maxillary sinus pain/ pressure, nasal congestion, and cough x 1.5 weeks.  Denies sick exposure to COVID, flu or strep.  Denies recent travel.  Has tried multiple OTC medications without relief.  Symptoms are made worse with leaning forward.  Reports previous symptoms in the past with sinus infection.   Denies fever, chills, fatigue, rhinorrhea, sore throat, SOB, wheezing, chest pain, nausea, vomiting.    ROS: As per HPI.  All other pertinent ROS negative.     Past Medical History:  Diagnosis Date  . Acute MI, inferior wall, initial episode of care [facility]) 07/08/2012   Cath-07/08/11 -distal RCA stent DES (99%), LAD 20-30%. EF 50% inferior hypokinesis  (infarct)  . Anxiety   . Asthma   . Coronary artery disease   . Depression   . Diabetes mellitus   . Fibromyalgia   . Leukocytosis   . Monocytosis   . Peripheral arterial disease (HCC) 01/23/2013  . Tobacco use disorder 07/08/2012   Past Surgical History:  Procedure Laterality Date  . ABDOMINAL AORTAGRAM N/A 02/07/2013   Procedure: ABDOMINAL Ronny Flurry;  Surgeon: Iran Ouch, MD;  Location: [facility] CATH LAB;  Service: Cardiovascular;  Laterality: N/A;  . CARDIAC CATHETERIZATION    . CORONARY STENT PLACEMENT    . CYST REMOVAL TRUNK  1977   tailbone  . LEFT HEART CATHETERIZATION WITH CORONARY ANGIOGRAM N/A 07/07/2012   Procedure: LEFT HEART CATHETERIZATION WITH CORONARY ANGIOGRAM;  Surgeon: Kathleene Hazel, MD;  Location: [facility] CATH LAB;  Service: Cardiovascular;  Laterality: N/A;  . PERCUTANEOUS CORONARY STENT INTERVENTION (PCI-S)  07/07/2012   Procedure: PERCUTANEOUS CORONARY STENT INTERVENTION (PCI-S);  Surgeon: Kathleene Hazel, MD;  Location: [facility] CATH LAB;   Service: Cardiovascular;;  . TONSILLECTOMY     Allergies  Allergen Reactions  . Statins     SEVERE MUSCLE PAIN - failed lipitor, Livalo, red yeast rice (lovastatin), and she thinks Zocor and pravastatin  . Ace Inhibitors Cough    Patient refuses to take meds  . Cymbalta [Duloxetine Hcl]     Drowsiness   No current facility-administered medications on file prior to encounter.   Current Outpatient Medications on File Prior to Encounter  Medication Sig Dispense Refill  . amitriptyline (ELAVIL) 25 MG tablet Take 1 tablet (25 mg total) by mouth at bedtime. 30 tablet 3  . aspirin 81 MG tablet Take 1 tablet (81 mg total) by mouth 2 (two) times daily.    . busPIRone (BUSPAR) 15 MG tablet Take 2 tablets by mouth every morning and take one tablet every evening.    . Cholecalciferol (VITAMIN D3) 2000 units TABS Take 1 tablet by mouth daily.    . cilostazol (PLETAL) 100 MG tablet Take one-half tablet by mouth twice daily    . FLUoxetine (PROZAC) 20 MG capsule TAKE ONE CAPSULE BY MOUTH ONCE DAILY (Patient taking differently: TAKE TWO CAPSULES BY MOUTH ONCE DAILY) 30 capsule 5  . glipiZIDE (GLUCOTROL) 5 MG tablet Take one and one-half tablets by mouth twice a day for diabetes.    . hydrochlorothiazide (HYDRODIURIL) 25 MG tablet Take 25 mg by mouth daily.    . isosorbide mononitrate (IMDUR) 60 MG 24 hr tablet Take 1.5 tablets (90 mg total) by mouth daily. (Patient taking differently:  Take 60 mg by mouth daily. ) 135 tablet 3  . levETIRAcetam (KEPPRA) 250 MG tablet Take one-half tablet by mouth three times a day for one month.  Then take 1 tablet three times a day for suspected complex partial seizures.    . metFORMIN (GLUCOPHAGE) 1000 MG tablet TAKE ONE TABLET BY MOUTH TWICE DAILY WITH MEALS 60 tablet 0  . pravastatin (PRAVACHOL) 40 MG tablet Take 40 mg by mouth at bedtime.    . pregabalin (LYRICA) 100 MG capsule Take 100 mg by mouth 2 (two) times daily.    . propranolol (INDERAL) 40 MG tablet TAKE ONE  TABLET BY MOUTH TWICE DAILY 90 tablet 3  . nitroGLYCERIN (NITROSTAT) 0.4 MG SL tablet Place 1 tablet (0.4 mg total) under the tongue every 5 (five) minutes x 3 doses as needed for chest pain. (Patient not taking: Reported on 11/08/2017) 30 tablet 12   Social History   Socioeconomic History  . Marital status: Married    Spouse name: Not on file  . Number of children: 3  . Years of education: college  . Highest education level: Not on file  Occupational History  . Not on file  Tobacco Use  . Smoking status: Current Every Day Smoker    Packs/day: 1.00    Years: 43.00    Pack years: 43.00    Types: Cigarettes  . Smokeless tobacco: Never Used  Substance and Sexual Activity  . Alcohol use: No    Alcohol/week: 0.0 standard drinks    Comment: Rarely  . Drug use: No  . Sexual activity: Yes  Other Topics Concern  . Not on file  Social History Narrative   Lives with husband, married for 20 years.      They have three grown chlildren.   She is not currently working and is on disability   Highest level of education:  Forensic psychologist   Pets: two dogs and three rabbits.       Husband is a long Associate Professor.       Is unable to enjoy any activities because of body pains.       Social Determinants of Health   Financial Resource Strain:   . Difficulty of Paying Living Expenses:   Food Insecurity:   . Worried About Charity fundraiser in the Last Year:   . Arboriculturist in the Last Year:   Transportation Needs:   . Film/video editor (Medical):   Marland Kitchen Lack of Transportation (Non-Medical):   Physical Activity:   . Days of Exercise per Week:   . Minutes of Exercise per Session:   Stress:   . Feeling of Stress :   Social Connections:   . Frequency of Communication with Friends and Family:   . Frequency of Social Gatherings with Friends and Family:   . Attends Religious Services:   . Active Member of Clubs or Organizations:   . Attends Archivist Meetings:   Marland Kitchen  Marital Status:   Intimate Partner Violence:   . Fear of Current or Ex-Partner:   . Emotionally Abused:   Marland Kitchen Physically Abused:   . Sexually Abused:    Family History  Problem Relation Age of Onset  . Kidney disease Mother   . Diabetes Mother        DM Type II  . Alzheimer's disease Mother   . Diabetes Father        IDDM  . Heart disease Father   .  Cancer Father        bone cancer died at 51  . Cancer Sister        bone cancer at age 73  . Multiple sclerosis Daughter   . Diabetes Son 2       DM Type I  . Heart disease Maternal Grandfather   . Heart disease Paternal Grandmother   . Diabetes Son 13       DM Type I    OBJECTIVE:  Vitals:   01/22/20 1653 01/22/20 1657  BP: (!) 86/64 96/67  Pulse: 89   Resp: 18   Temp: 98.4 F (36.9 C)   SpO2: 94%      General appearance: alert; appears fatigued, but nontoxic; speaking in full sentences and tolerating own secretions HEENT: NCAT; Ears: EACs clear, TMs pearly gray; Eyes: PERRL.  EOM grossly intact. Sinuses: TTP over maxillary sinuses; Nose: nares patent without rhinorrhea, Throat: oropharynx clear, tonsils non erythematous or enlarged, uvula midline  Neck: supple without LAD Lungs: unlabored respirations, symmetrical air entry; cough: absent; no respiratory distress; CTAB Heart: regular rate and rhythm.   Skin: warm and dry Psychological: alert and cooperative; normal mood and affect  ASSESSMENT & PLAN:  1. Acute non-recurrent maxillary sinusitis     Meds ordered this encounter  Medications  . levofloxacin (LEVAQUIN) 750 MG tablet    Sig: Take 1 tablet (750 mg total) by mouth daily for 5 days.    Dispense:  5 tablet    Refill:  0    Order Specific Question:   Supervising Provider    Answer:   Eustace Moore [[id number]]  . predniSONE (DELTASONE) 20 MG tablet    Sig: Take 1 tablet (20 mg total) by mouth 2 (two) times daily with a meal for 5 days.    Dispense:  10 tablet    Refill:  0    Order Specific  Question:   Supervising Provider    Answer:   Eustace Moore [[id number]]    Rest and push fluids Reports adverse reactions to penicillin and doxycycline Levaquin prescribed.  Take as directed and to completion.  DO NOT TAKE WITH GLIPIZIDE AS THIS MAY CAUSE YOUR SUGAR TO DROP TO QUICKLY Prednisone prescribed.  Take as directed and to completion Use OTC ibuprofen/tylenol as needed for pain Follow up with PCP for further evaluation and management Return or go to the ED if you have any new or worsening symptoms such as fever, chills, worsening sinus pain/pressure, cough, sore throat, chest pain, shortness of breath, abdominal pain, changes in bowel or bladder habits, etc...   Urine culture sent.  WE will follow up with you regarding abnormal results.    Reviewed expectations re: course of current medical issues. Questions answered. Outlined signs and symptoms indicating need for more acute intervention. Patient verbalized understanding. After Visit Summary given.         Rennis Harding, PA-C 01/22/20 1730

## 2020-01-22 NOTE — Discharge Instructions (Signed)
  Rest and push fluids Reports adverse reactions to penicillin and doxycycline Levaquin prescribed.  Take as directed and to completion.  DO NOT TAKE WITH GLIPIZIDE AS THIS MAY CAUSE YOUR SUGAR TO DROP TO QUICKLY Prednisone prescribed.  Take as directed and to completion Use OTC ibuprofen/tylenol as needed for pain Follow up with PCP for further evaluation and management Return or go to the ED if you have any new or worsening symptoms such as fever, chills, worsening sinus pain/pressure, cough, sore throat, chest pain, shortness of breath, abdominal pain, changes in bowel or bladder habits, etc..Marland Kitchen

## 2020-01-23 LAB — URINE CULTURE: Special Requests: NORMAL

## 2020-03-18 ENCOUNTER — Ambulatory Visit
Admission: EM | Admit: 2020-03-18 | Discharge: 2020-03-18 | Disposition: A | Discharge status: Home / Self Care | Payer: Medicare HMO | Attending: Emergency Medicine | Admitting: Emergency Medicine

## 2020-03-18 ENCOUNTER — Other Ambulatory Visit: Payer: Self-pay

## 2020-03-18 DIAGNOSIS — K05219 Aggressive periodontitis, localized, unspecified severity: Secondary | ICD-10-CM | POA: Diagnosis not present

## 2020-03-18 DIAGNOSIS — K047 Periapical abscess without sinus: Secondary | ICD-10-CM

## 2020-03-18 DIAGNOSIS — K1379 Other lesions of oral mucosa: Secondary | ICD-10-CM

## 2020-03-18 MED ORDER — DEXAMETHASONE SODIUM PHOSPHATE 10 MG/ML IJ SOLN
10.0000 mg | Freq: Once | INTRAMUSCULAR | Status: AC
  Administered 2020-03-18: 10 mg via INTRAMUSCULAR

## 2020-03-18 MED ORDER — CEFTRIAXONE SODIUM 1 G IJ SOLR
1.0000 g | Freq: Once | INTRAMUSCULAR | Status: AC
  Administered 2020-03-18: 1 g via INTRAMUSCULAR

## 2020-03-18 MED ORDER — PREDNISONE 20 MG PO TABS
20.0000 mg | ORAL_TABLET | Freq: Two times a day (BID) | ORAL | 0 refills | Status: AC
Start: 2020-03-18 — End: 2020-03-23

## 2020-03-18 MED ORDER — METRONIDAZOLE 500 MG PO TABS
500.0000 mg | ORAL_TABLET | Freq: Two times a day (BID) | ORAL | 0 refills | Status: AC
Start: 2020-03-18 — End: 2020-03-28

## 2020-03-18 MED ORDER — CEFUROXIME AXETIL 250 MG PO TABS: 250.0000 mg | ORAL_TABLET | Freq: Two times a day (BID) | ORAL | 0 refills | Status: AC

## 2020-03-18 NOTE — ED Provider Notes (Signed)
[facility] CARE CENTER   [id number] 03/18/20 Arrival Time: 1619  CC: DENTAL PAIN  SUBJECTIVE:  [name] is a 64 y.o. female who presents with dental pain and facial swelling x 1 day.  Denies a precipitating event or trauma.  Localizes pain to RT lower jaw.  Has tried OTC analgesics without relief.  Worse with chewing.  Has appt with dentist in 1 weeks.  Reports similar symptoms in the past that improved with augmentin and prednisone.  Denies fever, chills, dysphagia, odynophagia, neck swelling, nausea, vomiting, chest pain, SOB.    ROS: As per HPI.  All other pertinent ROS negative.     Past Medical History:  Diagnosis Date  . Acute MI, inferior wall, initial episode of care [facility]) 07/08/2012   Cath-07/08/11 -distal RCA stent DES (99%), LAD 20-30%. EF 50% inferior hypokinesis  (infarct)  . Anxiety   . Asthma   . Coronary artery disease   . Depression   . Diabetes mellitus   . Fibromyalgia   . Leukocytosis   . Monocytosis   . Peripheral arterial disease (HCC) 01/23/2013  . Tobacco use disorder 07/08/2012   Past Surgical History:  Procedure Laterality Date  . ABDOMINAL AORTAGRAM N/A 02/07/2013   Procedure: ABDOMINAL Ronny Flurry;  Surgeon: Iran Ouch, MD;  Location: [facility] CATH LAB;  Service: Cardiovascular;  Laterality: N/A;  . CARDIAC CATHETERIZATION    . CORONARY STENT PLACEMENT    . CYST REMOVAL TRUNK  1977   tailbone  . LEFT HEART CATHETERIZATION WITH CORONARY ANGIOGRAM N/A 07/07/2012   Procedure: LEFT HEART CATHETERIZATION WITH CORONARY ANGIOGRAM;  Surgeon: Kathleene Hazel, MD;  Location: [facility] CATH LAB;  Service: Cardiovascular;  Laterality: N/A;  . PERCUTANEOUS CORONARY STENT INTERVENTION (PCI-S)  07/07/2012   Procedure: PERCUTANEOUS CORONARY STENT INTERVENTION (PCI-S);  Surgeon: Kathleene Hazel, MD;  Location: [facility] CATH LAB;  Service: Cardiovascular;;  . TONSILLECTOMY     Allergies  Allergen Reactions  . Statins     SEVERE MUSCLE PAIN - failed lipitor,  Livalo, red yeast rice (lovastatin), and she thinks Zocor and pravastatin  . Ace Inhibitors Cough    Patient refuses to take meds  . Cymbalta [Duloxetine Hcl]     Drowsiness   No current facility-administered medications on file prior to encounter.   Current Outpatient Medications on File Prior to Encounter  Medication Sig Dispense Refill  . amitriptyline (ELAVIL) 25 MG tablet Take 1 tablet (25 mg total) by mouth at bedtime. 30 tablet 3  . aspirin 81 MG tablet Take 1 tablet (81 mg total) by mouth 2 (two) times daily.    . busPIRone (BUSPAR) 15 MG tablet Take 2 tablets by mouth every morning and take one tablet every evening.    . Cholecalciferol (VITAMIN D3) 2000 units TABS Take 1 tablet by mouth daily.    . cilostazol (PLETAL) 100 MG tablet Take one-half tablet by mouth twice daily    . FLUoxetine (PROZAC) 20 MG capsule TAKE ONE CAPSULE BY MOUTH ONCE DAILY (Patient taking differently: TAKE TWO CAPSULES BY MOUTH ONCE DAILY) 30 capsule 5  . glipiZIDE (GLUCOTROL) 5 MG tablet Take one and one-half tablets by mouth twice a day for diabetes.    . hydrochlorothiazide (HYDRODIURIL) 25 MG tablet Take 25 mg by mouth daily.    . isosorbide mononitrate (IMDUR) 60 MG 24 hr tablet Take 1.5 tablets (90 mg total) by mouth daily. (Patient taking differently: Take 60 mg by mouth daily. ) 135 tablet 3  . levETIRAcetam (KEPPRA) 250 MG  tablet Take one-half tablet by mouth three times a day for one month.  Then take 1 tablet three times a day for suspected complex partial seizures.    . metFORMIN (GLUCOPHAGE) 1000 MG tablet TAKE ONE TABLET BY MOUTH TWICE DAILY WITH MEALS 60 tablet 0  . nitroGLYCERIN (NITROSTAT) 0.4 MG SL tablet Place 1 tablet (0.4 mg total) under the tongue every 5 (five) minutes x 3 doses as needed for chest pain. (Patient not taking: Reported on 11/08/2017) 30 tablet 12  . pravastatin (PRAVACHOL) 40 MG tablet Take 40 mg by mouth at bedtime.    . pregabalin (LYRICA) 100 MG capsule Take 100 mg by  mouth 2 (two) times daily.    . propranolol (INDERAL) 40 MG tablet TAKE ONE TABLET BY MOUTH TWICE DAILY 90 tablet 3   Social History   Socioeconomic History  . Marital status: Married    Spouse name: Not on file  . Number of children: 3  . Years of education: college  . Highest education level: Not on file  Occupational History  . Not on file  Tobacco Use  . Smoking status: Current Every Day Smoker    Packs/day: 1.00    Years: 43.00    Pack years: 43.00    Types: Cigarettes  . Smokeless tobacco: Never Used  Substance and Sexual Activity  . Alcohol use: No    Alcohol/week: 0.0 standard drinks    Comment: Rarely  . Drug use: No  . Sexual activity: Yes  Other Topics Concern  . Not on file  Social History Narrative   Lives with husband, married for 20 years.      They have three grown chlildren.   She is not currently working and is on disability   Highest level of education:  Forensic psychologist   Pets: two dogs and three rabbits.       Husband is a long Associate Professor.       Is unable to enjoy any activities because of body pains.       Social Determinants of Health   Financial Resource Strain:   . Difficulty of Paying Living Expenses:   Food Insecurity:   . Worried About Charity fundraiser in the Last Year:   . Arboriculturist in the Last Year:   Transportation Needs:   . Film/video editor (Medical):   Marland Kitchen Lack of Transportation (Non-Medical):   Physical Activity:   . Days of Exercise per Week:   . Minutes of Exercise per Session:   Stress:   . Feeling of Stress :   Social Connections:   . Frequency of Communication with Friends and Family:   . Frequency of Social Gatherings with Friends and Family:   . Attends Religious Services:   . Active Member of Clubs or Organizations:   . Attends Archivist Meetings:   Marland Kitchen Marital Status:   Intimate Partner Violence:   . Fear of Current or Ex-Partner:   . Emotionally Abused:   Marland Kitchen Physically Abused:    . Sexually Abused:    Family History  Problem Relation Age of Onset  . Kidney disease Mother   . Diabetes Mother        DM Type II  . Alzheimer's disease Mother   . Diabetes Father        IDDM  . Heart disease Father   . Cancer Father        bone cancer died at 69  .  Cancer Sister        bone cancer at age 10  . Multiple sclerosis Daughter   . Diabetes Son 2       DM Type I  . Heart disease Maternal Grandfather   . Heart disease Paternal Grandmother   . Diabetes Son 69       DM Type I    OBJECTIVE:  Vitals:   03/18/20 1659  BP: (!) 154/89  Pulse: (!) 110  Resp: 18  Temp: 97.7 F (36.5 C)  SpO2: 94%    General appearance: alert; no distress HENT: normocephalic; atraumatic; EACs clear; RT lower jaw with swelling; dentition: multiple carries; abscess present- right lower gums  Neck: supple without LAD Lungs: normal respirations Skin: warm and dry Psychological: alert and cooperative; normal mood and affect  ASSESSMENT & PLAN:  1. Gum abscess   2. Oral pain   3. Dental infection     Meds ordered this encounter  Medications  . metroNIDAZOLE (FLAGYL) 500 MG tablet    Sig: Take 1 tablet (500 mg total) by mouth 2 (two) times daily for 10 days.    Dispense:  20 tablet    Refill:  0    Order Specific Question:   Supervising Provider    Answer:   Eustace Moore [[id number]]  . predniSONE (DELTASONE) 20 MG tablet    Sig: Take 1 tablet (20 mg total) by mouth 2 (two) times daily with a meal for 5 days.    Dispense:  10 tablet    Refill:  0    Order Specific Question:   Supervising Provider    Answer:   Eustace Moore [[id number]]  . cefUROXime (CEFTIN) 250 MG tablet    Sig: Take 1 tablet (250 mg total) by mouth 2 (two) times daily with a meal for 10 days.    Dispense:  20 tablet    Refill:  0    Order Specific Question:   Supervising Provider    Answer:   Eustace Moore [[id number]]   1 gram of rocephin given in office Decadron give in office Prednisone  prescribed.   Levofloxacin and metronidazole prescribed.  Take as directed and to completion Maintain oral hygiene care Follow up with dentist as soon as possible for further evaluation and treatment  Return or go to the ED if you have any new or worsening symptoms such as fever, chills, difficulty swallowing, painful swallowing, oral or neck swelling, nausea, vomiting, chest pain, SOB, etc...  Reviewed expectations re: course of current medical issues. Questions answered. Outlined signs and symptoms indicating need for more acute intervention. Patient verbalized understanding. After Visit Summary given.   Rennis Harding, PA-C 03/18/20 1718

## 2020-03-18 NOTE — ED Triage Notes (Signed)
Pt presents with right dental abscess and right facial swelling , completed Augmentin 2 weeks ago for same, states has appointment on 7/2 for extraction

## 2020-03-18 NOTE — Discharge Instructions (Signed)
1 gram of rocephin given in office Decadron give in office Prednisone prescribed.   Levofloxacin and metronidazole prescribed.  Take as directed and to completion Maintain oral hygiene care Follow up with dentist as soon as possible for further evaluation and treatment  Return or go to the ED if you have any new or worsening symptoms such as fever, chills, difficulty swallowing, painful swallowing, oral or neck swelling, nausea, vomiting, chest pain, SOB, etc..Marland Kitchen

## 2020-04-04 ENCOUNTER — Ambulatory Visit (INDEPENDENT_AMBULATORY_CARE_PROVIDER_SITE_OTHER): Payer: Medicare HMO | Admitting: Adult Health

## 2020-04-04 ENCOUNTER — Other Ambulatory Visit: Payer: Self-pay

## 2020-04-04 ENCOUNTER — Encounter: Payer: Self-pay | Admitting: Adult Health

## 2020-04-04 DIAGNOSIS — I739 Peripheral vascular disease, unspecified: Secondary | ICD-10-CM | POA: Diagnosis not present

## 2020-04-04 DIAGNOSIS — E782 Mixed hyperlipidemia: Secondary | ICD-10-CM

## 2020-04-04 DIAGNOSIS — F329 Major depressive disorder, single episode, unspecified: Secondary | ICD-10-CM

## 2020-04-04 DIAGNOSIS — M797 Fibromyalgia: Secondary | ICD-10-CM

## 2020-04-04 DIAGNOSIS — R569 Unspecified convulsions: Secondary | ICD-10-CM | POA: Diagnosis not present

## 2020-04-04 DIAGNOSIS — F32A Depression, unspecified: Secondary | ICD-10-CM

## 2020-04-04 DIAGNOSIS — E1165 Type 2 diabetes mellitus with hyperglycemia: Secondary | ICD-10-CM

## 2020-04-04 DIAGNOSIS — I10 Essential (primary) hypertension: Secondary | ICD-10-CM

## 2020-04-04 DIAGNOSIS — I2510 Atherosclerotic heart disease of native coronary artery without angina pectoris: Secondary | ICD-10-CM | POA: Diagnosis not present

## 2020-04-04 MED ORDER — PROPRANOLOL HCL 40 MG PO TABS: 40.0000 mg | ORAL_TABLET | Freq: Two times a day (BID) | ORAL | 0 refills | Status: DC

## 2020-04-04 MED ORDER — FLUOXETINE HCL 40 MG PO CAPS: 40.0000 mg | ORAL_CAPSULE | Freq: Every day | ORAL | 0 refills | Status: DC

## 2020-04-04 MED ORDER — FLUCONAZOLE 150 MG PO TABS: 150.0000 mg | ORAL_TABLET | Freq: Once | ORAL | 0 refills | Status: DC

## 2020-04-04 MED ORDER — PREGABALIN 100 MG PO CAPS: 100.0000 mg | ORAL_CAPSULE | Freq: Two times a day (BID) | ORAL | 0 refills | Status: DC

## 2020-04-04 MED ORDER — FREESTYLE LIBRE 14 DAY SENSOR MISC: 3 refills | Status: DC

## 2020-04-04 MED ORDER — INSULIN PEN NEEDLE 29G X 12.7MM MISC: 1 refills | Status: DC

## 2020-04-04 MED ORDER — CILOSTAZOL 100 MG PO TABS: 50.0000 mg | ORAL_TABLET | Freq: Two times a day (BID) | ORAL | 0 refills | Status: DC

## 2020-04-04 MED ORDER — FREESTYLE LIBRE 14 DAY READER DEVI: 0 refills | Status: DC

## 2020-04-04 MED ORDER — LEVETIRACETAM 250 MG PO TABS: 250.0000 mg | ORAL_TABLET | Freq: Three times a day (TID) | ORAL | 0 refills | Status: DC

## 2020-04-04 NOTE — Progress Notes (Signed)
Subjective:    Patient ID: [name], female    DOB: Apr 28, 1956, 64 y.o.   MRN: [id number]  HPI 64 year old female who  has a past medical history of Acute MI, inferior wall, initial episode of care [facility]) (07/08/2012), Anxiety, Asthma, Coronary artery disease, Depression, Diabetes mellitus, Fibromyalgia, Leukocytosis, Monocytosis, Peripheral arterial disease (HCC) (01/23/2013), and Tobacco use disorder (07/08/2012).  She presents to the office today for " medication follow up".  She was last seen in the office in February 2019 for an acute issue and in 2016 for continued evaluation of chronic issues.  Since we have last seen her she has head on hospital admission on 08/23/2018 for diabetic ketoacidosis as well as 2 separate ER visits for acute issues.  She reports that she has been getting most of her care from the [place] in [place] but no longer wishes to see them "because you just go from one doctor to the other and they just keep prescribing medication".  DM In the past she has been prescribed glipizide 7-1/2 mg twice daily and Metformin 1000 mg twice daily for management of diabetes.  At the [facility] [address] she was started on insulin glargine, 8 units nightly as well as Jardiance 12.5 mg daily.  She reports that [place] Pepper is given her yeast infections and UTIs.  She has not been checking her blood sugar at home on a routine basis and would like the [place] system so that she can keep an eye on her blood sugars. Lab Results  Component Value Date   HGBA1C 6.9 (H) 09/05/2015   Hyperlipidemia -  She also has a history of high cholesterol for which she took pravastatin 40 mg nightly and aspirin 81 mg.  CAD She does have a history of CAD with MI in 2012.  At this time she had a cardiac catheterization which demonstrated 20% proximal stenosis and 36% mid stenosis in the LAD.  The circumflex had 20% proximal stenosis.  The right Neri artery and 20% stenosis with distal vessel 99% stenosis.  She has not been  back to see cardiology since 2016.  She was prescribed Imdur and night nitro by cardiology.  Depression  For depression she was prescribed BuSpar 30 mg in the morning and 15 mg in the evening as well as Prozac 40 mg daily.  Hypertension  -Currently prescribed propranolol 40 mg twice daily, losartan 12.5 mg daily, and hydrochlorothiazide 25 mg daily. BP Readings from Last 3 Encounters:  03/18/20 (!) 154/89  01/22/20 96/67  08/21/18 121/71   H/o Seizures -takes Keppra 250 mg 3 times daily.  No recent seizure-like activity.  Fibromyalgia -currently prescribed Flexeril 10 mg nightly, Elavil 50 mg nightly, and Prozac 20 mg daily.  She is not sure how well this is working for her and is in constant discomfort from fibromyalgia pain.  Review of Systems     Objective:   Physical Exam Vitals and nursing note reviewed.  Constitutional:      Appearance: Normal appearance.  HENT:     Right Ear: Tympanic membrane, ear canal and external ear normal.     Left Ear: Tympanic membrane, ear canal and external ear normal.     Nose: Nose normal.     Mouth/Throat:     Mouth: Mucous membranes are moist.     Dentition: Abnormal dentition.  Eyes:     Extraocular Movements: Extraocular movements intact.     Pupils: Pupils are equal, round, and reactive to light.  Cardiovascular:  Rate and Rhythm: Normal rate and regular rhythm.     Pulses: Normal pulses.     Heart sounds: Normal heart sounds.  Pulmonary:     Effort: Pulmonary effort is normal.     Breath sounds: Normal breath sounds.  Abdominal:     General: Abdomen is flat. Bowel sounds are normal.     Palpations: Abdomen is soft.  Musculoskeletal:        General: Tenderness present. Normal range of motion.  Skin:    General: Skin is warm and dry.     Capillary Refill: Capillary refill takes less than 2 seconds.  Neurological:     General: No focal deficit present.     Mental Status: She is alert and oriented to person, place, and time.    Psychiatric:        Mood and Affect: Mood normal.        Behavior: Behavior normal.        Thought Content: Thought content normal.        Judgment: Judgment normal.        Assessment & Plan:  Will refill her medications that she needs refilled and start over with labs.  We'll have her follow-up in 1 week to go over her labs and continue to get her reestablished.    1. Uncontrolled type 2 diabetes mellitus with hyperglycemia (HCC) - Stop jardiance due to yeast infection UTIs. - CBC with Differential/Platelet; Future - Comprehensive metabolic panel; Future - Hemoglobin A1c; Future - Lipid panel; Future - TSH; Future - Insulin Pen Needle 29G X 12.7MM MISC; Use as directed  Dispense: 90 each; Refill: 1 - Urinalysis - Continuous Blood Gluc Receiver (FREESTYLE LIBRE 14 DAY READER) DEVI; Use with libre system  Dispense: 1 each; Refill: 0 - Continuous Blood Gluc Sensor (FREESTYLE LIBRE 14 DAY SENSOR) MISC; Use with freestyle libre reader  Dispense: 2 each; Refill: 3 - insulin glargine (LANTUS SOLOSTAR) 100 UNIT/ML Solostar Pen; Inject 8 Units into the skin at bedtime.  2. Mixed hyperlipidemia  - CBC with Differential/Platelet; Future - Comprehensive metabolic panel; Future - Hemoglobin A1c; Future - Lipid panel; Future - TSH; Future  3. Depression, unspecified depression type  - FLUoxetine (PROZAC) 20 MG capsule; Take 40 mg by mouth daily.  4. Coronary artery disease involving native coronary artery of native heart without angina pectoris  - CBC with Differential/Platelet; Future - Comprehensive metabolic panel; Future - Hemoglobin A1c; Future - Lipid panel; Future - TSH; Future - Ambulatory referral to Cardiology  5. Essential hypertension  - CBC with Differential/Platelet; Future - Comprehensive metabolic panel; Future - Hemoglobin A1c; Future - Lipid panel; Future - TSH; Future - propranolol (INDERAL) 40 MG tablet; Take 1 tablet (40 mg total) by mouth 2 (two)  times daily.  Dispense: 90 tablet; Refill: 0 - losartan (COZAAR) 25 MG tablet; Take 12.5 mg by mouth daily.  6. Fibromyalgia  - pregabalin (LYRICA) 100 MG capsule; Take 1 capsule (100 mg total) by mouth 2 (two) times daily.  Dispense: 180 capsule; Refill: 0 - cyclobenzaprine (FLEXERIL) 10 MG tablet; Take 10 tablets by mouth at bedtime as needed. - amitriptyline (ELAVIL) 50 MG tablet; Take 50 mg by mouth at bedtime. - FLUoxetine (PROZAC) 20 MG capsule; Take 40 mg by mouth daily.  7. Seizures (HCC)  - levETIRAcetam (KEPPRA) 250 MG tablet; Take 1 tablet (250 mg total) by mouth in the morning, at noon, and at bedtime.  Dispense: 270 tablet; Refill: 0  8. Peripheral arterial disease (  HCC)  - cilostazol (PLETAL) 100 MG tablet; Take 0.5 tablets (50 mg total) by mouth 2 (two) times daily. Take one-half tablet by mouth twice daily  Dispense: 90 tablet; Refill: 0   Shirline Frees, NP  Time spent on chart review, time with patient; discussion of multiple chronic medical issues home monitoring, treatment, follow up plan, and documentation 45 minutes

## 2020-04-04 NOTE — Patient Instructions (Addendum)
Welcome back!   Please get labs at the [facility] office Monday or Tuesday next week   Please schedule an appointment at 4 pm next Friday   I have sent in medication refills

## 2020-04-08 ENCOUNTER — Other Ambulatory Visit (INDEPENDENT_AMBULATORY_CARE_PROVIDER_SITE_OTHER): Payer: Medicare HMO

## 2020-04-08 DIAGNOSIS — E1165 Type 2 diabetes mellitus with hyperglycemia: Secondary | ICD-10-CM

## 2020-04-08 DIAGNOSIS — I2510 Atherosclerotic heart disease of native coronary artery without angina pectoris: Secondary | ICD-10-CM | POA: Diagnosis not present

## 2020-04-08 DIAGNOSIS — E782 Mixed hyperlipidemia: Secondary | ICD-10-CM | POA: Diagnosis not present

## 2020-04-08 DIAGNOSIS — I10 Essential (primary) hypertension: Secondary | ICD-10-CM | POA: Diagnosis not present

## 2020-04-08 LAB — TSH: TSH: 1.43 u[IU]/mL (ref 0.35–4.50)

## 2020-04-08 LAB — LIPID PANEL
Cholesterol: 130 mg/dL (ref 0–200)
HDL: 41.1 mg/dL (ref >39.00)
LDL Cholesterol: 50 mg/dL (ref 0–99)
NonHDL: 88.91
Total CHOL/HDL Ratio: 3
Triglycerides: 193 mg/dL — ABNORMAL HIGH (ref 0.0–149.0)
VLDL: 38.6 mg/dL (ref 0.0–40.0)

## 2020-04-08 LAB — CBC WITH DIFFERENTIAL/PLATELET
Basophils Absolute: 0.1 10*3/uL (ref 0.0–0.1)
Basophils Relative: 0.6 % (ref 0.0–3.0)
Eosinophils Absolute: 0.5 10*3/uL (ref 0.0–0.7)
Eosinophils Relative: 3.9 % (ref 0.0–5.0)
HCT: 41.5 % (ref 36.0–46.0)
Hemoglobin: 13.7 g/dL (ref 12.0–15.0)
Lymphocytes Relative: 20.8 % (ref 12.0–46.0)
Lymphs Abs: 2.5 10*3/uL (ref 0.7–4.0)
MCHC: 33 g/dL (ref 30.0–36.0)
MCV: 91.6 fl (ref 78.0–100.0)
Monocytes Absolute: 0.9 10*3/uL (ref 0.1–1.0)
Monocytes Relative: 7.5 % (ref 3.0–12.0)
Neutro Abs: 8.1 10*3/uL — ABNORMAL HIGH (ref 1.4–7.7)
Neutrophils Relative %: 67.2 % (ref 43.0–77.0)
Platelets: 318 10*3/uL (ref 150.0–400.0)
RBC: 4.53 Mil/uL (ref 3.87–5.11)
RDW: 14.7 % (ref 11.5–15.5)
WBC: 12.1 10*3/uL — ABNORMAL HIGH (ref 4.0–10.5)

## 2020-04-08 LAB — COMPREHENSIVE METABOLIC PANEL
ALT: 8 U/L (ref 0–35)
AST: 10 U/L (ref 0–37)
Albumin: 4 g/dL (ref 3.5–5.2)
Alkaline Phosphatase: 51 U/L (ref 39–117)
BUN: 19 mg/dL (ref 6–23)
CO2: 24 mEq/L (ref 19–32)
Calcium: 9.2 mg/dL (ref 8.4–10.5)
Chloride: 104 mEq/L (ref 96–112)
Creatinine, Ser: 0.81 mg/dL (ref 0.40–1.20)
GFR: 71.62 mL/min (ref >60.00)
Glucose, Bld: 179 mg/dL — ABNORMAL HIGH (ref 70–99)
Potassium: 4.5 mEq/L (ref 3.5–5.1)
Sodium: 136 mEq/L (ref 135–145)
Total Bilirubin: 0.2 mg/dL (ref 0.2–1.2)
Total Protein: 6.6 g/dL (ref 6.0–8.3)

## 2020-04-08 LAB — HEMOGLOBIN A1C: Hgb A1c MFr Bld: 8.5 % — ABNORMAL HIGH (ref 4.6–6.5)

## 2020-04-08 NOTE — Addendum Note (Signed)
Addended by: Miguel Aschoff on: 04/08/2020 01:57 PM   Modules accepted: Orders

## 2020-04-11 ENCOUNTER — Other Ambulatory Visit: Payer: Self-pay

## 2020-04-11 ENCOUNTER — Encounter: Payer: Self-pay | Admitting: Adult Health

## 2020-04-11 ENCOUNTER — Ambulatory Visit (INDEPENDENT_AMBULATORY_CARE_PROVIDER_SITE_OTHER): Payer: Medicare HMO | Admitting: Adult Health

## 2020-04-11 VITALS — BP 132/78 | Temp 98.2°F | Wt 153.0 lb

## 2020-04-11 DIAGNOSIS — M542 Cervicalgia: Secondary | ICD-10-CM | POA: Diagnosis not present

## 2020-04-11 DIAGNOSIS — E1165 Type 2 diabetes mellitus with hyperglycemia: Secondary | ICD-10-CM | POA: Diagnosis not present

## 2020-04-11 MED ORDER — METHYLPREDNISOLONE ACETATE 40 MG/ML IJ SUSP
40.0000 mg | Freq: Once | INTRAMUSCULAR | Status: AC
  Administered 2020-04-11: 40 mg via INTRAMUSCULAR

## 2020-04-15 NOTE — Progress Notes (Signed)
Subjective:    Patient ID: [name], female    DOB: 1956-08-22, 64 y.o.   MRN: [id number]  HPI 64 year old female who  has a past medical history of Acute MI, inferior wall, initial episode of care [facility]) (07/08/2012), Anxiety, Asthma, Coronary artery disease, Depression, Diabetes mellitus, Fibromyalgia, Leukocytosis, Monocytosis, Peripheral arterial disease (HCC) (01/23/2013), and Tobacco use disorder (07/08/2012).  She presents to the office today for 1 week follow-up.  During her last visit she essentially reestablish care after being seen at the [facility] for multiple years.  We did routine lab work on her and her A1c showed a reading of 8.5.  He was prescribed the [place] system to help monitor her blood sugars and reports that since starting the system of blood sugar management that her blood sugars have been much better controlled.  She is able to see foods that elevate her blood sugars and has been cutting back on carbs and sugars.  Most of her blood sugars have been between 120 and 160 with her last 7-day average being 143.  Her biggest complaint today is that of bilateral neck pain.  She reports that she often feels tight in her neck and will sometimes get a "sharp" pain at the base of her skull.  She denies dizziness, lightheadedness, or blurred vision.  She does not get headaches.   Review of Systems See HPI   Past Medical History:  Diagnosis Date  . Acute MI, inferior wall, initial episode of care [facility]) 07/08/2012   Cath-07/08/11 -distal RCA stent DES (99%), LAD 20-30%. EF 50% inferior hypokinesis  (infarct)  . Anxiety   . Asthma   . Coronary artery disease   . Depression   . Diabetes mellitus   . Fibromyalgia   . Leukocytosis   . Monocytosis   . Peripheral arterial disease (HCC) 01/23/2013  . Tobacco use disorder 07/08/2012    Social History   Socioeconomic History  . Marital status: Married    Spouse name: Not on file  . Number of children: 3  . Years of education:  college  . Highest education level: Not on file  Occupational History  . Not on file  Tobacco Use  . Smoking status: Current Every Day Smoker    Packs/day: 1.00    Years: 43.00    Pack years: 43.00    Types: Cigarettes  . Smokeless tobacco: Never Used  Substance and Sexual Activity  . Alcohol use: No    Alcohol/week: 0.0 standard drinks    Comment: Rarely  . Drug use: No  . Sexual activity: Yes  Other Topics Concern  . Not on file  Social History Narrative   Lives with husband, married for 20 years.      They have three grown chlildren.   She is not currently working and is on disability   Highest level of education:  Engineer, maintenance (IT)   Pets: two dogs and three rabbits.       Husband is a long Production assistant, radio.       Is unable to enjoy any activities because of body pains.       Social Determinants of Health   Financial Resource Strain:   . Difficulty of Paying Living Expenses:   Food Insecurity:   . Worried About Programme researcher, broadcasting/film/video in the Last Year:   . Barista in the Last Year:   Transportation Needs:   . Freight forwarder (Medical):   Marland Kitchen Lack  of Transportation (Non-Medical):   Physical Activity:   . Days of Exercise per Week:   . Minutes of Exercise per Session:   Stress:   . Feeling of Stress :   Social Connections:   . Frequency of Communication with Friends and Family:   . Frequency of Social Gatherings with Friends and Family:   . Attends Religious Services:   . Active Member of Clubs or Organizations:   . Attends BankerClub or Organization Meetings:   Marland Kitchen. Marital Status:   Intimate Partner Violence:   . Fear of Current or Ex-Partner:   . Emotionally Abused:   Marland Kitchen. Physically Abused:   . Sexually Abused:     Past Surgical History:  Procedure Laterality Date  . ABDOMINAL AORTAGRAM N/A 02/07/2013   Procedure: ABDOMINAL Ronny FlurryAORTAGRAM;  Surgeon: Iran OuchMuhammad A Arida, MD;  Location: [facility] CATH LAB;  Service: Cardiovascular;  Laterality: N/A;  . CARDIAC  CATHETERIZATION    . CORONARY STENT PLACEMENT    . CYST REMOVAL TRUNK  1977   tailbone  . LEFT HEART CATHETERIZATION WITH CORONARY ANGIOGRAM N/A 07/07/2012   Procedure: LEFT HEART CATHETERIZATION WITH CORONARY ANGIOGRAM;  Surgeon: Kathleene Hazelhristopher D McAlhany, MD;  [facility];  Service: Cardiovascular;  Laterality: N/A;  . PERCUTANEOUS CORONARY STENT INTERVENTION (PCI-S)  07/07/2012   Procedure: PERCUTANEOUS CORONARY STENT INTERVENTION (PCI-S);  Surgeon: Kathleene Hazelhristopher D McAlhany, MD;  [facility];  Service: Cardiovascular;;  . TONSILLECTOMY      Family History  Problem Relation Age of Onset  . Kidney disease Mother   . Diabetes Mother        DM Type II  . Alzheimer's disease Mother   . Diabetes Father        IDDM  . Heart disease Father   . Cancer Father        bone cancer died at 3152  . Cancer Sister        bone cancer at age 62  . Multiple sclerosis Daughter   . Diabetes Son 2       DM Type I  . Heart disease Maternal Grandfather   . Heart disease Paternal Grandmother   . Diabetes Son 8013       DM Type I    Allergies  Allergen Reactions  . Erythromycin Other (See Comments)  . Statins     SEVERE MUSCLE PAIN - failed lipitor, Livalo, red yeast rice (lovastatin), and she thinks Zocor and pravastatin  . Ace Inhibitors Cough    Patient refuses to take meds  . Cymbalta [Duloxetine Hcl]     Drowsiness    Current Outpatient Medications on File Prior to Visit  Medication Sig Dispense Refill  . amitriptyline (ELAVIL) 50 MG tablet Take 50 mg by mouth at bedtime.    Marland Kitchen. aspirin 81 MG tablet Take 1 tablet (81 mg total) by mouth 2 (two) times daily.    . busPIRone (BUSPAR) 15 MG tablet Take 15 mg by mouth 3 (three) times daily.     . Cholecalciferol (VITAMIN D3) 2000 units TABS Take 1 tablet by mouth daily.    . cilostazol (PLETAL) 100 MG tablet Take 0.5 tablets (50 mg total) by mouth 2 (two) times daily. Take one-half tablet by mouth twice daily 90 tablet 0  .  Continuous Blood Gluc Receiver (FREESTYLE LIBRE 14 DAY READER) DEVI Use with libre system 1 each 0  . Continuous Blood Gluc Sensor (FREESTYLE LIBRE 14 DAY SENSOR) MISC Use with freestyle libre reader 2 each 3  .  cyclobenzaprine (FLEXERIL) 10 MG tablet Take 10 tablets by mouth at bedtime as needed.    Marland Kitchen FLUoxetine (PROZAC) 20 MG capsule Take 40 mg by mouth daily.    . hydrochlorothiazide (HYDRODIURIL) 25 MG tablet Take 25 mg by mouth daily.    . insulin glargine (LANTUS SOLOSTAR) 100 UNIT/ML Solostar Pen Inject 8 Units into the skin at bedtime.    . Insulin Pen Needle 29G X 12.7MM MISC Use as directed 90 each 1  . isosorbide mononitrate (IMDUR) 60 MG 24 hr tablet Take 1.5 tablets (90 mg total) by mouth daily. (Patient taking differently: Take 60 mg by mouth daily. ) 135 tablet 3  . levETIRAcetam (KEPPRA) 250 MG tablet Take 1 tablet (250 mg total) by mouth in the morning, at noon, and at bedtime. 270 tablet 0  . losartan (COZAAR) 25 MG tablet Take 12.5 mg by mouth daily.    . metFORMIN (GLUCOPHAGE) 1000 MG tablet TAKE ONE TABLET BY MOUTH TWICE DAILY WITH MEALS 60 tablet 0  . nitroGLYCERIN (NITROSTAT) 0.4 MG SL tablet Place 1 tablet (0.4 mg total) under the tongue every 5 (five) minutes x 3 doses as needed for chest pain. 30 tablet 12  . nystatin cream (MYCOSTATIN) Apply 1 application topically 2 (two) times daily.    . pravastatin (PRAVACHOL) 40 MG tablet Take 40 mg by mouth at bedtime.    . pregabalin (LYRICA) 100 MG capsule Take 1 capsule (100 mg total) by mouth 2 (two) times daily. 180 capsule 0  . propranolol (INDERAL) 40 MG tablet Take 1 tablet (40 mg total) by mouth 2 (two) times daily. 90 tablet 0   No current facility-administered medications on file prior to visit.    BP 132/78   Temp 98.2 F (36.8 C)   Wt 153 lb (69.4 kg)   BMI 26.68 kg/m       Objective:   Physical Exam Vitals and nursing note reviewed.  Constitutional:      Appearance: Normal appearance.  Cardiovascular:       Rate and Rhythm: Normal rate and regular rhythm.     Pulses: Normal pulses.     Heart sounds: Normal heart sounds.  Pulmonary:     Effort: Pulmonary effort is normal.     Breath sounds: Normal breath sounds.  Musculoskeletal:        General: Tenderness (Tightness in bilateral trapezius muscles with multiple trigger point sensitivity) present.  Skin:    General: Skin is warm and dry.     Capillary Refill: Capillary refill takes less than 2 seconds.  Neurological:     General: No focal deficit present.     Mental Status: She is alert and oriented to person, place, and time.  Psychiatric:        Mood and Affect: Mood normal.        Behavior: Behavior normal.        Thought Content: Thought content normal.        Judgment: Judgment normal.       Assessment & Plan:  1. Uncontrolled type 2 diabetes mellitus with hyperglycemia (HCC) -No change in medication at this point.  She was encouraged to continue working on diet and aerobic exercise.  Follow-up in 3 months for further evaluation  2. Neck pain - Verbal consent obtained.  A trigger point injection was performed at the site of maximal tenderness in bilateral trapezius muscles using 1% plain Lidocaine and Depo Medrol.  This was well tolerated, and followed by mild relief  of pain. Patient tolerated procedure well.   - methylPREDNISolone acetate (DEPO-MEDROL) injection 40 mg  Shirline Frees, NP

## 2020-04-16 ENCOUNTER — Telehealth: Payer: Self-pay | Admitting: Family Medicine

## 2020-04-16 NOTE — Telephone Encounter (Signed)
PA for Keppra submitted through [facility].  Waiting on a response by fax.

## 2020-04-17 NOTE — Telephone Encounter (Signed)
PA has been approved through 09/26/2020.  Nothing further needed.

## 2020-05-19 NOTE — Progress Notes (Signed)
This encounter was created in error - please disregard.

## 2020-05-20 ENCOUNTER — Encounter: Payer: Medicare HMO | Admitting: Cardiovascular Disease

## 2020-06-06 ENCOUNTER — Ambulatory Visit: Payer: Medicare HMO

## 2020-06-06 ENCOUNTER — Telehealth: Payer: Self-pay | Admitting: Adult Health

## 2020-06-06 ENCOUNTER — Other Ambulatory Visit: Payer: Self-pay

## 2020-06-06 NOTE — Progress Notes (Signed)
Erroneous encounter

## 2020-06-06 NOTE — Telephone Encounter (Signed)
Attempted to contact patient x2 and left voicemails x 2 for medicare wellness visit and patient did not answer. Left voicemail asking patient to call the front desk and reschedule appointment

## 2020-06-10 ENCOUNTER — Telehealth: Payer: Self-pay | Admitting: Adult Health

## 2020-06-10 NOTE — Telephone Encounter (Signed)
Error

## 2020-06-13 ENCOUNTER — Other Ambulatory Visit: Payer: Self-pay | Admitting: Adult Health

## 2020-06-13 DIAGNOSIS — M797 Fibromyalgia: Secondary | ICD-10-CM

## 2020-06-13 DIAGNOSIS — I739 Peripheral vascular disease, unspecified: Secondary | ICD-10-CM

## 2020-06-13 DIAGNOSIS — E1165 Type 2 diabetes mellitus with hyperglycemia: Secondary | ICD-10-CM

## 2020-06-13 DIAGNOSIS — R569 Unspecified convulsions: Secondary | ICD-10-CM

## 2020-06-13 DIAGNOSIS — I10 Essential (primary) hypertension: Secondary | ICD-10-CM

## 2020-06-13 MED ORDER — CILOSTAZOL 100 MG PO TABS: 50.0000 mg | ORAL_TABLET | Freq: Two times a day (BID) | ORAL | 0 refills | Status: DC

## 2020-06-13 NOTE — Telephone Encounter (Signed)
The patient called needing her medications transferred to   [facility] [address], [place] - [place] [facility] [address] Phone:  [phone]  Fax:  [phone]       She is no longer going through the [place] to get her Rx.  She is no longer taking: aspirin 81 MG tablet  She needs refills on: cilostazol (PLETAL) 100 MG tablet  Continuous Blood Gluc Receiver (FREESTYLE LIBRE 14 DAY READER) DEVI  Continuous Blood Gluc Sensor (FREESTYLE LIBRE 14 DAY SENSOR) MISC  hydrochlorothiazide (HYDRODIURIL) 25 MG tablet (would like to be back on this medication)  insulin glargine (LANTUS SOLOSTAR) 100 UNIT/ML Solostar Pen (she is injecting 16 units instead of 8 units)  Insulin Pen Needle 29G X 12.7MM MISC  isosorbide mononitrate (IMDUR) 60 MG 24 hr tablet (is wanting this medication changed to 90 MG)  levETIRAcetam (KEPPRA) 250 MG tablet  losartan (COZAAR) 25 MG tablet  metFORMIN (GLUCOPHAGE) 1000 MG tablet  nitroGLYCERIN (NITROSTAT) 0.4 MG SL tablet  nystatin cream (MYCOSTATIN)  pravastatin (PRAVACHOL) 40 MG tablet  pregabalin (LYRICA) 100 MG capsule  propranolol (INDERAL) 40 MG tablet  She also wanted to add that she is taking:  glipiZIDE (GLUCOTROL) 5 MG tablet  In a 10 MG 1 AM and 1 PM

## 2020-06-17 ENCOUNTER — Other Ambulatory Visit: Payer: Self-pay | Admitting: Adult Health

## 2020-06-17 DIAGNOSIS — M797 Fibromyalgia: Secondary | ICD-10-CM

## 2020-06-17 MED ORDER — METFORMIN HCL 1000 MG PO TABS
1000.0000 mg | ORAL_TABLET | Freq: Two times a day (BID) | ORAL | 1 refills | Status: DC
Start: 2020-06-17 — End: 2021-05-29

## 2020-06-17 MED ORDER — CILOSTAZOL 100 MG PO TABS
50.0000 mg | ORAL_TABLET | Freq: Two times a day (BID) | ORAL | 0 refills | Status: AC
Start: 2020-06-17 — End: 2020-09-15

## 2020-06-17 MED ORDER — PREGABALIN 100 MG PO CAPS: 100.0000 mg | ORAL_CAPSULE | Freq: Two times a day (BID) | ORAL | 0 refills | Status: DC

## 2020-06-17 MED ORDER — LOSARTAN POTASSIUM 25 MG PO TABS: 12.5000 mg | ORAL_TABLET | Freq: Every day | ORAL | 1 refills | Status: DC

## 2020-06-17 MED ORDER — PROPRANOLOL HCL 40 MG PO TABS: 40.0000 mg | ORAL_TABLET | Freq: Two times a day (BID) | ORAL | 0 refills | Status: DC

## 2020-06-17 MED ORDER — LANTUS SOLOSTAR 100 UNIT/ML ~~LOC~~ SOPN: 16.0000 [IU] | PEN_INJECTOR | Freq: Every day | SUBCUTANEOUS | 1 refills | Status: DC

## 2020-06-17 MED ORDER — LEVETIRACETAM 250 MG PO TABS: 250.0000 mg | ORAL_TABLET | Freq: Three times a day (TID) | ORAL | 0 refills | Status: DC

## 2020-06-17 MED ORDER — ISOSORBIDE MONONITRATE ER 60 MG PO TB24
90.0000 mg | ORAL_TABLET | Freq: Every day | ORAL | 3 refills | Status: DC
Start: 2020-06-17 — End: 2021-11-06

## 2020-06-17 MED ORDER — GLIPIZIDE 5 MG PO TABS: 5.0000 mg | ORAL_TABLET | Freq: Two times a day (BID) | ORAL | 3 refills | Status: DC

## 2020-06-17 MED ORDER — INSULIN PEN NEEDLE 29G X 12.7MM MISC: 1 refills | Status: DC

## 2020-06-17 MED ORDER — FREESTYLE LIBRE 14 DAY READER DEVI: 0 refills | Status: DC

## 2020-06-17 MED ORDER — FREESTYLE LIBRE 14 DAY SENSOR MISC: 3 refills | Status: DC

## 2020-06-17 MED ORDER — PRAVASTATIN SODIUM 40 MG PO TABS
40.0000 mg | ORAL_TABLET | Freq: Every day | ORAL | 1 refills | Status: DC
Start: 2020-06-17 — End: 2020-10-03

## 2020-06-17 NOTE — Addendum Note (Signed)
Addended by: Kathreen Devoid on: 06/17/2020 11:48 AM   Modules accepted: Orders

## 2020-06-17 NOTE — Telephone Encounter (Signed)
Ok to refill x 90 days  Thanks  [employer]

## 2020-07-15 ENCOUNTER — Other Ambulatory Visit: Payer: Self-pay

## 2020-07-15 ENCOUNTER — Telehealth: Payer: Medicare HMO | Admitting: Adult Health

## 2020-07-15 NOTE — Progress Notes (Deleted)
Subjective:    Patient ID: [name], female    DOB: [birth year], 64 y.o.   MRN: [id number]  HPI 64 year old female who  has a past medical history of Acute MI, inferior wall, initial episode of care [facility]) (07/08/2012), Anxiety, Asthma, Coronary artery disease, Depression, Diabetes mellitus, Fibromyalgia, Leukocytosis, Monocytosis, Peripheral arterial disease (HCC) (01/23/2013), and Tobacco use disorder (07/08/2012).  She presents to the office today for 31-month follow-up regarding diabetes mellitus.  She continues to use the [facility] system to help manage her blood sugars who reports readings between 120 and 160 with her 7-day average being ***  She is currently prescribed glipizide 5 mg twice daily, Metformin 1000 mg twice daily, and Lantus 16 units nightly.  Lab Results  Component Value Date   HGBA1C 8.5 (H) 04/08/2020     Review of Systems See HPI   Past Medical History:  Diagnosis Date  . Acute MI, inferior wall, initial episode of care [facility]) 07/08/2012   Cath-07/08/11 -distal RCA stent DES (99%), LAD 20-30%. EF 50% inferior hypokinesis  (infarct)  . Anxiety   . Asthma   . Coronary artery disease   . Depression   . Diabetes mellitus   . Fibromyalgia   . Leukocytosis   . Monocytosis   . Peripheral arterial disease (HCC) 01/23/2013  . Tobacco use disorder 07/08/2012    Social History   Socioeconomic History  . Marital status: Married    Spouse name: Not on file  . Number of children: 3  . Years of education: college  . Highest education level: Not on file  Occupational History  . Not on file  Tobacco Use  . Smoking status: Current Every Day Smoker    Packs/day: 1.00    Years: 43.00    Pack years: 43.00    Types: Cigarettes  . Smokeless tobacco: Never Used  Substance and Sexual Activity  . Alcohol use: No    Alcohol/week: 0.0 standard drinks    Comment: Rarely  . Drug use: No  . Sexual activity: Yes  Other Topics Concern  . Not on file  Social History  Narrative   Lives with husband, married for 20 years.      They have three grown chlildren.   She is not currently working and is on disability   Highest level of education:  Engineer, maintenance (IT)   Pets: two dogs and three rabbits.       Husband is a long Production assistant, radio.       Is unable to enjoy any activities because of body pains.       Social Determinants of Health   Financial Resource Strain:   . Difficulty of Paying Living Expenses: Not on file  Food Insecurity:   . Worried About Programme researcher, broadcasting/film/video in the Last Year: Not on file  . Ran Out of Food in the Last Year: Not on file  Transportation Needs:   . Lack of Transportation (Medical): Not on file  . Lack of Transportation (Non-Medical): Not on file  Physical Activity:   . Days of Exercise per Week: Not on file  . Minutes of Exercise per Session: Not on file  Stress:   . Feeling of Stress : Not on file  Social Connections:   . Frequency of Communication with Friends and Family: Not on file  . Frequency of Social Gatherings with Friends and Family: Not on file  . Attends Religious Services: Not on file  . Active Member of  Clubs or Organizations: Not on file  . Attends Banker Meetings: Not on file  . Marital Status: Not on file  Intimate Partner Violence:   . Fear of Current or Ex-Partner: Not on file  . Emotionally Abused: Not on file  . Physically Abused: Not on file  . Sexually Abused: Not on file    Past Surgical History:  Procedure Laterality Date  . ABDOMINAL AORTAGRAM N/A 02/07/2013   Procedure: ABDOMINAL Ronny Flurry;  Surgeon: Iran Ouch, MD;  Location: [facility] CATH LAB;  Service: Cardiovascular;  Laterality: N/A;  . CARDIAC CATHETERIZATION    . CORONARY STENT PLACEMENT    . CYST REMOVAL TRUNK  1977   tailbone  . LEFT HEART CATHETERIZATION WITH CORONARY ANGIOGRAM N/A 07/07/2012   Procedure: LEFT HEART CATHETERIZATION WITH CORONARY ANGIOGRAM;  Surgeon: Kathleene Hazel, MD;  Location: [facility]  CATH LAB;  Service: Cardiovascular;  Laterality: N/A;  . PERCUTANEOUS CORONARY STENT INTERVENTION (PCI-S)  07/07/2012   Procedure: PERCUTANEOUS CORONARY STENT INTERVENTION (PCI-S);  Surgeon: Kathleene Hazel, MD;  Location: [facility] CATH LAB;  Service: Cardiovascular;;  . TONSILLECTOMY      Family History  Problem Relation Age of Onset  . Kidney disease Mother   . Diabetes Mother        DM Type II  . Alzheimer's disease Mother   . Diabetes Father        IDDM  . Heart disease Father   . Cancer Father        bone cancer died at 74  . Cancer Sister        bone cancer at age 41  . Multiple sclerosis Daughter   . Diabetes Son 2       DM Type I  . Heart disease Maternal Grandfather   . Heart disease Paternal Grandmother   . Diabetes Son 38       DM Type I    Allergies  Allergen Reactions  . Erythromycin Other (See Comments)  . Statins     SEVERE MUSCLE PAIN - failed lipitor, Livalo, red yeast rice (lovastatin), and she thinks Zocor and pravastatin  . Ace Inhibitors Cough    Patient refuses to take meds  . Cymbalta [Duloxetine Hcl]     Drowsiness    Current Outpatient Medications on File Prior to Visit  Medication Sig Dispense Refill  . amitriptyline (ELAVIL) 50 MG tablet Take 50 mg by mouth at bedtime.    . busPIRone (BUSPAR) 15 MG tablet Take 15 mg by mouth 3 (three) times daily.     . Cholecalciferol (VITAMIN D3) 2000 units TABS Take 1 tablet by mouth daily.    . cilostazol (PLETAL) 100 MG tablet Take 0.5 tablets (50 mg total) by mouth 2 (two) times daily. Take one-half tablet by mouth twice daily 90 tablet 0  . Continuous Blood Gluc Receiver (FREESTYLE LIBRE 14 DAY READER) DEVI Use with libre system 1 each 0  . Continuous Blood Gluc Sensor (FREESTYLE LIBRE 14 DAY SENSOR) MISC Use with freestyle libre reader 2 each 3  . cyclobenzaprine (FLEXERIL) 10 MG tablet Take 10 tablets by mouth at bedtime as needed.    Marland Kitchen FLUoxetine (PROZAC) 20 MG capsule Take 40 mg by mouth daily.      Marland Kitchen glipiZIDE (GLUCOTROL) 5 MG tablet Take 1 tablet (5 mg total) by mouth 2 (two) times daily before a meal. 60 tablet 3  . hydrochlorothiazide (HYDRODIURIL) 25 MG tablet Take 25 mg by mouth daily.    Marland Kitchen  insulin glargine (LANTUS SOLOSTAR) 100 UNIT/ML Solostar Pen Inject 16 Units into the skin at bedtime. 15 mL 1  . Insulin Pen Needle 29G X 12.7MM MISC Use as directed 90 each 1  . isosorbide mononitrate (IMDUR) 60 MG 24 hr tablet Take 1.5 tablets (90 mg total) by mouth daily. 135 tablet 3  . levETIRAcetam (KEPPRA) 250 MG tablet Take 1 tablet (250 mg total) by mouth in the morning, at noon, and at bedtime. 270 tablet 0  . losartan (COZAAR) 25 MG tablet Take 0.5 tablets (12.5 mg total) by mouth daily. 45 tablet 1  . metFORMIN (GLUCOPHAGE) 1000 MG tablet Take 1 tablet (1,000 mg total) by mouth 2 (two) times daily with a meal. 180 tablet 1  . nitroGLYCERIN (NITROSTAT) 0.4 MG SL tablet Place 1 tablet (0.4 mg total) under the tongue every 5 (five) minutes x 3 doses as needed for chest pain. 30 tablet 12  . nystatin cream (MYCOSTATIN) Apply 1 application topically 2 (two) times daily.    . pravastatin (PRAVACHOL) 40 MG tablet Take 1 tablet (40 mg total) by mouth at bedtime. 90 tablet 1  . pregabalin (LYRICA) 100 MG capsule Take 1 capsule (100 mg total) by mouth 2 (two) times daily. 180 capsule 0  . propranolol (INDERAL) 40 MG tablet Take 1 tablet (40 mg total) by mouth 2 (two) times daily. 90 tablet 0  . RELION PEN NEEDLES 31G X 6 MM MISC 1 each by Other route as directed.     No current facility-administered medications on file prior to visit.    There were no vitals taken for this visit.      Objective:   Physical Exam Vitals and nursing note reviewed.  Constitutional:      Appearance: Normal appearance.  Cardiovascular:     Rate and Rhythm: Normal rate and regular rhythm.     Pulses: Normal pulses.     Heart sounds: Normal heart sounds.  Pulmonary:     Effort: Pulmonary effort is normal.      Breath sounds: Normal breath sounds.  Musculoskeletal:        General: Normal range of motion.  Skin:    General: Skin is warm and dry.  Neurological:     General: No focal deficit present.     Mental Status: She is alert and oriented to person, place, and time.  Psychiatric:        Mood and Affect: Mood normal.        Behavior: Behavior normal.        Thought Content: Thought content normal.        Judgment: Judgment normal.       Assessment & Plan:

## 2020-07-30 ENCOUNTER — Other Ambulatory Visit: Payer: Self-pay

## 2020-07-30 ENCOUNTER — Encounter: Payer: Self-pay | Admitting: Adult Health

## 2020-07-30 ENCOUNTER — Ambulatory Visit (INDEPENDENT_AMBULATORY_CARE_PROVIDER_SITE_OTHER): Payer: Medicare HMO | Admitting: Adult Health

## 2020-07-30 VITALS — BP 144/94 | HR 96 | Temp 98.3°F | Ht 63.5 in | Wt 153.2 lb

## 2020-07-30 DIAGNOSIS — E1165 Type 2 diabetes mellitus with hyperglycemia: Secondary | ICD-10-CM

## 2020-07-30 DIAGNOSIS — N3000 Acute cystitis without hematuria: Secondary | ICD-10-CM | POA: Diagnosis not present

## 2020-07-30 DIAGNOSIS — K047 Periapical abscess without sinus: Secondary | ICD-10-CM

## 2020-07-30 LAB — POCT GLYCOSYLATED HEMOGLOBIN (HGB A1C): Hemoglobin A1C: 7.6 % — AB (ref 4.0–5.6)

## 2020-07-30 MED ORDER — CLINDAMYCIN HCL 300 MG PO CAPS: 300.0000 mg | ORAL_CAPSULE | Freq: Three times a day (TID) | ORAL | 0 refills | Status: AC

## 2020-07-30 NOTE — Progress Notes (Signed)
Subjective:    Patient ID: [name], female    DOB: July 26, 1956, 64 y.o.   MRN: [id number]  HPI 64 year old female who  has a past medical history of Acute MI, inferior wall, initial episode of care [facility]) (07/08/2012), Anxiety, Asthma, Coronary artery disease, Depression, Diabetes mellitus, Fibromyalgia, Leukocytosis, Monocytosis, Peripheral arterial disease (HCC) (01/23/2013), and Tobacco use disorder (07/08/2012).  She presents to the office today for 31-month follow-up regarding diabetes mellitus.  She does use the libre system to monitor her blood sugars and reports readings in the 120s to 130s during the day then after dinner spikes to upwards of 180s to 200s.  She is currently maintained on Metformin 1000 mg twice daily, glipizide 5 mg twice daily, and Lantus 16 units nightly.   Lab Results  Component Value Date   HGBA1C 8.5 (H) 04/08/2020   She also believes that she may have another urinary tract infection.  Her symptoms include odorous urine with urinary frequency.  Denies hematuria or dysuria.  Her symptoms have been present for 2 to 3 weeks  Additionally, she complains of painful area in her gums as well as foul taste and odor in her mouth.  She does have poor dentition and has not been seen by the dentist recently  Review of Systems See HPI   Past Medical History:  Diagnosis Date   Acute MI, inferior wall, initial episode of care (HCC) 07/08/2012   Cath-07/08/11 -distal RCA stent DES (99%), LAD 20-30%. EF 50% inferior hypokinesis  (infarct)   Anxiety    Asthma    Coronary artery disease    Depression    Diabetes mellitus    Fibromyalgia    Leukocytosis    Monocytosis    Peripheral arterial disease (HCC) 01/23/2013   Tobacco use disorder 07/08/2012    Social History   Socioeconomic History   Marital status: Married    Spouse name: Not on file   Number of children: 3   Years of education: college   Highest education level: Not on file   Occupational History   Not on file  Tobacco Use   Smoking status: Current Every Day Smoker    Packs/day: 1.00    Years: 43.00    Pack years: 43.00    Types: Cigarettes   Smokeless tobacco: Never Used  Substance and Sexual Activity   Alcohol use: No    Alcohol/week: 0.0 standard drinks    Comment: Rarely   Drug use: No   Sexual activity: Yes  Other Topics Concern   Not on file  Social History Narrative   Lives with husband, married for 20 years.      They have three grown chlildren.   She is not currently working and is on disability   Highest level of education:  Engineer, maintenance (IT)   Pets: two dogs and three rabbits.       Husband is a long Production assistant, radio.       Is unable to enjoy any activities because of body pains.       Social Determinants of Health   Financial Resource Strain:    Difficulty of Paying Living Expenses: Not on file  Food Insecurity:    Worried About Programme researcher, broadcasting/film/video in the Last Year: Not on file   [employer] of Food in the Last Year: Not on file  Transportation Needs:    Lack of Transportation (Medical): Not on file   Lack of Transportation (Non-Medical): Not on file  Physical Activity:    Days of Exercise per Week: Not on file   Minutes of Exercise per Session: Not on file  Stress:    Feeling of Stress : Not on file  Social Connections:    Frequency of Communication with Friends and Family: Not on file   Frequency of Social Gatherings with Friends and Family: Not on file   Attends Religious Services: Not on file   Active Member of Clubs or Organizations: Not on file   Attends Banker Meetings: Not on file   Marital Status: Not on file  Intimate Partner Violence:    Fear of Current or Ex-Partner: Not on file   Emotionally Abused: Not on file   Physically Abused: Not on file   Sexually Abused: Not on file    Past Surgical History:  Procedure Laterality Date   ABDOMINAL AORTAGRAM N/A 02/07/2013    Procedure: ABDOMINAL Ronny Flurry;  Surgeon: Iran Ouch, MD;  Location: [facility] CATH LAB;  Service: Cardiovascular;  Laterality: N/A;   CARDIAC CATHETERIZATION     CORONARY STENT PLACEMENT     CYST REMOVAL TRUNK  1977   tailbone   LEFT HEART CATHETERIZATION WITH CORONARY ANGIOGRAM N/A 07/07/2012   Procedure: LEFT HEART CATHETERIZATION WITH CORONARY ANGIOGRAM;  Surgeon: Kathleene Hazel, MD;  Location: [facility] CATH LAB;  Service: Cardiovascular;  Laterality: N/A;   PERCUTANEOUS CORONARY STENT INTERVENTION (PCI-S)  07/07/2012   Procedure: PERCUTANEOUS CORONARY STENT INTERVENTION (PCI-S);  Surgeon: Kathleene Hazel, MD;  Location: [facility] CATH LAB;  Service: Cardiovascular;;   TONSILLECTOMY      Family History  Problem Relation Age of Onset   Kidney disease Mother    Diabetes Mother        DM Type II   Alzheimer's disease Mother    Diabetes Father        IDDM   Heart disease Father    Cancer Father        bone cancer died at 41   Cancer Sister        bone cancer at age 4   Multiple sclerosis Daughter    Diabetes Son 2       DM Type I   Heart disease Maternal Grandfather    Heart disease Paternal Grandmother    Diabetes Son 76       DM Type I    Allergies  Allergen Reactions   Erythromycin Other (See Comments)   Statins     SEVERE MUSCLE PAIN - failed lipitor, Livalo, red yeast rice (lovastatin), and she thinks Zocor and pravastatin   Ace Inhibitors Cough    Patient refuses to take meds   Cymbalta [Duloxetine Hcl]     Drowsiness    Current Outpatient Medications on File Prior to Visit  Medication Sig Dispense Refill   amitriptyline (ELAVIL) 50 MG tablet Take 50 mg by mouth at bedtime.     busPIRone (BUSPAR) 15 MG tablet Take 15 mg by mouth 3 (three) times daily.      Cholecalciferol (VITAMIN D3) 2000 units TABS Take 1 tablet by mouth daily.     cilostazol (PLETAL) 100 MG tablet Take 0.5 tablets (50 mg total) by mouth 2 (two) times daily. Take  one-half tablet by mouth twice daily 90 tablet 0   Continuous Blood Gluc Receiver (FREESTYLE LIBRE 14 DAY READER) DEVI Use with libre system 1 each 0   Continuous Blood Gluc Sensor (FREESTYLE LIBRE 14 DAY SENSOR) MISC Use with freestyle libre reader 2 each  3   cyclobenzaprine (FLEXERIL) 10 MG tablet Take 10 tablets by mouth at bedtime as needed.     FLUoxetine (PROZAC) 20 MG capsule Take 40 mg by mouth daily.     glipiZIDE (GLUCOTROL) 5 MG tablet Take 1 tablet (5 mg total) by mouth 2 (two) times daily before a meal. 60 tablet 3   hydrochlorothiazide (HYDRODIURIL) 25 MG tablet Take 25 mg by mouth daily.     insulin glargine (LANTUS SOLOSTAR) 100 UNIT/ML Solostar Pen Inject 16 Units into the skin at bedtime. 15 mL 1   Insulin Pen Needle 29G X 12.7MM MISC Use as directed 90 each 1   isosorbide mononitrate (IMDUR) 60 MG 24 hr tablet Take 1.5 tablets (90 mg total) by mouth daily. 135 tablet 3   levETIRAcetam (KEPPRA) 250 MG tablet Take 1 tablet (250 mg total) by mouth in the morning, at noon, and at bedtime. 270 tablet 0   losartan (COZAAR) 25 MG tablet Take 0.5 tablets (12.5 mg total) by mouth daily. 45 tablet 1   metFORMIN (GLUCOPHAGE) 1000 MG tablet Take 1 tablet (1,000 mg total) by mouth 2 (two) times daily with a meal. 180 tablet 1   nitroGLYCERIN (NITROSTAT) 0.4 MG SL tablet Place 1 tablet (0.4 mg total) under the tongue every 5 (five) minutes x 3 doses as needed for chest pain. 30 tablet 12   nystatin cream (MYCOSTATIN) Apply 1 application topically 2 (two) times daily.     pravastatin (PRAVACHOL) 40 MG tablet Take 1 tablet (40 mg total) by mouth at bedtime. 90 tablet 1   pregabalin (LYRICA) 100 MG capsule Take 1 capsule (100 mg total) by mouth 2 (two) times daily. 180 capsule 0   propranolol (INDERAL) 40 MG tablet Take 1 tablet (40 mg total) by mouth 2 (two) times daily. 90 tablet 0   RELION PEN NEEDLES 31G X 6 MM MISC 1 each by Other route as directed.     No current  facility-administered medications on file prior to visit.    There were no vitals taken for this visit.      Objective:   Physical Exam Vitals and nursing note reviewed.  Constitutional:      Appearance: Normal appearance.  HENT:     Mouth/Throat:     Dentition: Abnormal dentition. Dental tenderness and dental abscesses present.     Tongue: No lesions. Tongue does not deviate from midline.  Cardiovascular:     Rate and Rhythm: Normal rate and regular rhythm.     Pulses: Normal pulses.     Heart sounds: Normal heart sounds.  Pulmonary:     Effort: Pulmonary effort is normal.     Breath sounds: Normal breath sounds.  Skin:    General: Skin is warm and dry.  Neurological:     General: No focal deficit present.     Mental Status: She is alert and oriented to person, place, and time.  Psychiatric:        Mood and Affect: Mood normal.        Behavior: Behavior normal.        Thought Content: Thought content normal.        Judgment: Judgment normal.       Assessment & Plan:  1. Uncontrolled type 2 diabetes mellitus with hyperglycemia (HCC)  - POCT glycosylated hemoglobin (Hb A1C)- 7.6 - has improved but not at goal  -Per her Josephine Igolibre information, she is spending about 53% of the time and goal with most of her  other goal time being hyperglycemic events between 6 PM and 2 AM. -We will have her continue with current medication but will add Lantus 8 units in the morning to see if we can get her evening blood sugars better controlled. -Follow-up in 3 months  2. Acute cystitis without hematuria - Treat if urine culture comes back positive  - Culture, Urine; Future - Culture, Urine  3. Dental abscess - needs to follow up with dentist - clindamycin (CLEOCIN) 300 MG capsule; Take 1 capsule (300 mg total) by mouth 3 (three) times daily for 7 days.  Dispense: 21 capsule; Refill: 0  Shirline Frees, NP

## 2020-08-03 ENCOUNTER — Encounter: Payer: Self-pay | Admitting: Cardiovascular Disease

## 2020-08-03 NOTE — Progress Notes (Signed)
Cardiology Office Note:    Date:  08/04/2020   ID:  [name], DOB Dec 11, 1955, MRN [id number]  PCP:  Shirline Frees, NP  [facility] Cardiologist:  Susanne Greenhouse   [facility] Electrophysiologist:  None   Referring MD: Shirline Frees, NP   Chief Complaint  Patient presents with  . Coronary Artery Disease    Nov . 8, 2021    [name] is a 64 y.o. female with a hx of CAD and PAD She has previously been seen by Dr. Antoine Poche and Kirke Corin. Has a hx of CAD, PAD . Ongoing cigarette smoking  She was last seen here 5 years ago by Dr. Antoine Poche.  Has not had CP,  Mild DOE  Still smokes.  Needs to have multiple teeth pulled .  Her oral surgeon  wanted to get cardiac clearance prior to dental procedure   Has hx of a coronary stent from 2013 Has a hx of PAD.    Does not walk due to pain in her legs  Limited by DOE and leg discomfort.   Has severe leg pain and leg weakness bringing the groceries from the car to the house .     Past Medical History:  Diagnosis Date  . Acute MI, inferior wall, initial episode of care [facility]) 07/08/2012   Cath-07/08/11 -distal RCA stent DES (99%), LAD 20-30%. EF 50% inferior hypokinesis  (infarct)  . Anxiety   . Asthma   . Coronary artery disease   . Depression   . Diabetes mellitus   . Fibromyalgia   . Leukocytosis   . Monocytosis   . Peripheral arterial disease (HCC) 01/23/2013  . Tobacco use disorder 07/08/2012    Past Surgical History:  Procedure Laterality Date  . ABDOMINAL AORTAGRAM N/A 02/07/2013   Procedure: ABDOMINAL Ronny Flurry;  Surgeon: Iran Ouch, MD;  Location: [facility] CATH LAB;  Service: Cardiovascular;  Laterality: N/A;  . CARDIAC CATHETERIZATION    . CORONARY STENT PLACEMENT    . CYST REMOVAL TRUNK  1977   tailbone  . LEFT HEART CATHETERIZATION WITH CORONARY ANGIOGRAM N/A 07/07/2012   Procedure: LEFT HEART CATHETERIZATION WITH CORONARY ANGIOGRAM;  Surgeon: Kathleene Hazel, MD;  Location: [facility] CATH  LAB;  Service: Cardiovascular;  Laterality: N/A;  . PERCUTANEOUS CORONARY STENT INTERVENTION (PCI-S)  07/07/2012   Procedure: PERCUTANEOUS CORONARY STENT INTERVENTION (PCI-S);  Surgeon: Kathleene Hazel, MD;  Location[facility] CATH LAB;  Service: Cardiovascular;;  . TONSILLECTOMY      Current Medications: Current Meds  Medication Sig  . amitriptyline (ELAVIL) 50 MG tablet Take 50 mg by mouth at bedtime.  . busPIRone (BUSPAR) 15 MG tablet Take 15 mg by mouth 3 (three) times daily.   . Cholecalciferol (VITAMIN D3) 2000 units TABS Take 1 tablet by mouth daily.  . cilostazol (PLETAL) 100 MG tablet Take 0.5 tablets (50 mg total) by mouth 2 (two) times daily. Take one-half tablet by mouth twice daily  . clindamycin (CLEOCIN) 300 MG capsule Take 1 capsule (300 mg total) by mouth 3 (three) times daily for 7 days.  . Continuous Blood Gluc Receiver (FREESTYLE LIBRE 14 DAY READER) DEVI Use with libre system  . Continuous Blood Gluc Sensor (FREESTYLE LIBRE 14 DAY SENSOR) MISC Use with freestyle libre reader  . cyclobenzaprine (FLEXERIL) 10 MG tablet Take 10 tablets by mouth at bedtime as needed.  Marland Kitchen FLUoxetine (PROZAC) 40 MG capsule Take 1 capsule by mouth daily.  Marland Kitchen glipiZIDE (GLUCOTROL) 10 MG tablet Take 10 mg by mouth 2 (two)  times daily before a meal.  . insulin glargine (LANTUS SOLOSTAR) 100 UNIT/ML Solostar Pen Inject 16 Units into the skin at bedtime.  . Insulin Pen Needle 29G X 12.7MM MISC Use as directed  . isosorbide mononitrate (IMDUR) 60 MG 24 hr tablet Take 1.5 tablets (90 mg total) by mouth daily.  Marland Kitchen levETIRAcetam (KEPPRA) 250 MG tablet Take 1 tablet (250 mg total) by mouth in the morning, at noon, and at bedtime.  Marland Kitchen losartan (COZAAR) 25 MG tablet Take 0.5 tablets (12.5 mg total) by mouth daily.  . metFORMIN (GLUCOPHAGE) 1000 MG tablet Take 1 tablet (1,000 mg total) by mouth 2 (two) times daily with a meal.  . nitroGLYCERIN (NITROSTAT) 0.4 MG SL tablet Place 1 tablet (0.4 mg total) under  the tongue every 5 (five) minutes x 3 doses as needed for chest pain.  Marland Kitchen nystatin cream (MYCOSTATIN) Apply 1 application topically 2 (two) times daily.  . pravastatin (PRAVACHOL) 40 MG tablet Take 1 tablet (40 mg total) by mouth at bedtime.  . pregabalin (LYRICA) 100 MG capsule Take 1 capsule (100 mg total) by mouth 2 (two) times daily.  . propranolol (INDERAL) 40 MG tablet Take 1 tablet (40 mg total) by mouth 2 (two) times daily.  Marland Kitchen RELION PEN NEEDLES 31G X 6 MM MISC 1 each by Other route as directed.  . [DISCONTINUED] FLUoxetine (PROZAC) 20 MG capsule Take 40 mg by mouth daily.     Allergies:   Erythromycin, Statins, Ace inhibitors, and Cymbalta [duloxetine hcl]   Social History   Socioeconomic History  . Marital status: Married    Spouse name: Not on file  . Number of children: 3  . Years of education: college  . Highest education level: Not on file  Occupational History  . Not on file  Tobacco Use  . Smoking status: Current Every Day Smoker    Packs/day: 1.00    Years: 43.00    Pack years: 43.00    Types: Cigarettes  . Smokeless tobacco: Never Used  Substance and Sexual Activity  . Alcohol use: No    Alcohol/week: 0.0 standard drinks    Comment: Rarely  . Drug use: No  . Sexual activity: Yes  Other Topics Concern  . Not on file  Social History Narrative   Lives with husband, married for 20 years.      They have three grown chlildren.   She is not currently working and is on disability   Highest level of education:  Engineer, maintenance (IT)   Pets: two dogs and three rabbits.       Husband is a long Production assistant, radio.       Is unable to enjoy any activities because of body pains.       Social Determinants of Health   Financial Resource Strain:   . Difficulty of Paying Living Expenses: Not on file  Food Insecurity:   . Worried About Programme researcher, broadcasting/film/video in the Last Year: Not on file  . Ran Out of Food in the Last Year: Not on file  Transportation Needs:   . Lack of  Transportation (Medical): Not on file  . Lack of Transportation (Non-Medical): Not on file  Physical Activity:   . Days of Exercise per Week: Not on file  . Minutes of Exercise per Session: Not on file  Stress:   . Feeling of Stress : Not on file  Social Connections:   . Frequency of Communication with Friends and Family: Not on file  .  Frequency of Social Gatherings with Friends and Family: Not on file  . Attends Religious Services: Not on file  . Active Member of Clubs or Organizations: Not on file  . Attends Banker Meetings: Not on file  . Marital Status: Not on file     Family History: The patient's family history includes Alzheimer's disease in her mother; Cancer in her father and sister; Diabetes in her father and mother; Diabetes (age of onset: 33) in her son; Diabetes (age of onset: 2) in her son; Heart disease in her father, maternal grandfather, and paternal grandmother; Kidney disease in her mother; Multiple sclerosis in her daughter.  ROS:   Please see the history of present illness.     All other systems reviewed and are negative.  EKGs/Labs/Other Studies Reviewed:    The following studies were reviewed today:   EKG: August 04, 2020: Normal sinus rhythm at 73.  ST segment depression in the inferior and lateral leads.  Recent Labs: 04/08/2020: ALT 8; BUN 19; Creatinine, Ser 0.81; Hemoglobin 13.7; Platelets 318.0; Potassium 4.5; Sodium 136; TSH 1.43  Recent Lipid Panel    Component Value Date/Time   CHOL 130 04/08/2020 1358   TRIG 193.0 (H) 04/08/2020 1358   HDL 41.10 04/08/2020 1358   CHOLHDL 3 04/08/2020 1358   VLDL 38.6 04/08/2020 1358   LDLCALC 50 04/08/2020 1358   LDLDIRECT 131.1 05/22/2014 1146     Risk Assessment/Calculations:       Physical Exam:    VS:  BP 100/60   Pulse 73   Ht 5' 3.5" (1.613 m)   Wt 150 lb 8 oz (68.3 kg)   SpO2 95%   BMI 26.24 kg/m     Wt Readings from Last 3 Encounters:  08/04/20 150 lb 8 oz (68.3 kg)   07/30/20 153 lb 4 oz (69.5 kg)  04/11/20 153 lb (69.4 kg)     GEN:  Middle age female,   HEENT:  Poor dentition NECK: No JVD; No carotid bruits LYMPHATICS: No lymphadenopathy CARDIAC: RRR, no murmurs, rubs, gallops RESPIRATORY:  Clear to auscultation without rales, wheezing or rhonchi  ABDOMEN: Soft, non-tender, non-distended MUSCULOSKELETAL:  No edema; No deformity ,  Poor distal foot pulses.  SKIN: Warm and dry NEUROLOGIC:  Alert and oriented x 3 PSYCHIATRIC:  Normal affect   ASSESSMENT:    1. Acute MI, inferior wall, initial episode of care [facility])    PLAN:    In order of problems listed above:  1. Preop visit prior to dental extraction:    Baya presents for preoperative evaluation prior to having dental extraction.  She has very poor dentition.  She is never had any episodes of chest pain.  She does have a claudication symptoms and dyspnea on exertion.  While not sure what her dyspnea is due to ,  I do think that she is able to get her teeth pulled without being at significant risk for cardiac risk factors.  She should receive propofol or similar anesthesia that do not cause serious cardiac depression .  We can better assess her cardiac status after she heals up from her dental extractions  She is at low risk for cardiac complications for her dental procedure   2.  Hx of CAD :   Has a hx of stenting in the past.   Is not having any angina .  Just severe dyspnea which is likely related to her COPD   Advised her to stop smoking.  Will need to  follow up with [place].       Shared Decision Making/Informed Consent      Medication Adjustments/Labs and Tests Ordered: Current medicines are reviewed at length with the patient today.  Concerns regarding medicines are outlined above.  Orders Placed This Encounter  Procedures  . EKG 12-Lead   No orders of the defined types were placed in this encounter.   Patient Instructions  Medication  Instructions:  Your physician recommends that you continue on your current medications as directed. Please refer to the Current Medication list given to you today.  *If you need a refill on your cardiac medications before your next appointment, please call your pharmacy*   Lab Work: none If you have labs (blood work) drawn today and your tests are completely normal, you will receive your results only by: Marland Kitchen. MyChart Message (if you have MyChart) OR . A paper copy in the mail If you have any lab test that is abnormal or we need to change your treatment, we will call you to review the results.   Testing/Procedures: NONE   Follow-Up: At [facility], you and your health needs are our priority.  As part of our continuing mission to provide you with exceptional heart care, we have created designated Provider Care Teams.  These Care Teams include your primary Cardiologist (physician) and Advanced Practice Providers (APPs -  Physician Assistants and Nurse Practitioners) who all work together to provide you with the care you need, when you need it.  We recommend signing up for the patient portal called "MyChart".  Sign up information is provided on this After Visit Summary.  MyChart is used to connect with patients for Virtual Visits (Telemedicine).  Patients are able to view lab/test results, encounter notes, upcoming appointments, etc.  Non-urgent messages can be sent to your provider as well.   To learn more about what you can do with MyChart, go to [email or web address].    Your next appointment:   [facility] [place]    Signed, Kristeen MissPhilip Jeilyn Reznik, MD  08/04/2020 2:47 PM    [facility]

## 2020-08-04 ENCOUNTER — Other Ambulatory Visit: Payer: Self-pay

## 2020-08-04 ENCOUNTER — Ambulatory Visit: Payer: Medicare HMO | Admitting: Cardiovascular Disease

## 2020-08-04 ENCOUNTER — Encounter: Payer: Self-pay | Admitting: Cardiovascular Disease

## 2020-08-04 VITALS — BP 100/60 | HR 73 | Ht 63.5 in | Wt 150.5 lb

## 2020-08-04 DIAGNOSIS — I739 Peripheral vascular disease, unspecified: Secondary | ICD-10-CM | POA: Diagnosis not present

## 2020-08-04 DIAGNOSIS — I2119 ST elevation (STEMI) myocardial infarction involving other coronary artery of inferior wall: Secondary | ICD-10-CM

## 2020-08-04 LAB — URINE CULTURE
MICRO NUMBER:: 11154886
SPECIMEN QUALITY:: ADEQUATE

## 2020-08-04 LAB — URINALYSIS

## 2020-08-04 NOTE — Patient Instructions (Signed)
Medication Instructions:  Your physician recommends that you continue on your current medications as directed. Please refer to the Current Medication list given to you today.  *If you need a refill on your cardiac medications before your next appointment, please call your pharmacy*   Lab Work: none If you have labs (blood work) drawn today and your tests are completely normal, you will receive your results only by: Marland Kitchen MyChart Message (if you have MyChart) OR . A paper copy in the mail If you have any lab test that is abnormal or we need to change your treatment, we will call you to review the results.   Testing/Procedures: NONE   Follow-Up: At [facility], you and your health needs are our priority.  As part of our continuing mission to provide you with exceptional heart care, we have created designated Provider Care Teams.  These Care Teams include your primary Cardiologist (physician) and Advanced Practice Providers (APPs -  Physician Assistants and Nurse Practitioners) who all work together to provide you with the care you need, when you need it.  We recommend signing up for the patient portal called "MyChart".  Sign up information is provided on this After Visit Summary.  MyChart is used to connect with patients for Virtual Visits (Telemedicine).  Patients are able to view lab/test results, encounter notes, upcoming appointments, etc.  Non-urgent messages can be sent to your provider as well.   To learn more about what you can do with MyChart, go to [email or web address].    Your next appointment:   FOLLOW UP WITH DR. HOCHREIN IN [place]

## 2020-08-05 ENCOUNTER — Other Ambulatory Visit: Payer: Self-pay | Admitting: Adult Health

## 2020-08-05 MED ORDER — AMOXICILLIN-POT CLAVULANATE 875-125 MG PO TABS
1.0000 | ORAL_TABLET | Freq: Two times a day (BID) | ORAL | 0 refills | Status: AC
Start: 2020-08-05 — End: 2020-08-10

## 2020-08-12 ENCOUNTER — Other Ambulatory Visit: Payer: Self-pay | Admitting: Adult Health

## 2020-08-12 ENCOUNTER — Encounter: Payer: Self-pay | Admitting: Adult Health

## 2020-08-12 DIAGNOSIS — I10 Essential (primary) hypertension: Secondary | ICD-10-CM

## 2020-08-13 ENCOUNTER — Ambulatory Visit: Payer: Medicare HMO | Admitting: Adult Health

## 2020-08-14 ENCOUNTER — Other Ambulatory Visit: Payer: Self-pay | Admitting: Adult Health

## 2020-08-14 DIAGNOSIS — M797 Fibromyalgia: Secondary | ICD-10-CM

## 2020-08-14 MED ORDER — BUSPIRONE HCL 15 MG PO TABS: 15.0000 mg | ORAL_TABLET | Freq: Three times a day (TID) | ORAL | 1 refills | Status: AC

## 2020-08-14 MED ORDER — AMITRIPTYLINE HCL 50 MG PO TABS: 50.0000 mg | ORAL_TABLET | Freq: Every day | ORAL | 1 refills | Status: DC

## 2020-08-14 MED ORDER — CYCLOBENZAPRINE HCL 10 MG PO TABS: 100.0000 mg | ORAL_TABLET | Freq: Every evening | ORAL | 0 refills | Status: DC | PRN

## 2020-08-14 MED ORDER — NITROGLYCERIN 0.4 MG SL SUBL: 0.4000 mg | SUBLINGUAL_TABLET | SUBLINGUAL | 12 refills | Status: DC | PRN

## 2020-08-20 ENCOUNTER — Encounter: Payer: Self-pay | Admitting: Adult Health

## 2020-08-28 ENCOUNTER — Telehealth: Payer: Self-pay | Admitting: Adult Health

## 2020-08-28 NOTE — Telephone Encounter (Signed)
Left message for patient to call back and schedule Medicare Annual Wellness Visit (AWV) either virtually or in office.   Last AWV ; no history  please schedule at anytime with [facility] Nurse Health Advisor 1 or 2   This should be a 45 minute visit.

## 2020-09-09 ENCOUNTER — Other Ambulatory Visit: Payer: Self-pay

## 2020-09-09 DIAGNOSIS — M797 Fibromyalgia: Secondary | ICD-10-CM

## 2020-09-09 MED ORDER — CYCLOBENZAPRINE HCL 10 MG PO TABS
10.0000 mg | ORAL_TABLET | Freq: Every evening | ORAL | 3 refills | Status: DC | PRN
Start: 2020-09-09 — End: 2021-01-14

## 2020-09-30 ENCOUNTER — Other Ambulatory Visit: Payer: Self-pay | Admitting: Adult Health

## 2020-10-01 NOTE — Telephone Encounter (Signed)
Left a message for a return call.

## 2020-10-01 NOTE — Telephone Encounter (Signed)
NEEDS CPX 

## 2020-10-03 NOTE — Telephone Encounter (Signed)
I spoke to the pt and scheduled her for a cpx.  Sent to the pharmacy by e-scribe.  Nothing further needed.

## 2020-10-25 ENCOUNTER — Other Ambulatory Visit: Payer: Self-pay | Admitting: Adult Health

## 2020-10-25 DIAGNOSIS — I10 Essential (primary) hypertension: Secondary | ICD-10-CM

## 2020-10-28 NOTE — Telephone Encounter (Signed)
WILL HOLD.  PT HAS APPT ON 10/30/20.

## 2020-10-30 ENCOUNTER — Ambulatory Visit (INDEPENDENT_AMBULATORY_CARE_PROVIDER_SITE_OTHER): Payer: Medicare Other | Admitting: Adult Health

## 2020-10-30 ENCOUNTER — Ambulatory Visit (INDEPENDENT_AMBULATORY_CARE_PROVIDER_SITE_OTHER): Payer: Medicare Other

## 2020-10-30 ENCOUNTER — Encounter: Payer: Self-pay | Admitting: Adult Health

## 2020-10-30 ENCOUNTER — Other Ambulatory Visit: Payer: Self-pay

## 2020-10-30 VITALS — BP 152/86 | Temp 98.1°F | Ht 63.75 in | Wt 156.0 lb

## 2020-10-30 DIAGNOSIS — Z0000 Encounter for general adult medical examination without abnormal findings: Secondary | ICD-10-CM | POA: Diagnosis not present

## 2020-10-30 DIAGNOSIS — R569 Unspecified convulsions: Secondary | ICD-10-CM

## 2020-10-30 DIAGNOSIS — M797 Fibromyalgia: Secondary | ICD-10-CM | POA: Diagnosis not present

## 2020-10-30 DIAGNOSIS — E1165 Type 2 diabetes mellitus with hyperglycemia: Secondary | ICD-10-CM

## 2020-10-30 DIAGNOSIS — R059 Cough, unspecified: Secondary | ICD-10-CM

## 2020-10-30 DIAGNOSIS — I2510 Atherosclerotic heart disease of native coronary artery without angina pectoris: Secondary | ICD-10-CM | POA: Diagnosis not present

## 2020-10-30 DIAGNOSIS — F32A Depression, unspecified: Secondary | ICD-10-CM

## 2020-10-30 DIAGNOSIS — F17200 Nicotine dependence, unspecified, uncomplicated: Secondary | ICD-10-CM | POA: Diagnosis not present

## 2020-10-30 DIAGNOSIS — E782 Mixed hyperlipidemia: Secondary | ICD-10-CM

## 2020-10-30 DIAGNOSIS — I10 Essential (primary) hypertension: Secondary | ICD-10-CM

## 2020-10-30 LAB — CBC WITH DIFFERENTIAL/PLATELET
Basophils Absolute: 0.2 10*3/uL — ABNORMAL HIGH (ref 0.0–0.1)
Basophils Relative: 1 % (ref 0.0–3.0)
Eosinophils Absolute: 0.6 10*3/uL (ref 0.0–0.7)
Eosinophils Relative: 4.1 % (ref 0.0–5.0)
HCT: 42.4 % (ref 36.0–46.0)
Hemoglobin: 14.1 g/dL (ref 12.0–15.0)
Lymphocytes Relative: 19.7 % (ref 12.0–46.0)
Lymphs Abs: 3 10*3/uL (ref 0.7–4.0)
MCHC: 33.1 g/dL (ref 30.0–36.0)
MCV: 91.8 fl (ref 78.0–100.0)
Monocytes Absolute: 1.3 10*3/uL — ABNORMAL HIGH (ref 0.1–1.0)
Monocytes Relative: 8.4 % (ref 3.0–12.0)
Neutro Abs: 10.3 10*3/uL — ABNORMAL HIGH (ref 1.4–7.7)
Neutrophils Relative %: 66.8 % (ref 43.0–77.0)
Platelets: 319 10*3/uL (ref 150.0–400.0)
RBC: 4.62 Mil/uL (ref 3.87–5.11)
RDW: 15 % (ref 11.5–15.5)
WBC: 15.4 10*3/uL — ABNORMAL HIGH (ref 4.0–10.5)

## 2020-10-30 LAB — COMPREHENSIVE METABOLIC PANEL
ALT: 15 U/L (ref 0–35)
AST: 15 U/L (ref 0–37)
Albumin: 3.8 g/dL (ref 3.5–5.2)
Alkaline Phosphatase: 88 U/L (ref 39–117)
BUN: 14 mg/dL (ref 6–23)
CO2: 24 mEq/L (ref 19–32)
Calcium: 10.8 mg/dL — ABNORMAL HIGH (ref 8.4–10.5)
Chloride: 108 mEq/L (ref 96–112)
Creatinine, Ser: 0.89 mg/dL (ref 0.40–1.20)
GFR: 69.36 mL/min (ref >60.00)
Glucose, Bld: 101 mg/dL — ABNORMAL HIGH (ref 70–99)
Potassium: 4.6 mEq/L (ref 3.5–5.1)
Sodium: 140 mEq/L (ref 135–145)
Total Bilirubin: 0.2 mg/dL (ref 0.2–1.2)
Total Protein: 6.6 g/dL (ref 6.0–8.3)

## 2020-10-30 LAB — LIPID PANEL
Cholesterol: 146 mg/dL (ref 0–200)
HDL: 44.3 mg/dL (ref >39.00)
LDL Cholesterol: 69 mg/dL (ref 0–99)
NonHDL: 101.9
Total CHOL/HDL Ratio: 3
Triglycerides: 167 mg/dL — ABNORMAL HIGH (ref 0.0–149.0)
VLDL: 33.4 mg/dL (ref 0.0–40.0)

## 2020-10-30 LAB — TSH: TSH: 2.33 u[IU]/mL (ref 0.35–4.50)

## 2020-10-30 LAB — HEMOGLOBIN A1C: Hgb A1c MFr Bld: 8.3 % — ABNORMAL HIGH (ref 4.6–6.5)

## 2020-10-30 MED ORDER — NYSTATIN 100000 UNIT/GM EX CREA: 1.0000 "application " | TOPICAL_CREAM | Freq: Two times a day (BID) | CUTANEOUS | 2 refills | Status: DC

## 2020-10-30 MED ORDER — HYDROCHLOROTHIAZIDE 12.5 MG PO CAPS: 12.5000 mg | ORAL_CAPSULE | Freq: Every day | ORAL | 0 refills | Status: DC

## 2020-10-30 NOTE — Progress Notes (Signed)
Subjective:    Patient ID: [name], female    DOB: [birth year], 65 y.o.   MRN: [id number]  HPI Patient presents for yearly preventative medicine examination. She is a pleasant 65 year old female who  has a past medical history of Acute MI, inferior wall, initial episode of care (HCC) (07/08/2012), Anxiety, Asthma, Coronary artery disease, Depression, Diabetes mellitus, Fibromyalgia, Leukocytosis, Monocytosis, Peripheral arterial disease (HCC) (01/23/2013), and Tobacco use disorder (07/08/2012).  DM -was last seen on July 30, 2020.  At this time she was maintained on Metformin 100 mg twice daily, glipizide 5 mg twice daily, and Lantus 16 units nightly.  She had been using the [place] system to monitor her blood sugars and reported readings in the 120s to 130s during the day but after dinner her readings would spike towards the 180s to 200s.  Her A1c improved from 8.5-7.6.  Her libre log showed that she was spending about 53% of the time at goal with most of her time being hyperglycemic between 6 PM and 2 AM.  We added 8 units of Lantus in the morning to see if we can get her blood sugars under better control.`She reports that she did notice improvement in her blood sugars but her libre sensors have been falling off.   Hyperlipidemia/CAD -history of MI in 2012.  At this time she had a cardiac catheterization which demonstrated 20% proximal stenosis and 36 mild stenosis in her LAD.  The circumflex had 20% proximal stenosis.  She denies chest pain.  Continues to smoke. Does have DOE but when last seen by Cardiology for surgical clearance this was to be due to COPD.  She was advised to follow-up with her old cardiologist, Dr. Antoine Poche has not done so.  Currently prescribed pravastatin 40 mg and Imdur 80 mg daily Lab Results  Component Value Date   CHOL 130 04/08/2020   HDL 41.10 04/08/2020   LDLCALC 50 04/08/2020   LDLDIRECT 131.1 05/22/2014   TRIG 193.0 (H) 04/08/2020   CHOLHDL 3 04/08/2020    Depression - Prescribed Buspar 30 mg in the morning and 15 mg in the evening, as well as prozac 40 mg daily. She does report that her depression is getting worse and she would like to see a psychiatrist. Denies SI or HI   Hypertension -currently prescribed propanolol 40 mg twice daily, losartan 12.5   mg daily.  She denies dizziness, lightheadedness, chest pain, shortness of breath. She reports that her blood pressure has been high at home ( in the 140-150's systolic(  but she has also been out of her propanolol. She also does not like the way Losartan makes her feel, she feels as though it makes her feel fatigued.   BP Readings from Last 3 Encounters:  10/30/20 (!) 152/86  08/04/20 100/60  07/30/20 (!) 144/94    History of seizures-takes Keppra 250 mg 3 times a day.  Has not had any recent seizure activity  Fibromyalgia-takes Lyrica 100 mg twice daily, Elavil 50 mg nightly as well as Flexeril 10 mg nightly as needed.  Acute Issues  1.  Report  that over the last 3 weeks she has had a productive cough with sputum of varying colors.  She also feels more short of breath.  Fortunately she does continue to cook smoke.  She denies fevers, or chills.    All immunizations and health maintenance protocols were reviewed with the patient and needed orders were placed.  Appropriate screening laboratory values were ordered for  the patient including screening of hyperlipidemia, renal function and hepatic function. If indicated by BPH, a PSA was ordered.  Medication reconciliation,  past medical history, social history, problem list and allergies were reviewed in detail with the patient  Goals were established with regard to weight loss, exercise, and  diet in compliance with medications  Review of Systems  Constitutional: Negative.   HENT: Negative.   Eyes: Negative.   Respiratory: Negative.   Cardiovascular: Negative.   Gastrointestinal: Negative.   Endocrine: Negative.   Genitourinary:  Negative.   Musculoskeletal: Positive for arthralgias and myalgias.  Skin: Negative.   Allergic/Immunologic: Negative.   Neurological: Negative.   Hematological: Negative.   Psychiatric/Behavioral: Negative.    Past Medical History:  Diagnosis Date  . Acute MI, inferior wall, initial episode of care [facility]) 07/08/2012   Cath-07/08/11 -distal RCA stent DES (99%), LAD 20-30%. EF 50% inferior hypokinesis  (infarct)  . Anxiety   . Asthma   . Coronary artery disease   . Depression   . Diabetes mellitus   . Fibromyalgia   . Leukocytosis   . Monocytosis   . Peripheral arterial disease (HCC) 01/23/2013  . Tobacco use disorder 07/08/2012    Social History   Socioeconomic History  . Marital status: Married    Spouse name: Not on file  . Number of children: 3  . Years of education: college  . Highest education level: Not on file  Occupational History  . Not on file  Tobacco Use  . Smoking status: Current Every Day Smoker    Packs/day: 1.00    Years: 43.00    Pack years: 43.00    Types: Cigarettes  . Smokeless tobacco: Never Used  Substance and Sexual Activity  . Alcohol use: No    Alcohol/week: 0.0 standard drinks    Comment: Rarely  . Drug use: No  . Sexual activity: Yes  Other Topics Concern  . Not on file  Social History Narrative   Lives with husband, married for 20 years.      They have three grown chlildren.   She is not currently working and is on disability   Highest level of education:  Engineer, maintenance (IT)   Pets: two dogs and three rabbits.       Husband is a long Production assistant, radio.       Is unable to enjoy any activities because of body pains.       Social Determinants of Health   Financial Resource Strain: Not on file  Food Insecurity: Not on file  Transportation Needs: Not on file  Physical Activity: Not on file  Stress: Not on file  Social Connections: Not on file  Intimate Partner Violence: Not on file    Past Surgical History:  Procedure  Laterality Date  . ABDOMINAL AORTAGRAM N/A 02/07/2013   Procedure: ABDOMINAL Ronny Flurry;  Surgeon: Iran Ouch, MD;  Location: [facility] CATH LAB;  Service: Cardiovascular;  Laterality: N/A;  . CARDIAC CATHETERIZATION    . CORONARY STENT PLACEMENT    . CYST REMOVAL TRUNK  1977   tailbone  . LEFT HEART CATHETERIZATION WITH CORONARY ANGIOGRAM N/A 07/07/2012   Procedure: LEFT HEART CATHETERIZATION WITH CORONARY ANGIOGRAM;  Surgeon: Kathleene Hazel, MD;  Location: [facility] CATH LAB;  Service: Cardiovascular;  Laterality: N/A;  . PERCUTANEOUS CORONARY STENT INTERVENTION (PCI-S)  07/07/2012   Procedure: PERCUTANEOUS CORONARY STENT INTERVENTION (PCI-S);  Surgeon: Kathleene Hazel, MD;  Location: [facility] CATH LAB;  Service: Cardiovascular;;  . TONSILLECTOMY  Family History  Problem Relation Age of Onset  . Kidney disease Mother   . Diabetes Mother        DM Type II  . Alzheimer's disease Mother   . Diabetes Father        IDDM  . Heart disease Father   . Cancer Father        bone cancer died at 3152  . Cancer Sister        bone cancer at age 63  . Multiple sclerosis Daughter   . Diabetes Son 2       DM Type I  . Heart disease Maternal Grandfather   . Heart disease Paternal Grandmother   . Diabetes Son 2213       DM Type I    Allergies  Allergen Reactions  . Erythromycin Other (See Comments)  . Statins     SEVERE MUSCLE PAIN - failed lipitor, Livalo, red yeast rice (lovastatin), and she thinks Zocor and pravastatin  . Ace Inhibitors Cough    Patient refuses to take meds  . Cymbalta [Duloxetine Hcl]     Drowsiness    Current Outpatient Medications on File Prior to Visit  Medication Sig Dispense Refill  . amitriptyline (ELAVIL) 50 MG tablet Take 1 tablet (50 mg total) by mouth at bedtime. 90 tablet 1  . busPIRone (BUSPAR) 15 MG tablet Take 1 tablet (15 mg total) by mouth 3 (three) times daily. 270 tablet 1  . Cholecalciferol (VITAMIN D3) 2000 units TABS Take 1 tablet by mouth  daily.    . Continuous Blood Gluc Receiver (FREESTYLE LIBRE 14 DAY READER) DEVI Use with libre system 1 each 0  . Continuous Blood Gluc Sensor (FREESTYLE LIBRE 14 DAY SENSOR) MISC Use with freestyle libre reader 2 each 3  . cyclobenzaprine (FLEXERIL) 10 MG tablet Take 1 tablet (10 mg total) by mouth at bedtime as needed. 30 tablet 3  . FLUoxetine (PROZAC) 40 MG capsule Take 1 capsule by mouth daily.    Marland Kitchen. glipiZIDE (GLUCOTROL) 10 MG tablet Take 10 mg by mouth 2 (two) times daily before a meal.    . insulin glargine (LANTUS SOLOSTAR) 100 UNIT/ML Solostar Pen Inject 16 Units into the skin at bedtime. 15 mL 1  . Insulin Pen Needle 29G X 12.7MM MISC Use as directed 90 each 1  . isosorbide mononitrate (IMDUR) 60 MG 24 hr tablet Take 1.5 tablets (90 mg total) by mouth daily. 135 tablet 3  . losartan (COZAAR) 25 MG tablet Take 0.5 tablets (12.5 mg total) by mouth daily. 45 tablet 1  . metFORMIN (GLUCOPHAGE) 1000 MG tablet Take 1 tablet (1,000 mg total) by mouth 2 (two) times daily with a meal. 180 tablet 1  . nitroGLYCERIN (NITROSTAT) 0.4 MG SL tablet Place 1 tablet (0.4 mg total) under the tongue every 5 (five) minutes x 3 doses as needed for chest pain. 30 tablet 12  . nystatin cream (MYCOSTATIN) Apply 1 application topically 2 (two) times daily.    . pravastatin (PRAVACHOL) 40 MG tablet TAKE 1 TABLET BY MOUTH AT BEDTIME 90 tablet 0  . propranolol (INDERAL) 40 MG tablet Take 1 tablet by mouth twice daily 90 tablet 0  . RELION PEN NEEDLES 31G X 6 MM MISC 1 each by Other route as directed.    . levETIRAcetam (KEPPRA) 250 MG tablet Take 1 tablet (250 mg total) by mouth in the morning, at noon, and at bedtime. 270 tablet 0  . pregabalin (LYRICA) 100 MG capsule Take  1 capsule (100 mg total) by mouth 2 (two) times daily. 180 capsule 0   No current facility-administered medications on file prior to visit.    BP (!) 152/86   Temp 98.1 F (36.7 C)   Ht 5' 3.75" (1.619 m)   Wt 156 lb (70.8 kg)   BMI 26.99  kg/m       Objective:   Physical Exam Vitals and nursing note reviewed.  Constitutional:      General: She is not in acute distress.    Appearance: Normal appearance. She is well-developed. She is not ill-appearing.  HENT:     Head: Normocephalic and atraumatic.     Right Ear: Tympanic membrane, ear canal and external ear normal. There is no impacted cerumen.     Left Ear: Tympanic membrane, ear canal and external ear normal. There is no impacted cerumen.     Nose: Nose normal. No congestion or rhinorrhea.     Mouth/Throat:     Mouth: Mucous membranes are moist.     Dentition: Abnormal dentition.     Pharynx: Oropharynx is clear. No oropharyngeal exudate or posterior oropharyngeal erythema.  Eyes:     General:        Right eye: No discharge.        Left eye: No discharge.     Extraocular Movements: Extraocular movements intact.     Conjunctiva/sclera: Conjunctivae normal.     Pupils: Pupils are equal, round, and reactive to light.  Neck:     Thyroid: No thyromegaly.     Vascular: No carotid bruit.     Trachea: No tracheal deviation.  Cardiovascular:     Rate and Rhythm: Normal rate and regular rhythm.     Pulses: Normal pulses.     Heart sounds: Normal heart sounds. No murmur heard. No friction rub. No gallop.   Pulmonary:     Effort: Pulmonary effort is normal. No respiratory distress.     Breath sounds: Normal breath sounds. No stridor. No wheezing, rhonchi or rales.  Chest:     Chest wall: No tenderness.  Abdominal:     General: Abdomen is flat. Bowel sounds are normal. There is no distension.     Palpations: Abdomen is soft. There is no mass.     Tenderness: There is no abdominal tenderness. There is no right CVA tenderness, left CVA tenderness, guarding or rebound.     Hernia: No hernia is present.  Musculoskeletal:        General: No swelling, tenderness, deformity or signs of injury. Normal range of motion.     Cervical back: Normal range of motion and neck  supple.     Right lower leg: 1+ Pitting Edema present.     Left lower leg: 1+ Pitting Edema present.  Lymphadenopathy:     Cervical: No cervical adenopathy.  Skin:    General: Skin is warm and dry.     Capillary Refill: Capillary refill takes less than 2 seconds.     Coloration: Skin is not jaundiced or pale.     Findings: No bruising, erythema, lesion or rash.  Neurological:     General: No focal deficit present.     Mental Status: She is alert and oriented to person, place, and time.     Cranial Nerves: No cranial nerve deficit.     Sensory: No sensory deficit.     Motor: No weakness.     Coordination: Coordination normal.     Gait: Gait normal.  Deep Tendon Reflexes: Reflexes normal.  Psychiatric:        Mood and Affect: Mood normal.        Behavior: Behavior normal.        Thought Content: Thought content normal.        Judgment: Judgment normal.       Assessment & Plan:  1. Routine general medical examination at a health care facility - Follow up in one year or sooner if needed - CBC with Differential/Platelet; Future - Comprehensive metabolic panel; Future - Hemoglobin A1c; Future - Lipid panel; Future - TSH; Future - TSH - Lipid panel - Hemoglobin A1c - Comprehensive metabolic panel - CBC with Differential/Platelet  2. Fibromyalgia - Continue with Lyrica and elavil   3. Uncontrolled type 2 diabetes mellitus with hyperglycemia (HCC) - Consider adding agent  - CBC with Differential/Platelet; Future - Comprehensive metabolic panel; Future - Hemoglobin A1c; Future - Lipid panel; Future - TSH; Future - TSH - Lipid panel - Hemoglobin A1c - Comprehensive metabolic panel - CBC with Differential/Platelet  4. Mixed hyperlipidemia - Continue statin  - CBC with Differential/Platelet; Future - Comprehensive metabolic panel; Future - Hemoglobin A1c; Future - Lipid panel; Future - TSH; Future - TSH - Lipid panel - Hemoglobin A1c - Comprehensive metabolic  panel - CBC with Differential/Platelet  5. Depression, unspecified depression type  - Ambulatory referral to Psychiatry  6. Coronary artery disease involving native coronary artery of native heart without angina pectoris - Continue statin  - Ambulatory referral to Cardiology - CBC with Differential/Platelet; Future - Comprehensive metabolic panel; Future - Hemoglobin A1c; Future - Lipid panel; Future - TSH; Future - TSH - Lipid panel - Hemoglobin A1c - Comprehensive metabolic panel - CBC with Differential/Platelet  7. Essential hypertension -Propanolol  sent in earlier today.  We will have her stop losartan and switch her over to hydrochlorothiazide.  She will follow-up in 1 month - hydrochlorothiazide (MICROZIDE) 12.5 MG capsule; Take 1 capsule (12.5 mg total) by mouth daily.  Dispense: 90 capsule; Refill: 0 - CBC with Differential/Platelet; Future - Comprehensive metabolic panel; Future - Hemoglobin A1c; Future - Lipid panel; Future - TSH; Future - TSH - Lipid panel - Hemoglobin A1c - Comprehensive metabolic panel - CBC with Differential/Platelet  8. Seizures (HCC)  - Levetiracetam level; Future - Levetiracetam level  9. Tobacco use disorder - Needs to quit smoking   10. Cough -  COPD vs PNA - DG Chest 2 View; Future

## 2020-10-30 NOTE — Patient Instructions (Signed)
We will follow up with you regarding your blood work and xray   Someone from cardiology and psychiatry will call you to schedule your appointments   Stop Losartan and start HCTZ 12.5 mg   Please follow up in one month

## 2020-10-31 ENCOUNTER — Telehealth: Payer: Self-pay | Admitting: Adult Health

## 2020-10-31 MED ORDER — FLUTICASONE-SALMETEROL 250-50 MCG/DOSE IN AEPB
1.0000 | INHALATION_SPRAY | Freq: Two times a day (BID) | RESPIRATORY_TRACT | 3 refills | Status: DC
Start: 2020-10-31 — End: 2020-12-24

## 2020-10-31 NOTE — Telephone Encounter (Signed)
Updated patient on her labs, A1c increased. She wants to work on lifestyle modifications before adding medications   Chest xray clear   Will add Wixela for COPD management

## 2020-11-04 LAB — LEVETIRACETAM LEVEL: Keppra (Levetiracetam): 10.7 ug/mL — ABNORMAL LOW (ref 12.0–46.0)

## 2020-11-06 ENCOUNTER — Encounter: Payer: Self-pay | Admitting: Family Medicine

## 2020-11-21 ENCOUNTER — Other Ambulatory Visit: Payer: Self-pay | Admitting: Adult Health

## 2020-11-21 DIAGNOSIS — M797 Fibromyalgia: Secondary | ICD-10-CM

## 2020-12-23 DIAGNOSIS — I2510 Atherosclerotic heart disease of native coronary artery without angina pectoris: Secondary | ICD-10-CM | POA: Insufficient documentation

## 2020-12-23 DIAGNOSIS — E118 Type 2 diabetes mellitus with unspecified complications: Secondary | ICD-10-CM | POA: Insufficient documentation

## 2020-12-23 NOTE — Progress Notes (Signed)
Cardiology Office Note   Date:  12/24/2020   ID:  [name], DOB January 13, 1956, MRN [id number]  PCP:  Sydney Frees, NP  Cardiologist:   Sydney Rotunda, MD Referring:  Sydney Frees, NP  Chief Complaint  Patient presents with  . Shortness of Breath      History of Present Illness: [name] is a 65 y.o. female who presents for a history of CAD.  He had a stent to the PDA in 2013. I saw him in 2013.  She was most recently seen by Dr. Elease Riddle in 2021.  This was before dental extraction.  He is moving here because of proximity.  She is due to have multiple teeth removed with dental surgery.  She complains of predominantly shortness of breath.  This was symptoms he had she had her angioplasty.  She says she is short of breath doing normal activities.  She is not describing PND or orthopnea.  She does occasionally get some chest discomfort.  She can really quantify or qualify this.  She had to use about 6-8 nitroglycerin over the past month.  This might be a somewhat stable pattern.  She is not getting this discomfort with her level of exertion that she can she is.  She had COVID few months ago.  She thought this was mild although she had a cough and has had a residual dry cough since then.  He still smokes cigarettes.  Of note she said she was seen at the [facility] at one point and was told she had some abnormal pulmonary function test but I do not have these results.  She was supposed to follow-up with a pulmonologist but they never arrange this.  She is no longer at the [place].   Past Medical History:  Diagnosis Date  . Acute MI, inferior wall, initial episode of care [facility]) 07/08/2012   Cath-07/08/11 -distal RCA stent DES (99%), LAD 20-30%. EF 50% inferior hypokinesis  (infarct)  . Anxiety   . Asthma   . Coronary artery disease   . Depression   . Diabetes mellitus   . Fibromyalgia   . Leukocytosis   . Monocytosis   . Peripheral arterial disease (HCC) 01/23/2013  . Tobacco use  disorder 07/08/2012    Past Surgical History:  Procedure Laterality Date  . ABDOMINAL AORTAGRAM N/A 02/07/2013   Procedure: ABDOMINAL Ronny Flurry;  Surgeon: Iran Ouch, MD;  Location: [facility] CATH LAB;  Service: Cardiovascular;  Laterality: N/A;  . CARDIAC CATHETERIZATION    . CORONARY STENT PLACEMENT    . CYST REMOVAL TRUNK  1977   tailbone  . LEFT HEART CATHETERIZATION WITH CORONARY ANGIOGRAM N/A 07/07/2012   Procedure: LEFT HEART CATHETERIZATION WITH CORONARY ANGIOGRAM;  Surgeon: Kathleene Hazel, MD;  Location: [facility] CATH LAB;  Service: Cardiovascular;  Laterality: N/A;  . PERCUTANEOUS CORONARY STENT INTERVENTION (PCI-S)  07/07/2012   Procedure: PERCUTANEOUS CORONARY STENT INTERVENTION (PCI-S);  Surgeon: Kathleene Hazel, MD;  [facility] CATH LAB;  Service: Cardiovascular;;  . TONSILLECTOMY       Current Outpatient Medications  Medication Sig Dispense Refill  . amitriptyline (ELAVIL) 50 MG tablet Take 1 tablet (50 mg total) by mouth at bedtime. 90 tablet 1  . cilostazol (PLETAL) 100 MG tablet Take 50 mg by mouth 2 (two) times daily.    . cyclobenzaprine (FLEXERIL) 10 MG tablet Take 1 tablet (10 mg total) by mouth at bedtime as needed. 30 tablet 3  . FLUoxetine (PROZAC) 40 MG capsule Take 1 capsule  by mouth once daily 90 capsule 1  . glipiZIDE (GLUCOTROL) 10 MG tablet Take 10 mg by mouth 2 (two) times daily before a meal.    . hydrochlorothiazide (MICROZIDE) 12.5 MG capsule Take 1 capsule (12.5 mg total) by mouth daily. 90 capsule 0  . insulin glargine (LANTUS SOLOSTAR) 100 UNIT/ML Solostar Pen Inject 16 Units into the skin at bedtime. 15 mL 1  . isosorbide mononitrate (IMDUR) 60 MG 24 hr tablet Take 1.5 tablets (90 mg total) by mouth daily. 135 tablet 3  . levETIRAcetam (KEPPRA) 250 MG tablet Take 1 tablet (250 mg total) by mouth in the morning, at noon, and at bedtime. (Patient taking differently: Take 500 mg by mouth 2 (two) times daily.) 270 tablet 0  . metFORMIN  (GLUCOPHAGE) 1000 MG tablet Take 1 tablet (1,000 mg total) by mouth 2 (two) times daily with a meal. 180 tablet 1  . nitroGLYCERIN (NITROSTAT) 0.4 MG SL tablet Place 1 tablet (0.4 mg total) under the tongue every 5 (five) minutes x 3 doses as needed for chest pain. 30 tablet 12  . nystatin cream (MYCOSTATIN) Apply 1 application topically 2 (two) times daily. 30 g 2  . pravastatin (PRAVACHOL) 40 MG tablet TAKE 1 TABLET BY MOUTH AT BEDTIME 90 tablet 0  . pregabalin (LYRICA) 100 MG capsule Take 1 capsule by mouth twice daily 180 capsule 0  . propranolol (INDERAL) 40 MG tablet Take 1 tablet (40 mg total) by mouth 2 (two) times daily. 180 tablet 3  . busPIRone (BUSPAR) 15 MG tablet Take 15 mg by mouth 3 (three) times daily.    . Cholecalciferol (VITAMIN D3) 2000 units TABS Take 1 tablet by mouth daily. (Patient not taking: Reported on 12/24/2020)    . Continuous Blood Gluc Receiver (FREESTYLE LIBRE 14 DAY READER) DEVI Use with libre system 1 each 0  . Continuous Blood Gluc Sensor (FREESTYLE LIBRE 14 DAY SENSOR) MISC Use with freestyle libre reader 2 each 3  . Insulin Pen Needle 29G X 12.7MM MISC Use as directed 90 each 1  . RELION PEN NEEDLES 31G X 6 MM MISC 1 each by Other route as directed.     No current facility-administered medications for this visit.    Allergies:   Erythromycin, Statins, Ace inhibitors, and Cymbalta [duloxetine hcl]    ROS:  Please see the history of present illness.   Otherwise, review of systems are positive for none.   All other systems are reviewed and negative.    PHYSICAL EXAM: VS:  BP 100/72   Pulse 75   Ht 5\' 3"  (1.6 m)   Wt 153 lb (69.4 kg)   BMI 27.10 kg/m  , BMI Body mass index is 27.1 kg/m. GENERAL:  Well appearing NECK:  No jugular venous distention, waveform within normal limits, carotid upstroke brisk and symmetric, no bruits, no thyromegaly LUNGS: Decreased breath sounds without wheezing or crackles CHEST:  Unremarkable HEART:  PMI not displaced or  sustained,S1 and S2 within normal limits, no S3, no S4, no clicks, no rubs, no murmurs ABD:  Flat, positive bowel sounds normal in frequency in pitch, no bruits, no rebound, no guarding, no midline pulsatile mass, no hepatomegaly, no splenomegaly EXT:  2 plus pulses throughout, no edema, no cyanosis no clubbing   EKG:  EKG is ordered today. The ekg ordered today demonstrates sinus rhythm, rate 75, right bundle branch block, right axis deviation, right bundle branch block is new since previous   Recent Labs: 10/30/2020: ALT 15; BUN  14; Creatinine, Ser 0.89; Hemoglobin 14.1; Platelets 319.0; Potassium 4.6; Sodium 140; TSH 2.33    Lipid Panel    Component Value Date/Time   CHOL 146 10/30/2020 1338   TRIG 167.0 (H) 10/30/2020 1338   HDL 44.30 10/30/2020 1338   CHOLHDL 3 10/30/2020 1338   VLDL 33.4 10/30/2020 1338   LDLCALC 69 10/30/2020 1338   LDLDIRECT 131.1 05/22/2014 1146      Wt Readings from Last 3 Encounters:  12/24/20 153 lb (69.4 kg)  10/30/20 156 lb (70.8 kg)  08/04/20 150 lb 8 oz (68.3 kg)      Other studies Reviewed: Additional studies/ records that were reviewed today include: Labs. Review of the above records demonstrates:  Please see elsewhere in the note.     ASSESSMENT AND PLAN:  CAD:    She is having dyspnea.  I think this is likely pulmonary and I am going to make a referral as below.  However, to exclude obstructive coronary disease she will get a [employer].  DM: Her A1c was 8.3.  I will defer to Sydney Frees, NP  DYSPNEA: I will check a BNP level.  I am going to make a pulmonary referral.  We also talked about the need to stop smoking.  She is not 1/2 pack/day.  Current medicines are reviewed at length with the patient today.  The patient does not have concerns regarding medicines.  The following changes have been made:  no change  Labs/ tests ordered today include:   Orders Placed This Encounter  Procedures  . Pro b natriuretic peptide  (BNP)  . Ambulatory referral to Pulmonology  . MYOCARDIAL PERFUSION IMAGING  . EKG 12-Lead     Disposition:   FU with me in 1 year.   Signed, Sydney Rotunda, MD  12/24/2020 3:38 PM    [facility]

## 2020-12-24 ENCOUNTER — Encounter: Payer: Self-pay | Admitting: *Deleted

## 2020-12-24 ENCOUNTER — Encounter: Payer: Self-pay | Admitting: Cardiology

## 2020-12-24 ENCOUNTER — Other Ambulatory Visit: Payer: Self-pay

## 2020-12-24 ENCOUNTER — Ambulatory Visit: Payer: Medicare Other | Admitting: Cardiology

## 2020-12-24 VITALS — BP 100/72 | HR 75 | Ht 63.0 in | Wt 153.0 lb

## 2020-12-24 DIAGNOSIS — I2510 Atherosclerotic heart disease of native coronary artery without angina pectoris: Secondary | ICD-10-CM

## 2020-12-24 DIAGNOSIS — I739 Peripheral vascular disease, unspecified: Secondary | ICD-10-CM | POA: Diagnosis not present

## 2020-12-24 DIAGNOSIS — E118 Type 2 diabetes mellitus with unspecified complications: Secondary | ICD-10-CM | POA: Diagnosis not present

## 2020-12-24 DIAGNOSIS — R0602 Shortness of breath: Secondary | ICD-10-CM | POA: Diagnosis not present

## 2020-12-24 NOTE — Patient Instructions (Signed)
Medication Instructions:  The current medical regimen is effective;  continue present plan and medications.  *If you need a refill on your cardiac medications before your next appointment, please call your pharmacy*  Lab Work: Please have blood work (Pro-BNP) Can be drawn the same day as stress test.  If you have labs (blood work) drawn today and your tests are completely normal, you will receive your results only by: Marland Kitchen MyChart Message (if you have MyChart) OR . A paper copy in the mail If you have any lab test that is abnormal or we need to change your treatment, we will call you to review the results.  Testing/Procedures: Your physician has requested that you have a lexiscan myoview. For further information please visit [email or web address] Please follow instruction sheet, as given.  You have been referred to Pulmonary for the evaluation of shortness of breath.  You will be contacted to be scheduled.  Follow-Up: At [facility], you and your health needs are our priority.  As part of our continuing mission to provide you with exceptional heart care, we have created designated Provider Care Teams.  These Care Teams include your primary Cardiologist (physician) and Advanced Practice Providers (APPs -  Physician Assistants and Nurse Practitioners) who all work together to provide you with the care you need, when you need it.  We recommend signing up for the patient portal called "MyChart".  Sign up information is provided on this After Visit Summary.  MyChart is used to connect with patients for Virtual Visits (Telemedicine).  Patients are able to view lab/test results, encounter notes, upcoming appointments, etc.  Non-urgent messages can be sent to your provider as well.   To learn more about what you can do with MyChart, go to [email or web address].    Your next appointment:   12 month(s)  The format for your next appointment:   In Person  Provider:   Rollene Rotunda, MD   Thank  you for choosing [facility]!!

## 2020-12-24 NOTE — Addendum Note (Signed)
Addended by: Sharin Grave on: 12/24/2020 04:01 PM   Modules accepted: Orders

## 2020-12-26 NOTE — Addendum Note (Signed)
Addended by: Rollene Rotunda on: 12/26/2020 03:02 PM   Modules accepted: Orders

## 2020-12-31 ENCOUNTER — Encounter: Payer: Self-pay | Admitting: Adult Health

## 2021-01-01 ENCOUNTER — Encounter (HOSPITAL_COMMUNITY): Payer: Self-pay | Admitting: Cardiology

## 2021-01-07 ENCOUNTER — Other Ambulatory Visit: Payer: Self-pay | Admitting: Adult Health

## 2021-01-07 DIAGNOSIS — R569 Unspecified convulsions: Secondary | ICD-10-CM

## 2021-01-07 DIAGNOSIS — E1165 Type 2 diabetes mellitus with hyperglycemia: Secondary | ICD-10-CM

## 2021-01-12 ENCOUNTER — Other Ambulatory Visit: Payer: Self-pay

## 2021-01-12 DIAGNOSIS — R569 Unspecified convulsions: Secondary | ICD-10-CM

## 2021-01-12 DIAGNOSIS — E1165 Type 2 diabetes mellitus with hyperglycemia: Secondary | ICD-10-CM

## 2021-01-12 MED ORDER — LEVETIRACETAM 250 MG PO TABS: 250.0000 mg | ORAL_TABLET | Freq: Three times a day (TID) | ORAL | 0 refills | Status: DC

## 2021-01-12 MED ORDER — PRAVASTATIN SODIUM 40 MG PO TABS
40.0000 mg | ORAL_TABLET | Freq: Every day | ORAL | 0 refills | Status: DC
Start: 2021-01-12 — End: 2021-05-29

## 2021-01-13 ENCOUNTER — Other Ambulatory Visit: Payer: Self-pay | Admitting: Adult Health

## 2021-01-13 DIAGNOSIS — E1165 Type 2 diabetes mellitus with hyperglycemia: Secondary | ICD-10-CM

## 2021-01-14 NOTE — Telephone Encounter (Signed)
DM f/u scheduled on 01/30/21, flexeril last filled on 09/09/20 #30 tabs with 3 refills

## 2021-01-15 ENCOUNTER — Telehealth (HOSPITAL_COMMUNITY): Payer: Self-pay | Admitting: Cardiology

## 2021-01-15 ENCOUNTER — Other Ambulatory Visit: Payer: Self-pay | Admitting: Adult Health

## 2021-01-15 DIAGNOSIS — E1165 Type 2 diabetes mellitus with hyperglycemia: Secondary | ICD-10-CM

## 2021-01-15 NOTE — Telephone Encounter (Signed)
Just an FYI. We have made several attempts to contact this patient including sending a letter to schedule or reschedule their Myoview. We will be removing the patient from the echo/NUC WQ.    01/01/21 MAILED LETTER [facility]  01/01/21 [facility] to schedule @ 10:34/[facility]  12/29/20 [facility] to schedule @ 10:55/LBW  12/25/20 [facility] to schedule @ NL 8:38/LBW     Thank you

## 2021-01-15 NOTE — Telephone Encounter (Signed)
Will forward to MD to make aware.  

## 2021-01-16 ENCOUNTER — Other Ambulatory Visit: Payer: Self-pay | Admitting: Adult Health

## 2021-01-16 MED ORDER — CYCLOBENZAPRINE HCL 10 MG PO TABS: 10.0000 mg | ORAL_TABLET | Freq: Every day | ORAL | 0 refills | Status: DC

## 2021-01-16 NOTE — Telephone Encounter (Signed)
Attempted to contact patient to notify her that her refill for her blood glucose sensor has been refilled and wanted to verify if she picked this up.

## 2021-01-27 ENCOUNTER — Telehealth (HOSPITAL_COMMUNITY): Payer: Self-pay

## 2021-01-27 NOTE — Telephone Encounter (Signed)
Detailed instructions left on the patient's answering machine. Asked to call back with any questions. S.Taysean Wager EMTP 

## 2021-01-29 ENCOUNTER — Other Ambulatory Visit: Payer: Self-pay

## 2021-01-29 ENCOUNTER — Other Ambulatory Visit: Payer: Medicare Other

## 2021-01-29 ENCOUNTER — Ambulatory Visit (HOSPITAL_COMMUNITY): Payer: Medicare Other | Attending: Cardiology

## 2021-01-29 DIAGNOSIS — R0602 Shortness of breath: Secondary | ICD-10-CM | POA: Diagnosis not present

## 2021-01-29 DIAGNOSIS — I2510 Atherosclerotic heart disease of native coronary artery without angina pectoris: Secondary | ICD-10-CM | POA: Insufficient documentation

## 2021-01-29 LAB — MYOCARDIAL PERFUSION IMAGING
LV dias vol: 39 mL (ref 46–106)
LV sys vol: 11 mL
Peak HR: 96 {beats}/min
Rest HR: 81 {beats}/min
SDS: 0
SRS: 0
SSS: 0
TID: 1.04

## 2021-01-29 MED ORDER — TECHNETIUM TC 99M TETROFOSMIN IV KIT
31.4000 | PACK | Freq: Once | INTRAVENOUS | Status: AC | PRN
Start: 2021-01-29 — End: 2021-01-29
  Administered 2021-01-29: 31.4 via INTRAVENOUS
  Filled 2021-01-29: qty 32

## 2021-01-29 MED ORDER — TECHNETIUM TC 99M TETROFOSMIN IV KIT
10.1000 | PACK | Freq: Once | INTRAVENOUS | Status: AC | PRN
  Administered 2021-01-29: 10.1 via INTRAVENOUS
  Filled 2021-01-29: qty 11

## 2021-01-29 MED ORDER — REGADENOSON 0.4 MG/5ML IV SOLN: 0.4000 mg | Freq: Once | INTRAVENOUS | Status: DC

## 2021-01-29 MED ORDER — ADENOSINE (DIAGNOSTIC) 3 MG/ML IV SOLN
0.5600 mg/kg | Freq: Once | INTRAVENOUS | Status: AC
  Administered 2021-01-29: 39 mg via INTRAVENOUS

## 2021-01-30 ENCOUNTER — Encounter: Payer: Self-pay | Admitting: Adult Health

## 2021-01-30 ENCOUNTER — Ambulatory Visit (INDEPENDENT_AMBULATORY_CARE_PROVIDER_SITE_OTHER): Payer: Medicare Other | Admitting: Adult Health

## 2021-01-30 ENCOUNTER — Other Ambulatory Visit: Payer: Self-pay | Admitting: Adult Health

## 2021-01-30 VITALS — BP 112/62 | HR 74 | Temp 98.0°F | Ht 63.0 in | Wt 159.2 lb

## 2021-01-30 DIAGNOSIS — E1165 Type 2 diabetes mellitus with hyperglycemia: Secondary | ICD-10-CM

## 2021-01-30 DIAGNOSIS — R569 Unspecified convulsions: Secondary | ICD-10-CM

## 2021-01-30 DIAGNOSIS — N3001 Acute cystitis with hematuria: Secondary | ICD-10-CM

## 2021-01-30 DIAGNOSIS — I10 Essential (primary) hypertension: Secondary | ICD-10-CM

## 2021-01-30 LAB — PRO B NATRIURETIC PEPTIDE: NT-Pro BNP: 219 pg/mL (ref 0–287)

## 2021-01-30 LAB — POCT GLYCOSYLATED HEMOGLOBIN (HGB A1C): Hemoglobin A1C: 8.3 % — AB (ref 4.0–5.6)

## 2021-01-30 MED ORDER — AMOXICILLIN-POT CLAVULANATE 875-125 MG PO TABS: 1.0000 | ORAL_TABLET | Freq: Two times a day (BID) | ORAL | 0 refills | Status: DC

## 2021-01-30 MED ORDER — LANTUS SOLOSTAR 100 UNIT/ML ~~LOC~~ SOPN
16.0000 [IU] | PEN_INJECTOR | Freq: Two times a day (BID) | SUBCUTANEOUS | 1 refills | Status: DC
Start: 2021-01-30 — End: 2021-12-22

## 2021-01-30 NOTE — Progress Notes (Signed)
Subjective:    Patient ID: [name], female    DOB: 1956-02-18, 65 y.o.   MRN: [id number]  HPI 65 year old female who  has a past medical history of Acute MI, inferior wall, initial episode of care [facility]) (07/08/2012), Anxiety, Asthma, Coronary artery disease, Depression, Diabetes mellitus, Fibromyalgia, Leukocytosis, Monocytosis, Peripheral arterial disease (HCC) (01/23/2013), and Tobacco use disorder (07/08/2012).  She presents to the office today for three month follow up regarding   DM-currently prescribed metformin 1000 mg twice daily, glipizide 5 mg twice daily, and Lantus 8 units in the morning and  16 units nightly. She is using the [place] system to monitor her blood sugars. Per her monitor her average glucose over last 90 days is 167, he has been in target 42% of the time and above target 48% of the time.  She has had episodes of glucose readings in the upwards of 350.  It appears as though most of her high glucose readings are in the early to mid evening.  Her last A1c in February 2022 was 8.3  HTN -currently managed with hydrochlorothiazide 12.5 mg daily and  Inderal 40 mg twice daily.  BP Readings from Last 3 Encounters:  01/30/21 112/62  12/24/20 100/72  10/30/20 (!) 152/86   Concern for UTI -she is concerned that she has a UTI.  Can symptoms include dysuria, odorous urine, discolored urine, frequency, and urgency.  History of Seizures -takes Keppra 250 mg 3 times daily.  She has not had any seizure-like activity.  When we checked her Keppra level at her CPE in February her levels were therapeutic.  Need to recheck today   Review of Systems See HPI   Past Medical History:  Diagnosis Date  . Acute MI, inferior wall, initial episode of care [facility]) 07/08/2012   Cath-07/08/11 -distal RCA stent DES (99%), LAD 20-30%. EF 50% inferior hypokinesis  (infarct)  . Anxiety   . Asthma   . Coronary artery disease   . Depression   . Diabetes mellitus   . Fibromyalgia   .  Leukocytosis   . Monocytosis   . Peripheral arterial disease (HCC) 01/23/2013  . Tobacco use disorder 07/08/2012    Social History   Socioeconomic History  . Marital status: Married    Spouse name: Not on file  . Number of children: 3  . Years of education: college  . Highest education level: Not on file  Occupational History  . Not on file  Tobacco Use  . Smoking status: Current Every Day Smoker    Packs/day: 1.00    Years: 43.00    Pack years: 43.00    Types: Cigarettes  . Smokeless tobacco: Never Used  Substance and Sexual Activity  . Alcohol use: No    Alcohol/week: 0.0 standard drinks    Comment: Rarely  . Drug use: No  . Sexual activity: Yes  Other Topics Concern  . Not on file  Social History Narrative   Lives with husband, married for 20 years.      They have three grown chlildren.   She is not currently working and is on disability   Highest level of education:  Engineer, maintenance (IT)   Pets: two dogs and three rabbits.       Husband is a long Production assistant, radio.       Is unable to enjoy any activities because of body pains.       Social Determinants of Health   Financial Resource Strain: Not on  file  Food Insecurity: Not on file  Transportation Needs: Not on file  Physical Activity: Not on file  Stress: Not on file  Social Connections: Not on file  Intimate Partner Violence: Not on file    Past Surgical History:  Procedure Laterality Date  . ABDOMINAL AORTAGRAM N/A 02/07/2013   Procedure: ABDOMINAL Ronny FlurryAORTAGRAM;  Surgeon: Iran OuchMuhammad A Arida, MD;  Location: [facility] CATH LAB;  Service: Cardiovascular;  Laterality: N/A;  . CARDIAC CATHETERIZATION    . CORONARY STENT PLACEMENT    . CYST REMOVAL TRUNK  1977   tailbone  . LEFT HEART CATHETERIZATION WITH CORONARY ANGIOGRAM N/A 07/07/2012   Procedure: LEFT HEART CATHETERIZATION WITH CORONARY ANGIOGRAM;  Surgeon: Kathleene Hazelhristopher D McAlhany, MD;  [facility];  Service: Cardiovascular;  Laterality: N/A;  .  PERCUTANEOUS CORONARY STENT INTERVENTION (PCI-S)  07/07/2012   Procedure: PERCUTANEOUS CORONARY STENT INTERVENTION (PCI-S);  Surgeon: Kathleene Hazelhristopher D McAlhany, MD;  [facility];  Service: Cardiovascular;;  . TONSILLECTOMY      Family History  Problem Relation Age of Onset  . Kidney disease Mother   . Diabetes Mother        DM Type II  . Alzheimer's disease Mother   . Diabetes Father        IDDM  . Heart disease Father   . Cancer Father        bone cancer died at 7652  . Cancer Sister        bone cancer at age 63  . Multiple sclerosis Daughter   . Diabetes Son 2       DM Type I  . Heart disease Maternal Grandfather   . Heart disease Paternal Grandmother   . Diabetes Son 3013       DM Type I    Allergies  Allergen Reactions  . Erythromycin Other (See Comments)  . Statins     SEVERE MUSCLE PAIN - failed lipitor, Livalo, red yeast rice (lovastatin), and she thinks Zocor and pravastatin  . Ace Inhibitors Cough    Patient refuses to take meds  . Cymbalta [Duloxetine Hcl]     Drowsiness    Current Outpatient Medications on File Prior to Visit  Medication Sig Dispense Refill  . amitriptyline (ELAVIL) 50 MG tablet Take 1 tablet (50 mg total) by mouth at bedtime. 90 tablet 1  . busPIRone (BUSPAR) 15 MG tablet Take 15 mg by mouth 3 (three) times daily.    . Cholecalciferol (VITAMIN D3) 2000 units TABS Take 1 tablet by mouth daily.    . cilostazol (PLETAL) 100 MG tablet Take 1/2 (one-half) tablet by mouth twice daily 90 tablet 0  . Continuous Blood Gluc Receiver (FREESTYLE LIBRE 14 DAY READER) DEVI Use with libre system 1 each 0  . Continuous Blood Gluc Sensor (FREESTYLE LIBRE 14 DAY SENSOR) MISC USE WITH FREESTYLE LIBRE READER 2 each 0  . cyclobenzaprine (FLEXERIL) 10 MG tablet Take 1 tablet (10 mg total) by mouth at bedtime. 90 tablet 0  . FLUoxetine (PROZAC) 40 MG capsule Take 1 capsule by mouth once daily 90 capsule 1  . glipiZIDE (GLUCOTROL) 5 MG tablet TAKE 1 TABLET BY  MOUTH TWICE DAILY BEFORE MEAL(S) 180 tablet 0  . hydrochlorothiazide (MICROZIDE) 12.5 MG capsule Take 1 capsule (12.5 mg total) by mouth daily. 90 capsule 0  . insulin glargine (LANTUS SOLOSTAR) 100 UNIT/ML Solostar Pen Inject 16 Units into the skin at bedtime. 15 mL 1  . Insulin Pen Needle 29G X 12.7MM MISC Use  as directed 90 each 1  . isosorbide mononitrate (IMDUR) 60 MG 24 hr tablet Take 1.5 tablets (90 mg total) by mouth daily. 135 tablet 3  . levETIRAcetam (KEPPRA) 250 MG tablet Take 1 tablet (250 mg total) by mouth in the morning, at noon, and at bedtime. 270 tablet 0  . metFORMIN (GLUCOPHAGE) 1000 MG tablet Take 1 tablet (1,000 mg total) by mouth 2 (two) times daily with a meal. 180 tablet 1  . nitroGLYCERIN (NITROSTAT) 0.4 MG SL tablet Place 1 tablet (0.4 mg total) under the tongue every 5 (five) minutes x 3 doses as needed for chest pain. 30 tablet 12  . nystatin cream (MYCOSTATIN) Apply 1 application topically 2 (two) times daily. 30 g 2  . pravastatin (PRAVACHOL) 40 MG tablet Take 1 tablet (40 mg total) by mouth at bedtime. 90 tablet 0  . pregabalin (LYRICA) 100 MG capsule Take 1 capsule by mouth twice daily 180 capsule 0  . RELION PEN NEEDLES 31G X 6 MM MISC 1 each by Other route as directed.    . propranolol (INDERAL) 40 MG tablet Take 1 tablet (40 mg total) by mouth 2 (two) times daily. 180 tablet 3   Current Facility-Administered Medications on File Prior to Visit  Medication Dose Route Frequency Provider Last Rate Last Admin  . regadenoson (LEXISCAN) injection SOLN 0.4 mg  0.4 mg Intravenous Once Jake Bathe, MD        BP 112/62 (BP Location: Left Arm, Patient Position: Sitting, Cuff Size: Normal)   Pulse 74   Temp 98 F (36.7 C) (Oral)   Ht 5\' 3"  (1.6 m)   Wt 159 lb 3.2 oz (72.2 kg)   SpO2 96%   BMI 28.20 kg/m       Objective:   Physical Exam Vitals and nursing note reviewed.  Constitutional:      Appearance: Normal appearance.  Cardiovascular:     Rate and  Rhythm: Normal rate and regular rhythm.     Pulses: Normal pulses.     Heart sounds: Normal heart sounds.  Pulmonary:     Effort: Pulmonary effort is normal.     Breath sounds: Normal breath sounds.  Abdominal:     General: Abdomen is flat. Bowel sounds are normal.     Palpations: Abdomen is soft.  Musculoskeletal:        General: Normal range of motion.  Skin:    General: Skin is warm and dry.     Capillary Refill: Capillary refill takes less than 2 seconds.  Neurological:     General: No focal deficit present.     Mental Status: She is alert and oriented to person, place, and time.  Psychiatric:        Mood and Affect: Mood normal.        Behavior: Behavior normal.        Thought Content: Thought content normal.        Judgment: Judgment normal.       Assessment & Plan:  1. Uncontrolled type 2 diabetes mellitus with hyperglycemia (HCC)  - POC HgB A1c- 8.2 - no change and not at goal  - Increase lantus to 16 units BID  - Follow up in three months  - insulin glargine (LANTUS SOLOSTAR) 100 UNIT/ML Solostar Pen; Inject 16 Units into the skin 2 (two) times daily.  Dispense: 30 mL; Refill: 1  2. Acute cystitis with hematuria - Will treat due to symptoms  - Culture, Urine - amoxicillin-clavulanate (AUGMENTIN) 875-125  MG tablet; Take 1 tablet by mouth 2 (two) times daily.  Dispense: 20 tablet; Refill: 0 - Urinalysis; Future - Urinalysis  3. Seizures (HCC)  - Levetiracetam level  4. Essential hypertension - At goal. No change in medications   Shirline Frees, NP  Shirline Frees, NP

## 2021-02-03 ENCOUNTER — Telehealth: Payer: Self-pay | Admitting: Adult Health

## 2021-02-03 LAB — URINALYSIS
Bilirubin Urine: NEGATIVE
Hgb urine dipstick: NEGATIVE
Ketones, ur: NEGATIVE
Nitrite: NEGATIVE
Protein, ur: NEGATIVE
Specific Gravity, Urine: 1.011 (ref 1.001–1.035)
pH: 7 (ref 5.0–8.0)

## 2021-02-03 LAB — LEVETIRACETAM LEVEL: Keppra (Levetiracetam): 21.6 ug/mL (ref 12.0–46.0)

## 2021-02-03 LAB — URINE CULTURE
MICRO NUMBER:: 11860030
SPECIMEN QUALITY:: ADEQUATE

## 2021-02-03 NOTE — Progress Notes (Signed)
  Chronic Care Management   Outreach Note  02/03/2021 Name: [name] MRN: [id number] DOB: 08-May-1956  Referred by: Shirline Frees, NP Reason for referral : No chief complaint on file.   An unsuccessful telephone outreach was attempted today. The patient was referred to the pharmacist for assistance with care management and care coordination.   Follow Up Plan:   Carley Perdue [employer] Scheduler

## 2021-02-06 ENCOUNTER — Other Ambulatory Visit: Payer: Self-pay | Admitting: Adult Health

## 2021-02-06 DIAGNOSIS — M797 Fibromyalgia: Secondary | ICD-10-CM

## 2021-02-12 ENCOUNTER — Other Ambulatory Visit: Payer: Self-pay | Admitting: Adult Health

## 2021-02-12 DIAGNOSIS — I10 Essential (primary) hypertension: Secondary | ICD-10-CM

## 2021-02-17 ENCOUNTER — Other Ambulatory Visit: Payer: Self-pay | Admitting: Adult Health

## 2021-02-18 ENCOUNTER — Other Ambulatory Visit: Payer: Self-pay | Admitting: Adult Health

## 2021-02-18 DIAGNOSIS — M797 Fibromyalgia: Secondary | ICD-10-CM

## 2021-02-18 DIAGNOSIS — I10 Essential (primary) hypertension: Secondary | ICD-10-CM

## 2021-02-18 MED ORDER — PREGABALIN 100 MG PO CAPS: 100.0000 mg | ORAL_CAPSULE | Freq: Two times a day (BID) | ORAL | 0 refills | Status: DC

## 2021-02-18 NOTE — Telephone Encounter (Signed)
Okay for refill?  

## 2021-02-19 ENCOUNTER — Ambulatory Visit (INDEPENDENT_AMBULATORY_CARE_PROVIDER_SITE_OTHER): Payer: Medicare Other

## 2021-02-19 DIAGNOSIS — Z0000 Encounter for general adult medical examination without abnormal findings: Secondary | ICD-10-CM | POA: Diagnosis not present

## 2021-02-19 NOTE — Patient Instructions (Signed)
Ms. [name] , Thank you for taking time to come for your Medicare Wellness Visit. I appreciate your ongoing commitment to your health goals. Please review the following plan we discussed and let me know if I can assist you in the future.   Screening recommendations/referrals: Colonoscopy: pt declines  Mammogram: pt declines  Bone Density: pt declines  Recommended yearly ophthalmology/optometry visit for glaucoma screening and checkup Recommended yearly dental visit for hygiene and checkup  Vaccinations: Influenza vaccine: pt declines   Pneumococcal vaccine:  pt declines   Tdap vaccine:  pt declines   Shingles vaccine:  pt declines    Advanced directives: will provide copies  Conditions/risks identified: difficulty controlling Blood Sugars   Next appointment: 02/27/2021  $RemoveBef'@100pm'rIVsVrpGto$  [name]  To evaluate Blood Sugars   Preventive Care 40-64 Years, Female Preventive care refers to lifestyle choices and visits with your health care provider that can promote health and wellness. What does preventive care include?  A yearly physical exam. This is also called an annual well check.  Dental exams once or twice a year.  Routine eye exams. Ask your health care provider how often you should have your eyes checked.  Personal lifestyle choices, including:  Daily care of your teeth and gums.  Regular physical activity.  Eating a healthy diet.  Avoiding tobacco and drug use.  Limiting alcohol use.  Practicing safe sex.  Taking low-dose aspirin daily starting at age 28.  Taking vitamin and mineral supplements as recommended by your health care provider. What happens during an annual well check? The services and screenings done by your health care provider during your annual well check will depend on your age, overall health, lifestyle risk factors, and family history of disease. Counseling  Your health care provider may ask you questions about your:  Alcohol  use.  Tobacco use.  Drug use.  Emotional well-being.  Home and relationship well-being.  Sexual activity.  Eating habits.  Work and work Statistician.  Method of birth control.  Menstrual cycle.  Pregnancy history. Screening  You may have the following tests or measurements:  Height, weight, and BMI.  Blood pressure.  Lipid and cholesterol levels. These may be checked every 5 years, or more frequently if you are over 76 years old.  Skin check.  Lung cancer screening. You may have this screening every year starting at age 62 if you have a 30-pack-year history of smoking and currently smoke or have quit within the past 15 years.  Fecal occult blood test (FOBT) of the stool. You may have this test every year starting at age 50.  Flexible sigmoidoscopy or colonoscopy. You may have a sigmoidoscopy every 5 years or a colonoscopy every 10 years starting at age 48.  Hepatitis C blood test.  Hepatitis B blood test.  Sexually transmitted disease (STD) testing.  Diabetes screening. This is done by checking your blood sugar (glucose) after you have not eaten for a while (fasting). You may have this done every 1-3 years.  Mammogram. This may be done every 1-2 years. Talk to your health care provider about when you should start having regular mammograms. This may depend on whether you have a family history of breast cancer.  BRCA-related cancer screening. This may be done if you have a family history of breast, ovarian, tubal, or peritoneal cancers.  Pelvic exam and Pap test. This may be done every 3 years starting at age 21. Starting at age 67, this may be done every 5 years if  you have a Pap test in combination with an HPV test.  Bone density scan. This is done to screen for osteoporosis. You may have this scan if you are at high risk for osteoporosis. Discuss your test results, treatment options, and if necessary, the need for more tests with your health care  provider. Vaccines  Your health care provider may recommend certain vaccines, such as:  Influenza vaccine. This is recommended every year.  Tetanus, diphtheria, and acellular pertussis (Tdap, Td) vaccine. You may need a Td booster every 10 years.  Zoster vaccine. You may need this after age 52.  Pneumococcal 13-valent conjugate (PCV13) vaccine. You may need this if you have certain conditions and were not previously vaccinated.  Pneumococcal polysaccharide (PPSV23) vaccine. You may need one or two doses if you smoke cigarettes or if you have certain conditions. Talk to your health care provider about which screenings and vaccines you need and how often you need them. This information is not intended to replace advice given to you by your health care provider. Make sure you discuss any questions you have with your health care provider. Document Released: 10/10/2015 Document Revised: 06/02/2016 Document Reviewed: 07/15/2015 Elsevier Interactive Patient Education  2017 [place] Prevention in the Home Falls can cause injuries. They can happen to people of all ages. There are many things you can do to make your home safe and to help prevent falls. What can I do on the outside of my home?  Regularly fix the edges of walkways and driveways and fix any cracks.  Remove anything that might make you trip as you walk through a door, such as a raised step or threshold.  Trim any bushes or trees on the path to your home.  Use bright outdoor lighting.  Clear any walking paths of anything that might make someone trip, such as rocks or tools.  Regularly check to see if handrails are loose or broken. Make sure that both sides of any steps have handrails.  Any raised decks and porches should have guardrails on the edges.  Have any leaves, snow, or ice cleared regularly.  Use sand or salt on walking paths during winter.  Clean up any spills in your garage right away. This includes  oil or grease spills. What can I do in the bathroom?  Use night lights.  Install grab bars by the toilet and in the tub and shower. Do not use towel bars as grab bars.  Use non-skid mats or decals in the tub or shower.  If you need to sit down in the shower, use a plastic, non-slip stool.  Keep the floor dry. Clean up any water that spills on the floor as soon as it happens.  Remove soap buildup in the tub or shower regularly.  Attach bath mats securely with double-sided non-slip rug tape.  Do not have throw rugs and other things on the floor that can make you trip. What can I do in the bedroom?  Use night lights.  Make sure that you have a light by your bed that is easy to reach.  Do not use any sheets or blankets that are too big for your bed. They should not hang down onto the floor.  Have a firm chair that has side arms. You can use this for support while you get dressed.  Do not have throw rugs and other things on the floor that can make you trip. What can I do in the  kitchen?  Clean up any spills right away.  Avoid walking on wet floors.  Keep items that you use a lot in easy-to-reach places.  If you need to reach something above you, use a strong step stool that has a grab bar.  Keep electrical cords out of the way.  Do not use floor polish or wax that makes floors slippery. If you must use wax, use non-skid floor wax.  Do not have throw rugs and other things on the floor that can make you trip. What can I do with my stairs?  Do not leave any items on the stairs.  Make sure that there are handrails on both sides of the stairs and use them. Fix handrails that are broken or loose. Make sure that handrails are as long as the stairways.  Check any carpeting to make sure that it is firmly attached to the stairs. Fix any carpet that is loose or worn.  Avoid having throw rugs at the top or bottom of the stairs. If you do have throw rugs, attach them to the floor  with carpet tape.  Make sure that you have a light switch at the top of the stairs and the bottom of the stairs. If you do not have them, ask someone to add them for you. What else can I do to help prevent falls?  Wear shoes that:  Do not have high heels.  Have rubber bottoms.  Are comfortable and fit you well.  Are closed at the toe. Do not wear sandals.  If you use a stepladder:  Make sure that it is fully opened. Do not climb a closed stepladder.  Make sure that both sides of the stepladder are locked into place.  Ask someone to hold it for you, if possible.  Clearly mark and make sure that you can see:  Any grab bars or handrails.  First and last steps.  Where the edge of each step is.  Use tools that help you move around (mobility aids) if they are needed. These include:  Canes.  Walkers.  Scooters.  Crutches.  Turn on the lights when you go into a dark area. Replace any light bulbs as soon as they burn out.  Set up your furniture so you have a clear path. Avoid moving your furniture around.  If any of your floors are uneven, fix them.  If there are any pets around you, be aware of where they are.  Review your medicines with your doctor. Some medicines can make you feel dizzy. This can increase your chance of falling. Ask your doctor what other things that you can do to help prevent falls. This information is not intended to replace advice given to you by your health care provider. Make sure you discuss any questions you have with your health care provider. Document Released: 07/10/2009 Document Revised: 02/19/2016 Document Reviewed: 10/18/2014 Elsevier Interactive Patient Education  2017 [employer].

## 2021-02-19 NOTE — Progress Notes (Signed)
Subjective:   [name] is a 65 y.o. female who presents for an Initial Medicare Annual Wellness Visit.  Unable to connect virtual due to computer issues. I connected with [name]  today by telephone and verified that I am speaking with the correct person using two identifiers. Location patient: home Location provider: work Persons participating in the virtual visit: patient, provider.   I discussed the limitations, risks, security and privacy concerns of performing an evaluation and management service by telephone and the availability of in person appointments. I also discussed with the patient that there may be a patient responsible charge related to this service. The patient expressed understanding and verbally consented to this telephonic visit.    Interactive audio and video telecommunications were attempted between this provider and patient, however failed, due to patient having technical difficulties OR patient did not have access to video capability.  We continued and completed visit with audio only.    Review of Systems    n/a       Objective:    There were no vitals filed for this visit. There is no height or weight on file to calculate BMI.  Advanced Directives 08/21/2018 03/12/2015 02/07/2013 07/07/2012  Does Patient Have a Medical Advance Directive? No No Patient does not have advance directive Patient would not like information  Would patient like information on creating a medical advance directive? - No - patient declined information - -  Pre-existing out of facility DNR order (yellow form or pink MOST form) - - No No    Current Medications (verified) Outpatient Encounter Medications as of 02/19/2021  Medication Sig  . amitriptyline (ELAVIL) 50 MG tablet TAKE 1 TABLET BY MOUTH AT BEDTIME  . amoxicillin-clavulanate (AUGMENTIN) 875-125 MG tablet Take 1 tablet by mouth 2 (two) times daily.  . busPIRone (BUSPAR) 15 MG tablet Take 15 mg by mouth 3  (three) times daily.  . Cholecalciferol (VITAMIN D3) 2000 units TABS Take 1 tablet by mouth daily.  . cilostazol (PLETAL) 100 MG tablet Take 1/2 (one-half) tablet by mouth twice daily  . Continuous Blood Gluc Receiver (FREESTYLE LIBRE 14 DAY READER) DEVI Use with libre system  . Continuous Blood Gluc Sensor (FREESTYLE LIBRE 14 DAY SENSOR) MISC USE WITH FREESTYLE LIBRE READER  . cyclobenzaprine (FLEXERIL) 10 MG tablet Take 1 tablet (10 mg total) by mouth at bedtime.  Marland Kitchen FLUoxetine (PROZAC) 40 MG capsule Take 1 capsule by mouth once daily  . glipiZIDE (GLUCOTROL) 5 MG tablet TAKE 1 TABLET BY MOUTH TWICE DAILY BEFORE MEAL(S)  . hydrochlorothiazide (MICROZIDE) 12.5 MG capsule Take 1 capsule by mouth once daily  . insulin glargine (LANTUS SOLOSTAR) 100 UNIT/ML Solostar Pen Inject 16 Units into the skin 2 (two) times daily.  . Insulin Pen Needle 29G X 12.7MM MISC Use as directed  . isosorbide mononitrate (IMDUR) 60 MG 24 hr tablet Take 1.5 tablets (90 mg total) by mouth daily.  Marland Kitchen levETIRAcetam (KEPPRA) 250 MG tablet Take 1 tablet (250 mg total) by mouth in the morning, at noon, and at bedtime.  . metFORMIN (GLUCOPHAGE) 1000 MG tablet Take 1 tablet (1,000 mg total) by mouth 2 (two) times daily with a meal.  . nitroGLYCERIN (NITROSTAT) 0.4 MG SL tablet Place 1 tablet (0.4 mg total) under the tongue every 5 (five) minutes x 3 doses as needed for chest pain.  Marland Kitchen nystatin cream (MYCOSTATIN) Apply 1 application topically 2 (two) times daily.  . pravastatin (PRAVACHOL) 40 MG tablet Take 1 tablet (40 mg  total) by mouth at bedtime.  . pregabalin (LYRICA) 100 MG capsule Take 1 capsule (100 mg total) by mouth 2 (two) times daily.  . propranolol (INDERAL) 40 MG tablet Take 1 tablet (40 mg total) by mouth 2 (two) times daily.  Marland Kitchen RELION PEN NEEDLES 31G X 6 MM MISC 1 each by Other route as directed.   Facility-Administered Encounter Medications as of 02/19/2021  Medication  . regadenoson (LEXISCAN) injection SOLN 0.4  mg    Allergies (verified) Erythromycin, Statins, Ace inhibitors, and Cymbalta [duloxetine hcl]   History: Past Medical History:  Diagnosis Date  . Acute MI, inferior wall, initial episode of care [facility]) 07/08/2012   Cath-07/08/11 -distal RCA stent DES (99%), LAD 20-30%. EF 50% inferior hypokinesis  (infarct)  . Anxiety   . Asthma   . Coronary artery disease   . Depression   . Diabetes mellitus   . Fibromyalgia   . Leukocytosis   . Monocytosis   . Peripheral arterial disease (HCC) 01/23/2013  . Tobacco use disorder 07/08/2012   Past Surgical History:  Procedure Laterality Date  . ABDOMINAL AORTAGRAM N/A 02/07/2013   Procedure: ABDOMINAL Ronny Flurry;  Surgeon: Iran Ouch, MD;  Location: [facility] CATH LAB;  Service: Cardiovascular;  Laterality: N/A;  . CARDIAC CATHETERIZATION    . CORONARY STENT PLACEMENT    . CYST REMOVAL TRUNK  1977   tailbone  . LEFT HEART CATHETERIZATION WITH CORONARY ANGIOGRAM N/A 07/07/2012   Procedure: LEFT HEART CATHETERIZATION WITH CORONARY ANGIOGRAM;  Surgeon: Kathleene Hazel, MD;  Location: [facility] CATH LAB;  Service: Cardiovascular;  Laterality: N/A;  . PERCUTANEOUS CORONARY STENT INTERVENTION (PCI-S)  07/07/2012   Procedure: PERCUTANEOUS CORONARY STENT INTERVENTION (PCI-S);  Surgeon: Kathleene Hazel, MD;  Location: [facility] CATH LAB;  Service: Cardiovascular;;  . TONSILLECTOMY     Family History  Problem Relation Age of Onset  . Kidney disease Mother   . Diabetes Mother        DM Type II  . Alzheimer's disease Mother   . Diabetes Father        IDDM  . Heart disease Father   . Cancer Father        bone cancer died at 77  . Cancer Sister        bone cancer at age 15  . Multiple sclerosis Daughter   . Diabetes Son 2       DM Type I  . Heart disease Maternal Grandfather   . Heart disease Paternal Grandmother   . Diabetes Son 61       DM Type I   Social History   Socioeconomic History  . Marital status: Married    Spouse name: Not on  file  . Number of children: 3  . Years of education: college  . Highest education level: Not on file  Occupational History  . Not on file  Tobacco Use  . Smoking status: Current Every Day Smoker    Packs/day: 1.00    Years: 43.00    Pack years: 43.00    Types: Cigarettes  . Smokeless tobacco: Never Used  Substance and Sexual Activity  . Alcohol use: No    Alcohol/week: 0.0 standard drinks    Comment: Rarely  . Drug use: No  . Sexual activity: Yes  Other Topics Concern  . Not on file  Social History Narrative   Lives with husband, married for 20 years.      They have three grown chlildren.   She is not  currently working and is on disability   Highest level of education:  College gEngineer, maintenance (IT)s: two dogs and three rabbits.       Husband is a long Production assistant, radio.       Is unable to enjoy any activities because of body pains.       Social Determinants of Health   Financial Resource Strain: Not on file  Food Insecurity: Not on file  Transportation Needs: Not on file  Physical Activity: Not on file  Stress: Not on file  Social Connections: Not on file    Tobacco Counseling Ready to quit: Not Answered Counseling given: Not Answered   Clinical Intake:                 Diabetic?yes Nutrition Risk Assessment:  Has the patient had any N/V/D within the last 2 months?  No  Does the patient have any non-healing wounds?  No  Has the patient had any unintentional weight loss or weight gain?  No   Diabetes:  Is the patient diabetic?  Yes  If diabetic, was a CBG obtained today?  No  Did the patient bring in their glucometer from home?  No  How often do you monitor your CBG's? 3 x days   Financial Strains and Diabetes Management:  Are you having any financial strains with the device, your supplies or your medication? No .  Does the patient want to be seen by Chronic Care Management for management of their diabetes?  No  Would the patient like to be  referred to a Nutritionist or for Diabetic Management?  No   Diabetic Exams:  Diabetic Eye Exam: Completed Dr.Madison  Diabetic Foot Exam: Overdue, Pt has been advised about the importance in completing this exam. Pt is scheduled for diabetic foot exam on next office Visit .         Activities of Daily Living No flowsheet data found.  Patient Care Team: Shirline Frees, NP as PCP - General (Family Medicine) Rollene Rotunda, MD as PCP - Cardiology (Cardiology) Exie Parody, MD (Hematology and Oncology) [place], Hanley Seamen as Referring Physician (Physician Assistant) York Spaniel, MD as Consulting Physician (Neurology)  Indicate any recent Medical Services you may have received from other than [facility] providers in the past year (date may be approximate).     Assessment:   This is a routine wellness examination for [name].  Hearing/Vision screen No exam data present  Dietary issues and exercise activities discussed:    Goals Addressed   None    Depression Screen PHQ 2/9 Scores 07/17/2015  PHQ - 2 Score 6  PHQ- 9 Score 27    Fall Risk Fall Risk  10/30/2020  Falls in the past year? 1  Number falls in past yr: 1  Injury with Fall? 0    FALL RISK PREVENTION PERTAINING TO THE HOME:  Any stairs in or around the home? No  If so, are there any without handrails? Yes  Home free of loose throw rugs in walkways, pet beds, electrical cords, etc? Yes  Adequate lighting in your home to reduce risk of falls? Yes   ASSISTIVE DEVICES UTILIZED TO PREVENT FALLS:  Life alert? No  Use of a cane, walker or w/c? Yes  Grab bars in the bathroom? Yes  Shower chair or bench in shower? Yes  Elevated toilet seat or a handicapped toilet? Yes    Cognitive Function:     Normal cognitive status assessed by direct  observation by this Nurse Health Advisor. No abnormalities found.      Immunizations  There is no immunization history on file for this patient.  TDAP status: Due,  Education has been provided regarding the importance of this vaccine. Advised may receive this vaccine at local pharmacy or Health Dept. Aware to provide a copy of the vaccination record if obtained from local pharmacy or Health Dept. Verbalized acceptance and understanding.  Flu Vaccine status: Due, Education has been provided regarding the importance of this vaccine. Advised may receive this vaccine at local pharmacy or Health Dept. Aware to provide a copy of the vaccination record if obtained from local pharmacy or Health Dept. Verbalized acceptance and understanding.  Pneumococcal vaccine status: Due, Education has been provided regarding the importance of this vaccine. Advised may receive this vaccine at local pharmacy or Health Dept. Aware to provide a copy of the vaccination record if obtained from local pharmacy or Health Dept. Verbalized acceptance and understanding.  Covid-19 vaccine status: Declined, Education has been provided regarding the importance of this vaccine but patient still declined. Advised may receive this vaccine at local pharmacy or Health Dept.or vaccine clinic. Aware to provide a copy of the vaccination record if obtained from local pharmacy or Health Dept. Verbalized acceptance and understanding.  Qualifies for Shingles Vaccine? Yes   Zostavax completed No   Shingrix Completed?: No.    Education has been provided regarding the importance of this vaccine. Patient has been advised to call insurance company to determine out of pocket expense if they have not yet received this vaccine. Advised may also receive vaccine at local pharmacy or Health Dept. Verbalized acceptance and understanding.  Screening Tests Health Maintenance  Topic Date Due  . COVID-19 Vaccine (1) Never done  . Hepatitis C Screening  Never done  . PAP SMEAR-Modifier  Never done  . Zoster Vaccines- Shingrix (1 of 2) Never done  . URINE MICROALBUMIN  04/05/2014  . MAMMOGRAM  04/15/2021 (Originally  02/26/2008)  . COLONOSCOPY (Pts 45-334yrs Insurance coverage will need to be confirmed)  04/15/2021 (Originally 02/26/2003)  . PNEUMOCOCCAL POLYSACCHARIDE VACCINE AGE 55-64 HIGH RISK  04/15/2021 (Originally 02/26/1960)  . TETANUS/TDAP  04/15/2021 (Originally 02/25/1977)  . INFLUENZA VACCINE  09/05/2035 (Originally 04/27/2021)  . FOOT EXAM  04/11/2021  . HEMOGLOBIN A1C  08/02/2021  . OPHTHALMOLOGY EXAM  12/31/2021  . HIV Screening  Completed  . HPV VACCINES  Aged Out    Health Maintenance  Health Maintenance Due  Topic Date Due  . COVID-19 Vaccine (1) Never done  . Hepatitis C Screening  Never done  . PAP SMEAR-Modifier  Never done  . Zoster Vaccines- Shingrix (1 of 2) Never done  . URINE MICROALBUMIN  04/05/2014    Colorectal cancer screening: Referral to GI placed pt declines . Pt aware the office will call re: appt.  Mammogram status: Ordered pt declines . Pt provided with contact info and advised to call to schedule appt.   Bone Density status: Completed pt declines . Results reflect: Bone density results: NORMAL. Repeat every pt declines  years.  Lung Cancer Screening: (Low Dose CT Chest recommended if Age 54-80 years, 30 pack-year currently smoking OR have quit w/in 15years.) does not qualify.   Lung Cancer Screening Referral: n/a  Additional Screening:  Hepatitis C Screening: does not qualify  Vision Screening: Recommended annual ophthalmology exams for early detection of glaucoma and other disorders of the eye. Is the patient up to date with their annual eye exam?  Yes  Who is the provider or what is the name of the office in which the patient attends annual eye exams? Dr.Madison  If pt is not established with a provider, would they like to be referred to a provider to establish care? No .   Dental Screening: Recommended annual dental exams for proper oral hygiene  Community Resource Referral / Chronic Care Management: CRR required this visit?  No   CCM required this visit?   No      Plan:     I have personally reviewed and noted the following in the patient's chart:   . Medical and social history . Use of alcohol, tobacco or illicit drugs  . Current medications and supplements including opioid prescriptions. Patient is not currently taking opioid prescriptions. . Functional ability and status . Nutritional status . Physical activity . Advanced directives . List of other physicians . Hospitalizations, surgeries, and ER visits in previous 12 months . Vitals . Screenings to include cognitive, depression, and falls . Referrals and appointments  In addition, I have reviewed and discussed with patient certain preventive protocols, quality metrics, and best practice recommendations. A written personalized care plan for preventive services as well as general preventive health recommendations were provided to patient.     March Rummage, LPN   8/50/[id number]   Nurse Notes:  Diabetic BS control

## 2021-02-20 ENCOUNTER — Other Ambulatory Visit: Payer: Self-pay | Admitting: Adult Health

## 2021-02-20 DIAGNOSIS — M797 Fibromyalgia: Secondary | ICD-10-CM

## 2021-02-20 NOTE — Telephone Encounter (Signed)
Okay for refill?  

## 2021-02-26 NOTE — Telephone Encounter (Signed)
Noted  

## 2021-02-27 ENCOUNTER — Ambulatory Visit: Payer: Medicare Other | Admitting: Adult Health

## 2021-02-27 DIAGNOSIS — Z0289 Encounter for other administrative examinations: Secondary | ICD-10-CM

## 2021-02-27 NOTE — Progress Notes (Deleted)
Subjective:    Patient ID: [name], female    DOB: 1956/03/27, 65 y.o.   MRN: [id number]  HPI 65 year old female who  has a past medical history of Acute MI, inferior wall, initial episode of care [facility]) (07/08/2012), Anxiety, Asthma, Coronary artery disease, Depression, Diabetes mellitus, Fibromyalgia, Leukocytosis, Monocytosis, Peripheral arterial disease (HCC) (01/23/2013), and Tobacco use disorder (07/08/2012).  Following up today for elevated blood sugar readings.  Currently prescribed metformin 1000 mg twice daily, glipizide 5 mg twice daily, and Lantus 16 units in the morning and evening.  During her last visit roughly a month ago her A1c was 8.3 and at that time we increased Lantus from 8 units in the morning to 16 units and kept her evening dose at 16 units.   Review of Systems See HPI   Past Medical History:  Diagnosis Date  . Acute MI, inferior wall, initial episode of care [facility]) 07/08/2012   Cath-07/08/11 -distal RCA stent DES (99%), LAD 20-30%. EF 50% inferior hypokinesis  (infarct)  . Anxiety   . Asthma   . Coronary artery disease   . Depression   . Diabetes mellitus   . Fibromyalgia   . Leukocytosis   . Monocytosis   . Peripheral arterial disease (HCC) 01/23/2013  . Tobacco use disorder 07/08/2012    Social History   Socioeconomic History  . Marital status: Married    Spouse name: Not on file  . Number of children: 3  . Years of education: college  . Highest education level: Not on file  Occupational History  . Not on file  Tobacco Use  . Smoking status: Current Every Day Smoker    Packs/day: 1.00    Years: 43.00    Pack years: 43.00    Types: Cigarettes  . Smokeless tobacco: Never Used  Substance and Sexual Activity  . Alcohol use: No    Alcohol/week: 0.0 standard drinks    Comment: Rarely  . Drug use: No  . Sexual activity: Yes  Other Topics Concern  . Not on file  Social History Narrative   Lives with husband, married for 20 years.       They have three grown chlildren.   She is not currently working and is on disability   Highest level of education:  Engineer, maintenance (IT)   Pets: two dogs and three rabbits.       Husband is a long Production assistant, radio.       Is unable to enjoy any activities because of body pains.       Social Determinants of Health   Financial Resource Strain: Low Risk   . Difficulty of Paying Living Expenses: Not hard at all  Food Insecurity: No Food Insecurity  . Worried About Programme researcher, broadcasting/film/video in the Last Year: Never true  . Ran Out of Food in the Last Year: Never true  Transportation Needs: No Transportation Needs  . Lack of Transportation (Medical): No  . Lack of Transportation (Non-Medical): No  Physical Activity: Inactive  . Days of Exercise per Week: 0 days  . Minutes of Exercise per Session: 0 min  Stress: Not on file  Social Connections: Moderately Isolated  . Frequency of Communication with Friends and Family: Twice a week  . Frequency of Social Gatherings with Friends and Family: Twice a week  . Attends Religious Services: Never  . Active Member of Clubs or Organizations: No  . Attends Banker Meetings: Never  . Marital Status:  Married  Intimate Partner Violence: Not At Risk  . Fear of Current or Ex-Partner: No  . Emotionally Abused: No  . Physically Abused: No  . Sexually Abused: No    Past Surgical History:  Procedure Laterality Date  . ABDOMINAL AORTAGRAM N/A 02/07/2013   Procedure: ABDOMINAL Ronny Flurry;  Surgeon: Iran Ouch, MD;  Location: [facility] CATH LAB;  Service: Cardiovascular;  Laterality: N/A;  . CARDIAC CATHETERIZATION    . CORONARY STENT PLACEMENT    . CYST REMOVAL TRUNK  1977   tailbone  . LEFT HEART CATHETERIZATION WITH CORONARY ANGIOGRAM N/A 07/07/2012   Procedure: LEFT HEART CATHETERIZATION WITH CORONARY ANGIOGRAM;  Surgeon: Kathleene Hazel, MD;  [facility] CATH LAB;  Service: Cardiovascular;  Laterality: N/A;  . PERCUTANEOUS CORONARY STENT  INTERVENTION (PCI-S)  07/07/2012   Procedure: PERCUTANEOUS CORONARY STENT INTERVENTION (PCI-S);  Surgeon: Kathleene Hazel, MD;  Location: [facility] CATH LAB;  Service: Cardiovascular;;  . TONSILLECTOMY      Family History  Problem Relation Age of Onset  . Kidney disease Mother   . Diabetes Mother        DM Type II  . Alzheimer's disease Mother   . Diabetes Father        IDDM  . Heart disease Father   . Cancer Father        bone cancer died at 23  . Cancer Sister        bone cancer at age 5  . Multiple sclerosis Daughter   . Diabetes Son 2       DM Type I  . Heart disease Maternal Grandfather   . Heart disease Paternal Grandmother   . Diabetes Son 61       DM Type I    Allergies  Allergen Reactions  . Erythromycin Other (See Comments)  . Statins     SEVERE MUSCLE PAIN - failed lipitor, Livalo, red yeast rice (lovastatin), and she thinks Zocor and pravastatin  . Ace Inhibitors Cough    Patient refuses to take meds  . Cymbalta [Duloxetine Hcl]     Drowsiness    Current Outpatient Medications on File Prior to Visit  Medication Sig Dispense Refill  . amitriptyline (ELAVIL) 50 MG tablet TAKE 1 TABLET BY MOUTH AT BEDTIME 90 tablet 2  . amoxicillin-clavulanate (AUGMENTIN) 875-125 MG tablet Take 1 tablet by mouth 2 (two) times daily. 20 tablet 0  . busPIRone (BUSPAR) 15 MG tablet Take 15 mg by mouth 3 (three) times daily.    . Cholecalciferol (VITAMIN D3) 2000 units TABS Take 1 tablet by mouth daily.    . cilostazol (PLETAL) 100 MG tablet Take 1/2 (one-half) tablet by mouth twice daily 90 tablet 0  . Continuous Blood Gluc Receiver (FREESTYLE LIBRE 14 DAY READER) DEVI Use with libre system 1 each 0  . Continuous Blood Gluc Sensor (FREESTYLE LIBRE 14 DAY SENSOR) MISC USE WITH FREESTYLE LIBRE READER 2 each 0  . cyclobenzaprine (FLEXERIL) 10 MG tablet Take 1 tablet (10 mg total) by mouth at bedtime. 90 tablet 0  . FLUoxetine (PROZAC) 40 MG capsule Take 1 capsule by mouth once  daily 90 capsule 1  . glipiZIDE (GLUCOTROL) 5 MG tablet TAKE 1 TABLET BY MOUTH TWICE DAILY BEFORE MEAL(S) 180 tablet 0  . hydrochlorothiazide (MICROZIDE) 12.5 MG capsule Take 1 capsule by mouth once daily 90 capsule 0  . insulin glargine (LANTUS SOLOSTAR) 100 UNIT/ML Solostar Pen Inject 16 Units into the skin 2 (two) times daily. 30 mL 1  .  Insulin Pen Needle 29G X 12.7MM MISC Use as directed 90 each 1  . isosorbide mononitrate (IMDUR) 60 MG 24 hr tablet Take 1.5 tablets (90 mg total) by mouth daily. 135 tablet 3  . levETIRAcetam (KEPPRA) 250 MG tablet Take 1 tablet (250 mg total) by mouth in the morning, at noon, and at bedtime. 270 tablet 0  . metFORMIN (GLUCOPHAGE) 1000 MG tablet Take 1 tablet (1,000 mg total) by mouth 2 (two) times daily with a meal. 180 tablet 1  . nitroGLYCERIN (NITROSTAT) 0.4 MG SL tablet Place 1 tablet (0.4 mg total) under the tongue every 5 (five) minutes x 3 doses as needed for chest pain. 30 tablet 12  . nystatin cream (MYCOSTATIN) Apply 1 application topically 2 (two) times daily. 30 g 2  . pravastatin (PRAVACHOL) 40 MG tablet Take 1 tablet (40 mg total) by mouth at bedtime. 90 tablet 0  . pregabalin (LYRICA) 100 MG capsule Take 1 capsule (100 mg total) by mouth 2 (two) times daily. 180 capsule 0  . propranolol (INDERAL) 40 MG tablet Take 1 tablet (40 mg total) by mouth 2 (two) times daily. 180 tablet 3  . RELION PEN NEEDLES 31G X 6 MM MISC 1 each by Other route as directed.     Current Facility-Administered Medications on File Prior to Visit  Medication Dose Route Frequency Provider Last Rate Last Admin  . regadenoson (LEXISCAN) injection SOLN 0.4 mg  0.4 mg Intravenous Once Jake Bathe, MD        There were no vitals taken for this visit.      Objective:   Physical Exam        Assessment & Plan:

## 2021-03-08 ENCOUNTER — Other Ambulatory Visit: Payer: Self-pay | Admitting: Adult Health

## 2021-03-08 DIAGNOSIS — R569 Unspecified convulsions: Secondary | ICD-10-CM

## 2021-03-09 MED ORDER — LEVETIRACETAM 250 MG PO TABS: 250.0000 mg | ORAL_TABLET | Freq: Three times a day (TID) | ORAL | 0 refills | Status: DC

## 2021-03-09 MED ORDER — CYCLOBENZAPRINE HCL 10 MG PO TABS: 10.0000 mg | ORAL_TABLET | Freq: Every day | ORAL | 0 refills | Status: DC

## 2021-03-25 ENCOUNTER — Telehealth: Payer: Self-pay | Admitting: Adult Health

## 2021-03-25 NOTE — Chronic Care Management (AMB) (Signed)
  Chronic Care Management   Outreach Note  03/25/2021 Name: Biviana Saddler MRN: [id number] DOB: 28-Nov-1955  Referred by: Shirline Frees, NP Reason for referral : No chief complaint on file.   A second unsuccessful telephone outreach was attempted today. The patient was referred to pharmacist for assistance with care management and care coordination.  Follow Up Plan:   Tatjana Dellinger Upstream Scheduler

## 2021-04-14 ENCOUNTER — Telehealth: Payer: Self-pay | Admitting: Adult Health

## 2021-04-14 NOTE — Chronic Care Management (AMB) (Signed)
  Chronic Care Management   Outreach Note  04/14/2021 Name: [name] MRN: [id number] DOB: 07/20/1956  Referred by: Shirline Frees, NP Reason for referral : No chief complaint on file.   Third unsuccessful telephone outreach was attempted today. The patient was referred to the pharmacist for assistance with care management and care coordination.   Follow Up Plan:   Tatjana Dellinger Upstream Scheduler

## 2021-04-14 NOTE — Chronic Care Management (AMB) (Signed)
  Chronic Care Management   Note  04/14/2021 Name: [name] MRN: [id number] DOB: [birth year]  [name] is a 65 y.o. year old female who is a primary care patient of Shirline Frees, NP. I reached out to [facility] by phone today in response to a referral sent by Ms. Charliene Adrian-Munro's PCP, Shirline Frees, NP.   [name] was given information about Chronic Care Management services today including:  CCM service includes personalized support from designated clinical staff supervised by her physician, including individualized plan of care and coordination with other care providers 24/7 contact phone numbers for assistance for urgent and routine care needs. Service will only be billed when office clinical staff spend 20 minutes or more in a month to coordinate care. Only one practitioner may furnish and bill the service in a calendar month. The patient may stop CCM services at any time (effective at the end of the month) by phone call to the office staff.   Patient agreed to services and verbal consent obtained.   Follow up plan:   [employer] manager, fast food

## 2021-05-29 ENCOUNTER — Other Ambulatory Visit: Payer: Self-pay | Admitting: Adult Health

## 2021-05-29 DIAGNOSIS — I10 Essential (primary) hypertension: Secondary | ICD-10-CM

## 2021-05-29 DIAGNOSIS — E1165 Type 2 diabetes mellitus with hyperglycemia: Secondary | ICD-10-CM

## 2021-06-10 ENCOUNTER — Telehealth: Payer: Self-pay | Admitting: Pharmacist

## 2021-06-10 NOTE — Chronic Care Management (AMB) (Signed)
Chronic Care Management Pharmacy Assistant   Name: Inger Wiest  MRN: [id number] DOB: 09-May-1956  [name] is an 65 y.o. year old female who presents for her initial CCM visit with the clinical pharmacist.  Reason for Encounter: Chart prep for initial visit with Gaylord Shih the Clinical Pharmacist on 06/15/2021 at 2:00   Conditions to be addressed/monitored: CAD, HLD, DMII, and Depression and Fibromyalgia   Recent office visits:  01/30/2021 Shirline Frees NP (PCP) - Patient was seen for Uncontrolled type 2 diabetes and 3 other issues. Increased dose of Insulin Glargine 16 units daily to 16 units two times daily. Started on Augmentin 875/125mg  1 tablet twice daily.  No follow up noted.  Recent consult visits:  12/24/2020 Rollene Rotunda MD (Cardiology) - Patient was seen for Coronary artery disease and 3 other issues.  Discontinued Fluticasone. Referred to Pulmonology. Follow up in 12 months.  Hospital visits:  None in previous 6 months  Medications: Outpatient Encounter Medications as of 06/10/2021  Medication Sig   amitriptyline (ELAVIL) 50 MG tablet TAKE 1 TABLET BY MOUTH AT BEDTIME   amoxicillin-clavulanate (AUGMENTIN) 875-125 MG tablet Take 1 tablet by mouth 2 (two) times daily.   busPIRone (BUSPAR) 15 MG tablet Take 15 mg by mouth 3 (three) times daily.   Cholecalciferol (VITAMIN D3) 2000 units TABS Take 1 tablet by mouth daily.   cilostazol (PLETAL) 100 MG tablet Take 1/2 (one-half) tablet by mouth twice daily   Continuous Blood Gluc Receiver (FREESTYLE LIBRE 14 DAY READER) DEVI Use with libre system   Continuous Blood Gluc Sensor (FREESTYLE LIBRE 14 DAY SENSOR) MISC USE AS DIRECTED EVERY 14 DAYS   cyclobenzaprine (FLEXERIL) 10 MG tablet Take 1 tablet (10 mg total) by mouth at bedtime.   FLUoxetine (PROZAC) 40 MG capsule Take 1 capsule by mouth once daily   glipiZIDE (GLUCOTROL) 5 MG tablet TAKE 1 TABLET BY MOUTH TWICE DAILY BEFORE MEAL(S)   hydrochlorothiazide  (MICROZIDE) 12.5 MG capsule Take 1 capsule by mouth once daily   insulin glargine (LANTUS SOLOSTAR) 100 UNIT/ML Solostar Pen Inject 16 Units into the skin 2 (two) times daily.   Insulin Pen Needle 29G X 12.7MM MISC Use as directed   isosorbide mononitrate (IMDUR) 60 MG 24 hr tablet Take 1.5 tablets (90 mg total) by mouth daily.   levETIRAcetam (KEPPRA) 250 MG tablet Take 1 tablet (250 mg total) by mouth in the morning, at noon, and at bedtime.   metFORMIN (GLUCOPHAGE) 1000 MG tablet TAKE 1 TABLET BY MOUTH TWICE DAILY WITH MEALS   nitroGLYCERIN (NITROSTAT) 0.4 MG SL tablet Place 1 tablet (0.4 mg total) under the tongue every 5 (five) minutes x 3 doses as needed for chest pain.   nystatin cream (MYCOSTATIN) Apply 1 application topically 2 (two) times daily.   pravastatin (PRAVACHOL) 40 MG tablet TAKE 1 TABLET BY MOUTH AT BEDTIME   pregabalin (LYRICA) 100 MG capsule Take 1 capsule (100 mg total) by mouth 2 (two) times daily.   propranolol (INDERAL) 40 MG tablet Take 1 tablet (40 mg total) by mouth 2 (two) times daily.   RELION PEN NEEDLES 31G X 6 MM MISC 1 each by Other route as directed.   Facility-Administered Encounter Medications as of 06/10/2021  Medication   regadenoson (LEXISCAN) injection SOLN 0.4 mg   Fill History:  AMITRIPTYLIN 50MG    TAB 05/15/2021 90   CILOSTAZOL 100MG     TAB 05/29/2021 90   FLUoxetine 40MG      CAP 05/29/2021 90   LANTUS  SOLOSTAR     INJ 05/03/2021 94   ISOSORB MONO ER 60MG  TAB 03/01/2021 90   NITROGLYCER 0.4MG    SUB 09/09/2020 30   PREGABALIN 100MG  CAP 02/20/2021 90   PROPRANOLOL 40MG     TAB 04/09/2021 90   BusPIRone 15MG       TAB 11/12/2020 90   GlipiZIDE 5MG        TAB 03/23/2021 90   HYDROCHLOROTHIAZIDE 12.5MG  CAP 05/29/2021 90   LevETIRAcetam 250MG  TAB 05/29/2021 90   METFORMIN 1000MG  TAB 05/29/2021 90     Care Gaps:  AWV - completed 02/19/2021 Covid 19 vaccine - never done Pneumovax - never done Hep C screen - never done Tetanus/TDAP  vaccine - never done Zoster vaccine - never done Pap smear - never done Colonoscopy - never done Mammogram - never done Microalbumin urine - overdue 04/05/2014 Foot exam - overdue 0716/2022  Star Rating Drugs:  Glipizide 5mg  - last filled 03/23/2021 90DS at [facility] spoke with Sabrina, patient has a refill awaiting pick up. Pravastatin 40mg  - last filled 03/28/2021 90DS at [facility]  Attempted to contact patient 3 times, unable to reach patient.  07/29/2021 [facility]  Health Concierge [phone]

## 2021-06-15 ENCOUNTER — Ambulatory Visit: Payer: Medicare Other

## 2021-06-15 NOTE — Progress Notes (Deleted)
Chronic Care Management Pharmacy Note  06/15/2021 Name:  [name] MRN:  [id number] DOB:  1956/07/28  Summary: ***  Recommendations/Changes made from today's visit: ***  Plan: ***   Subjective: [name] is an 65 y.o. year old female who is a primary patient of [name], NP.  The CCM team was consulted for assistance with disease management and care coordination needs.    Engaged with patient face to face for initial visit in response to provider referral for pharmacy case management and/or care coordination services.   Consent to Services:  The patient was given the following information about Chronic Care Management services today, agreed to services, and gave verbal consent: 1. CCM service includes personalized support from designated clinical staff supervised by the primary care provider, including individualized plan of care and coordination with other care providers 2. 24/7 contact phone numbers for assistance for urgent and routine care needs. 3. Service will only be billed when office clinical staff spend 20 minutes or more in a month to coordinate care. 4. Only one practitioner may furnish and bill the service in a calendar month. 5.The patient may stop CCM services at any time (effective at the end of the month) by phone call to the office staff. 6. The patient will be responsible for cost sharing (co-pay) of up to 20% of the service fee (after annual deductible is met). Patient agreed to services and consent obtained.  Patient Care Team: [name], NP as PCP - General (Family Medicine) Minus Breeding, MD as PCP - Cardiology (Cardiology) Nobie Putnam, MD (Hematology and Oncology) Al[place]ar, Peggyann Juba as Referring Physician (Physician Assistant) Kathrynn Ducking, MD as Consulting Physician (Neurology) Viona Gilmore, [facility] as Pharmacist (Pharmacist)  Recent office visits: 02/19/21 Randel Pigg, LPN: Patient presented for AWV.  01/30/2021 [name] NP - Patient was seen for Uncontrolled type 2 diabetes and 3 other issues. Increased dose of Insulin Glargine 16 units daily to 16 units two times daily. Started on Augmentin 875/166m 1 tablet twice daily.  Prescribed Wixela.  Recent consult visits: 12/24/2020 JMinus BreedingMD (Cardiology) - Patient was seen for Coronary artery disease and 3 other issues.  Discontinued Fluticasone. Referred to Pulmonology. Follow up in 12 months.  Hospital visits: None in previous 6 months   Objective:  Lab Results  Component Value Date   CREATININE 0.89 10/30/2020   BUN 14 10/30/2020   GFR 69.36 10/30/2020   GFRNONAA 54 (L) 08/21/2018   GFRAA >60 08/21/2018   NA 140 10/30/2020   K 4.6 10/30/2020   CALCIUM 10.8 (H) 10/30/2020   CO2 24 10/30/2020   GLUCOSE 101 (H) 10/30/2020    Lab Results  Component Value Date/Time   HGBA1C 8.3 (A) 01/30/2021 02:40 PM   HGBA1C 8.3 (H) 10/30/2020 01:38 PM   HGBA1C 7.6 (A) 07/30/2020 11:02 AM   HGBA1C 8.5 (H) 04/08/2020 01:58 PM   GFR 69.36 10/30/2020 01:38 PM   GFR 71.62 04/08/2020 01:58 PM   MICROALBUR 1.4 04/05/2013 02:05 PM    Last diabetic Eye exam:  Lab Results  Component Value Date/Time   HMDIABEYEEXA no DR 05/11/2013 12:00 AM    Last diabetic Foot exam: No results found for: HMDIABFOOTEX   Lab Results  Component Value Date   CHOL 146 10/30/2020   HDL 44.30 10/30/2020   LDLCALC 69 10/30/2020   LDLDIRECT 131.1 05/22/2014   TRIG 167.0 (H) 10/30/2020   CHOLHDL 3 10/30/2020    Hepatic Function Latest Ref Rng &  Units 10/30/2020 04/08/2020 03/12/2015  Total Protein 6.0 - 8.3 g/dL 6.6 6.6 7.5  Albumin 3.5 - 5.2 g/dL 3.8 4.0 4.1  AST 0 - 37 U/L 15 10 19   ALT 0 - 35 U/L 15 8 23   Alk Phosphatase 39 - 117 U/L 88 51 97  Total Bilirubin 0.2 - 1.2 mg/dL 0.2 0.2 0.26  Bilirubin, Direct 0.0 - 0.3 mg/dL - - -    Lab Results  Component Value Date/Time   TSH 2.33 10/30/2020 01:38 PM   TSH 1.43 04/08/2020 01:58 PM    CBC Latest Ref Rng &  Units 10/30/2020 04/08/2020 08/21/2018  WBC 4.0 - 10.5 K/uL 15.4(H) 12.1(H) 25.9(H)  Hemoglobin 12.0 - 15.0 g/dL 14.1 13.7 18.4(H)  Hematocrit 36.0 - 46.0 % 42.4 41.5 55.6(H)  Platelets 150.0 - 400.0 K/uL 319.0 318.0 333    No results found for: VD25OH  Clinical ASCVD: Yes  The ASCVD Risk score (Arnett DK, et al., 2019) failed to calculate for the following reasons:   The patient has a prior MI or stroke diagnosis    Depression screen [facility] 2/9 02/19/2021 07/17/2015  Decreased Interest 0 3  Down, Depressed, Hopeless 0 3  PHQ - 2 Score 0 6  Altered sleeping - 3  Tired, decreased energy - 3  Change in appetite - 3  Feeling bad or failure about yourself  - 3  Trouble concentrating - 3  Moving slowly or fidgety/restless - 3  Suicidal thoughts - 3  PHQ-9 Score - 27     ***Other: (CHADS2VASc if Afib, MMRC or CAT for COPD, ACT, DEXA)  Social History   Tobacco Use  Smoking Status Every Day   Packs/day: 1.00   Years: 43.00   Pack years: 43.00   Types: Cigarettes  Smokeless Tobacco Never   BP Readings from Last 3 Encounters:  01/30/21 112/62  12/24/20 100/72  10/30/20 (!) 152/86   Pulse Readings from Last 3 Encounters:  01/30/21 74  12/24/20 75  08/04/20 73   Wt Readings from Last 3 Encounters:  01/30/21 159 lb 3.2 oz (72.2 kg)  01/29/21 153 lb (69.4 kg)  12/24/20 153 lb (69.4 kg)   BMI Readings from Last 3 Encounters:  01/30/21 28.20 kg/m  01/29/21 27.10 kg/m  12/24/20 27.10 kg/m    Assessment/Interventions: Review of patient past medical history, allergies, medications, health status, including review of consultants reports, laboratory and other test data, was performed as part of comprehensive evaluation and provision of chronic care management services.   SDOH:  (Social Determinants of Health) assessments and interventions performed: {yes/no:20286}  SDOH Screenings   Alcohol Screen: Low Risk    Last Alcohol Screening Score (AUDIT): 0  Depression (PHQ2-9):  Low Risk    PHQ-2 Score: 0  Financial Resource Strain: Low Risk    Difficulty of Paying Living Expenses: Not hard at all  Food Insecurity: No Food Insecurity   Worried About Charity fundraiser in the Last Year: Never true   Ran Out of Food in the Last Year: Never true  Housing: Low Risk    Last Housing Risk Score: 0  Physical Activity: Inactive   Days of Exercise per Week: 0 days   Minutes of Exercise per Session: 0 min  Social Connections: Moderately Isolated   Frequency of Communication with Friends and Family: Twice a week   Frequency of Social Gatherings with Friends and Family: Twice a week   Attends Religious Services: Never   Marine scientist or Organizations: No  Attends Archivist Meetings: Never   Marital Status: Married  Stress: Not on file  Tobacco Use: High Risk   Smoking Tobacco Use: Every Day   Smokeless Tobacco Use: Never  Transportation Needs: No Transportation Needs   Lack of Transportation (Medical): No   Lack of Transportation (Non-Medical): No    CCM Care Plan  Allergies  Allergen Reactions   Erythromycin Other (See Comments)   Statins     SEVERE MUSCLE PAIN - failed lipitor, Livalo, red yeast rice (lovastatin), and she thinks Zocor and pravastatin   Ace Inhibitors Cough    Patient refuses to take meds   Cymbalta [Duloxetine Hcl]     Drowsiness    Medications Reviewed Today     Reviewed by Randel Pigg, LPN (Licensed Practical Nurse) on 02/19/21 at [place]  Med List Status: <None>   Medication Order Taking? Sig Documenting Provider Last Dose Status Informant  amitriptyline (ELAVIL) 50 MG tablet [id number] Yes TAKE 1 TABLET BY MOUTH AT BEDTIME [name], Tommi Rumps, NP Taking Active   amoxicillin-clavulanate (AUGMENTIN) 875-125 MG tablet [id number]  Take 1 tablet by mouth 2 (two) times daily. [name], Tommi Rumps, NP  Consider Medication Status and Discontinue (Completed Course)   busPIRone (BUSPAR) 15 MG tablet [id number] Yes Take 15 mg by mouth 3  (three) times daily. [provider] Taking Active   Cholecalciferol (VITAMIN D3) 2000 units TABS [id number] Yes Take 1 tablet by mouth daily. [provider] Taking Active   cilostazol (PLETAL) 100 MG tablet [id number] Yes Take 1/2 (one-half) tablet by mouth twice daily [name], NP Taking Active   Continuous Blood Gluc Receiver (FREESTYLE LIBRE [address] READER) DEVI [id number] Yes Use with Elenor [place] system [name], Tommi Rumps, NP Taking Active   Continuous Blood Gluc Sensor (FREESTYLE LIBRE [place]) [place] [id number] Yes USE WITH FREESTYLE LIBRE READER [name], Tommi Rumps, NP Taking Active   cyclobenzaprine (FLEXERIL) 10 MG tablet [id number] Yes Take 1 tablet (10 mg total) by mouth at bedtime. [name], Tommi Rumps, NP Taking Active   FLUoxetine (PROZAC) 40 MG capsule [id number] Yes Take 1 capsule by mouth once daily [name], Tommi Rumps, NP Taking Active   glipiZIDE (GLUCOTROL) 5 MG tablet [id number] Yes TAKE 1 TABLET BY MOUTH TWICE DAILY BEFORE MEAL(S) [name], Tommi Rumps, NP Taking Active   hydrochlorothiazide (MICROZIDE) 12.5 MG capsule [id number] Yes Take 1 capsule by mouth once daily [name], Tommi Rumps, NP Taking Active   insulin glargine (LANTUS SOLOSTAR) 100 UNIT/ML Solostar Pen [id number] Yes Inject 16 Units into the skin 2 (two) times daily. [name], Tommi Rumps, NP Taking Active   Insulin Pen Needle 29G X 12.7MM MISC [id number] Yes Use as directed [name], Tommi Rumps, NP Taking Active   isosorbide mononitrate (IMDUR) 60 MG 24 hr tablet [id number] Yes Take 1.5 tablets (90 mg total) by mouth daily. [name], Tommi Rumps, NP Taking Active   levETIRAcetam (KEPPRA) 250 MG tablet [id number] Yes Take 1 tablet (250 mg total) by mouth in the morning, at noon, and at bedtime. [name], Tommi Rumps, NP Taking Active   metFORMIN (GLUCOPHAGE) 1000 MG tablet [id number] Yes Take 1 tablet (1,000 mg total) by mouth 2 (two) times daily with a meal. [name], Tommi Rumps, NP Taking Active   nitroGLYCERIN (NITROSTAT) 0.4 MG SL tablet [id number] Yes Place 1 tablet  (0.4 mg total) under the tongue every 5 (five) minutes x 3 doses as needed for chest pain. [name], Tommi Rumps, NP Taking Active   nystatin cream (MYCOSTATIN) [id number] Yes Apply 1 application topically 2 (two) times daily. [name], NP Taking Active   pravastatin (  PRAVACHOL) 40 MG tablet [id number] Yes Take 1 tablet (40 mg total) by mouth at bedtime. [name], Tommi Rumps, NP Taking Active   pregabalin (LYRICA) 100 MG capsule [id number] Yes Take 1 capsule (100 mg total) by mouth 2 (two) times daily. [name], Tommi Rumps, NP Taking Active   propranolol (INDERAL) 40 MG tablet [id number]  Take 1 tablet (40 mg total) by mouth 2 (two) times daily. [name], NP  Expired 01/28/21 2359   RELION PEN NEEDLES 31G X 6 MM MISC [id number] Yes 1 each by Other route as directed. [provider] Taking Active             Patient Active Problem List   Diagnosis Date Noted   Coronary artery disease involving native coronary artery of native heart without angina pectoris 12/23/2020   Type 2 diabetes mellitus with complication, without long-term current use of insulin ([place]) 12/23/2020   Hyperlipidemia 01/31/2014   Pain in limb 12/26/2013   Disturbance of skin sensation 12/26/2013   Peripheral arterial disease ([place]) 01/23/2013   Leukocytosis    Monocytosis    Acute MI, inferior wall, initial episode of care ([place]) 07/08/2012   Tobacco use disorder 07/08/2012   Fibromyalgia 07/08/2012   Depression 07/08/2012   Diabetes mellitus type II, uncontrolled ([place]) 07/08/2012     There is no immunization history on file for this patient.  Conditions to be addressed/monitored:  Hypertension, Hyperlipidemia, Diabetes, Coronary Artery Disease, Depression, and PVD  There are no care plans that you recently modified to display for this patient.   Current Barriers:  {pharmacybarriers:24917}  Pharmacist Clinical Goal(s):  Patient will {PHARMACYGOALCHOICES:24921} through collaboration with PharmD and provider.    Interventions: 1:1 collaboration with [name], NP regarding development and update of comprehensive plan of care as evidenced by provider attestation and co-signature Inter-disciplinary care team collaboration (see longitudinal plan of care) Comprehensive medication review performed; medication list updated in electronic medical record  BP Readings from Last 3 Encounters:  01/30/21 112/62  12/24/20 100/72  10/30/20 (!) 152/86     Hypertension (BP goal <130/80) -Not ideally controlled -Current treatment: Hydrochlorothiazide 12.5 mg 1 capsule daily Propranolol 40 mg 1 tablet twice daily -Medications previously tried: ***  -Current home readings: *** -Current dietary habits: *** -Current exercise habits: *** -{ACTIONS;DENIES/REPORTS:21021675::"Denies"} hypotensive/hypertensive symptoms -Educated on {CCM BP Counseling:25124} -Counseled to monitor BP at home ***, document, and provide log at future appointments -{CCMPHARMDINTERVENTION:25122}  Lab Results  Component Value Date   CHOL 146 10/30/2020   HDL 44.30 10/30/2020   LDLCALC 69 10/30/2020   LDLDIRECT 131.1 05/22/2014   TRIG 167.0 (H) 10/30/2020   CHOLHDL 3 10/30/2020     Hyperlipidemia: (LDL goal < 70) -Controlled -Current treatment: Pravastatin 40 mg 1 tablet daily -Medications previously tried: ***  -Current dietary patterns: *** -Current exercise habits: *** -Educated on {CCM HLD Counseling:25126} -{CCMPHARMDINTERVENTION:25122}  CAD/history of MI (Goal: ***) -{US controlled/uncontrolled:25276} -Current treatment  Nitroglycerin 0.4 mg 1 tablet as needed Isosorbide mononitrate 60 mg 1.5 tablets daily -Medications previously tried: ***  -{CCMPHARMDINTERVENTION:25122}  Lab Results  Component Value Date   HGBA1C 8.3 (A) 01/30/2021    Diabetes (A1c goal <7%) -Uncontrolled -Current medications: Glipizide 5 mg 1 tablet twice daily before meals Metformin 1000 mg 1 tablet twice daily Lantus 100  units/ml inject 16 units twice daily -Medications previously tried: ***  -Current home glucose readings fasting glucose: *** post prandial glucose: *** -{ACTIONS;DENIES/REPORTS:21021675::"Denies"} hypoglycemic/hyperglycemic symptoms -Current meal patterns:  breakfast: ***  lunch: ***  dinner: *** snacks: *** drinks: *** -  Current exercise: *** -Educated on {CCM DM COUNSELING:25123} -Counseled to check feet daily and get yearly eye exams -{CCMPHARMDINTERVENTION:25122} Freestyle libre? Dexcom?   Depression/Anxiety (Goal: ***) -{US controlled/uncontrolled:25276} -Current treatment: Fluoxetine 40 mg 1 capsule once daily Buspirone 15 mg 1 tablet three times daily -Medications previously tried/failed: *** -PHQ9: *** -GAD7: *** -Connected with *** for mental health support -Educated on {CCM mental health counseling:25127} -{CCMPHARMDINTERVENTION:25122}  Fibromyalgia (Goal: ***) -{US controlled/uncontrolled:25276} -Current treatment  Amitriptyline 50 mg 1 tablet at bedtime Pregabalin 100 mg 1 capsule twice daily Cyclobenzaprine? -Medications previously tried: ***  -{CCMPHARMDINTERVENTION:25122}  PVD (Goal: ***) -{US controlled/uncontrolled:25276} -Current treatment  Cilostazol 100 mg 1/2 tablet twice daily -Medications previously tried: ***  -{CCMPHARMDINTERVENTION:25122}  Last vitamin D No results found for: 25OHVITD2, 25OHVITD3, VD25OH Vitamin D 2000 units daily   Seizures (Goal: ***) -{US controlled/uncontrolled:25276} -Current treatment  Levetiracetam 250 mg 1 tablet three times daily -Medications previously tried: ***  -{CCMPHARMDINTERVENTION:25122}  Still smoking? Low dose CT scan?   Health Maintenance -Vaccine gaps: shingrix, COVID, influenza, tetanus -Current therapy:  Vitamin D 2000 units daily -Educated on {ccm supplement counseling:25128} -{CCM Patient satisfied:25129} -{CCMPHARMDINTERVENTION:25122} -needs repeat DEXA for smoking?  Patient  Goals/Self-Care Activities Patient will:  - {pharmacypatientgoals:24919}  Follow Up Plan: {CM FOLLOW UP ZYYQ:82500}   Medication Assistance: {MEDASSISTANCEINFO:25044}  Compliance/Adherence/Medication fill history: Care Gaps: Tetanus, shingrix, COVID, mammogram, urine microalbumin, pap smear, foot exam, colonoscopy, hep C screening  Star-Rating Drugs: Glipizide 63m - [facility] patient has a refill awaiting pick up. Pravastatin 449m- [facility]hone: [phone]: [phone] pill box? {Yes or If no, why not?:20788} Pt endorses ***% compliance  We discussed: {Pharmacy options:24294} Patient decided to: {US Pharmacy PlZPHX:50569}Care Plan and Follow Up Patient Decision:  {FOLLOWUP:24991}  Plan: {CM FOLLOW UP PLVXYI:01655}MaJeni SallesPharmD, BCTrout Creekharmacist [place] Br[place]

## 2021-06-29 ENCOUNTER — Other Ambulatory Visit: Payer: Self-pay | Admitting: Adult Health

## 2021-06-29 DIAGNOSIS — M797 Fibromyalgia: Secondary | ICD-10-CM

## 2021-06-29 DIAGNOSIS — E1165 Type 2 diabetes mellitus with hyperglycemia: Secondary | ICD-10-CM

## 2021-07-16 ENCOUNTER — Ambulatory Visit: Payer: Medicare Other

## 2021-07-20 ENCOUNTER — Telehealth: Payer: Self-pay | Admitting: Pharmacist

## 2021-07-20 NOTE — Chronic Care Management (AMB) (Signed)
    Chronic Care Management Pharmacy Assistant   Name: [name]  MRN: [id number] DOB: Jan 30, 1956  07/21/2021 APPOINTMENT REMINDER   Called [name], No answer, left message of appointment on 07/21/2021 at 3:30 via office visit with Gaylord Shih, Pharm D. Notified to have blood pressure monitor, all medications, supplements, blood pressure and/or blood sugar logs available during appointment and to return call if need to reschedule.  Care Gaps: AWV - completed 02/19/2021 Covid 19 vaccine - never done Pneumovax - never done Hep C screen - never done Tetanus/TDAP vaccine - never done Zoster vaccine - never done Pap smear - never done Colonoscopy - never done Mammogram - never done Microalbumin urine - overdue 04/05/2014 Foot exam - overdue 0716/2022 Last BP - 112/62 on 01/30/2021 Last A1C - 8.3 on 01/30/2021  Star Rating Drug: Glipizide 5mg  - last filled 06/21/2021 90DS at [facility]  Pravastatin 40mg  - last filled 06/29/2021 90DS at [facility]  Any gaps in medications fill history? No  08/29/2021 [facility]  [name] [phone]

## 2021-07-21 ENCOUNTER — Ambulatory Visit (INDEPENDENT_AMBULATORY_CARE_PROVIDER_SITE_OTHER): Payer: Medicare Other | Admitting: Pharmacist

## 2021-07-21 ENCOUNTER — Other Ambulatory Visit: Payer: Self-pay

## 2021-07-21 DIAGNOSIS — I10 Essential (primary) hypertension: Secondary | ICD-10-CM

## 2021-07-21 DIAGNOSIS — E1165 Type 2 diabetes mellitus with hyperglycemia: Secondary | ICD-10-CM

## 2021-07-21 NOTE — Progress Notes (Signed)
Chronic Care Management Pharmacy Note  07/27/2021 Name:  [name] MRN:  [id number] DOB:  Jul 16, 1956  Summary: Pt reports she is having significant dry mouth and daytime fatigue A1c is not at goal < 7%  Recommendations/Changes made from today's visit: -Recommended decreasing to 5 of melatonin per night -Recommend spirometry for baseline breathing testing -Recommended restarting aspirin 81 mg daily -Recommend taper of amitriptyline due to dry mouth -Recommend starting Ozempic 0.25 mg and slow titration if patient doesn't tolerate  Plan: Follow up DM assessment in 2-3 weeks   Subjective: [name] is an 65 y.o. year old female who is a primary patient of [name], NP.  The CCM team was consulted for assistance with disease management and care coordination needs.    Engaged with patient face to face for initial visit in response to provider referral for pharmacy case management and/or care coordination services.   Consent to Services:  The patient was given the following information about Chronic Care Management services today, agreed to services, and gave verbal consent: 1. CCM service includes personalized support from designated clinical staff supervised by the primary care provider, including individualized plan of care and coordination with other care providers 2. 24/7 contact phone numbers for assistance for urgent and routine care needs. 3. Service will only be billed when office clinical staff spend 20 minutes or more in a month to coordinate care. 4. Only one practitioner may furnish and bill the service in a calendar month. 5.The patient may stop CCM services at any time (effective at the end of the month) by phone call to the office staff. 6. The patient will be responsible for cost sharing (co-pay) of up to 20% of the service fee (after annual deductible is met). Patient agreed to services and consent obtained.  Patient Care Team: [name], NP as PCP  - General (Family Medicine) Minus Breeding, MD as PCP - Cardiology (Cardiology) Nobie Putnam, MD (Hematology and Oncology) [place], Peggyann Juba as Referring Physician (Physician Assistant) Kathrynn Ducking, MD as Consulting Physician (Neurology) Viona Gilmore, [facility] as Pharmacist (Pharmacist)  Recent office visits: 01/30/2021 [name] NP (PCP) - Patient was seen for Uncontrolled type 2 diabetes and 3 other issues. Increased dose of Insulin Glargine 16 units daily to 16 units two times daily. Started on Augmentin 875/162m 1 tablet twice daily.  No follow up noted.  02/19/21 LRandel Pigg LPN: Patient presented for AWV.   Recent consult visits: 12/24/2020 JMinus BreedingMD (Cardiology) - Patient was seen for Coronary artery disease and 3 other issues.  Discontinued Fluticasone. Referred to Pulmonology. Follow up in 12 months.  Hospital visits: None in previous 6 months   Objective:  Lab Results  Component Value Date   CREATININE 0.89 10/30/2020   BUN 14 10/30/2020   GFR 69.36 10/30/2020   GFRNONAA 54 (L) 08/21/2018   GFRAA >60 08/21/2018   NA 140 10/30/2020   K 4.6 10/30/2020   CALCIUM 10.8 (H) 10/30/2020   CO2 24 10/30/2020   GLUCOSE 101 (H) 10/30/2020    Lab Results  Component Value Date/Time   HGBA1C 8.3 (A) 01/30/2021 02:40 PM   HGBA1C 8.3 (H) 10/30/2020 01:38 PM   HGBA1C 7.6 (A) 07/30/2020 11:02 AM   HGBA1C 8.5 (H) 04/08/2020 01:58 PM   GFR 69.36 10/30/2020 01:38 PM   GFR 71.62 04/08/2020 01:58 PM   MICROALBUR 1.4 04/05/2013 02:05 PM    Last diabetic Eye exam:  Lab Results  Component Value Date/Time  HMDIABEYEEXA no DR 05/11/2013 12:00 AM    Last diabetic Foot exam: No results found for: HMDIABFOOTEX   Lab Results  Component Value Date   CHOL 146 10/30/2020   HDL 44.30 10/30/2020   LDLCALC 69 10/30/2020   LDLDIRECT 131.1 05/22/2014   TRIG 167.0 (H) 10/30/2020   CHOLHDL 3 10/30/2020    Hepatic Function Latest Ref Rng & Units 10/30/2020 04/08/2020  03/12/2015  Total Protein 6.0 - 8.3 g/dL 6.6 6.6 7.5  Albumin 3.5 - 5.2 g/dL 3.8 4.0 4.1  AST 0 - 37 U/L 15 10 19   ALT 0 - 35 U/L 15 8 23   Alk Phosphatase 39 - 117 U/L 88 51 97  Total Bilirubin 0.2 - 1.2 mg/dL 0.2 0.2 0.26  Bilirubin, Direct 0.0 - 0.3 mg/dL - - -    Lab Results  Component Value Date/Time   TSH 2.33 10/30/2020 01:38 PM   TSH 1.43 04/08/2020 01:58 PM    CBC Latest Ref Rng & Units 10/30/2020 04/08/2020 08/21/2018  WBC 4.0 - 10.5 K/uL 15.4(H) 12.1(H) 25.9(H)  Hemoglobin 12.0 - 15.0 g/dL 14.1 13.7 18.4(H)  Hematocrit 36.0 - 46.0 % 42.4 41.5 55.6(H)  Platelets 150.0 - 400.0 K/uL 319.0 318.0 333    No results found for: VD25OH  Clinical ASCVD: Yes  The ASCVD Risk score (Arnett DK, et al., 2019) failed to calculate for the following reasons:   The patient has a prior MI or stroke diagnosis    Depression screen [facility] 2/9 02/19/2021 07/17/2015  Decreased Interest 0 3  Down, Depressed, Hopeless 0 3  PHQ - 2 Score 0 6  Altered sleeping - 3  Tired, decreased energy - 3  Change in appetite - 3  Feeling bad or failure about yourself  - 3  Trouble concentrating - 3  Moving slowly or fidgety/restless - 3  Suicidal thoughts - 3  PHQ-9 Score - 27      Social History   Tobacco Use  Smoking Status Every Day   Packs/day: 1.00   Years: 43.00   Pack years: 43.00   Types: Cigarettes  Smokeless Tobacco Never   BP Readings from Last 3 Encounters:  01/30/21 112/62  12/24/20 100/72  10/30/20 (!) 152/86   Pulse Readings from Last 3 Encounters:  01/30/21 74  12/24/20 75  08/04/20 73   Wt Readings from Last 3 Encounters:  01/30/21 159 lb 3.2 oz (72.2 kg)  01/29/21 153 lb (69.4 kg)  12/24/20 153 lb (69.4 kg)   BMI Readings from Last 3 Encounters:  01/30/21 28.20 kg/m  01/29/21 27.10 kg/m  12/24/20 27.10 kg/m    Assessment/Interventions: Review of patient past medical history, allergies, medications, health status, including review of consultants reports,  laboratory and other test data, was performed as part of comprehensive evaluation and provision of chronic care management services.   SDOH:  (Social Determinants of Health) assessments and interventions performed: Yes SDOH Interventions    Flowsheet Row Most Recent Value  SDOH Interventions   Financial Strain Interventions Other (Comment)  [working on PAP]  Transportation Interventions Intervention Not Indicated      SDOH Screenings   Alcohol Screen: Low Risk    Last Alcohol Screening Score (AUDIT): 0  Depression (PHQ2-9): Low Risk    PHQ-2 Score: 0  Financial Resource Strain: Medium Risk   Difficulty of Paying Living Expenses: Somewhat hard  Food Insecurity: No Food Insecurity   Worried About Charity fundraiser in the Last Year: Never true   Ran Out of  Food in the Last Year: Never true  Housing: Low Risk    Last Housing Risk Score: 0  Physical Activity: Inactive   Days of Exercise per Week: 0 days   Minutes of Exercise per Session: 0 min  Social Connections: Moderately Isolated   Frequency of Communication with Friends and Family: Twice a week   Frequency of Social Gatherings with Friends and Family: Twice a week   Attends Religious Services: Never   Marine scientist or Organizations: No   Attends Music therapist: Never   Marital Status: Married  Stress: Not on file  Tobacco Use: High Risk   Smoking Tobacco Use: Every Day   Smokeless Tobacco Use: Never   Passive Exposure: Not on file  Transportation Needs: No Transportation Needs   Lack of Transportation (Medical): No   Lack of Transportation (Non-Medical): No   Patient reports her biggest concerns right now are working on her sleep schedule and her terrible dry mouth. She used to work night shift for years and now goes to bed around 4-7am and gets up at 1-2pm. She wants to get up earlier and feels tired and sleepy all the time.   Patient does have dry mouth so severe and has to pull her tongue  away from her cheek. She also sees hallucinations and thinks Keppra is the cause of it but she is unsure.   Patient also struggles to get her diabetes under control. She is the one that requested the insulin because she was able to make adjustments to the dose and thinks that is more helpful for her.  CCM Care Plan  Allergies  Allergen Reactions   Erythromycin Other (See Comments)   Statins     SEVERE MUSCLE PAIN - failed lipitor, Livalo, red yeast rice (lovastatin), and she thinks Zocor and pravastatin   Ace Inhibitors Cough    Patient refuses to take meds   Cymbalta [Duloxetine Hcl]     Drowsiness    Medications Reviewed Today     Reviewed by Viona Gilmore, [facility] (Pharmacist) on 07/21/21 at [place] List Status: <None>   Medication Order Taking? Sig Documenting Provider Last Dose Status Informant  amitriptyline (ELAVIL) 50 MG tablet [id number] Yes TAKE 1 TABLET BY MOUTH AT BEDTIME [name], Tommi Rumps, NP Taking Active   busPIRone (BUSPAR) 15 MG tablet [id number] Yes TAKE 1 TABLET BY MOUTH THREE TIMES DAILY [name], Tommi Rumps, NP Taking Active   Cholecalciferol (VITAMIN D3) 2000 units TABS [id number]  Take 1 tablet by mouth daily. [provider]  Active   cilostazol (PLETAL) 100 MG tablet [id number] Yes Take 1/2 (one-half) tablet by mouth twice daily [name], Tommi Rumps, NP Taking Active   Continuous Blood Gluc Receiver (FREESTYLE LIBRE 14 DAY READER) [place] [id number]  Use with Elenor [place]ato system [name], Tommi Rumps, NP  Active   Continuous Blood Gluc Sensor (FREESTYLE LIBRE 14 DAY SENSOR) [place] [id number]  USE AS DIRECTED [name], Tommi Rumps, NP  Active   cyclobenzaprine (FLEXERIL) 10 MG tablet [id number] Yes Take 1 tablet (10 mg total) by mouth at bedtime. [name], Tommi Rumps, NP Taking Active   FLUoxetine (PROZAC) 40 MG capsule [id number] Yes Take 1 capsule by mouth once daily [name], Tommi Rumps, NP Taking Active   glipiZIDE (GLUCOTROL) 5 MG tablet [id number] Yes TAKE 1 TABLET BY MOUTH TWICE DAILY BEFORE MEAL(S)  [name], Tommi Rumps, NP Taking Active   hydrochlorothiazide (MICROZIDE) 12.5 MG capsule [id number] Yes Take 1 capsule by mouth once daily [name], Tommi Rumps, NP Taking Active   insulin glargine (  LANTUS SOLOSTAR) 100 UNIT/ML Solostar Pen [id number] Yes Inject 16 Units into the skin 2 (two) times daily. [name], Tommi Rumps, NP Taking Active   Insulin Pen Needle 29G X 12.7MM MISC [id number]  Use as directed [name], Tommi Rumps, NP  Active   isosorbide mononitrate (IMDUR) 60 MG 24 hr tablet [id number] Yes Take 1.5 tablets (90 mg total) by mouth daily. [name], Tommi Rumps, NP Taking Active   levETIRAcetam (KEPPRA) 250 MG tablet [id number] Yes Take 1 tablet (250 mg total) by mouth in the morning, at noon, and at bedtime. [name], Tommi Rumps, NP Taking Active   metFORMIN (GLUCOPHAGE) 1000 MG tablet [id number] Yes TAKE 1 TABLET BY MOUTH TWICE DAILY WITH MEALS [name], Tommi Rumps, NP Taking Active   nitroGLYCERIN (NITROSTAT) 0.4 MG SL tablet [id number] Yes Place 1 tablet (0.4 mg total) under the tongue every 5 (five) minutes x 3 doses as needed for chest pain. [name], Tommi Rumps, NP Taking Active   nystatin cream (MYCOSTATIN) [id number] Yes Apply 1 application topically 2 (two) times daily. [name], Tommi Rumps, NP Taking Active   pravastatin (PRAVACHOL) 40 MG tablet [id number] Yes TAKE 1 TABLET BY MOUTH AT BEDTIME [name], Tommi Rumps, NP Taking Active   pregabalin (LYRICA) 100 MG capsule [id number] Yes Take 1 capsule by mouth twice daily [name], Tommi Rumps, NP Taking Active   propranolol (INDERAL) 40 MG tablet [id number] Yes Take 1 tablet (40 mg total) by mouth 2 (two) times daily. [name], NP Taking Active   RELION PEN NEEDLES 31G X 6 MM [place] [id number]  1 each by Other route as directed. [provider]  Active             Patient Active Problem List   Diagnosis Date Noted   Coronary artery disease involving native coronary artery of native heart without angina pectoris 12/23/2020   Type 2 diabetes mellitus with complication, without long-term  current use of insulin ([place]) 12/23/2020   Hyperlipidemia 01/31/2014   Pain in limb 12/26/2013   Disturbance of skin sensation 12/26/2013   Peripheral arterial disease ([place]) 01/23/2013   Leukocytosis    Monocytosis    Acute MI, inferior wall, initial episode of care ([place]) 07/08/2012   Tobacco use disorder 07/08/2012   Fibromyalgia 07/08/2012   Depression 07/08/2012   Diabetes mellitus type II, uncontrolled ([place]) 07/08/2012     There is no immunization history on file for this patient.  Conditions to be addressed/monitored:  Hypertension, Hyperlipidemia, Diabetes, Coronary Artery Disease, COPD, Depression, Tobacco use, and Fibromyalgia and Seizures  Care Plan : [place]  Updates made by Viona Gilmore, [place] since 07/27/2021 12:00 AM     Problem: Problem: Hypertension, Hyperlipidemia, Diabetes, Coronary Artery Disease, COPD, Depression, Tobacco use, and Fibromyalgia and Seizures      Long-Range Goal: Patient-Specific Goal   Start Date: 07/21/2021  Expected End Date: 07/21/2022  This Visit's Progress: On track  Priority: High  Note:   Current Barriers:  Unable to independently afford treatment regimen Unable to independently monitor therapeutic efficacy Unable to achieve control of diabetes   Pharmacist Clinical Goal(s):  Patient will verbalize ability to afford treatment regimen achieve adherence to monitoring guidelines and medication adherence to achieve therapeutic efficacy achieve control of diabetes as evidenced by A1c  through collaboration with PharmD and provider.   Interventions: 1:1 collaboration with [name], NP regarding development and update of comprehensive plan of care as evidenced by provider attestation and co-signature Inter-disciplinary care team collaboration (see longitudinal plan of care) Comprehensive medication review performed; medication list updated in  electronic medical record  Hypertension (BP goal <130/80) -Not  ideally controlled -Current treatment: Hydrochlorothiazide 12.5 mg 1 capsule daily Propranolol 40 mg 1 tablet twice daily -Medications previously tried: none  -Current home readings: usually <140/<90 (checks 4 times a week) - 70/40 (no lows recently)  -Current dietary habits: doesn't add salt and only puts it on eggs; not eating out much -Current exercise habits: none -Denies hypotensive/hypertensive symptoms -Educated on BP goals and benefits of medications for prevention of heart attack, stroke and kidney damage; Importance of home blood pressure monitoring; Proper BP monitoring technique; -Counseled to monitor BP at home weekly, document, and provide log at future appointments -Counseled on diet and exercise extensively Recommended to continue current medication  Hyperlipidemia: (LDL goal < 70) -Controlled -Current treatment: Pravastatin 40 mg 1 tablet at bedtime -Medications previously tried: none  -Current dietary patterns: doesn't eat out much -Current exercise habits: none -Educated on Cholesterol goals;  Benefits of statin for ASCVD risk reduction; Importance of limiting foods high in cholesterol; -Counseled on diet and exercise extensively Recommended to continue current medication  PAD (Goal: prevent heart events) -Not ideally controlled -Current treatment  Cilostazol 100 mg 1/2 tablet twice daily Pravastatin 40 mg 1 tablet at bedtime -Medications previously tried: aspirin  -Recommended to restart taking aspirin.  CAD (Goal: prevent heart events) -Not ideally controlled -Current treatment  Pravastatin 40 mg 1 tablet at bedtime Isosorbide mononitrate 60 mg 1.5 tablets daily Nitroglycerin 0.4 mg 1 tablet as needed -Medications previously tried: aspirin (unknown) -Recommended to restart aspirin.  Diabetes (A1c goal <7%) -Uncontrolled -Current medications: Metformin 1000 mg 1 tablet twice daily Glipizide 5 mg 1 tablet twice daily Lantus 16 units twice daily -  pt is injecting 8 units three times a day  -Medications previously tried: n/a  -Current home glucose readings fasting glucose: using Freestyle libre (pt is having lows in the afternoon time) post prandial glucose: n/a -Reports hypoglycemic/hyperglycemic symptoms -Current meal patterns:  breakfast: not eating much  lunch: sandwich or feeling sick  dinner: vegetable and meat snacks: n/a drinks: n/a -Current exercise: none -Educated on A1c and blood sugar goals; Prevention and management of hypoglycemic episodes; Benefits of routine self-monitoring of blood sugar; Carbohydrate counting and/or plate method -Counseled to check feet daily and get yearly eye exams -Counseled on diet and exercise extensively Counseled on making sure to have a protein and carb with every meal to prevent lows. Recommended switching Lantus to 8 units twice daily.  Depression/Anxiety (Goal: minimize symptoms) -Controlled -Current treatment: Buspirone 15 mg 1 tablet three times daily Fluoxetine 40 mg 1 capsule daily Amitriptyline daily -Medications previously tried/failed: unknown -PHQ9: 0 -GAD7: n/a -Educated on Benefits of medication for symptom control Benefits of cognitive-behavioral therapy with or without medication -Recommended to continue current medication  Tobacco use (Goal quit smoking) -Uncontrolled -Previous quit attempts:  Zyban (sick), Chantix (heart palpitations), therapy, hypnosis -Current treatment  No medications -Patient smokes Within 30 minutes of waking -Patient triggers include: stress and habit -On a scale of 1-10, reports MOTIVATION to quit is 4 -On a scale of 1-10, reports CONFIDENCE in quitting is 3 -Provided contact information for [place] Quit Line (1-800-QUIT-NOW) and encouraged patient to reach out to this group for support. -Counseled on benefits of smoking cessation.  Fibromyalgia (Goal: minimize pain) -Not ideally controlled -Current treatment  Pregabalin 100 mg 1  capsule twice daily Amitriptyline 50 mg 1 tablet at bedtime -Medications previously tried: none  -Recommended decreasing amitriptyline to 25 mg at bedtime due to dry  mouth symptoms.  Seizures (Goal: prevent seizures) -Not ideally controlled -Current treatment  Levetiracetam 250 mg 1 tablet three times daily - taking 2 in the morning, 1 in daytime and 1 in the evening -Medications previously tried: none  -Collaborated with PCP to clarify directions.   Health Maintenance -Vaccine gaps: COVID, prevnar, tetanus, shingrix -Current therapy:  Cyclobenzaprine 10 mg at bedtime  Vitamin D 2000 units daily Nystatin cream as needed Melatonin 10 mg - at bedtime (years ago) -Educated on Cost vs benefit of each product must be carefully weighed by individual consumer -Patient is satisfied with current therapy and denies issues -Recommended decreasing melatonin to 5 mg per night.  Patient Goals/Self-Care Activities Patient will:  - take medications as prescribed as evidenced by patient report and record review check glucose with CGM, document, and provide at future appointments engage in dietary modifications by increasing protein intake with each meal  Follow Up Plan: The care management team will reach out to the patient again over the next 30 days.        Medication Assistance: None required.  Patient affirms current coverage meets needs.  Compliance/Adherence/Medication fill history: Care Gaps: Covid 19 vaccine - never done Pneumovax - never done Hep C screen - never done Tetanus/TDAP vaccine - never done Zoster vaccine - never done Pap smear - never done Colonoscopy - never done Mammogram - never done Microalbumin urine - overdue 04/05/2014 Foot exam - overdue 0716/2022 Last BP - 112/62 on 01/30/2021 Last A1C - 8.3 on 01/30/2021  Star-Rating Drugs: Glipizide 32m - last filled 06/21/2021 90DS at [facility]  Pravastatin 494m- [facility] [place] [address]hone: [phone]: [phone] pill box? No - drawer - pulls them out every day Pt endorses 90% compliance  - once or twice a month  We discussed: Current pharmacy is preferred with insurance plan and patient is satisfied with pharmacy services Patient decided to: Continue current medication management strategy  Care Plan and Follow Up Patient Decision:  Patient agrees to Care Plan and Follow-up.  Plan: The care management team will reach out to the patient again over the next 30 days.  MaJeni SallesPharmD, BCPoplar Hillsharmacist [place]

## 2021-07-27 DIAGNOSIS — E1165 Type 2 diabetes mellitus with hyperglycemia: Secondary | ICD-10-CM

## 2021-07-27 DIAGNOSIS — I10 Essential (primary) hypertension: Secondary | ICD-10-CM

## 2021-07-27 NOTE — Patient Instructions (Signed)
Hi [name],  It was great to get to meet you in person! Below is a summary of some of the topics we discussed. I will reach back out once I have a chance to talk with [facility]!  Please reach out to me if you have any questions or need anything before our follow up!  Best, Maddie  Gaylord Shih, PharmD, BCACP Clinical Pharmacist [facility] [place] [phone]     Visit Information   Goals Addressed   None    There are no care plans to display for this patient.   Ms. [name] was given information about Chronic Care Management services today including:  CCM service includes personalized support from designated clinical staff supervised by her physician, including individualized plan of care and coordination with other care providers 24/7 contact phone numbers for assistance for urgent and routine care needs. Standard insurance, coinsurance, copays and deductibles apply for chronic care management only during months in which we provide at least 20 minutes of these services. Most insurances cover these services at 100%, however patients may be responsible for any copay, coinsurance and/or deductible if applicable. This service may help you avoid the need for more expensive face-to-face services. Only one practitioner may furnish and bill the service in a calendar month. The patient may stop CCM services at any time (effective at the end of the month) by phone call to the office staff.  Patient agreed to services and verbal consent obtained.   Patient verbalizes understanding of instructions provided today and agrees to view in MyChart.  The pharmacy team will reach out to the patient again over the next 30 days.   Verner Chol, [facility]

## 2021-07-29 ENCOUNTER — Other Ambulatory Visit: Payer: Self-pay | Admitting: Adult Health

## 2021-07-30 NOTE — Telephone Encounter (Signed)
Pt needs a DM f/u °

## 2021-08-03 ENCOUNTER — Other Ambulatory Visit: Payer: Self-pay | Admitting: Adult Health

## 2021-08-04 ENCOUNTER — Other Ambulatory Visit: Payer: Self-pay | Admitting: Adult Health

## 2021-08-04 DIAGNOSIS — E1165 Type 2 diabetes mellitus with hyperglycemia: Secondary | ICD-10-CM

## 2021-08-06 ENCOUNTER — Ambulatory Visit (INDEPENDENT_AMBULATORY_CARE_PROVIDER_SITE_OTHER): Payer: Medicare Other | Admitting: Adult Health

## 2021-08-06 ENCOUNTER — Encounter: Payer: Self-pay | Admitting: Adult Health

## 2021-08-06 VITALS — BP 106/62 | HR 77 | Temp 98.1°F | Ht 63.0 in | Wt 150.0 lb

## 2021-08-06 DIAGNOSIS — M797 Fibromyalgia: Secondary | ICD-10-CM | POA: Diagnosis not present

## 2021-08-06 DIAGNOSIS — I10 Essential (primary) hypertension: Secondary | ICD-10-CM

## 2021-08-06 DIAGNOSIS — R300 Dysuria: Secondary | ICD-10-CM | POA: Diagnosis not present

## 2021-08-06 DIAGNOSIS — E1165 Type 2 diabetes mellitus with hyperglycemia: Secondary | ICD-10-CM

## 2021-08-06 DIAGNOSIS — G40919 Epilepsy, unspecified, intractable, without status epilepticus: Secondary | ICD-10-CM

## 2021-08-06 LAB — URINALYSIS, ROUTINE W REFLEX MICROSCOPIC
Bilirubin Urine: NEGATIVE
Hgb urine dipstick: NEGATIVE
Ketones, ur: NEGATIVE
Nitrite: POSITIVE — AB
Specific Gravity, Urine: 1.01 (ref 1.000–1.030)
Total Protein, Urine: NEGATIVE
Urine Glucose: 250 — AB
Urobilinogen, UA: 0.2 (ref 0.0–1.0)
pH: 6 (ref 5.0–8.0)

## 2021-08-06 LAB — BASIC METABOLIC PANEL
BUN: 24 mg/dL — ABNORMAL HIGH (ref 6–23)
CO2: 26 mEq/L (ref 19–32)
Calcium: 10.2 mg/dL (ref 8.4–10.5)
Chloride: 104 mEq/L (ref 96–112)
Creatinine, Ser: 1.81 mg/dL — ABNORMAL HIGH (ref 0.40–1.20)
GFR: 29.43 mL/min — ABNORMAL LOW (ref >60.00)
Glucose, Bld: 199 mg/dL — ABNORMAL HIGH (ref 70–99)
Potassium: 4.7 mEq/L (ref 3.5–5.1)
Sodium: 138 mEq/L (ref 135–145)

## 2021-08-06 LAB — HEMOGLOBIN A1C: Hgb A1c MFr Bld: 8.8 % — ABNORMAL HIGH (ref 4.6–6.5)

## 2021-08-06 MED ORDER — AMITRIPTYLINE HCL 25 MG PO TABS: 25.0000 mg | ORAL_TABLET | Freq: Every day | ORAL | 1 refills | Status: DC

## 2021-08-06 NOTE — Patient Instructions (Addendum)
Take your insulin 16 units twice a day.   Cut Amitriptyline in half   Will follow up with you regarding your blood work and urinalysis   I will see you back in three months

## 2021-08-06 NOTE — Progress Notes (Signed)
Subjective:    Patient ID: [name], female    DOB: 1956/03/19, 65 y.o.   MRN: [id number]  HPI 65 year old female who  has a past medical history of Acute MI, inferior wall, initial episode of care [facility]) (07/08/2012), Anxiety, Asthma, Coronary artery disease, Depression, Diabetes mellitus, Fibromyalgia, Leukocytosis, Monocytosis, Peripheral arterial disease (HCC) (01/23/2013), and Tobacco use disorder (07/08/2012).  She presents to the office today for follow up regarding multiple health issues   DM Typ 2 uncontrolled -currently prescribed metformin 1000 mg twice daily, glipizide 5 mg twice daily, and Lantus 16 units in the morning and 16 units nightly.  At home she was taking her insulin 8 units 3 times a day.  She does use the [facility] [place] system to monitor her blood sugars.  She was recently seen by our clinical pharmacist who recommended starting Ozempic 0.25 mg and slow titration if patient does not tolerate.  Her libre reader, she is in target 52% of the time and above target 43% of the time.  Most of her blood sugars are elevated from 12 AM to 6 AM with upwards of 300. Lab Results  Component Value Date   HGBA1C 8.8 (H) 08/06/2021   HTN - currently prescribed hydrochlorothiazide 12.5 mg and Inderal 40 mg twice daily.  She denies dizziness, lightheadedness, chest pain, or shortness of breath BP Readings from Last 3 Encounters:  08/06/21 106/62  01/30/21 112/62  12/24/20 100/72   Fibromylagia -prescribed pregabalin 100 mg twice daily and amitriptyline 50 mg at bedtime.  When she was seen by her clinical pharmacist she was complaining of dry mouth symptoms.  It was recommended that she decrease her amitriptyline to 25 mg at bedtime  Epilepsy -taking Keppra 500 mg in the morning, 250 mg at lunch, and 250 mg before bed  UTI -she reports that over the last couple months she has been experiencing dysuria, frequency, and urgency.  Does not endorse fevers, chills, low back pain, or pelvic  pain/pressure  Review of Systems See HPI   Past Medical History:  Diagnosis Date   Acute MI, inferior wall, initial episode of care (HCC) 07/08/2012   Cath-07/08/11 -distal RCA stent DES (99%), LAD 20-30%. EF 50% inferior hypokinesis  (infarct)   Anxiety    Asthma    Coronary artery disease    Depression    Diabetes mellitus    Fibromyalgia    Leukocytosis    Monocytosis    Peripheral arterial disease (HCC) 01/23/2013   Tobacco use disorder 07/08/2012    Social History   Socioeconomic History   Marital status: Married    Spouse name: Not on file   Number of children: 3   Years of education: college   Highest education level: Not on file  Occupational History   Not on file  Tobacco Use   Smoking status: Every Day    Packs/day: 1.00    Years: 43.00    Pack years: 43.00    Types: Cigarettes   Smokeless tobacco: Never  Substance and Sexual Activity   Alcohol use: No    Alcohol/week: 0.0 standard drinks    Comment: Rarely   Drug use: No   Sexual activity: Yes  Other Topics Concern   Not on file  Social History Narrative   Lives with husband, married for 20 years.      They have three grown chlildren.   She is not currently working and is on disability   Highest level of education:  [employer]  graduate   Pets: two dogs and three rabbits.       Husband is a long Production assistant, radio.       Is unable to enjoy any activities because of body pains.       Social Determinants of Health   Financial Resource Strain: Medium Risk   Difficulty of Paying Living Expenses: Somewhat hard  Food Insecurity: No Food Insecurity   Worried About Programme researcher, broadcasting/film/video in the Last Year: Never true   Ran Out of Food in the Last Year: Never true  Transportation Needs: No Transportation Needs   Lack of Transportation (Medical): No   Lack of Transportation (Non-Medical): No  Physical Activity: Inactive   Days of Exercise per Week: 0 days   Minutes of Exercise per Session: 0 min  Stress:  Not on file  Social Connections: Moderately Isolated   Frequency of Communication with Friends and Family: Twice a week   Frequency of Social Gatherings with Friends and Family: Twice a week   Attends Religious Services: Never   Database administrator or Organizations: No   Attends Engineer, structural: Never   Marital Status: Married  Catering manager Violence: Not At Risk   Fear of Current or Ex-Partner: No   Emotionally Abused: No   Physically Abused: No   Sexually Abused: No    Past Surgical History:  Procedure Laterality Date   ABDOMINAL AORTAGRAM N/A 02/07/2013   Procedure: ABDOMINAL Ronny Flurry;  Surgeon: Iran Ouch, MD;  Location: [facility] CATH LAB;  Service: Cardiovascular;  Laterality: N/A;   CARDIAC CATHETERIZATION     CORONARY STENT PLACEMENT     CYST REMOVAL TRUNK  1977   tailbone   LEFT HEART CATHETERIZATION WITH CORONARY ANGIOGRAM N/A 07/07/2012   Procedure: LEFT HEART CATHETERIZATION WITH CORONARY ANGIOGRAM;  Surgeon: Kathleene Hazel, MD;  Location: [facility] CATH LAB;  Service: Cardiovascular;  Laterality: N/A;   PERCUTANEOUS CORONARY STENT INTERVENTION (PCI-S)  07/07/2012   Procedure: PERCUTANEOUS CORONARY STENT INTERVENTION (PCI-S);  Surgeon: Kathleene Hazel, MD;  Location: [facility] CATH LAB;  Service: Cardiovascular;;   TONSILLECTOMY      Family History  Problem Relation Age of Onset   Kidney disease Mother    Diabetes Mother        DM Type II   Alzheimer's disease Mother    Diabetes Father        IDDM   Heart disease Father    Cancer Father        bone cancer died at 62   Cancer Sister        bone cancer at age 44   Multiple sclerosis Daughter    Diabetes Son 2       DM Type I   Heart disease Maternal Grandfather    Heart disease Paternal Grandmother    Diabetes Son 78       DM Type I    Allergies  Allergen Reactions   Erythromycin Other (See Comments)   Statins     SEVERE MUSCLE PAIN - failed lipitor, Livalo, red yeast rice  (lovastatin), and she thinks Zocor and pravastatin   Ace Inhibitors Cough    Patient refuses to take meds   Cymbalta [Duloxetine Hcl]     Drowsiness    Current Outpatient Medications on File Prior to Visit  Medication Sig Dispense Refill   amitriptyline (ELAVIL) 50 MG tablet TAKE 1 TABLET BY MOUTH AT BEDTIME 90 tablet 2   busPIRone (BUSPAR) 15 MG  tablet TAKE 1 TABLET BY MOUTH THREE TIMES DAILY 270 tablet 1   Cholecalciferol (VITAMIN D3) 2000 units TABS Take 1 tablet by mouth daily.     cilostazol (PLETAL) 100 MG tablet Take 1/2 (one-half) tablet by mouth twice daily 90 tablet 0   Continuous Blood Gluc Receiver (FREESTYLE LIBRE 14 DAY READER) DEVI Use with libre system 1 each 0   Continuous Blood Gluc Sensor (FREESTYLE LIBRE 14 DAY SENSOR) MISC USE AS DIRECTED 6 each 1   cyclobenzaprine (FLEXERIL) 10 MG tablet Take 1 tablet (10 mg total) by mouth at bedtime. 90 tablet 0   FLUoxetine (PROZAC) 40 MG capsule Take 1 capsule by mouth once daily 90 capsule 0   glipiZIDE (GLUCOTROL) 5 MG tablet TAKE 1 TABLET BY MOUTH TWICE DAILY BEFORE MEAL(S) 180 tablet 0   hydrochlorothiazide (MICROZIDE) 12.5 MG capsule Take 1 capsule by mouth once daily 90 capsule 0   Insulin Pen Needle (B-D ULTRAFINE III SHORT PEN) 31G X 8 MM MISC USE AS DIRECTED 100 each 0   isosorbide mononitrate (IMDUR) 60 MG 24 hr tablet Take 1.5 tablets (90 mg total) by mouth daily. 135 tablet 3   metFORMIN (GLUCOPHAGE) 1000 MG tablet TAKE 1 TABLET BY MOUTH TWICE DAILY WITH MEALS 180 tablet 0   nitroGLYCERIN (NITROSTAT) 0.4 MG SL tablet Place 1 tablet (0.4 mg total) under the tongue every 5 (five) minutes x 3 doses as needed for chest pain. 30 tablet 12   nystatin cream (MYCOSTATIN) Apply 1 application topically 2 (two) times daily. 30 g 2   pravastatin (PRAVACHOL) 40 MG tablet TAKE 1 TABLET BY MOUTH AT BEDTIME 90 tablet 0   pregabalin (LYRICA) 100 MG capsule Take 1 capsule by mouth twice daily 180 capsule 1   RELION PEN NEEDLES 31G X 6 MM  MISC 1 each by Other route as directed.     insulin glargine (LANTUS SOLOSTAR) 100 UNIT/ML Solostar Pen Inject 16 Units into the skin 2 (two) times daily. 30 mL 1   levETIRAcetam (KEPPRA) 250 MG tablet Take 1 tablet (250 mg total) by mouth in the morning, at noon, and at bedtime. 270 tablet 0   propranolol (INDERAL) 40 MG tablet Take 1 tablet (40 mg total) by mouth 2 (two) times daily. 180 tablet 3   Current Facility-Administered Medications on File Prior to Visit  Medication Dose Route Frequency Provider Last Rate Last Admin   regadenoson (LEXISCAN) injection SOLN 0.4 mg  0.4 mg Intravenous Once Jake Bathe, MD        BP 106/62   Pulse 77   Temp 98.1 F (36.7 C) (Oral)   Ht 5\' 3"  (1.6 m)   Wt 150 lb (68 kg)   SpO2 95%   BMI 26.57 kg/m       Objective:   Physical Exam Vitals and nursing note reviewed.  Constitutional:      Appearance: Normal appearance.  Cardiovascular:     Rate and Rhythm: Normal rate and regular rhythm.     Pulses: Normal pulses.     Heart sounds: Normal heart sounds.  Pulmonary:     Effort: Pulmonary effort is normal.     Breath sounds: Normal breath sounds.  Abdominal:     General: Abdomen is flat. Bowel sounds are normal.     Palpations: Abdomen is soft.  Skin:    General: Skin is warm and dry.     Capillary Refill: Capillary refill takes less than 2 seconds.  Neurological:  General: No focal deficit present.     Mental Status: She is alert and oriented to person, place, and time.  Psychiatric:        Mood and Affect: Mood normal.        Behavior: Behavior normal.        Thought Content: Thought content normal.        Judgment: Judgment normal.      Assessment & Plan:  1. Uncontrolled type 2 diabetes mellitus with hyperglycemia (HCC) - Needs to start taking medication as directed - start with 16 units BID  - She does not want to start Ozempic at this time  - Consider increasing Glipizide  - Follow up in 3 months  - Hemoglobin A1c;  Future - Basic Metabolic Panel; Future - Basic Metabolic Panel - Hemoglobin A1c  2. Essential hypertension - Controlled. No change in medications  - Hemoglobin A1c; Future - Basic Metabolic Panel; Future - Basic Metabolic Panel - Hemoglobin A1c  3. Dysuria  - Urinalysis; Future - Culture, Urine; Future - Culture, Urine - Urinalysis - Urinalysis, Routine w reflex microscopic  4. Fibromyalgia  - amitriptyline (ELAVIL) 25 MG tablet; Take 1 tablet (25 mg total) by mouth at bedtime.  Dispense: 90 tablet; Refill: 1  5. Intractable epilepsy without status epilepticus, unspecified epilepsy type (HCC) -Will have her take 500 mg in the morning and 500 mg in the evening  - Levetiracetam level; Future - Levetiracetam level  Shirline Frees, NP

## 2021-08-08 LAB — URINE CULTURE
MICRO NUMBER:: 12621070
SPECIMEN QUALITY:: ADEQUATE

## 2021-08-08 LAB — LEVETIRACETAM LEVEL: Levetiracetam Lvl: 33.4 ug/mL (ref 10.0–40.0)

## 2021-08-11 ENCOUNTER — Other Ambulatory Visit: Payer: Self-pay | Admitting: Adult Health

## 2021-08-11 MED ORDER — AMOXICILLIN-POT CLAVULANATE 875-125 MG PO TABS: 1.0000 | ORAL_TABLET | Freq: Two times a day (BID) | ORAL | 0 refills | Status: AC

## 2021-08-18 ENCOUNTER — Other Ambulatory Visit: Payer: Self-pay | Admitting: Adult Health

## 2021-08-18 DIAGNOSIS — R569 Unspecified convulsions: Secondary | ICD-10-CM

## 2021-08-19 MED ORDER — LEVETIRACETAM 250 MG PO TABS: 250.0000 mg | ORAL_TABLET | Freq: Three times a day (TID) | ORAL | 0 refills | Status: DC

## 2021-08-19 MED ORDER — RELION PEN NEEDLES 31G X 6 MM MISC
1.0000 | 2 refills | Status: DC
Start: 2021-08-19 — End: 2022-10-15

## 2021-08-27 ENCOUNTER — Telehealth: Payer: Self-pay | Admitting: Pharmacist

## 2021-08-27 NOTE — Chronic Care Management (AMB) (Signed)
Chronic Care Management Pharmacy Assistant   Name: [name]  MRN: [id number] DOB: 06/23/1956  Reason for Encounter: Disease State / Diabetes Assessment Call   Conditions to be addressed/monitored: DMII  Recent office visits:  08/06/2021 Shirline Frees NP - Patient was seen for Uncontrolled type 2 diabetes and additional issues. Change in Amitriptyline, cut the dose in half. Follow up in 3 months.  Recent consult visits:  None  Hospital visits:  None  Medications: Outpatient Encounter Medications as of 08/27/2021  Medication Sig   amitriptyline (ELAVIL) 25 MG tablet Take 1 tablet (25 mg total) by mouth at bedtime.   busPIRone (BUSPAR) 15 MG tablet TAKE 1 TABLET BY MOUTH THREE TIMES DAILY   Cholecalciferol (VITAMIN D3) 2000 units TABS Take 1 tablet by mouth daily.   cilostazol (PLETAL) 100 MG tablet Take 1/2 (one-half) tablet by mouth twice daily   Continuous Blood Gluc Receiver (FREESTYLE LIBRE 14 DAY READER) DEVI Use with libre system   Continuous Blood Gluc Sensor (FREESTYLE LIBRE 14 DAY SENSOR) MISC USE AS DIRECTED   cyclobenzaprine (FLEXERIL) 10 MG tablet Take 1 tablet (10 mg total) by mouth at bedtime.   FLUoxetine (PROZAC) 40 MG capsule Take 1 capsule by mouth once daily   glipiZIDE (GLUCOTROL) 5 MG tablet TAKE 1 TABLET BY MOUTH TWICE DAILY BEFORE MEAL(S)   hydrochlorothiazide (MICROZIDE) 12.5 MG capsule Take 1 capsule by mouth once daily   insulin glargine (LANTUS SOLOSTAR) 100 UNIT/ML Solostar Pen Inject 16 Units into the skin 2 (two) times daily.   Insulin Pen Needle (B-D ULTRAFINE III SHORT PEN) 31G X 8 MM MISC USE AS DIRECTED   isosorbide mononitrate (IMDUR) 60 MG 24 hr tablet Take 1.5 tablets (90 mg total) by mouth daily.   levETIRAcetam (KEPPRA) 250 MG tablet Take 1 tablet (250 mg total) by mouth in the morning, at noon, and at bedtime.   metFORMIN (GLUCOPHAGE) 1000 MG tablet TAKE 1 TABLET BY MOUTH TWICE DAILY WITH MEALS   nitroGLYCERIN (NITROSTAT) 0.4 MG SL  tablet Place 1 tablet (0.4 mg total) under the tongue every 5 (five) minutes x 3 doses as needed for chest pain.   nystatin cream (MYCOSTATIN) Apply 1 application topically 2 (two) times daily.   pravastatin (PRAVACHOL) 40 MG tablet TAKE 1 TABLET BY MOUTH AT BEDTIME   pregabalin (LYRICA) 100 MG capsule Take 1 capsule by mouth twice daily   propranolol (INDERAL) 40 MG tablet Take 1 tablet (40 mg total) by mouth 2 (two) times daily.   RELION PEN NEEDLES 31G X 6 MM MISC 1 each by Other route as directed.   Facility-Administered Encounter Medications as of 08/27/2021  Medication   regadenoson (LEXISCAN) injection SOLN 0.4 mg  Fill History: AMITRIPTYLIN 25MG    TAB 08/06/2021 90   CILOSTAZOL 100MG     TAB 05/29/2021 90   CYCLOBENZAPR 10MG    TAB 06/29/2021 90   FLUoxetine 40MG      CAP 05/29/2021 90   LANTUS SOLOSTAR     INJ 05/03/2021 94   PREGABALIN 100MG  CAP 06/30/2021 90   PROPRANOLOL 40MG     TAB 08/03/2021 90   BusPIRone 15MG       TAB 06/30/2021 90   GlipiZIDE 5MG        TAB 06/21/2021 90   HYDROCHLOROTHIAZIDE 12.5MG  CAP 05/29/2021 90   LEVETIRACETAM  250 MG TABS 08/25/2021 90   METFORMIN 1000MG  TAB 05/29/2021 90   Recent Relevant Labs: Lab Results  Component Value Date/Time   HGBA1C 8.8 (H) 08/06/2021 02:33 PM  HGBA1C 8.3 (A) 01/30/2021 02:40 PM   HGBA1C 8.3 (H) 10/30/2020 01:38 PM   MICROALBUR 1.4 04/05/2013 02:05 PM    Kidney Function Lab Results  Component Value Date/Time   CREATININE 1.81 (H) 08/06/2021 02:33 PM   CREATININE 0.89 10/30/2020 01:38 PM   CREATININE 0.9 03/12/2015 11:15 AM   GFR 29.43 (L) 08/06/2021 02:33 PM   GFRNONAA 54 (L) 08/21/2018 10:07 PM   GFRAA >60 08/21/2018 10:07 PM    Current antihyperglycemic regimen:  Glipizide 5 mg twice daily Lantus solostar 100un/ml, 16 units twice daily Metformin 1000 mg twice daily  What recent interventions/DTPs have been made to improve glycemic control:  She states she is working with Kandee Keen to find a way to  control her blood sugars  Have there been any recent hospitalizations or ED visits since last visit with CPP? No  Patient reports hypoglycemic symptoms, including  alarm on phone will go off just before reaching 70  Patient reports hyperglycemic symptoms, including blurry vision, excessive thirst, and fatigue  How often are you checking your blood sugar? 3-4 times daily  What are your blood sugars ranging?  Fasting: average 150 Patient didn't have her log available to look at  During the week, how often does your blood glucose drop below 70? This will have this happen on rare occasion, her phone alarm will let her know and she will eat something  Are you checking your feet daily/regularly? Yes  Adherence Review: Is the patient currently on a STATIN medication? Yes Is the patient currently on ACE/ARB medication? Yes Does the patient have >5 day gap between last estimated fill dates? No  Care Gaps: AWV - completed 02/19/2021 Covid 19 vaccine - never done Pneumovax - never done Hep C screen - never done Tetanus/TDAP vaccine - never done Zoster vaccine - never done Pap smear - never done Colonoscopy - never done Mammogram - never done Microalbumin urine - overdue Foot exam - overdue  Last BP - 106/62 on 08/06/2021 Last A1C - 8.8 on 08/06/2021  Star Rating Drugs: Glipizide 5mg  - last filled 06/21/2021 90DS at [facility]  Pravastatin 40mg  - last filled 06/29/2021 90DS at [facility] Metformin 1000 mg - last filled 05/29/2021 90 DS at 08/29/2021 - last filled 05/03/2021 94 DS at [facility]  [facility]  Clinical Pharmacist Assistant [phone]

## 2021-09-17 ENCOUNTER — Other Ambulatory Visit: Payer: Self-pay | Admitting: Adult Health

## 2021-09-17 DIAGNOSIS — I10 Essential (primary) hypertension: Secondary | ICD-10-CM

## 2021-10-28 ENCOUNTER — Telehealth: Payer: Self-pay | Admitting: Pharmacist

## 2021-10-28 NOTE — Chronic Care Management (AMB) (Signed)
Chronic Care Management Pharmacy Assistant   Name: [name]  MRN: [id number] DOB: August 02, 1956  Reason for Encounter: Disease State / Diabetes Assessment Call   Conditions to be addressed/monitored: DMII  Recent office visits:  None  Recent consult visits:  None  Hospital visits:  None  Medications: Outpatient Encounter Medications as of 10/28/2021  Medication Sig   amitriptyline (ELAVIL) 25 MG tablet Take 1 tablet (25 mg total) by mouth at bedtime.   busPIRone (BUSPAR) 15 MG tablet TAKE 1 TABLET BY MOUTH THREE TIMES DAILY   Cholecalciferol (VITAMIN D3) 2000 units TABS Take 1 tablet by mouth daily.   cilostazol (PLETAL) 100 MG tablet Take 1/2 (one-half) tablet by mouth twice daily   Continuous Blood Gluc Receiver (FREESTYLE LIBRE 14 DAY READER) DEVI Use with libre system   Continuous Blood Gluc Sensor (FREESTYLE LIBRE 14 DAY SENSOR) MISC USE AS DIRECTED   cyclobenzaprine (FLEXERIL) 10 MG tablet Take 1 tablet (10 mg total) by mouth at bedtime.   FLUoxetine (PROZAC) 40 MG capsule Take 1 capsule by mouth once daily   glipiZIDE (GLUCOTROL) 5 MG tablet TAKE 1 TABLET BY MOUTH TWICE DAILY BEFORE MEAL(S)   hydrochlorothiazide (MICROZIDE) 12.5 MG capsule Take 1 capsule by mouth once daily   insulin glargine (LANTUS SOLOSTAR) 100 UNIT/ML Solostar Pen Inject 16 Units into the skin 2 (two) times daily.   Insulin Pen Needle (B-D ULTRAFINE III SHORT PEN) 31G X 8 MM MISC USE AS DIRECTED   isosorbide mononitrate (IMDUR) 60 MG 24 hr tablet Take 1.5 tablets (90 mg total) by mouth daily.   levETIRAcetam (KEPPRA) 250 MG tablet Take 1 tablet (250 mg total) by mouth in the morning, at noon, and at bedtime.   metFORMIN (GLUCOPHAGE) 1000 MG tablet TAKE 1 TABLET BY MOUTH TWICE DAILY WITH MEALS   nitroGLYCERIN (NITROSTAT) 0.4 MG SL tablet Place 1 tablet (0.4 mg total) under the tongue every 5 (five) minutes x 3 doses as needed for chest pain.   nystatin cream (MYCOSTATIN) Apply 1 application  topically 2 (two) times daily.   pravastatin (PRAVACHOL) 40 MG tablet TAKE 1 TABLET BY MOUTH AT BEDTIME   pregabalin (LYRICA) 100 MG capsule Take 1 capsule by mouth twice daily   propranolol (INDERAL) 40 MG tablet Take 1 tablet (40 mg total) by mouth 2 (two) times daily.   RELION PEN NEEDLES 31G X 6 MM MISC 1 each by Other route as directed.   Facility-Administered Encounter Medications as of 10/28/2021  Medication   regadenoson (LEXISCAN) injection SOLN 0.4 mg  Fill History: AMITRIPTYLIN 25MG    TAB 08/06/2021 90   CYCLOBENZAPR 10MG    TAB 06/29/2021 90   FLUoxetine 40MG      CAP 05/29/2021 90   PRAVASTATIN 40MG     TAB 06/29/2021 90   PREGABALIN 100MG  CAP 06/30/2021 90   PROPRANOLOL 40MG     TAB 08/03/2021 90   BusPIRone 15MG       TAB 06/30/2021 90   GlipiZIDE 5MG        TAB 06/21/2021 90   HYDROCHLOROTHIAZIDE 12.5MG  CAP 05/29/2021 90   LEVETIRACETAM 250 MG TABLET 08/25/2021 90   METFORMIN 1000MG  TAB 05/29/2021 90   Recent Relevant Labs: Lab Results  Component Value Date/Time   HGBA1C 8.8 (H) 08/06/2021 02:33 PM   HGBA1C 8.3 (A) 01/30/2021 02:40 PM   HGBA1C 8.3 (H) 10/30/2020 01:38 PM   MICROALBUR 1.4 04/05/2013 02:05 PM    Kidney Function Lab Results  Component Value Date/Time   CREATININE 1.81 (H) 08/06/2021 02:33  PM   CREATININE 0.89 10/30/2020 01:38 PM   CREATININE 0.9 03/12/2015 11:15 AM   GFR 29.43 (L) 08/06/2021 02:33 PM   GFRNONAA 54 (L) 08/21/2018 10:07 PM   GFRAA >60 08/21/2018 10:07 PM    Current antihyperglycemic regimen:  Glipizide 5 mg twice daily Lantus solostar 100un/ml, 16 units twice daily Metformin 1000 mg twice daily  What recent interventions/DTPs have been made to improve glycemic control:  No recent interventions  Have there been any recent hospitalizations or ED visits since last visit with CPP? No recent hospital visits.  Patient reports hypoglycemic symptoms, including alarm on phone will go off just before reaching 70. Patient states  he blood sugars have dropped on occasion to the 40's, she will have symptoms of wooziness, dizzy and a intense headache in the front of her head.  She is able to reverse this quickly with food.   Patient reports hyperglycemic symptoms, including blurry vision, excessive thirst, fatigue, and weakness  How often are you checking your blood sugar? Patient states she is checking blood sugars 3-4 times daily  What are your blood sugars ranging?  Fasting: Patient states her average for the past 7 days was 171, the low being 133 and the high being 197.  During the week, how often does your blood glucose drop below 70? This will have this happen on rare occasion, a few times her blood sugar has dropped to the 40's, her phone alarm will let her know and she will eat something.  Are you checking your feet daily/regularly? Patient is checking feet regular  Adherence Review: Is the patient currently on a STATIN medication? Yes Is the patient currently on ACE/ARB medication? No Does the patient have >5 day gap between last estimated fill dates? Yes  Care Gaps: AWV - completed 02/19/2021 Last BP - 106/62 on 08/06/2021 Last A1C - 8.8 on 08/06/2021 Covid 19 vaccine - never done Pneumovax - never done Hep C screen - never done Tetanus/TDAP vaccine - never done Zoster vaccine - never done Pap smear - never done Colonoscopy - never done Mammogram - never done Microalbumin urine - overdue Foot exam - overdue   Star Rating Drugs: Glipizide 5mg  - last filled 09/18/2021 90DS at [facility]  Pravastatin 40mg  - last filled 06/29/2021 90DS at [facility] Metformin 1000 mg - last filled 09/18/2021 90 DS at 08/29/2021 - last filled 05/03/2021 94 DS at [facility] Verified fill dates with [employer] [facility]  Clinical Pharmacist Assistant [phone]

## 2021-11-06 ENCOUNTER — Ambulatory Visit (INDEPENDENT_AMBULATORY_CARE_PROVIDER_SITE_OTHER): Payer: Medicare Other | Admitting: Adult Health

## 2021-11-06 ENCOUNTER — Encounter: Payer: Self-pay | Admitting: Adult Health

## 2021-11-06 VITALS — BP 108/64 | HR 66 | Temp 97.7°F | Ht 63.0 in | Wt 150.0 lb

## 2021-11-06 DIAGNOSIS — I10 Essential (primary) hypertension: Secondary | ICD-10-CM | POA: Diagnosis not present

## 2021-11-06 DIAGNOSIS — E118 Type 2 diabetes mellitus with unspecified complications: Secondary | ICD-10-CM

## 2021-11-06 DIAGNOSIS — E782 Mixed hyperlipidemia: Secondary | ICD-10-CM

## 2021-11-06 DIAGNOSIS — I2119 ST elevation (STEMI) myocardial infarction involving other coronary artery of inferior wall: Secondary | ICD-10-CM

## 2021-11-06 DIAGNOSIS — F32A Depression, unspecified: Secondary | ICD-10-CM

## 2021-11-06 DIAGNOSIS — E1165 Type 2 diabetes mellitus with hyperglycemia: Secondary | ICD-10-CM

## 2021-11-06 DIAGNOSIS — R300 Dysuria: Secondary | ICD-10-CM | POA: Diagnosis not present

## 2021-11-06 DIAGNOSIS — R682 Dry mouth, unspecified: Secondary | ICD-10-CM

## 2021-11-06 DIAGNOSIS — J0141 Acute recurrent pansinusitis: Secondary | ICD-10-CM

## 2021-11-06 LAB — POCT GLYCOSYLATED HEMOGLOBIN (HGB A1C): Hemoglobin A1C: 7.4 % — AB (ref 4.0–5.6)

## 2021-11-06 MED ORDER — OZEMPIC (0.25 OR 0.5 MG/DOSE) 2 MG/1.5ML ~~LOC~~ SOPN: 0.2500 mg | PEN_INJECTOR | SUBCUTANEOUS | 0 refills | Status: DC

## 2021-11-06 MED ORDER — PRAVASTATIN SODIUM 40 MG PO TABS: 40.0000 mg | ORAL_TABLET | Freq: Every day | ORAL | 0 refills | Status: DC

## 2021-11-06 MED ORDER — ISOSORBIDE MONONITRATE ER 60 MG PO TB24: 90.0000 mg | ORAL_TABLET | Freq: Every day | ORAL | 3 refills | Status: DC

## 2021-11-06 MED ORDER — AMOXICILLIN-POT CLAVULANATE 875-125 MG PO TABS: 1.0000 | ORAL_TABLET | Freq: Two times a day (BID) | ORAL | 0 refills | Status: DC
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
## 2021-11-08 LAB — URINE CULTURE
MICRO NUMBER:: 12993443
SPECIMEN QUALITY:: ADEQUATE

## 2021-11-08 LAB — TIQ-MISC

## 2021-11-10 MED ORDER — PILOCARPINE HCL 5 MG PO TABS
5.0000 mg | ORAL_TABLET | Freq: Three times a day (TID) | ORAL | 0 refills | Status: AC
Start: 2021-11-10 — End: 2021-12-10

## 2021-11-17 ENCOUNTER — Telehealth: Payer: Self-pay | Admitting: Pharmacist

## 2021-11-17 NOTE — Chronic Care Management (AMB) (Signed)
° ° °  Chronic Care Management Pharmacy Assistant   Name: [name]  MRN: [id number] DOB: Jul 14, 1956  Reason for Encounter: Reschedule appointment with Gaylord Shih.  Unable to reach patient after 3 attempts. Left her a message to let her know we are canceling her appointment on 01/18/2022 with Gaylord Shih and to call me when she can, to reschedule this appointment.   [name] [facility]  Clinical Pharmacist Assistant [phone]

## 2021-11-24 ENCOUNTER — Other Ambulatory Visit: Payer: Self-pay | Admitting: Adult Health

## 2021-11-24 DIAGNOSIS — R569 Unspecified convulsions: Secondary | ICD-10-CM

## 2021-12-21 ENCOUNTER — Other Ambulatory Visit: Payer: Self-pay | Admitting: Adult Health

## 2021-12-21 ENCOUNTER — Other Ambulatory Visit: Payer: Self-pay | Admitting: Internal Medicine

## 2021-12-21 DIAGNOSIS — M797 Fibromyalgia: Secondary | ICD-10-CM

## 2021-12-21 DIAGNOSIS — I10 Essential (primary) hypertension: Secondary | ICD-10-CM

## 2021-12-21 DIAGNOSIS — E1165 Type 2 diabetes mellitus with hyperglycemia: Secondary | ICD-10-CM

## 2021-12-25 ENCOUNTER — Telehealth: Payer: Self-pay | Admitting: Pharmacist

## 2021-12-25 NOTE — Chronic Care Management (AMB) (Signed)
? ? ?Chronic Care Management ?Pharmacy Assistant  ? ?Name: [name]  MRN: [id number] DOB: [birth year] ? ?Reason for Encounter: Disease State / Diabetes Assessment Call ?  ?Conditions to be addressed/monitored: ?DMII ? ?Recent office visits:  ?11/06/2021 Shirline Frees NP - Patient was seen for Type 2 diabetes mellitus with complication, without long-term current use of insulin and additional issues. Started Augmentin 875/125 mg twice daily, Pilocarpine 5 mg three times daily and Semaglutide 0.25 mg weekly. No follow up noted.  ? ?Recent consult visits:  ?None ? ?Hospital visits:  ?None ? ?Medications: ?Outpatient Encounter Medications as of 12/25/2021  ?Medication Sig  ? amitriptyline (ELAVIL) 25 MG tablet TAKE 1 TABLET BY MOUTH AT BEDTIME  ? B-D ULTRAFINE III SHORT PEN 31G X 8 MM MISC USE AS DIRECTED  ? Continuous Blood Gluc Sensor (FREESTYLE LIBRE 14 DAY SENSOR) MISC USE AS DIRECTED EVERY  14  DAYS  WITH  READER  ? FLUoxetine (PROZAC) 40 MG capsule Take 1 capsule by mouth once daily  ? hydrochlorothiazide (MICROZIDE) 12.5 MG capsule Take 1 capsule by mouth once daily  ? LANTUS SOLOSTAR 100 UNIT/ML Solostar Pen INJECT 16 UNITS SUBCUTANEOUSLY TWICE DAILY  ? metFORMIN (GLUCOPHAGE) 1000 MG tablet TAKE 1 TABLET BY MOUTH TWICE DAILY WITH MEALS  ? OZEMPIC, 0.25 OR 0.5 MG/DOSE, 2 MG/1.5ML SOPN INJECT 0.25MG  SUBCUTANEOUSLY ONCE A WEEK  ? amoxicillin-clavulanate (AUGMENTIN) 875-125 MG tablet Take 1 tablet by mouth 2 (two) times daily.  ? busPIRone (BUSPAR) 15 MG tablet TAKE 1 TABLET BY MOUTH THREE TIMES DAILY  ? Cholecalciferol (VITAMIN D3) 2000 units TABS Take 1 tablet by mouth daily.  ? cilostazol (PLETAL) 100 MG tablet Take 1/2 (one-half) tablet by mouth twice daily  ? Continuous Blood Gluc Receiver (FREESTYLE LIBRE 14 DAY READER) DEVI Use with libre system  ? cyclobenzaprine (FLEXERIL) 10 MG tablet Take 1 tablet (10 mg total) by mouth at bedtime.  ? glipiZIDE (GLUCOTROL) 5 MG tablet TAKE 1 TABLET BY MOUTH TWICE DAILY  BEFORE MEAL(S)  ? isosorbide mononitrate (IMDUR) 60 MG 24 hr tablet Take 1.5 tablets (90 mg total) by mouth daily.  ? levETIRAcetam (KEPPRA) 250 MG tablet TAKE 1 TABLET BY MOUTH ONCE DAILY IN THE MORNING, AT NOON, AND AT BEDTIME  ? nitroGLYCERIN (NITROSTAT) 0.4 MG SL tablet Place 1 tablet (0.4 mg total) under the tongue every 5 (five) minutes x 3 doses as needed for chest pain.  ? nystatin cream (MYCOSTATIN) Apply 1 application topically 2 (two) times daily.  ? pravastatin (PRAVACHOL) 40 MG tablet Take 1 tablet (40 mg total) by mouth at bedtime.  ? pregabalin (LYRICA) 100 MG capsule Take 1 capsule by mouth twice daily  ? propranolol (INDERAL) 40 MG tablet Take 1 tablet (40 mg total) by mouth 2 (two) times daily.  ? RELION PEN NEEDLES 31G X 6 MM MISC 1 each by Other route as directed.  ? ?No facility-administered encounter medications on file as of 12/25/2021.  ?Fill History: ?AMITRIPTYLIN 25MG    TAB 11/06/2021 90  ? ?CILOSTAZOL 100MG     TAB 12/23/2021 90  ? ?FLUOXETINE 40MG  CAP 12/22/2021 90  ? ?LANTUS SOLOSTAR     INJ 12/22/2021 46  ? ?ISOSORB MONO ER 60MG  TAB 11/06/2021 90  ? ?PILOCARPINE 5MG      TAB 11/10/2021 30  ? ?PRAVASTATIN 40MG     TAB 11/06/2021 90  ? ?PREGABALIN 100MG  CAP 10/19/2021 90  ? ?OZEMPIC (0.25&0.5) 2MG /3ML INJ 12/23/2021 28  ? ?BusPIRone 15MG       TAB 10/16/2021 90  ? ?  GlipiZIDE 5MG        TAB 09/18/2021 90  ? ?HYDROCHLOROTHIAZIDE 12.5MG  CAP 12/22/2021 90  ? ?LevETIRAcetam 250MG  TAB 11/25/2021 90  ? ?METFORMIN 1000MG  TAB 12/22/2021 90  ? ? ?Recent Relevant Labs: ?Lab Results  ?Component Value Date/Time  ? HGBA1C 7.4 (A) 11/06/2021 03:29 PM  ? HGBA1C 8.8 (H) 08/06/2021 02:33 PM  ? HGBA1C 8.3 (A) 01/30/2021 02:40 PM  ? HGBA1C 8.3 (H) 10/30/2020 01:38 PM  ? MICROALBUR 1.4 04/05/2013 02:05 PM  ?  ?Kidney Function ?Lab Results  ?Component Value Date/Time  ? CREATININE 1.81 (H) 08/06/2021 02:33 PM  ? CREATININE 0.89 10/30/2020 01:38 PM  ? CREATININE 0.9 03/12/2015 11:15 AM  ? GFR 29.43 (L) 08/06/2021  02:33 PM  ? GFRNONAA 54 (L) 08/21/2018 10:07 PM  ? GFRAA >60 08/21/2018 10:07 PM  ? ?Current antihyperglycemic regimen:  ?Glipizide 5 mg twice daily ?Lantus solostar 100un/ml, 16 units twice daily ?Metformin 1000 mg twice daily ?Ozempic 0.25 or 0.5mg  inject 0.25 mg once weekly ? ?What recent interventions/DTPs have been made to improve glycemic control: Ozempic was added at 11/06/2021 visit with 08/23/2018 NP. ? ? ?Have there been any recent hospitalizations or ED visits since last visit with CPP? No recent hospital visits. ? ?Patient hypoglycemic symptoms, including  ? ?Patient hyperglycemic symptoms, including ? ?How often are you checking your blood sugar?  ? ?What are your blood sugars ranging?  ?Fasting:  ?Before meals:  ?After meals:  ?Bedtime:  ? ?During the week, how often does your blood glucose drop below 70?  ? ?Are you checking your feet daily/regularly?  ? ?Adherence Review: ?Is the patient currently on a STATIN medication? Yes ?Is the patient currently on ACE/ARB medication? No ?Does the patient have >5 day gap between last estimated fill dates? No ? ? ?Unable to reach patient after several attempts.  ? ?Care Gaps: ?AWV - completed 02/19/2021 ?Last BP - 108/64 on 11/06/2021 ?Last A1C - 7.4 on 11/06/2021 ?Covid booster - never done ?Hep C screen - never done ?TDAP vaccine - never done ?Zoster vaccine - never done ?Pap smear - never done ?Colonoscopy - never done ?Mammogram - never done ?Malb - overdue ?Foot exam - overdue  ?  ?Star Rating Drugs: ?Glipizide 5mg  - last filled 09/18/2021 90DS at [facility]  ?Pravastatin 40mg  - last filled 11/06/2021 90DS at [facility] ?Metformin 1000 mg - last filled 12/22/2021 90 DS at [facility] ?Ozempic 2mg /1.62ml - last filled 12/23/2021 28 DS at [facility] ? ?12/24/2021 CMA  ?Clinical Pharmacist Assistant ?[phone] ? ?

## 2022-01-01 ENCOUNTER — Other Ambulatory Visit: Payer: Self-pay | Admitting: Adult Health

## 2022-01-01 DIAGNOSIS — M797 Fibromyalgia: Secondary | ICD-10-CM

## 2022-01-01 DIAGNOSIS — I10 Essential (primary) hypertension: Secondary | ICD-10-CM

## 2022-01-02 ENCOUNTER — Other Ambulatory Visit: Payer: Self-pay | Admitting: Adult Health

## 2022-01-02 DIAGNOSIS — E1165 Type 2 diabetes mellitus with hyperglycemia: Secondary | ICD-10-CM

## 2022-01-04 MED ORDER — PROPRANOLOL HCL 40 MG PO TABS: 40.0000 mg | ORAL_TABLET | Freq: Two times a day (BID) | ORAL | 1 refills | Status: DC

## 2022-01-04 MED ORDER — AMITRIPTYLINE HCL 25 MG PO TABS: 25.0000 mg | ORAL_TABLET | Freq: Every day | ORAL | 1 refills | Status: DC

## 2022-01-18 ENCOUNTER — Telehealth: Payer: Medicare Other

## 2022-01-22 ENCOUNTER — Telehealth: Payer: Self-pay | Admitting: Pharmacist

## 2022-01-22 NOTE — Chronic Care Management (AMB) (Signed)
? ? ?  Chronic Care Management ?Pharmacy Assistant  ? ?Name: [name]  MRN: [id number] DOB: 05-Jun-1956 ? ?01/25/2022 APPOINTMENT REMINDER ? ?Called Shrinika Adrian-Munro, No answer, left message of appointment on 01/25/2022 at 3:00 via telephone visit with Gaylord Shih, Pharm D. Notified to have all medications, supplements, blood pressure and/or blood sugar logs available during appointment and to return call if need to reschedule. ? ?Care Gaps: ?AWV - completed 02/19/2021 ?Last BP - 108/64 on 11/06/2021 ?Last A1C - 7.4 on 11/06/2021 ?Covid booster - never done ?Hep C screen - never done ?TDAP vaccine - never done ?Zoster vaccine - never done ?Pap smear - never done ?Colonoscopy - never done ?Mammogram - never done ?Malb - overdue ?Foot exam - overdue  ?  ?Star Rating Drugs: ?Glipizide 5mg  - last filled 09/18/2021 90DS at [facility] verified with [facility] ?Pravastatin 40mg  - last filled 11/06/2021 90DS at [facility] ?Metformin 1000 mg - last filled 12/22/2021 90 DS at [facility] ?Ozempic 2mg /1.79ml - last filled 12/23/2021 28 DS at [facility] ?  ?Any gaps in medications fill history? Yes ? ?4m CMA  ?Clinical Pharmacist Assistant ?[phone] ? ?

## 2022-01-25 ENCOUNTER — Ambulatory Visit (INDEPENDENT_AMBULATORY_CARE_PROVIDER_SITE_OTHER): Payer: Medicare Other | Admitting: Pharmacist

## 2022-01-25 DIAGNOSIS — E118 Type 2 diabetes mellitus with unspecified complications: Secondary | ICD-10-CM

## 2022-01-25 DIAGNOSIS — I10 Essential (primary) hypertension: Secondary | ICD-10-CM

## 2022-01-25 NOTE — Progress Notes (Signed)
? ?Chronic Care Management ?Pharmacy Note ? ?01/25/2022 ?Name:  [name] MRN:  [id number] DOB:  [birth year] ? ?Summary: ?Pt reports she stopped pilocarpine due to cost ?A1c is not at goal < 7% ?Pt stopped the glipizide on her own ? ?Recommendations/Changes made from today's visit: ?-Recommended spirometry for baseline breathing testing ?-Recommended restarting aspirin 81 mg daily ?-Recommend taper of amitriptyline due to dry mouth ?-Recommend stopping Lantus and increasing Ozempic to 0.5 mg weekly ?-Recommend switching to Dexcom G7 for ease of use ? ?Plan: ?Follow up with PCP in 4 weeks ?Follow up in 4 months ? ?Subjective: ?[name] is an 66 y.o. year old female who is a primary patient of [name], Tommi Rumps, NP.  The CCM team was consulted for assistance with disease management and care coordination needs.   ? ?Engaged with patient by telephone for follow up visit in response to provider referral for pharmacy case management and/or care coordination services.  ? ?Consent to Services:  ?The patient was given information about Chronic Care Management services, agreed to services, and gave verbal consent prior to initiation of services.  Please see initial visit note for detailed documentation.  ? ?Patient Care Team: ?[name], NP as PCP - General (Family Medicine) ?Minus Breeding, MD as PCP - Cardiology (Cardiology) ?Nobie Putnam, MD (Hematology and Oncology) ?Althisar, Peggyann Juba as Referring Physician (Physician Assistant) ?Kathrynn Ducking, MD (Inactive) as Consulting Physician (Neurology) ?Viona Gilmore, [facility] as Pharmacist (Pharmacist) ? ?Recent office visits: ?11/06/2021 [name] NP - Patient was seen for Type 2 diabetes mellitus with complication, without long-term current use of insulin and additional issues. Started Augmentin 875/125 mg twice daily, Pilocarpine 5 mg three times daily and Semaglutide 0.25 mg weekly. Referred to Psychiatry. Recommended follow up in 1 month.  ? ?08/06/2021  [name] NP - Patient was seen for Uncontrolled type 2 diabetes and additional issues. Change in Amitriptyline, cut the dose in half. Follow up in 3 months. ? ?Recent consult visits: ?12/24/2020 Minus Breeding MD (Cardiology) - Patient was seen for Coronary artery disease and 3 other issues.  Discontinued Fluticasone. Referred to Pulmonology. Follow up in 12 months. ? ?Hospital visits: ?None in previous 6 months ? ? ?Objective: ? ?Lab Results  ?Component Value Date  ? CREATININE 1.81 (H) 08/06/2021  ? BUN 24 (H) 08/06/2021  ? GFR 29.43 (L) 08/06/2021  ? GFRNONAA 54 (L) 08/21/2018  ? GFRAA >60 08/21/2018  ? NA 138 08/06/2021  ? K 4.7 08/06/2021  ? CALCIUM 10.2 08/06/2021  ? CO2 26 08/06/2021  ? GLUCOSE 199 (H) 08/06/2021  ? ? ?Lab Results  ?Component Value Date/Time  ? HGBA1C 7.4 (A) 11/06/2021 03:29 PM  ? HGBA1C 8.8 (H) 08/06/2021 02:33 PM  ? HGBA1C 8.3 (A) 01/30/2021 02:40 PM  ? HGBA1C 8.3 (H) 10/30/2020 01:38 PM  ? GFR 29.43 (L) 08/06/2021 02:33 PM  ? GFR 69.36 10/30/2020 01:38 PM  ? MICROALBUR 1.4 04/05/2013 02:05 PM  ?  ?Last diabetic Eye exam:  ?Lab Results  ?Component Value Date/Time  ? HMDIABEYEEXA no DR 05/11/2013 12:00 AM  ?  ?Last diabetic Foot exam: No results found for: HMDIABFOOTEX  ? ?Lab Results  ?Component Value Date  ? CHOL 146 10/30/2020  ? HDL 44.30 10/30/2020  ? [place] 69 10/30/2020  ? LDLDIRECT 131.1 05/22/2014  ? TRIG 167.0 (H) 10/30/2020  ? CHOLHDL 3 10/30/2020  ? ? ? ?  Latest Ref Rng & Units 10/30/2020  ?  1:38 PM 04/08/2020  ?  1:58  PM 03/12/2015  ? 11:15 AM  ?Hepatic Function  ?Total Protein 6.0 - 8.3 g/dL 6.6   6.6   7.5    ?Albumin 3.5 - 5.2 g/dL 3.8   4.0   4.1    ?AST 0 - 37 U/L 15   10   19     ?ALT 0 - 35 U/L 15   8   23     ?Alk Phosphatase 39 - 117 U/L 88   51   97    ?Total Bilirubin 0.2 - 1.2 mg/dL 0.2   0.2   0.26    ? ? ?Lab Results  ?Component Value Date/Time  ? TSH 2.33 10/30/2020 01:38 PM  ? TSH 1.43 04/08/2020 01:58 PM  ? ? ? ?  Latest Ref Rng & Units 10/30/2020  ?  1:38 PM  04/08/2020  ?  1:58 PM 08/21/2018  ? 10:07 PM  ?CBC  ?WBC 4.0 - 10.5 K/uL 15.4   12.1   25.9    ?Hemoglobin 12.0 - 15.0 g/dL 14.1   13.7   18.4    ?Hematocrit 36.0 - 46.0 % 42.4   41.5   55.6    ?Platelets 150.0 - 400.0 K/uL 319.0   318.0   333    ? ? ?No results found for: VD25OH ? ?Clinical ASCVD: Yes  ?The ASCVD Risk score (Arnett DK, et al., 2019) failed to calculate for the following reasons: ?  The patient has a prior MI or stroke diagnosis   ? ? ?  11/06/2021  ?  3:25 PM 02/19/2021  ?  2:45 PM 07/17/2015  ?  2:43 PM  ?Depression screen PHQ 2/9  ?Decreased Interest 3 0 3  ?Down, Depressed, Hopeless 3 0 3  ?PHQ - 2 Score 6 0 6  ?Altered sleeping 3  3  ?Tired, decreased energy 3  3  ?Change in appetite 3  3  ?Feeling bad or failure about yourself  3  3  ?Trouble concentrating 3  3  ?Moving slowly or fidgety/restless 2  3  ?Suicidal thoughts 0  3  ?PHQ-9 Score 23  27  ?Difficult doing work/chores Somewhat difficult    ?  ? ? ?Social History  ? ?Tobacco Use  ?Smoking Status Every Day  ? Packs/day: 1.00  ? Years: 43.00  ? Pack years: 43.00  ? Types: Cigarettes  ?Smokeless Tobacco Never  ? ?BP Readings from Last 3 Encounters:  ?11/06/21 108/64  ?08/06/21 106/62  ?01/30/21 112/62  ? ?Pulse Readings from Last 3 Encounters:  ?11/06/21 66  ?08/06/21 77  ?01/30/21 74  ? ?Wt Readings from Last 3 Encounters:  ?11/06/21 150 lb (68 kg)  ?08/06/21 150 lb (68 kg)  ?01/30/21 159 lb 3.2 oz (72.2 kg)  ? ?BMI Readings from Last 3 Encounters:  ?11/06/21 26.57 kg/m?  ?08/06/21 26.57 kg/m?  ?01/30/21 28.20 kg/m?  ? ? ?Assessment/Interventions: Review of patient past medical history, allergies, medications, health status, including review of consultants reports, laboratory and other test data, was performed as part of comprehensive evaluation and provision of chronic care management services.  ? ?SDOH:  (Social Determinants of Health) assessments and interventions performed: No ? ? ?SDOH Screenings  ? ?Alcohol Screen: Low Risk   ? Last  Alcohol Screening Score (AUDIT): 0  ?Depression (PHQ2-9): Medium Risk  ? PHQ-2 Score: 23  ?Financial Resource Strain: Medium Risk  ? Difficulty of Paying Living Expenses: Somewhat hard  ?Food Insecurity: No Food Insecurity  ? Worried About Charity fundraiser  in the Last Year: Never true  ? Ran Out of Food in the Last Year: Never true  ?Housing: Low Risk   ? Last Housing Risk Score: 0  ?Physical Activity: Inactive  ? Days of Exercise per Week: 0 days  ? Minutes of Exercise per Session: 0 min  ?Social Connections: Moderately Isolated  ? Frequency of Communication with Friends and Family: Twice a week  ? Frequency of Social Gatherings with Friends and Family: Twice a week  ? Attends Religious Services: Never  ? Active Member of Clubs or Organizations: No  ? Attends Archivist Meetings: Never  ? Marital Status: Married  ?Stress: Not on file  ?Tobacco Use: High Risk  ? Smoking Tobacco Use: Every Day  ? Smokeless Tobacco Use: Never  ? Passive Exposure: Not on file  ?Transportation Needs: No Transportation Needs  ? Lack of Transportation (Medical): No  ? Lack of Transportation (Non-Medical): No  ? ? ?CCM Care Plan ? ?Allergies  ?Allergen Reactions  ? Erythromycin Other (See Comments)  ? Statins   ?  SEVERE MUSCLE PAIN - failed lipitor, Livalo, red yeast rice (lovastatin), and she thinks Zocor and pravastatin  ? Ace Inhibitors Cough  ?  Patient refuses to take meds  ? Cymbalta [Duloxetine Hcl]   ?  Drowsiness  ? ? ?Medications Reviewed Today   ? ? Reviewed by Viona Gilmore, [place] (Pharmacist) on 01/25/22 at 1705  Med List Status: <None>  ? ?Medication Order Taking? Sig Documenting Provider Last Dose Status Informant  ?amitriptyline (ELAVIL) 25 MG tablet [id number]  Take 1 tablet (25 mg total) by mouth at bedtime. [name], NP  Active   ?B-D ULTRAFINE III SHORT PEN 31G X 8 MM MISC [id number]  USE AS DIRECTED [name], Tommi Rumps, NP  Active   ?busPIRone (BUSPAR) 15 MG tablet [id number]  Take 1 tablet (15 mg total)  by mouth 3 (three) times daily. [name], NP  Active   ?Cholecalciferol (VITAMIN D3) 2000 units TABS [id number]  Take 1 tablet by mouth daily. [provider]  Active   ?cilostazol (PLETAL

## 2022-01-25 NOTE — Patient Instructions (Signed)
Hi Clyda, ? ?It was great to catch up again! Please restart the aspirin 81 mg once daily as we discussed because this is a preventative for heart events and strokes. ? ?Please reach out to me if you have any questions or need anything before I reach back out! ? ?Best, ?Maddie ? ?Jeni Salles, PharmD, BCACP ?Clinical Pharmacist ?Therapist, [facility] at [place] ?[phone] ? ? Visit Information ? ? Goals Addressed   ?None ?  ? ?Patient Care Plan: [place]  ?  ? ?Problem Identified: Problem: Hypertension, Hyperlipidemia, Diabetes, Coronary Artery Disease, COPD, Depression, Tobacco use, and Fibromyalgia and Seizures   ?  ? ?Long-Range Goal: Patient-Specific Goal   ?Start Date: 07/21/2021  ?Expected End Date: 07/21/2022  ?Recent Progress: On track  ?Priority: High  ?Note:   ?Current Barriers:  ?Unable to independently afford treatment regimen ?Unable to independently monitor therapeutic efficacy ?Unable to achieve control of diabetes  ? ?Pharmacist Clinical Goal(s):  ?Patient will verbalize ability to afford treatment regimen ?achieve adherence to monitoring guidelines and medication adherence to achieve therapeutic efficacy ?achieve control of diabetes as evidenced by A1c  through collaboration with PharmD and provider.  ? ?Interventions: ?1:1 collaboration with [name], NP regarding development and update of comprehensive plan of care as evidenced by provider attestation and co-signature ?Inter-disciplinary care team collaboration (see longitudinal plan of care) ?Comprehensive medication review performed; medication list updated in electronic medical record ? ?Hypertension (BP goal <130/80) ?-Not ideally controlled ?-Current treatment: ?Hydrochlorothiazide 12.5 mg 1 capsule daily - Appropriate, Effective, Safe, Accessible ?Propranolol 40 mg 1 tablet twice daily - Appropriate, Effective, Safe, Accessible ?-Medications previously tried: none  ?-Current home readings: usually <140/<90 (checks 4  times a week) ?-Current dietary habits: doesn't add salt and only puts it on eggs; not eating out much ?-Current exercise habits: none ?-Denies hypotensive/hypertensive symptoms ?-Educated on BP goals and benefits of medications for prevention of heart attack, stroke and kidney damage; ?Importance of home blood pressure monitoring; ?Proper BP monitoring technique; ?-Counseled to monitor BP at home weekly, document, and provide log at future appointments ?-Counseled on diet and exercise extensively ?Recommended to continue current medication ? ?Hyperlipidemia: (LDL goal < 70) ?-Controlled ?-Current treatment: ?Pravastatin 40 mg 1 tablet at bedtime - Appropriate, Effective, Safe, Accessible ?-Medications previously tried: none  ?-Current dietary patterns: doesn't eat out much ?-Current exercise habits: none ?-Educated on Cholesterol goals;  ?Benefits of statin for ASCVD risk reduction; ?Importance of limiting foods high in cholesterol; ?-Counseled on diet and exercise extensively ?Recommended to continue current medication ? ?PAD (Goal: prevent heart events) ?-Not ideally controlled ?-Current treatment  ?Cilostazol 100 mg 1/2 tablet twice daily - Appropriate, Effective, Safe, Accessible ?Pravastatin 40 mg 1 tablet at bedtime - Appropriate, Effective, Safe, Accessible ?-Medications previously tried: aspirin  ?-Recommended to restart taking aspirin. ? ?CAD (Goal: prevent heart events) ?-Not ideally controlled ?-Current treatment  ?Pravastatin 40 mg 1 tablet at bedtime - Appropriate, Effective, Safe, Accessible ?Isosorbide mononitrate 60 mg 1.5 tablets daily ?Nitroglycerin 0.4 mg 1 tablet as needed ?-Medications previously tried: aspirin (unknown) ?-Recommended to restart aspirin. ? ?Diabetes (A1c goal <7%) ?-Uncontrolled ?-Current medications: ?Metformin 1000 mg 1 tablet twice daily - Appropriate, Query effective, Safe, Accessible ?Lantus 16 units twice daily - 9 in the morning and 9 at night - Appropriate, Query  effective, Query Safe, Accessible ?Ozempic 0.25 mg inject once weekly - Appropriate, Effective, Safe, Accessible ?-Medications previously tried: n/a  ?-Current home glucose readings ?fasting glucose: using Freestyle libre (pt is having  lows in the afternoon time) < 40 sometimes during the night ?post prandial glucose: n/a ?-Reports hypoglycemic/hyperglycemic symptoms ?-Current meal patterns:  ?breakfast: not eating much  ?lunch: sandwich or feeling sick  ?dinner: vegetable and meat ?snacks: n/a ?drinks: n/a ?-Current exercise: none ?-Educated on A1c and blood sugar goals; ?Prevention and management of hypoglycemic episodes; ?Benefits of routine self-monitoring of blood sugar; ?Carbohydrate counting and/or plate method ?-Counseled to check feet daily and get yearly eye exams ?-Counseled on diet and exercise extensively ?Recommended increasing Ozempic to 0.5 mg and stopping insulin. ?Collaborated with PCP to switch patient to Dexcom. ? ?Depression/Anxiety (Goal: minimize symptoms) ?-Controlled ?-Current treatment: ?Buspirone 15 mg 1 tablet three times daily - Appropriate, Effective, Safe, Accessible ?Fluoxetine 40 mg 1 capsule daily - Appropriate, Effective, Safe, Accessible ?Amitriptyline 25 mg 1 tablet daily - Appropriate, Query effective, Safe, Accessible ?-Medications previously tried/failed: unknown ?-PHQ9: 0 ?-GAD7: n/a ?-Educated on Benefits of medication for symptom control ?Benefits of cognitive-behavioral therapy with or without medication ?-Recommended to continue current medication ?Recommended tapering off amitriptyline. ? ?Tobacco use (Goal quit smoking) ?-Uncontrolled ?-Previous quit attempts: Zyban (sick), Chantix (heart palpitations), therapy, hypnosis ?-Current treatment  ?No medications ?-Patient smokes Within 30 minutes of waking ?-Patient triggers include: stress and habit ?-On a scale of 1-10, reports MOTIVATION to quit is 4 ?-On a scale of 1-10, reports CONFIDENCE in quitting is 3 ?-Provided  contact information for [place] [facility]uit [facility] (1-800-QUIT-NOW) and encouraged patient to reach out to this group for support. ?-Counseled on benefits of smoking cessation. ? ?Fibromyalgia (Goal: minimize pain) ?-Not ideally controlled ?-Current treatment  ?Pregabalin 100 mg 1 capsule twice daily - Appropriate, Query effective, Safe, Accessible ?Amitriptyline 25 mg 1 tablet at bedtime - Appropriate, Query effective, Safe, Accessible ?-Medications previously tried: none  ?-Recommended to continue current medication ? ?Seizures (Goal: prevent seizures) ?-Not ideally controlled ?-Current treatment  ?Levetiracetam 250 mg 1 tablet three times daily - taking 2 in the morning, 1 in daytime and 1 in the evening - Appropriate, Effective, Safe, Accessible ?-Medications previously tried: none  ?-Collaborated with PCP to clarify directions. ? ? ?Health Maintenance ?-Vaccine gaps: COVID, prevnar, tetanus, shingrix ?-Current therapy:  ?Cyclobenzaprine 10 mg at bedtime  ?Vitamin D 2000 units daily ?Nystatin cream as needed ?Melatonin 10 mg - at bedtime (years ago) ?-Educated on Cost vs benefit of each product must be carefully weighed by individual consumer ?-Patient is satisfied with current therapy and denies issues ?-Recommended decreasing melatonin to 5 mg per night. ? ?Patient Goals/Self-Care Activities ?Patient will:  ?- take medications as prescribed as evidenced by patient report and record review ?check glucose with CGM, document, and provide at future appointments ?engage in dietary modifications by increasing protein intake with each meal ? ?Follow Up Plan: The care management team will reach out to the patient again over the next 30 days.  ? ?  ?  ? ?Patient verbalizes understanding of instructions and care plan provided today and agrees to view in [place]. Active MyChart status confirmed with patient.   ?The pharmacy team will reach out to the patient again over the next 7 days.  ? ?Viona Gilmore, RPH  ?

## 2022-01-26 ENCOUNTER — Other Ambulatory Visit: Payer: Self-pay | Admitting: Adult Health

## 2022-01-26 DIAGNOSIS — E1165 Type 2 diabetes mellitus with hyperglycemia: Secondary | ICD-10-CM

## 2022-01-26 MED ORDER — DEXCOM G7 RECEIVER DEVI: 0 refills | Status: DC

## 2022-01-26 MED ORDER — DEXCOM G7 SENSOR MISC: 12 refills | Status: DC

## 2022-01-26 MED ORDER — OZEMPIC (0.25 OR 0.5 MG/DOSE) 2 MG/1.5ML ~~LOC~~ SOPN: 0.5000 mg | PEN_INJECTOR | SUBCUTANEOUS | 2 refills | Status: AC

## 2022-01-27 ENCOUNTER — Telehealth: Payer: Self-pay | Admitting: Pharmacist

## 2022-01-27 NOTE — Telephone Encounter (Signed)
Called patient about changes from CCM visit with diabetes medications. Left a voicemail requesting a call back. Will also send a mychart message to make sure patient is aware of the plan. ?

## 2022-01-27 NOTE — Chronic Care Management (AMB) (Signed)
    Chronic Care Management Pharmacy Assistant   Name: [name]  MRN: [id number] DOB: 08/21/1956  Reason for Encounter: Complete patient assistance application for Ozempic  Form completed to be mailed to patient.   [name] [facility]  Clinical Pharmacist Assistant [phone]

## 2022-02-08 ENCOUNTER — Other Ambulatory Visit: Payer: Self-pay | Admitting: Adult Health

## 2022-02-08 DIAGNOSIS — R569 Unspecified convulsions: Secondary | ICD-10-CM

## 2022-02-14 ENCOUNTER — Other Ambulatory Visit: Payer: Self-pay | Admitting: Adult Health

## 2022-02-14 DIAGNOSIS — E782 Mixed hyperlipidemia: Secondary | ICD-10-CM

## 2022-02-15 NOTE — Telephone Encounter (Signed)
Hi, I am covering for Maddie while she is out. I would recommend this patient start back with Lantus 10 units per day and then follow up with you or Maddie in a week or so. I did not want to make a clinical recommendation without checking with you - are you ok with this plan?

## 2022-02-23 ENCOUNTER — Ambulatory Visit (INDEPENDENT_AMBULATORY_CARE_PROVIDER_SITE_OTHER): Payer: Medicare Other

## 2022-02-23 ENCOUNTER — Ambulatory Visit: Payer: Medicare Other | Admitting: Adult Health

## 2022-02-23 VITALS — Ht 63.0 in | Wt 140.0 lb

## 2022-02-23 DIAGNOSIS — Z0000 Encounter for general adult medical examination without abnormal findings: Secondary | ICD-10-CM

## 2022-02-23 NOTE — Patient Instructions (Signed)
Ms. Trish Fountain , Thank you for taking time to complete your Medicare Wellness Visit. I appreciate your ongoing commitment to your health goals. Please review the following plan we discussed and let me know if I can assist you in the future.   Screening recommendations/referrals: Colonoscopy: Declined Mammogram: Declined Bone Density: Due at age 64 Recommended yearly ophthalmology/optometry visit for glaucoma screening and checkup Recommended yearly dental visit for hygiene and checkup  Vaccinations:declines all Influenza vaccine: recommend every Fall Pneumococcal vaccine: recommend once per lifetime Prevnar-20 Tdap vaccine: recommend every 10 years Shingles vaccine: recommend Shingrix which is 2 doses 2-6 months apart and over 90% effective    Covid-19: recommend 2 doses one month apart with a booster 6 months later   Advanced directives: May pick up packet at next office visit  Conditions/risks identified: See problem list  Next appointment: Follow up in one year for your annual wellness visit.   Preventive Care 40-64 Years, Female Preventive care refers to lifestyle choices and visits with your health care provider that can promote health and wellness. What does preventive care include? A yearly physical exam. This is also called an annual well check. Dental exams once or twice a year. Routine eye exams. Ask your health care provider how often you should have your eyes checked. Personal lifestyle choices, including: Daily care of your teeth and gums. Regular physical activity. Eating a healthy diet. Avoiding tobacco and drug use. Limiting alcohol use. Practicing safe sex. Taking low-dose aspirin daily starting at age 48. Taking vitamin and mineral supplements as recommended by your health care provider. What happens during an annual well check? The services and screenings done by your health care provider during your annual well check will depend on your age, overall  health, lifestyle risk factors, and family history of disease. Counseling  Your health care provider may ask you questions about your: Alcohol use. Tobacco use. Drug use. Emotional well-being. Home and relationship well-being. Sexual activity. Eating habits. Work and work Statistician. Method of birth control. Menstrual cycle. Pregnancy history. Screening  You may have the following tests or measurements: Height, weight, and BMI. Blood pressure. Lipid and cholesterol levels. These may be checked every 5 years, or more frequently if you are over 47 years old. Skin check. Lung cancer screening. You may have this screening every year starting at age 65 if you have a 30-pack-year history of smoking and currently smoke or have quit within the past 15 years. Fecal occult blood test (FOBT) of the stool. You may have this test every year starting at age 59. Flexible sigmoidoscopy or colonoscopy. You may have a sigmoidoscopy every 5 years or a colonoscopy every 10 years starting at age 30. Hepatitis C blood test. Hepatitis B blood test. Sexually transmitted disease (STD) testing. Diabetes screening. This is done by checking your blood sugar (glucose) after you have not eaten for a while (fasting). You may have this done every 1-3 years. Mammogram. This may be done every 1-2 years. Talk to your health care provider about when you should start having regular mammograms. This may depend on whether you have a family history of breast cancer. BRCA-related cancer screening. This may be done if you have a family history of breast, ovarian, tubal, or peritoneal cancers. Pelvic exam and Pap test. This may be done every 3 years starting at age 22. Starting at age 70, this may be done every 5 years if you have a Pap test in combination with an HPV test. Bone density scan.  This is done to screen for osteoporosis. You may have this scan if you are at high risk for osteoporosis. Discuss your test results,  treatment options, and if necessary, the need for more tests with your health care provider. Vaccines  Your health care provider may recommend certain vaccines, such as: Influenza vaccine. This is recommended every year. Tetanus, diphtheria, and acellular pertussis (Tdap, Td) vaccine. You may need a Td booster every 10 years. Zoster vaccine. You may need this after age 16. Pneumococcal 13-valent conjugate (PCV13) vaccine. You may need this if you have certain conditions and were not previously vaccinated. Pneumococcal polysaccharide (PPSV23) vaccine. You may need one or two doses if you smoke cigarettes or if you have certain conditions. Talk to your health care provider about which screenings and vaccines you need and how often you need them. This information is not intended to replace advice given to you by your health care provider. Make sure you discuss any questions you have with your health care provider. Document Released: 10/10/2015 Document Revised: 06/02/2016 Document Reviewed: 07/15/2015 Elsevier Interactive Patient Education  2017 [place] Prevention in the Home Falls can cause injuries. They can happen to people of all ages. There are many things you can do to make your home safe and to help prevent falls. What can I do on the outside of my home? Regularly fix the edges of walkways and driveways and fix any cracks. Remove anything that might make you trip as you walk through a door, such as a raised step or threshold. Trim any bushes or trees on the path to your home. Use bright outdoor lighting. Clear any walking paths of anything that might make someone trip, such as rocks or tools. Regularly check to see if handrails are loose or broken. Make sure that both sides of any steps have handrails. Any raised decks and porches should have guardrails on the edges. Have any leaves, snow, or ice cleared regularly. Use sand or salt on walking paths during winter. Clean  up any spills in your garage right away. This includes oil or grease spills. What can I do in the bathroom? Use night lights. Install grab bars by the toilet and in the tub and shower. Do not use towel bars as grab bars. Use non-skid mats or decals in the tub or shower. If you need to sit down in the shower, use a plastic, non-slip stool. Keep the floor dry. Clean up any water that spills on the floor as soon as it happens. Remove soap buildup in the tub or shower regularly. Attach bath mats securely with double-sided non-slip rug tape. Do not have throw rugs and other things on the floor that can make you trip. What can I do in the bedroom? Use night lights. Make sure that you have a light by your bed that is easy to reach. Do not use any sheets or blankets that are too big for your bed. They should not hang down onto the floor. Have a firm chair that has side arms. You can use this for support while you get dressed. Do not have throw rugs and other things on the floor that can make you trip. What can I do in the kitchen? Clean up any spills right away. Avoid walking on wet floors. Keep items that you use a lot in easy-to-reach places. If you need to reach something above you, use a strong step stool that has a grab bar. Keep electrical cords  out of the way. Do not use floor polish or wax that makes floors slippery. If you must use wax, use non-skid floor wax. Do not have throw rugs and other things on the floor that can make you trip. What can I do with my stairs? Do not leave any items on the stairs. Make sure that there are handrails on both sides of the stairs and use them. Fix handrails that are broken or loose. Make sure that handrails are as long as the stairways. Check any carpeting to make sure that it is firmly attached to the stairs. Fix any carpet that is loose or worn. Avoid having throw rugs at the top or bottom of the stairs. If you do have throw rugs, attach them to the  floor with carpet tape. Make sure that you have a light switch at the top of the stairs and the bottom of the stairs. If you do not have them, ask someone to add them for you. What else can I do to help prevent falls? Wear shoes that: Do not have high heels. Have rubber bottoms. Are comfortable and fit you well. Are closed at the toe. Do not wear sandals. If you use a stepladder: Make sure that it is fully opened. Do not climb a closed stepladder. Make sure that both sides of the stepladder are locked into place. Ask someone to hold it for you, if possible. Clearly mark and make sure that you can see: Any grab bars or handrails. First and last steps. Where the edge of each step is. Use tools that help you move around (mobility aids) if they are needed. These include: Canes. Walkers. Scooters. Crutches. Turn on the lights when you go into a dark area. Replace any light bulbs as soon as they burn out. Set up your furniture so you have a clear path. Avoid moving your furniture around. If any of your floors are uneven, fix them. If there are any pets around you, be aware of where they are. Review your medicines with your doctor. Some medicines can make you feel dizzy. This can increase your chance of falling. Ask your doctor what other things that you can do to help prevent falls. This information is not intended to replace advice given to you by your health care provider. Make sure you discuss any questions you have with your health care provider. Document Released: 07/10/2009 Document Revised: 02/19/2016 Document Reviewed: 10/18/2014 Elsevier Interactive Patient Education  2017 [employer].

## 2022-02-23 NOTE — Progress Notes (Unsigned)
Subjective:   Karletta Billey Goslingdrian-Munro is a 66 y.o. female who presents for Medicare Annual (Subsequent) preventive examination.  I connected with Particia today by telephone and verified that I am speaking with the correct person using two identifiers. Location patient: home Location provider: work Persons participating in the virtual visit: patient, Engineer, civil (consulting)nurse.    I discussed the limitations, risks, security and privacy concerns of performing an evaluation and management service by telephone and the availability of in person appointments. I also discussed with the patient that there may be a patient responsible charge related to this service. The patient expressed understanding and verbally consented to this telephonic visit.    Interactive audio and video telecommunications were attempted between this provider and patient, however failed, due to patient having technical difficulties OR patient did not have access to video capability.  We continued and completed visit with audio only.  Some vital signs may be absent or patient reported.   Time Spent with patient on telephone encounter: 30 minutes   Review of Systems     Cardiac Risk Factors include: diabetes mellitus;dyslipidemia;sedentary lifestyle;smoking/ tobacco exposure     Objective:    Today's Vitals   02/23/22 1451  Weight: 140 lb (63.5 kg)  Height: 5\' 3"  (1.6 m)   Body mass index is 24.8 kg/m.     02/23/2022    3:00 PM 08/21/2018    9:26 PM 03/12/2015   11:37 AM 02/07/2013    7:14 AM 07/07/2012    9:58 PM  Advanced Directives  Does Patient Have a Medical Advance Directive? No No No Patient does not have advance directive Patient would not like information  Would patient like information on creating a medical advance directive?   No - patient declined information    Pre-existing out of facility DNR order (yellow form or pink MOST form)    No No    Current Medications (verified) Outpatient Encounter Medications as of 02/23/2022   Medication Sig   B-D ULTRAFINE III SHORT PEN 31G X 8 MM MISC USE AS DIRECTED   busPIRone (BUSPAR) 15 MG tablet Take 1 tablet (15 mg total) by mouth 3 (three) times daily.   Cholecalciferol (VITAMIN D3) 2000 units TABS Take 1 tablet by mouth daily.   cilostazol (PLETAL) 100 MG tablet Take 1/2 (one-half) tablet by mouth twice daily   Continuous Blood Gluc Receiver (DEXCOM G7 RECEIVER) DEVI Use with Dexcom G7 sensory   Continuous Blood Gluc Sensor (DEXCOM G7 SENSOR) MISC Use one sensor every 10 days   cyclobenzaprine (FLEXERIL) 10 MG tablet Take 1 tablet (10 mg total) by mouth at bedtime.   FLUoxetine (PROZAC) 40 MG capsule Take 1 capsule by mouth once daily   hydrochlorothiazide (MICROZIDE) 12.5 MG capsule Take 1 capsule by mouth once daily   isosorbide mononitrate (IMDUR) 60 MG 24 hr tablet Take 1.5 tablets (90 mg total) by mouth daily.   LANTUS SOLOSTAR 100 UNIT/ML Solostar Pen INJECT 16 UNITS SUBCUTANEOUSLY TWICE DAILY   levETIRAcetam (KEPPRA) 250 MG tablet TAKE 1 TABLET BY MOUTH ONCE DAILY IN THE MORNING, AT NOON, AND AT BEDTIME   metFORMIN (GLUCOPHAGE) 1000 MG tablet TAKE 1 TABLET BY MOUTH TWICE DAILY WITH MEALS   nitroGLYCERIN (NITROSTAT) 0.4 MG SL tablet Place 1 tablet (0.4 mg total) under the tongue every 5 (five) minutes x 3 doses as needed for chest pain.   nystatin cream (MYCOSTATIN) Apply 1 application topically 2 (two) times daily.   pravastatin (PRAVACHOL) 40 MG tablet Take 1 tablet (40  mg total) by mouth at bedtime.   pregabalin (LYRICA) 100 MG capsule Take 1 capsule by mouth twice daily   propranolol (INDERAL) 40 MG tablet Take 1 tablet (40 mg total) by mouth 2 (two) times daily.   RELION PEN NEEDLES 31G X 6 MM MISC 1 each by Other route as directed.   Semaglutide,0.25 or 0.5MG /DOS, (OZEMPIC, 0.25 OR 0.5 MG/DOSE,) 2 MG/1.5ML SOPN Inject 0.5 mg into the skin once a week.   [DISCONTINUED] amitriptyline (ELAVIL) 25 MG tablet Take 1 tablet (25 mg total) by mouth at bedtime.    [DISCONTINUED] Continuous Blood Gluc Receiver (FREESTYLE LIBRE 14 DAY READER) DEVI Use with libre system   [DISCONTINUED] Continuous Blood Gluc Sensor (FREESTYLE LIBRE 14 DAY SENSOR) MISC USE AS DIRECTED EVERY  14  DAYS  WITH  READER   No facility-administered encounter medications on file as of 02/23/2022.    Allergies (verified) Erythromycin, Statins, Ace inhibitors, and Cymbalta [duloxetine hcl]   History: Past Medical History:  Diagnosis Date   Acute MI, inferior wall, initial episode of care (HCC) 07/08/2012   Cath-07/08/11 -distal RCA stent DES (99%), LAD 20-30%. EF 50% inferior hypokinesis  (infarct)   Anxiety    Asthma    Coronary artery disease    Depression    Diabetes mellitus    Fibromyalgia    Leukocytosis    Monocytosis    Peripheral arterial disease (HCC) 01/23/2013   Tobacco use disorder 07/08/2012   Past Surgical History:  Procedure Laterality Date   ABDOMINAL AORTAGRAM N/A 02/07/2013   Procedure: ABDOMINAL Ronny Flurry;  Surgeon: Iran Ouch, MD;  Location: [facility] CATH LAB;  Service: Cardiovascular;  Laterality: N/A;   CARDIAC CATHETERIZATION     CORONARY STENT PLACEMENT     CYST REMOVAL TRUNK  1977   tailbone   LEFT HEART CATHETERIZATION WITH CORONARY ANGIOGRAM N/A 07/07/2012   Procedure: LEFT HEART CATHETERIZATION WITH CORONARY ANGIOGRAM;  Surgeon: Kathleene Hazel, MD;  Location[facility] CATH LAB;  Service: Cardiovascular;  Laterality: N/A;   PERCUTANEOUS CORONARY STENT INTERVENTION (PCI-S)  07/07/2012   Procedure: PERCUTANEOUS CORONARY STENT INTERVENTION (PCI-S);  Surgeon: Kathleene Hazel, MD;  Location: [facility] CATH LAB;  Service: Cardiovascular;;   TONSILLECTOMY     Family History  Problem Relation Age of Onset   Kidney disease Mother    Diabetes Mother        DM Type II   Alzheimer's disease Mother    Diabetes Father        IDDM   Heart disease Father    Cancer Father        bone cancer died at 38   Cancer Sister        bone cancer at age 30    Multiple sclerosis Daughter    Diabetes Son 2       DM Type I   Heart disease Maternal Grandfather    Heart disease Paternal Grandmother    Diabetes Son 73       DM Type I   Social History   Socioeconomic History   Marital status: Married    Spouse name: Not on file   Number of children: 3   Years of education: college   Highest education level: Not on file  Occupational History   Not on file  Tobacco Use   Smoking status: Every Day    Packs/day: 1.00    Years: 43.00    Pack years: 43.00    Types: Cigarettes   Smokeless tobacco: Never  Substance and Sexual Activity   Alcohol use: No    Alcohol/week: 0.0 standard drinks    Comment: Rarely   Drug use: No   Sexual activity: Yes  Other Topics Concern   Not on file  Social History Narrative   Lives with husband, married for 20 years.      They have three grown chlildren.   She is not currently working and is on disability   Highest level of education:  Engineer, maintenance (IT)   Pets: two dogs and three rabbits.       Husband is a long Production assistant, radio.       Is unable to enjoy any activities because of body pains.       Social Determinants of Health   Financial Resource Strain: Medium Risk   Difficulty of Paying Living Expenses: Somewhat hard  Food Insecurity: No Food Insecurity   Worried About Programme researcher, broadcasting/film/video in the Last Year: Never true   Ran Out of Food in the Last Year: Never true  Transportation Needs: No Transportation Needs   Lack of Transportation (Medical): No   Lack of Transportation (Non-Medical): No  Physical Activity: Inactive   Days of Exercise per Week: 0 days   Minutes of Exercise per Session: 0 min  Stress: No Stress Concern Present   Feeling of Stress : Not at all  Social Connections: Socially Isolated   Frequency of Communication with Friends and Family: Once a week   Frequency of Social Gatherings with Friends and Family: Once a week   Attends Religious Services: Never   Doctor, general practice or Organizations: No   Attends Engineer, structural: Never   Marital Status: Married    Tobacco Counseling Ready to quit: Not Answered Counseling given: Not Answered   Clinical Intake:  Pre-visit preparation completed: Yes  Pain : No/denies pain     BMI - recorded: 24.8 Nutritional Status: BMI of 19-24  Normal Nutritional Risks: Unintentional weight loss (due to medication) Diabetes: Yes CBG done?: No Did pt. bring in CBG monitor from home?: No (phone visit)  How often do you need to have someone help you when you read instructions, pamphlets, or other written materials from your doctor or pharmacy?: 1 - Never  Diabetes:  Is the patient diabetic?  Yes  If diabetic, was a CBG obtained today?  No  Did the patient bring in their glucometer from home?  No  How often do you monitor your CBG's? Dexcom.   Financial Strains and Diabetes Management:  Are you having any financial strains with the device, your supplies or your medication? No .  Does the patient want to be seen by Chronic Care Management for management of their diabetes?  No  Would the patient like to be referred to a Nutritionist or for Diabetic Management?  No   Diabetic Exams:  Diabetic Eye Exam: . Overdue for diabetic eye exam. Pt has been advised about the importance in completing this exam.   Diabetic Foot Exam: Pt has been advised about the importance in completing this exam. To be completed by PCP.Marland Kitchen    Interpreter Needed?: No  Information entered by :: Thomasenia Sales LPN   Activities of Daily Living    02/23/2022    3:05 PM  In your present state of health, do you have any difficulty performing the following activities:  Hearing? 0  Vision? 0  Difficulty concentrating or making decisions? 1  Walking or climbing stairs? 1  Dressing or bathing? 1  Doing errands, shopping? 1  Preparing Food and eating ? Y  Using the Toilet? N  In the past six months, have you accidently leaked  urine? Y  Do you have problems with loss of bowel control? N  Managing your Medications? N  Managing your Finances? Y  Housekeeping or managing your Housekeeping? Y    Patient Care Team: Shirline Frees, NP as PCP - General (Family Medicine) Rollene Rotunda, MD as PCP - Cardiology (Cardiology) Exie Parody, MD (Hematology and Oncology) [place], Hanley Seamen as Referring Physician (Physician Assistant) York Spaniel, MD (Inactive) as Consulting Physician (Neurology) Verner Chol, [facility] as Pharmacist (Pharmacist)  Indicate any recent Medical Services you may have received from other than [facility] providers in the past year (date may be approximate).     Assessment:   This is a routine wellness examination for Jamice.  Hearing/Vision screen Hearing Screening - Comments:: No issues Vision Screening - Comments:: Last eye exam-about a year ago  Dietary issues and exercise activities discussed: Current Exercise Habits: The patient does not participate in regular exercise at present   Goals Addressed             This Visit's Progress    Quit Smoking         Depression Screen    02/23/2022    3:05 PM 11/06/2021    3:25 PM 02/19/2021    2:45 PM 07/17/2015    2:43 PM  PHQ 2/9 Scores  PHQ - 2 Score 1 6 0 6  PHQ- 9 Score  23  27    Fall Risk    02/23/2022    3:02 PM 11/06/2021    3:26 PM 02/19/2021    2:45 PM 10/30/2020    1:05 PM  Fall Risk   Falls in the past year? 1 1 0 1  Number falls in past yr: 1 1 0 1  Injury with Fall? 0 0 0 0  Risk for fall due to : History of fall(s)  Impaired balance/gait   Follow up Falls prevention discussed  Falls evaluation completed     FALL RISK PREVENTION PERTAINING TO THE HOME:  Any stairs in or around the home? No  Home free of loose throw rugs in walkways, pet beds, electrical cords, etc? Yes  Adequate lighting in your home to reduce risk of falls? Yes   ASSISTIVE DEVICES UTILIZED TO PREVENT FALLS:  Life alert? No  Use of a cane,  walker or w/c? Yes  Grab bars in the bathroom? Yes  Shower chair or bench in shower? Yes  Elevated toilet seat or a handicapped toilet? No   TIMED UP AND GO:  Was the test performed? No . Phone visit   Cognitive Function:Normal cognitive status assessed by this Nurse Health Advisor. No abnormalities found.          Immunizations  There is no immunization history on file for this patient.  TDAP status: Due, Education has been provided regarding the importance of this vaccine. Advised may receive this vaccine at local pharmacy or Health Dept. Aware to provide a copy of the vaccination record if obtained from local pharmacy or Health Dept. Verbalized acceptance and understanding.  Flu Vaccine status: Declined, Education has been provided regarding the importance of this vaccine but patient still declined. Advised may receive this vaccine at local pharmacy or Health Dept. Aware to provide a copy of the vaccination record if obtained from local pharmacy or Health Dept.  Verbalized acceptance and understanding.  Pneumococcal vaccine status: Due at age 72  Covid-19 vaccine status: Declined, Education has been provided regarding the importance of this vaccine but patient still declined. Advised may receive this vaccine at local pharmacy or Health Dept.or vaccine clinic. Aware to provide a copy of the vaccination record if obtained from local pharmacy or Health Dept. Verbalized acceptance and understanding.  Qualifies for Shingles Vaccine? Yes  patient declined   Screening Tests Health Maintenance  Topic Date Due   COVID-19 Vaccine (1) Never done   Hepatitis C Screening  Never done   PAP SMEAR-Modifier  Never done   URINE MICROALBUMIN  04/05/2014   OPHTHALMOLOGY EXAM  12/31/2021   Zoster Vaccines- Shingrix (1 of 2) 05/27/2022 (Originally 02/26/2008)   MAMMOGRAM  02/25/2023 (Originally 02/26/2008)   COLONOSCOPY (Pts 45-77yrs Insurance coverage will need to be confirmed)  02/25/2023  (Originally 02/26/2003)   TETANUS/TDAP  02/25/2023 (Originally 02/25/1977)   INFLUENZA VACCINE  09/05/2035 (Originally 04/27/2022)   HEMOGLOBIN A1C  05/06/2022   FOOT EXAM  02/25/2023   HIV Screening  Completed   HPV VACCINES  Aged Out    Health Maintenance  Health Maintenance Due  Topic Date Due   COVID-19 Vaccine (1) Never done   Hepatitis C Screening  Never done   PAP SMEAR-Modifier  Never done   URINE MICROALBUMIN  04/05/2014   OPHTHALMOLOGY EXAM  12/31/2021    Colorectal cancer screening: Due-Declined  Mammogram status: Due-Declined  Bine Density status: Due at age 45  Lung Cancer Screening: (Low Dose CT Chest recommended if Age 74-80 years, 30 pack-year currently smoking OR have quit w/in 15years.) does qualify.   Lung Cancer Screening Referral: Discuss with PCP  Additional Screening:  Hepatitis C Screening: does qualify; Discuss with PCP  Vision Screening: Recommended annual ophthalmology exams for early detection of glaucoma and other disorders of the eye. Is the patient up to date with their annual eye exam?  No  Who is the provider or what is the name of the office in which the patient attends annual eye exams? Patient states she is in the process of changing eye doctors.   Dental Screening: Recommended annual dental exams for proper oral hygiene  Community Resource Referral / Chronic Care Management: CRR required this visit?  No   CCM required this visit?  No      Plan:     I have personally reviewed and noted the following in the patient's chart:   Medical and social history Use of alcohol, tobacco or illicit drugs  Current medications and supplements including opioid prescriptions.  Functional ability and status Nutritional status Physical activity Advanced directives List of other physicians Hospitalizations, surgeries, and ER visits in previous 12 months Vitals Screenings to include cognitive, depression, and falls Referrals and  appointments  In addition, I have reviewed and discussed with patient certain preventive protocols, quality metrics, and best practice recommendations. A written personalized care plan for preventive services as well as general preventive health recommendations were provided to patient.   Due to this being a telephonic visit, the after visit summary with patients personalized plan was offered to patient via mail or my-chart. Patient would like to access on my-chart.   Roanna Raider, LPN   03/05/6294  Nurse Health Advisor  Nurse Notes: None

## 2022-02-24 ENCOUNTER — Encounter: Payer: Self-pay | Admitting: Adult Health

## 2022-02-24 ENCOUNTER — Ambulatory Visit (INDEPENDENT_AMBULATORY_CARE_PROVIDER_SITE_OTHER): Payer: Medicare Other | Admitting: Adult Health

## 2022-02-24 VITALS — BP 110/60 | HR 95 | Temp 97.9°F | Ht 63.0 in | Wt 142.0 lb

## 2022-02-24 DIAGNOSIS — M797 Fibromyalgia: Secondary | ICD-10-CM

## 2022-02-24 DIAGNOSIS — I10 Essential (primary) hypertension: Secondary | ICD-10-CM

## 2022-02-24 DIAGNOSIS — Z794 Long term (current) use of insulin: Secondary | ICD-10-CM | POA: Diagnosis not present

## 2022-02-24 DIAGNOSIS — F17200 Nicotine dependence, unspecified, uncomplicated: Secondary | ICD-10-CM

## 2022-02-24 DIAGNOSIS — I739 Peripheral vascular disease, unspecified: Secondary | ICD-10-CM | POA: Diagnosis not present

## 2022-02-24 DIAGNOSIS — F17210 Nicotine dependence, cigarettes, uncomplicated: Secondary | ICD-10-CM | POA: Diagnosis not present

## 2022-02-24 DIAGNOSIS — F32A Depression, unspecified: Secondary | ICD-10-CM

## 2022-02-24 DIAGNOSIS — E782 Mixed hyperlipidemia: Secondary | ICD-10-CM | POA: Diagnosis not present

## 2022-02-24 DIAGNOSIS — Z0000 Encounter for general adult medical examination without abnormal findings: Secondary | ICD-10-CM

## 2022-02-24 DIAGNOSIS — E785 Hyperlipidemia, unspecified: Secondary | ICD-10-CM | POA: Diagnosis not present

## 2022-02-24 DIAGNOSIS — E118 Type 2 diabetes mellitus with unspecified complications: Secondary | ICD-10-CM | POA: Diagnosis not present

## 2022-02-24 DIAGNOSIS — G40919 Epilepsy, unspecified, intractable, without status epilepticus: Secondary | ICD-10-CM

## 2022-02-24 DIAGNOSIS — I2510 Atherosclerotic heart disease of native coronary artery without angina pectoris: Secondary | ICD-10-CM | POA: Diagnosis not present

## 2022-02-24 DIAGNOSIS — E1159 Type 2 diabetes mellitus with other circulatory complications: Secondary | ICD-10-CM

## 2022-02-24 NOTE — Progress Notes (Signed)
Subjective:    Patient ID: [name], female    DOB: 1955-10-02, 66 y.o.   MRN: [id number]  HPI Patient presents for yearly preventative medicine examination. She is a pleasant 66 year old female who  has a past medical history of Acute MI, inferior wall, initial episode of care (HCC) (07/08/2012), Anxiety, Asthma, Coronary artery disease, Depression, Diabetes mellitus, Fibromyalgia, Leukocytosis, Monocytosis, Peripheral arterial disease (HCC) (01/23/2013), and Tobacco use disorder (07/08/2012).  DM Type 2 -currently managed with Ozempic 0.5 mg weekly, Lantus 8  units nightly and metformin 1000 mg twice daily.  She has been tolerating the Ozempic well.  Has been monitoring her blood sugars using the Dexcom system and reports readings in the 100-140 range. Lab Results  Component Value Date   HGBA1C 7.4 (A) 11/06/2021   Wt Readings from Last 3 Encounters:  02/24/22 142 lb (64.4 kg)  02/23/22 140 lb (63.5 kg)  11/06/21 150 lb (68 kg)   Hypertension -managed with propanolol 40 mg twice daily and HCTZ 12.5 mg daily.  She does check her blood pressures at home with readings below 140/90.  She denies dizziness, lightheadedness, chest pain, or shortness of breath BP Readings from Last 3 Encounters:  02/24/22 110/60  11/06/21 108/64  08/06/21 106/62   Hyperlipidemia -treatment includes pravastatin 40 mg at home.  She denies myalgia or fatigue Lab Results  Component Value Date   CHOL 146 10/30/2020   HDL 44.30 10/30/2020   LDLCALC 69 10/30/2020   LDLDIRECT 131.1 05/22/2014   TRIG 167.0 (H) 10/30/2020   CHOLHDL 3 10/30/2020   PAD -current treatment includes pravastatin 40 mg at bedtime aspirin 81 mg and Cilostazol 100 mg- 1/2 tablet twice daily had to stop this due to cost  CAD-  managed with pravastatin 40 mg at bedtime, isosorbide 60 mg 1.5 tablets daily and nitroglycerin 0.4 mg as needed  Depression -managed with BuSpar 10 mg 3 times daily Prozac 40 mg daily. She feels that her  depression is better controlled.     02/23/2022    3:05 PM 11/06/2021    3:25 PM 02/19/2021    2:45 PM  PHQ9 SCORE ONLY  PHQ-9 Total Score 1 23 0    Fibromyalgia -managed with Lyrica 100 mg twice daily  History of seizures-takes Keppra 250 mg 3 times daily.  She denies any recent seizure-like activity  Tobacco use - she continues to smoke. Does not want help quitting at this time. She is interested in lung cancer screening   All immunizations and health maintenance protocols were reviewed with the patient and needed orders were placed.Refuses vaccinations at this time    Appropriate screening laboratory values were ordered for the patient including screening of hyperlipidemia, renal function and hepatic function.  Medication reconciliation,  past medical history, social history, problem list and allergies were reviewed in detail with the patient  Goals were established with regard to weight loss, exercise, and  diet in compliance with medications  She refuses routine colon cancer screening ( with both colonoscopy and colo guard) mammogram, and PAP   She reports feeling much better than she has in the past.    Review of Systems  Constitutional:  Positive for fatigue.  HENT: Negative.    Eyes: Negative.   Respiratory: Negative.    Cardiovascular: Negative.   Gastrointestinal: Negative.   Endocrine: Negative.   Genitourinary: Negative.   Musculoskeletal:  Positive for arthralgias.  Skin: Negative.   Allergic/Immunologic: Negative.   Neurological:  Positive for weakness  and numbness.  Hematological: Negative.   Psychiatric/Behavioral:  Positive for decreased concentration, dysphoric mood and sleep disturbance.    Past Medical History:  Diagnosis Date   Acute MI, inferior wall, initial episode of care (HCC) 07/08/2012   Cath-07/08/11 -distal RCA stent DES (99%), LAD 20-30%. EF 50% inferior hypokinesis  (infarct)   Anxiety    Asthma    Coronary artery disease     Depression    Diabetes mellitus    Fibromyalgia    Leukocytosis    Monocytosis    Peripheral arterial disease (HCC) 01/23/2013   Tobacco use disorder 07/08/2012    Social History   Socioeconomic History   Marital status: Married    Spouse name: Not on file   Number of children: 3   Years of education: college   Highest education level: Not on file  Occupational History   Not on file  Tobacco Use   Smoking status: Every Day    Packs/day: 1.00    Years: 43.00    Pack years: 43.00    Types: Cigarettes   Smokeless tobacco: Never  Substance and Sexual Activity   Alcohol use: No    Alcohol/week: 0.0 standard drinks    Comment: Rarely   Drug use: No   Sexual activity: Yes  Other Topics Concern   Not on file  Social History Narrative   Lives with husband, married for 20 years.      They have three grown chlildren.   She is not currently working and is on disability   Highest level of education:  Engineer, maintenance (IT)   Pets: two dogs and three rabbits.       Husband is a long Production assistant, radio.       Is unable to enjoy any activities because of body pains.       Social Determinants of Health   Financial Resource Strain: Medium Risk   Difficulty of Paying Living Expenses: Somewhat hard  Food Insecurity: No Food Insecurity   Worried About Programme researcher, broadcasting/film/video in the Last Year: Never true   Ran Out of Food in the Last Year: Never true  Transportation Needs: No Transportation Needs   Lack of Transportation (Medical): No   Lack of Transportation (Non-Medical): No  Physical Activity: Inactive   Days of Exercise per Week: 0 days   Minutes of Exercise per Session: 0 min  Stress: No Stress Concern Present   Feeling of Stress : Not at all  Social Connections: Socially Isolated   Frequency of Communication with Friends and Family: Once a week   Frequency of Social Gatherings with Friends and Family: Once a week   Attends Religious Services: Never   Database administrator or  Organizations: No   Attends Banker Meetings: Never   Marital Status: Married  Catering manager Violence: Not on file    Past Surgical History:  Procedure Laterality Date   ABDOMINAL AORTAGRAM N/A 02/07/2013   Procedure: ABDOMINAL Ronny Flurry;  Surgeon: Iran Ouch, MD;  Location: [facility] CATH LAB;  Service: Cardiovascular;  Laterality: N/A;   CARDIAC CATHETERIZATION     CORONARY STENT PLACEMENT     CYST REMOVAL TRUNK  1977   tailbone   LEFT HEART CATHETERIZATION WITH CORONARY ANGIOGRAM N/A 07/07/2012   Procedure: LEFT HEART CATHETERIZATION WITH CORONARY ANGIOGRAM;  Surgeon: Kathleene Hazel, MD;  Location: [facility] CATH LAB;  Service: Cardiovascular;  Laterality: N/A;   PERCUTANEOUS CORONARY STENT INTERVENTION (PCI-S)  07/07/2012   Procedure:  PERCUTANEOUS CORONARY STENT INTERVENTION (PCI-S);  Surgeon: Kathleene Hazelhristopher D McAlhany, MD;  [facility];  Service: Cardiovascular;;   TONSILLECTOMY      Family History  Problem Relation Age of Onset   Kidney disease Mother    Diabetes Mother        DM Type II   Alzheimer's disease Mother    Diabetes Father        IDDM   Heart disease Father    Cancer Father        bone cancer died at 1852   Cancer Sister        bone cancer at age 64   Multiple sclerosis Daughter    Diabetes Son 2       DM Type I   Heart disease Maternal Grandfather    Heart disease Paternal Grandmother    Diabetes Son [id number]       DM Type I    Allergies  Allergen Reactions   Erythromycin Other (See Comments)   Statins     SEVERE MUSCLE PAIN - failed lipitor, Livalo, red yeast rice (lovastatin), and she thinks Zocor and pravastatin   Ace Inhibitors Cough    Patient refuses to take meds   Cymbalta [Duloxetine Hcl]     Drowsiness    Current Outpatient Medications on File Prior to Visit  Medication Sig Dispense Refill   B-D ULTRAFINE III SHORT PEN 31G X 8 MM MISC USE AS DIRECTED 100 each 0   busPIRone (BUSPAR) 15 MG tablet Take 1 tablet (15 mg  total) by mouth 3 (three) times daily. 270 tablet 0   Cholecalciferol (VITAMIN D3) 2000 units TABS Take 1 tablet by mouth daily.     cilostazol (PLETAL) 100 MG tablet Take 1/2 (one-half) tablet by mouth twice daily 90 tablet 0   Continuous Blood Gluc Receiver (DEXCOM G7 RECEIVER) DEVI Use with Dexcom G7 sensory 1 each 0   Continuous Blood Gluc Sensor (DEXCOM G7 SENSOR) MISC Use one sensor every 10 days 3 each 12   cyclobenzaprine (FLEXERIL) 10 MG tablet Take 1 tablet (10 mg total) by mouth at bedtime. 90 tablet 0   FLUoxetine (PROZAC) 40 MG capsule Take 1 capsule by mouth once daily 90 capsule 0   hydrochlorothiazide (MICROZIDE) 12.5 MG capsule Take 1 capsule by mouth once daily 90 capsule 0   isosorbide mononitrate (IMDUR) 60 MG 24 hr tablet Take 1.5 tablets (90 mg total) by mouth daily. 135 tablet 3   LANTUS SOLOSTAR 100 UNIT/ML Solostar Pen INJECT 16 UNITS SUBCUTANEOUSLY TWICE DAILY 30 mL 0   levETIRAcetam (KEPPRA) 250 MG tablet TAKE 1 TABLET BY MOUTH ONCE DAILY IN THE MORNING, AT NOON, AND AT BEDTIME 270 tablet 0   metFORMIN (GLUCOPHAGE) 1000 MG tablet TAKE 1 TABLET BY MOUTH TWICE DAILY WITH MEALS 180 tablet 0   nitroGLYCERIN (NITROSTAT) 0.4 MG SL tablet Place 1 tablet (0.4 mg total) under the tongue every 5 (five) minutes x 3 doses as needed for chest pain. 30 tablet 12   nystatin cream (MYCOSTATIN) Apply 1 application topically 2 (two) times daily. 30 g 2   pravastatin (PRAVACHOL) 40 MG tablet Take 1 tablet (40 mg total) by mouth at bedtime. 90 tablet 1   pregabalin (LYRICA) 100 MG capsule Take 1 capsule by mouth twice daily 180 capsule 1   propranolol (INDERAL) 40 MG tablet Take 1 tablet (40 mg total) by mouth 2 (two) times daily. 180 tablet 1   RELION PEN NEEDLES 31G X 6 MM  MISC 1 each by Other route as directed. 50 each 2   Semaglutide,0.25 or 0.5MG /DOS, (OZEMPIC, 0.25 OR 0.5 MG/DOSE,) 2 MG/1.5ML SOPN Inject 0.5 mg into the skin once a week. 1.5 mL 2   No current facility-administered  medications on file prior to visit.    BP 110/60   Pulse 95   Temp 97.9 F (36.6 C) (Oral)   Ht  (1.6 m)   Wt 142 lb (64.4 kg)   SpO2 95%   BMI 25.15 kg/m       Objective:   Physical Exam Vitals and nursing note reviewed.  Constitutional:      Appearance: Normal appearance. She is normal weight.  HENT:     Right Ear: Tympanic membrane, ear canal and external ear normal. There is no impacted cerumen.     Left Ear: Tympanic membrane, ear canal and external ear normal.     Nose: Nose normal.     Mouth/Throat:     Mouth: Mucous membranes are moist.     Pharynx: Oropharynx is clear.  Eyes:     Extraocular Movements: Extraocular movements intact.     Conjunctiva/sclera: Conjunctivae normal.     Pupils: Pupils are equal, round, and reactive to light.  Cardiovascular:     Rate and Rhythm: Normal rate and regular rhythm.     Pulses: Normal pulses.     Heart sounds: Normal heart sounds.  Pulmonary:     Effort: Pulmonary effort is normal.     Breath sounds: Normal breath sounds.  Abdominal:     General: Abdomen is flat. Bowel sounds are normal. There is no distension.     Palpations: Abdomen is soft. There is no mass.     Tenderness: There is no abdominal tenderness. There is no guarding or rebound.     Hernia: No hernia is present.  Musculoskeletal:        General: Normal range of motion.     Cervical back: Normal range of motion and neck supple.  Skin:    General: Skin is warm and dry.  Neurological:     General: No focal deficit present.     Mental Status: She is alert and oriented to person, place, and time.     Cranial Nerves: No cranial nerve deficit.     Sensory: No sensory deficit.     Motor: No weakness.     Gait: Gait normal.  Psychiatric:        Mood and Affect: Mood normal.        Behavior: Behavior normal.        Thought Content: Thought content normal.        Judgment: Judgment normal.       Assessment & Plan:  1. Routine general medical  examination at a health care facility - Encouraged heart healthy diet and exercise - Follow up in one year for next CPE  - CBC with Differential/Platelet; Future - Comprehensive metabolic panel; Future - Hemoglobin A1c; Future - Lipid panel; Future - TSH; Future - Microalbumin/Creatinine Ratio, Urine; Future - Hep C Antibody; Future - Hep C Antibody - Microalbumin/Creatinine Ratio, Urine - Levetiracetam level - TSH - Lipid panel - Hemoglobin A1c - Comprehensive metabolic panel - CBC with Differential/Platelet  2. Essential hypertension - Well controlled. No change in medication  - CBC with Differential/Platelet; Future - Comprehensive metabolic panel; Future - Hemoglobin A1c; Future - Lipid panel; Future - TSH; Future - Microalbumin/Creatinine Ratio, Urine; Future - Hep C Antibody; Future -  Hep C Antibody - Microalbumin/Creatinine Ratio, Urine - Levetiracetam level - TSH - Lipid panel - Hemoglobin A1c - Comprehensive metabolic panel - CBC with Differential/Platelet  3. Type 2 diabetes mellitus with complication, without long-term current use of insulin (HCC) - Likely increase Ozempic to 1 mg and decrease insulin further.  - Follow up in 3 months  - CBC with Differential/Platelet; Future - Comprehensive metabolic panel; Future - Hemoglobin A1c; Future - Lipid panel; Future - TSH; Future - Microalbumin/Creatinine Ratio, Urine; Future - Hep C Antibody; Future - Hep C Antibody - Microalbumin/Creatinine Ratio, Urine - Levetiracetam level - TSH - Lipid panel - Hemoglobin A1c - Comprehensive metabolic panel - CBC with Differential/Platelet  4. Depression, unspecified depression type - Continue Buspar and Prozac  5. Mixed hyperlipidemia - Continue statin. Consider increase in dose or switching to higher intensity statin  - CBC with Differential/Platelet; Future - Comprehensive metabolic panel; Future - Hemoglobin A1c; Future - Lipid panel; Future - TSH;  Future - Microalbumin/Creatinine Ratio, Urine; Future - Hep C Antibody; Future - Hep C Antibody - Microalbumin/Creatinine Ratio, Urine - Levetiracetam level - TSH - Lipid panel - Hemoglobin A1c - Comprehensive metabolic panel - CBC with Differential/Platelet  6. Fibromyalgia - Continue Lyrica 100 mg BID  - CBC with Differential/Platelet; Future - Comprehensive metabolic panel; Future - Hemoglobin A1c; Future - Lipid panel; Future - TSH; Future - Microalbumin/Creatinine Ratio, Urine; Future - Hep C Antibody; Future - Hep C Antibody - Microalbumin/Creatinine Ratio, Urine - Levetiracetam level - TSH - Lipid panel - Hemoglobin A1c - Comprehensive metabolic panel - CBC with Differential/Platelet  7. Intractable epilepsy without status epilepticus, unspecified epilepsy type (HCC) - Continue Keppra 250 mg TID - Levetiracetam level; Future  8. Coronary artery disease involving native coronary artery of native heart without angina pectoris - Continue statin and ASA - CBC with Differential/Platelet; Future - Comprehensive metabolic panel; Future - Hemoglobin A1c; Future - Lipid panel; Future - TSH; Future - Microalbumin/Creatinine Ratio, Urine; Future - Hep C Antibody; Future - Hep C Antibody - Microalbumin/Creatinine Ratio, Urine - Levetiracetam level - TSH - Lipid panel - Hemoglobin A1c - Comprehensive metabolic panel - CBC with Differential/Platelet  9. Tobacco use disorder - Encouraged to quit smoking  - CT CHEST LUNG CA SCREEN LOW DOSE W/O CM; Future  10. Peripheral arterial disease (HCC) - Continue current medications  - CBC with Differential/Platelet; Future - Comprehensive metabolic panel; Future - Hemoglobin A1c; Future - Lipid panel; Future - Microalbumin/Creatinine Ratio, Urine; Future - Hep C Antibody; Future - Hep C Antibody - Microalbumin/Creatinine Ratio, Urine - Levetiracetam level - TSH - Lipid panel - Hemoglobin A1c - Comprehensive  metabolic panel - CBC with Differential/Platelet  Shirline Frees, NP

## 2022-02-25 LAB — COMPREHENSIVE METABOLIC PANEL
ALT: 10 U/L (ref 0–35)
AST: 14 U/L (ref 0–37)
Albumin: 4.1 g/dL (ref 3.5–5.2)
Alkaline Phosphatase: 56 U/L (ref 39–117)
BUN: 19 mg/dL (ref 6–23)
CO2: 24 mEq/L (ref 19–32)
Calcium: 11.1 mg/dL — ABNORMAL HIGH (ref 8.4–10.5)
Chloride: 100 mEq/L (ref 96–112)
Creatinine, Ser: 1.62 mg/dL — ABNORMAL HIGH (ref 0.40–1.20)
GFR: 33.49 mL/min — ABNORMAL LOW (ref >60.00)
Glucose, Bld: 131 mg/dL — ABNORMAL HIGH (ref 70–99)
Potassium: 4.1 mEq/L (ref 3.5–5.1)
Sodium: 138 mEq/L (ref 135–145)
Total Bilirubin: 0.3 mg/dL (ref 0.2–1.2)
Total Protein: 7 g/dL (ref 6.0–8.3)

## 2022-02-25 LAB — CBC WITH DIFFERENTIAL/PLATELET
Basophils Absolute: 0.1 10*3/uL (ref 0.0–0.1)
Basophils Relative: 0.6 % (ref 0.0–3.0)
Eosinophils Absolute: 0.4 10*3/uL (ref 0.0–0.7)
Eosinophils Relative: 3.1 % (ref 0.0–5.0)
HCT: 41 % (ref 36.0–46.0)
Hemoglobin: 13.3 g/dL (ref 12.0–15.0)
Lymphocytes Relative: 24.8 % (ref 12.0–46.0)
Lymphs Abs: 3.4 10*3/uL (ref 0.7–4.0)
MCHC: 32.4 g/dL (ref 30.0–36.0)
MCV: 93.6 fl (ref 78.0–100.0)
Monocytes Absolute: 1.2 10*3/uL — ABNORMAL HIGH (ref 0.1–1.0)
Monocytes Relative: 8.9 % (ref 3.0–12.0)
Neutro Abs: 8.6 10*3/uL — ABNORMAL HIGH (ref 1.4–7.7)
Neutrophils Relative %: 62.6 % (ref 43.0–77.0)
Platelets: 334 10*3/uL (ref 150.0–400.0)
RBC: 4.38 Mil/uL (ref 3.87–5.11)
RDW: 13.7 % (ref 11.5–15.5)
WBC: 13.8 10*3/uL — ABNORMAL HIGH (ref 4.0–10.5)

## 2022-02-25 LAB — LDL CHOLESTEROL, DIRECT: Direct LDL: 72 mg/dL

## 2022-02-25 LAB — MICROALBUMIN / CREATININE URINE RATIO
Creatinine,U: 80.1 mg/dL
Microalb Creat Ratio: 1.9 mg/g (ref 0.0–30.0)
Microalb, Ur: 1.5 mg/dL (ref 0.0–1.9)

## 2022-02-25 LAB — TSH: TSH: 1.55 u[IU]/mL (ref 0.35–5.50)

## 2022-02-25 LAB — LIPID PANEL
Cholesterol: 150 mg/dL (ref 0–200)
HDL: 46.4 mg/dL (ref >39.00)
NonHDL: 103.67
Total CHOL/HDL Ratio: 3
Triglycerides: 269 mg/dL — ABNORMAL HIGH (ref 0.0–149.0)
VLDL: 53.8 mg/dL — ABNORMAL HIGH (ref 0.0–40.0)

## 2022-02-25 LAB — HEMOGLOBIN A1C: Hgb A1c MFr Bld: 7.3 % — ABNORMAL HIGH (ref 4.6–6.5)

## 2022-02-26 NOTE — Addendum Note (Signed)
Addended by: Waymon Amato R on: 02/26/2022 05:00 PM   Modules accepted: Orders

## 2022-03-01 LAB — HEPATITIS C ANTIBODY
Hepatitis C Ab: NONREACTIVE
SIGNAL TO CUT-OFF: 0.11 (ref <1.00)

## 2022-03-01 LAB — LEVETIRACETAM LEVEL: Keppra (Levetiracetam): 25.1 ug/mL

## 2022-03-07 ENCOUNTER — Other Ambulatory Visit: Payer: Self-pay | Admitting: Adult Health

## 2022-03-07 DIAGNOSIS — M797 Fibromyalgia: Secondary | ICD-10-CM

## 2022-03-15 ENCOUNTER — Other Ambulatory Visit: Payer: Self-pay | Admitting: Internal Medicine

## 2022-03-15 ENCOUNTER — Other Ambulatory Visit: Payer: Self-pay | Admitting: Adult Health

## 2022-03-15 DIAGNOSIS — I10 Essential (primary) hypertension: Secondary | ICD-10-CM

## 2022-03-22 ENCOUNTER — Encounter: Payer: Self-pay | Admitting: Adult Health

## 2022-03-23 NOTE — Telephone Encounter (Signed)
From OV notes on 02/24/22 3. Type 2 diabetes mellitus with complication, without long-term current use of insulin (HCC) - Likely increase Ozempic to 1 mg and decrease insulin further.  - Follow up in 3 months  Result note from labs drawn that day: I would like to discontinue Metformin due to decreased kidney function and increase Ozempic to 1 mg weekly.    Follow up in 3 months for recheck   Will prescribe medication based on notes, pt needs appt for on or around 05/27/22

## 2022-03-24 MED ORDER — SEMAGLUTIDE (1 MG/DOSE) 4 MG/3ML ~~LOC~~ SOPN: 1.0000 mg | PEN_INJECTOR | SUBCUTANEOUS | 2 refills | Status: DC

## 2022-04-10 ENCOUNTER — Other Ambulatory Visit: Payer: Self-pay | Admitting: Adult Health

## 2022-04-19 ENCOUNTER — Telehealth: Payer: Self-pay | Admitting: Pharmacist

## 2022-04-19 NOTE — Chronic Care Management (AMB) (Unsigned)
Chronic Care Management Pharmacy Assistant   Name: [name]  MRN: [id number] DOB: [birth year]  Reason for Encounter: Disease State / Diabetes Assessment Call   Conditions to be addressed/monitored: DMII  Recent office visits:  02/24/2022 Shirline Frees NP - Patient was seen for Routine general medical examination at a health care facility and additional issues. Discontinued Amitriptyline and Metformin. Increase Ozempic to 1 mg weekly. Follow up in 3 weeks.   Thomasenia Sales LPN - Medicare annual wellness exam  Recent consult visits:  None  Hospital visits:  None  Medications: Outpatient Encounter Medications as of 04/19/2022  Medication Sig   B-D ULTRAFINE III SHORT PEN 31G X 8 MM MISC USE AS DIRECTED   busPIRone (BUSPAR) 15 MG tablet TAKE 1 TABLET BY MOUTH THREE TIMES DAILY   Cholecalciferol (VITAMIN D3) 2000 units TABS Take 1 tablet by mouth daily.   cilostazol (PLETAL) 100 MG tablet Take 1/2 (one-half) tablet by mouth twice daily   Continuous Blood Gluc Receiver (DEXCOM G7 RECEIVER) DEVI Use with Dexcom G7 sensory   Continuous Blood Gluc Sensor (DEXCOM G7 SENSOR) MISC Use one sensor every 10 days   cyclobenzaprine (FLEXERIL) 10 MG tablet Take 1 tablet (10 mg total) by mouth at bedtime.   FLUoxetine (PROZAC) 40 MG capsule Take 1 capsule by mouth once daily   hydrochlorothiazide (MICROZIDE) 12.5 MG capsule Take 1 capsule by mouth once daily   isosorbide mononitrate (IMDUR) 60 MG 24 hr tablet Take 1.5 tablets (90 mg total) by mouth daily.   LANTUS SOLOSTAR 100 UNIT/ML Solostar Pen INJECT 16 UNITS SUBCUTANEOUSLY TWICE DAILY   levETIRAcetam (KEPPRA) 250 MG tablet TAKE 1 TABLET BY MOUTH ONCE DAILY IN THE MORNING, AT NOON, AND AT BEDTIME   nitroGLYCERIN (NITROSTAT) 0.4 MG SL tablet Place 1 tablet (0.4 mg total) under the tongue every 5 (five) minutes x 3 doses as needed for chest pain.   nystatin cream (MYCOSTATIN) Apply 1 application topically 2 (two) times daily.    pravastatin (PRAVACHOL) 40 MG tablet Take 1 tablet (40 mg total) by mouth at bedtime.   pregabalin (LYRICA) 100 MG capsule Take 1 capsule by mouth twice daily   propranolol (INDERAL) 40 MG tablet Take 1 tablet (40 mg total) by mouth 2 (two) times daily.   RELION PEN NEEDLES 31G X 6 MM MISC 1 each by Other route as directed.   Semaglutide, 1 MG/DOSE, 4 MG/3ML SOPN Inject 1 mg as directed once a week.   No facility-administered encounter medications on file as of 04/19/2022.  Fill History: BusPIRone 15MG       TAB 01/12/2022 90   CILOSTAZOL 100MG     TAB 03/19/2022 90   FLUOXETINE 40MG  CAP 12/22/2021 90   HYDROCHLOROTHIAZIDE 12.5MG  CAP 03/16/2022 90   LANTUS SOLOSTAR     INJ 12/22/2021 46   ISOSORB MONO ER 60MG  TAB 02/03/2022 90   LevETIRAcetam 250MG  TAB 02/12/2022 90   PRAVASTATIN SODIUM  40 MG TABS 02/15/2022 90   PREGABALIN 100MG  CAP 03/09/2022 90   PROPRANOLOL 40MG     TAB 04/05/2022 90   OZEMPIC 1MG /DOSE 4MG /3ML PEN 03/24/2022 28   Recent Relevant Labs: Lab Results  Component Value Date/Time   HGBA1C 7.3 (H) 02/24/2022 03:08 PM   HGBA1C 7.4 (A) 11/06/2021 03:29 PM   HGBA1C 8.8 (H) 08/06/2021 02:33 PM   MICROALBUR 1.5 02/24/2022 03:08 PM   MICROALBUR 1.4 04/05/2013 02:05 PM    Kidney Function Lab Results  Component Value Date/Time   CREATININE 1.62 (H) 02/24/2022 03:08  PM   CREATININE 1.81 (H) 08/06/2021 02:33 PM   CREATININE 0.9 03/12/2015 11:15 AM   GFR 33.49 (L) 02/24/2022 03:08 PM   GFRNONAA 54 (L) 08/21/2018 10:07 PM   GFRAA >60 08/21/2018 10:07 PM   SCHEDULE FU (SEPT 26@8 :30) OR NEXT OPEN APPT Current antihyperglycemic regimen:  Lantus Solostar 100 un/ml - 16 units twice daily Ozempic 1mg /dose - inject 1 mg weekly  What recent interventions/DTPs have been made to improve glycemic control:  Discontinued Amitriptyline and Metformin. Increase Ozempic to 1 mg weekly.   Have there been any recent hospitalizations or ED visits since last visit with CPP? No  recent hospital visits  Patient {reports/denies:24182} hypoglycemic symptoms, including {Hypoglycemic Symptoms:3049003}  Patient {reports/denies:24182} hyperglycemic symptoms, including {symptoms; hyperglycemia:17903}  How often are you checking your blood sugar? {BG Testing frequency:23922}  What are your blood sugars ranging?  Fasting: *** Before meals: *** After meals: *** Bedtime: ***  During the week, how often does your blood glucose drop below 70? {LowBGfrequency:24142}  Are you checking your feet daily/regularly? ***  Adherence Review: Is the patient currently on a STATIN medication? Yes Is the patient currently on ACE/ARB medication? No Does the patient have >5 day gap between last estimated fill dates? No  Care Gaps: AWV - completed 02/23/2022  Last BP - 110/60 on 02/24/2022 Last A1C - 7.3 on 02/24/2022 Covid vaccine - never done Pap smear - never done Eye exam - overdue Shingrix - postponed Mammogram - postponed Colonoscopy - postponed Tdap - postponed Flu - postponed  Star Rating Drugs: Ozempic 1mg /dose - last filled 03/24/2022 28 DS at [facility] Pravastatin 40 mg - last filled 02/15/2022 90 DS at [facility]  03/26/2022 [facility]  Clinical Pharmacist Assistant [phone]

## 2022-04-20 ENCOUNTER — Other Ambulatory Visit: Payer: Self-pay | Admitting: Adult Health

## 2022-04-20 DIAGNOSIS — E118 Type 2 diabetes mellitus with unspecified complications: Secondary | ICD-10-CM

## 2022-04-20 DIAGNOSIS — I10 Essential (primary) hypertension: Secondary | ICD-10-CM

## 2022-04-23 NOTE — Telephone Encounter (Signed)
Any suggestions for a follow up appointment? I can respond back to him once I hear from you.

## 2022-05-06 ENCOUNTER — Encounter: Payer: Self-pay | Admitting: Adult Health

## 2022-05-06 ENCOUNTER — Ambulatory Visit (INDEPENDENT_AMBULATORY_CARE_PROVIDER_SITE_OTHER): Payer: Medicare Other | Admitting: Adult Health

## 2022-05-06 VITALS — BP 140/60 | HR 84 | Temp 97.7°F | Ht 63.0 in | Wt 129.0 lb

## 2022-05-06 DIAGNOSIS — N289 Disorder of kidney and ureter, unspecified: Secondary | ICD-10-CM

## 2022-05-06 DIAGNOSIS — R112 Nausea with vomiting, unspecified: Secondary | ICD-10-CM

## 2022-05-06 DIAGNOSIS — R2681 Unsteadiness on feet: Secondary | ICD-10-CM | POA: Diagnosis not present

## 2022-05-06 DIAGNOSIS — D72829 Elevated white blood cell count, unspecified: Secondary | ICD-10-CM

## 2022-05-06 DIAGNOSIS — E118 Type 2 diabetes mellitus with unspecified complications: Secondary | ICD-10-CM

## 2022-05-06 DIAGNOSIS — R1310 Dysphagia, unspecified: Secondary | ICD-10-CM | POA: Diagnosis not present

## 2022-05-06 LAB — COMPREHENSIVE METABOLIC PANEL
ALT: 11 U/L (ref 0–35)
AST: 21 U/L (ref 0–37)
Albumin: 3.8 g/dL (ref 3.5–5.2)
Alkaline Phosphatase: 55 U/L (ref 39–117)
BUN: 14 mg/dL (ref 6–23)
CO2: 26 mEq/L (ref 19–32)
Calcium: 10.1 mg/dL (ref 8.4–10.5)
Chloride: 99 mEq/L (ref 96–112)
Creatinine, Ser: 1.48 mg/dL — ABNORMAL HIGH (ref 0.40–1.20)
GFR: 37.28 mL/min — ABNORMAL LOW (ref >60.00)
Glucose, Bld: 226 mg/dL — ABNORMAL HIGH (ref 70–99)
Potassium: 3.8 mEq/L (ref 3.5–5.1)
Sodium: 139 mEq/L (ref 135–145)
Total Bilirubin: 0.3 mg/dL (ref 0.2–1.2)
Total Protein: 6.6 g/dL (ref 6.0–8.3)

## 2022-05-06 LAB — CBC WITH DIFFERENTIAL/PLATELET
Basophils Absolute: 0.1 10*3/uL (ref 0.0–0.1)
Basophils Relative: 0.5 % (ref 0.0–3.0)
Eosinophils Absolute: 0.4 10*3/uL (ref 0.0–0.7)
Eosinophils Relative: 2.5 % (ref 0.0–5.0)
HCT: 44.9 % (ref 36.0–46.0)
Hemoglobin: 14.5 g/dL (ref 12.0–15.0)
Lymphocytes Relative: 19.5 % (ref 12.0–46.0)
Lymphs Abs: 3.4 10*3/uL (ref 0.7–4.0)
MCHC: 32.4 g/dL (ref 30.0–36.0)
MCV: 94 fl (ref 78.0–100.0)
Monocytes Absolute: 1.5 10*3/uL — ABNORMAL HIGH (ref 0.1–1.0)
Monocytes Relative: 8.5 % (ref 3.0–12.0)
Neutro Abs: 11.9 10*3/uL — ABNORMAL HIGH (ref 1.4–7.7)
Neutrophils Relative %: 69 % (ref 43.0–77.0)
Platelets: 275 10*3/uL (ref 150.0–400.0)
RBC: 4.77 Mil/uL (ref 3.87–5.11)
RDW: 15.1 % (ref 11.5–15.5)
WBC: 17.2 10*3/uL — ABNORMAL HIGH (ref 4.0–10.5)

## 2022-05-06 LAB — HEMOGLOBIN A1C: Hgb A1c MFr Bld: 8.1 % — ABNORMAL HIGH (ref 4.6–6.5)

## 2022-05-06 MED ORDER — METOCLOPRAMIDE HCL 5 MG PO TABS: 5.0000 mg | ORAL_TABLET | Freq: Three times a day (TID) | ORAL | 1 refills | Status: DC | PRN

## 2022-05-06 NOTE — Progress Notes (Signed)
Subjective:    Patient ID: [name], female    DOB: 1955-11-17, 66 y.o.   MRN: [id number]  HPI 66 year old female who  has a past medical history of Acute MI, inferior wall, initial episode of care [facility]) (07/08/2012), Anxiety, Asthma, Coronary artery disease, Depression, Diabetes mellitus, Fibromyalgia, Leukocytosis, Monocytosis, Peripheral arterial disease ([place]) (01/23/2013), and Tobacco use disorder (07/08/2012).  She is being evaluated today for follow-up after being seen in the emergency room at [facility] care on April 22, 2022.  She presented to the emergency room complaining of nausea, vomiting, abdominal pain.  In the ER she had labs and imaging done which showed no acute life-threatening disorder, her UA demonstrated signs of a UTI and she was prescribed a course of Macrobid.  A few weeks prior she had also just started her increase dose of Ozempic   Wt Readings from Last 3 Encounters:  05/06/22 129 lb (58.5 kg)  02/24/22 142 lb (64.4 kg)  02/23/22 140 lb (63.5 kg)   Today her husband is with her to help provide history   She reports that she continues to have nausea and vomiting with abdominal pain stating "it feels like I have a tennis ball in my stomach".  Her appetite continues to be decreased.  She reports that she took her dose of Ozempic about 6 days ago.  Did not have the symptoms when on Ozempic 0.25 or 0.5 mg.  She also reports feeling as though she is weak and her gait is unsteady.  She has fallen a few times at home without injury.  She also reports that for an unknown amount of time she has been experiencing a choking sensation with swallowing her medications as well as drinking liquids and eating food.  This was present prior to starting Ozempic.  Review of Systems See HPI   Past Medical History:  Diagnosis Date   Acute MI, inferior wall, initial episode of care ([place]) 07/08/2012   Cath-07/08/11 -distal RCA stent DES (99%), LAD 20-30%. EF 50% inferior  hypokinesis  (infarct)   Anxiety    Asthma    Coronary artery disease    Depression    Diabetes mellitus    Fibromyalgia    Leukocytosis    Monocytosis    Peripheral arterial disease ([place]) 01/23/2013   Tobacco use disorder 07/08/2012    Social History   Socioeconomic History   Marital status: Married    Spouse name: Not on file   Number of children: 3   Years of education: college   Highest education level: Not on file  Occupational History   Not on file  Tobacco Use   Smoking status: Every Day    Packs/day: 1.00    Years: 43.00    Total pack years: 43.00    Types: Cigarettes   Smokeless tobacco: Never  Substance and Sexual Activity   Alcohol use: No    Alcohol/week: 0.0 standard drinks of alcohol    Comment: Rarely   Drug use: No   Sexual activity: Yes  Other Topics Concern   Not on file  Social History Narrative   Lives with husband, married for 20 years.      They have three grown chlildren.   She is not currently working and is on disability   Highest level of education:  Forensic psychologist   Pets: two dogs and three rabbits.       Husband is a long Associate Professor.  Is unable to enjoy any activities because of body pains.       Social Determinants of Health   Financial Resource Strain: Medium Risk (07/27/2021)   Overall Financial Resource Strain (CARDIA)    Difficulty of Paying Living Expenses: Somewhat hard  Food Insecurity: No Food Insecurity (02/23/2022)   Hunger Vital Sign    Worried About Running Out of Food in the Last Year: Never true    Ran Out of Food in the Last Year: Never true  Transportation Needs: No Transportation Needs (02/23/2022)   PRAPARE - Hydrologist (Medical): No    Lack of Transportation (Non-Medical): No  Physical Activity: Inactive (02/23/2022)   Exercise Vital Sign    Days of Exercise per Week: 0 days    Minutes of Exercise per Session: 0 min  Stress: No Stress Concern Present (02/23/2022)    [place]    Feeling of Stress : Not at all  Social Connections: Socially Isolated (02/23/2022)   Social Connection and Isolation Panel [NHANES]    Frequency of Communication with Friends and Family: Once a week    Frequency of Social Gatherings with Friends and Family: Once a week    Attends Religious Services: Never    Marine scientist or Organizations: No    Attends Archivist Meetings: Never    Marital Status: Married  Human resources officer Violence: Not At Risk (02/19/2021)   Humiliation, Afraid, Rape, and Kick questionnaire    Fear of Current or Ex-Partner: No    Emotionally Abused: No    Physically Abused: No    Sexually Abused: No    Past Surgical History:  Procedure Laterality Date   ABDOMINAL AORTAGRAM N/A 02/07/2013   Procedure: ABDOMINAL Maxcine Ham;  Surgeon: Wellington Hampshire, MD;  Location: [place] CATH LAB;  Service: Cardiovascular;  Laterality: N/A;   CARDIAC CATHETERIZATION     CORONARY STENT PLACEMENT     CYST REMOVAL TRUNK  1977   tailbone   LEFT HEART CATHETERIZATION WITH CORONARY ANGIOGRAM N/A 07/07/2012   Procedure: LEFT HEART CATHETERIZATION WITH CORONARY ANGIOGRAM;  Surgeon: Burnell Blanks, MD;  Location: [facility] CATH LAB;  Service: Cardiovascular;  Laterality: N/A;   PERCUTANEOUS CORONARY STENT INTERVENTION (PCI-S)  07/07/2012   Procedure: PERCUTANEOUS CORONARY STENT INTERVENTION (PCI-S);  Surgeon: Burnell Blanks, MD;  Location: [facility] CATH LAB;  Service: Cardiovascular;;   TONSILLECTOMY      Family History  Problem Relation Age of Onset   Kidney disease Mother    Diabetes Mother        DM Type II   Alzheimer's disease Mother    Diabetes Father        IDDM   Heart disease Father    Cancer Father        bone cancer died at 4   Cancer Sister        bone cancer at age 84   Multiple sclerosis Daughter    Diabetes Son 2       DM Type I   Heart disease Maternal  Grandfather    Heart disease Paternal Grandmother    Diabetes Son 3       DM Type I    Allergies  Allergen Reactions   Erythromycin Other (See Comments)   Statins     SEVERE MUSCLE PAIN - failed lipitor, Livalo, red yeast rice (lovastatin), and she thinks Zocor and pravastatin   Ace Inhibitors Cough  Patient refuses to take meds   Cymbalta [Duloxetine Hcl]     Drowsiness    Current Outpatient Medications on File Prior to Visit  Medication Sig Dispense Refill   B-D ULTRAFINE III SHORT PEN 31G X 8 MM MISC USE AS DIRECTED 100 each 0   busPIRone (BUSPAR) 15 MG tablet TAKE 1 TABLET BY MOUTH THREE TIMES DAILY 270 tablet 0   Cholecalciferol (VITAMIN D3) 2000 units TABS Take 1 tablet by mouth daily.     cilostazol (PLETAL) 100 MG tablet Take 1/2 (one-half) tablet by mouth twice daily 90 tablet 0   Continuous Blood Gluc Receiver (DEXCOM G7 RECEIVER) DEVI Use with Dexcom G7 sensory 1 each 0   Continuous Blood Gluc Sensor (DEXCOM G7 SENSOR) MISC Use one sensor every 10 days 3 each 12   cyclobenzaprine (FLEXERIL) 10 MG tablet Take 1 tablet (10 mg total) by mouth at bedtime. 90 tablet 0   FLUoxetine (PROZAC) 40 MG capsule Take 1 capsule by mouth once daily 90 capsule 0   hydrochlorothiazide (MICROZIDE) 12.5 MG capsule Take 1 capsule by mouth once daily 90 capsule 2   isosorbide mononitrate (IMDUR) 60 MG 24 hr tablet Take 1.5 tablets (90 mg total) by mouth daily. 135 tablet 3   LANTUS SOLOSTAR 100 UNIT/ML Solostar Pen INJECT 16 UNITS SUBCUTANEOUSLY TWICE DAILY 30 mL 0   levETIRAcetam (KEPPRA) 250 MG tablet TAKE 1 TABLET BY MOUTH ONCE DAILY IN THE MORNING, AT NOON, AND AT BEDTIME 270 tablet 0   nitroGLYCERIN (NITROSTAT) 0.4 MG SL tablet Place 1 tablet (0.4 mg total) under the tongue every 5 (five) minutes x 3 doses as needed for chest pain. 30 tablet 12   nystatin cream (MYCOSTATIN) Apply 1 application topically 2 (two) times daily. 30 g 2   pravastatin (PRAVACHOL) 40 MG tablet Take 1 tablet (40  mg total) by mouth at bedtime. 90 tablet 1   pregabalin (LYRICA) 100 MG capsule Take 1 capsule by mouth twice daily 180 capsule 0   RELION PEN NEEDLES 31G X 6 MM MISC 1 each by Other route as directed. 50 each 2   Semaglutide, 1 MG/DOSE, 4 MG/3ML SOPN Inject 1 mg as directed once a week. 3 mL 2   nitrofurantoin, macrocrystal-monohydrate, (MACROBID) 100 MG capsule Take 100 mg by mouth 2 (two) times daily.     propranolol (INDERAL) 40 MG tablet Take 1 tablet (40 mg total) by mouth 2 (two) times daily. 180 tablet 1   No current facility-administered medications on file prior to visit.    BP (!) 140/60   Pulse 84   Temp 97.7 F (36.5 C) (Oral)   Ht 5\' 3"  (1.6 m)   Wt 129 lb (58.5 kg)   SpO2 97%   BMI 22.85 kg/m       Objective:   Physical Exam Vitals and nursing note reviewed.  Constitutional:      Appearance: Normal appearance.  Cardiovascular:     Rate and Rhythm: Normal rate and regular rhythm.     Pulses: Normal pulses.     Heart sounds: Normal heart sounds.  Pulmonary:     Effort: Pulmonary effort is normal.     Breath sounds: Normal breath sounds.  Abdominal:     General: Abdomen is flat. Bowel sounds are normal. There is no distension.     Palpations: Abdomen is soft. There is no mass.     Tenderness: There is abdominal tenderness (generalized).  Musculoskeletal:        General:  Normal range of motion.  Skin:    General: Skin is warm and dry.  Neurological:     General: No focal deficit present.     Mental Status: She is alert and oriented to person, place, and time.  Psychiatric:        Mood and Affect: Mood normal.        Behavior: Behavior normal.        Thought Content: Thought content normal.        Judgment: Judgment normal.      Assessment & Plan:  1. Type 2 diabetes mellitus with complication, without long-term current use of insulin (HCC) - Consider increase in insulin  - CBC with Differential/Platelet; Future - Comprehensive metabolic panel;  Future - Hemoglobin A1c; Future - Hemoglobin A1c - Comprehensive metabolic panel - CBC with Differential/Platelet  2. Nausea and vomiting, unspecified vomiting type - Will have her stop ozempic for the time being. Hopefully this will improve her symptoms  - CBC with Differential/Platelet; Future - Comprehensive metabolic panel; Future - Hemoglobin A1c; Future - metoCLOPramide (REGLAN) 5 MG tablet; Take 1 tablet (5 mg total) by mouth every 8 (eight) hours as needed for nausea.  Dispense: 30 tablet; Refill: 1 - Hemoglobin A1c - Comprehensive metabolic panel - CBC with Differential/Platelet  3. Dysphagia, unspecified type  - Ambulatory referral to Gastroenterology  4. Gait instability  - Ambulatory referral to Physical Therapy  Shirline Frees, NP

## 2022-05-06 NOTE — Patient Instructions (Addendum)
I think you are on too much ozempic. Please do not take this for the next few weeks.

## 2022-05-09 ENCOUNTER — Other Ambulatory Visit: Payer: Self-pay | Admitting: Adult Health

## 2022-05-09 DIAGNOSIS — E1165 Type 2 diabetes mellitus with hyperglycemia: Secondary | ICD-10-CM

## 2022-05-09 DIAGNOSIS — R569 Unspecified convulsions: Secondary | ICD-10-CM

## 2022-05-10 MED ORDER — FLUOXETINE HCL 40 MG PO CAPS: 40.0000 mg | ORAL_CAPSULE | Freq: Every day | ORAL | 0 refills | Status: DC

## 2022-05-10 MED ORDER — LANTUS SOLOSTAR 100 UNIT/ML ~~LOC~~ SOPN: 16.0000 [IU] | PEN_INJECTOR | Freq: Two times a day (BID) | SUBCUTANEOUS | 0 refills | Status: DC

## 2022-05-11 MED ORDER — AMLODIPINE BESYLATE 5 MG PO TABS: 5.0000 mg | ORAL_TABLET | Freq: Every day | ORAL | 2 refills | Status: DC

## 2022-05-11 MED ORDER — LEVETIRACETAM 250 MG PO TABS: 250.0000 mg | ORAL_TABLET | Freq: Three times a day (TID) | ORAL | 1 refills | Status: DC

## 2022-05-11 NOTE — Addendum Note (Signed)
Addended by: Waymon Amato R on: 05/11/2022 12:05 PM   Modules accepted: Orders

## 2022-05-17 ENCOUNTER — Other Ambulatory Visit: Payer: Self-pay

## 2022-05-17 ENCOUNTER — Ambulatory Visit: Payer: Medicare Other | Attending: Adult Health

## 2022-05-17 ENCOUNTER — Encounter: Payer: Self-pay | Admitting: Adult Health

## 2022-05-17 DIAGNOSIS — R2681 Unsteadiness on feet: Secondary | ICD-10-CM | POA: Insufficient documentation

## 2022-05-17 DIAGNOSIS — M6281 Muscle weakness (generalized): Secondary | ICD-10-CM | POA: Insufficient documentation

## 2022-05-17 NOTE — Progress Notes (Signed)
Spoke with [place]ogany at [employer], Patient has been approved for Ozempic patient assistance through 09/26/2022. Ozempic will be shipped to the office in 10-14 business days.

## 2022-05-17 NOTE — Therapy (Signed)
OUTPATIENT PHYSICAL THERAPY LOWER EXTREMITY EVALUATION   Patient Name: [name] MRN: [id number] DOB:05-03-1956, 66 y.o., female Today's Date: 05/17/2022   PT End of Session - 05/17/22 1359     Visit Number 1    Number of Visits 6    Date for PT Re-Evaluation 06/25/22    PT Start Time 1400    PT Stop Time 1445    PT Time Calculation (min) 45 min    Activity Tolerance Patient limited by pain    Behavior During Therapy [facility] for tasks assessed/performed             Past Medical History:  Diagnosis Date   Acute MI, inferior wall, initial episode of care (HCC) 07/08/2012   Cath-07/08/11 -distal RCA stent DES (99%), LAD 20-30%. EF 50% inferior hypokinesis  (infarct)   Anxiety    Asthma    Coronary artery disease    Depression    Diabetes mellitus    Fibromyalgia    Leukocytosis    Monocytosis    Peripheral arterial disease (HCC) 01/23/2013   Tobacco use disorder 07/08/2012   Past Surgical History:  Procedure Laterality Date   ABDOMINAL AORTAGRAM N/A 02/07/2013   Procedure: ABDOMINAL Ronny Flurry;  Surgeon: Iran Ouch, MD;  Location: [facility] CATH LAB;  Service: Cardiovascular;  Laterality: N/A;   CARDIAC CATHETERIZATION     CORONARY STENT PLACEMENT     CYST REMOVAL TRUNK  1977   tailbone   LEFT HEART CATHETERIZATION WITH CORONARY ANGIOGRAM N/A 07/07/2012   Procedure: LEFT HEART CATHETERIZATION WITH CORONARY ANGIOGRAM;  Surgeon: Kathleene Hazel, MD;  Location: [facility] CATH LAB;  Service: Cardiovascular;  Laterality: N/A;   PERCUTANEOUS CORONARY STENT INTERVENTION (PCI-S)  07/07/2012   Procedure: PERCUTANEOUS CORONARY STENT INTERVENTION (PCI-S);  Surgeon: Kathleene Hazel, MD;  Location: [facility] CATH LAB;  Service: Cardiovascular;;   TONSILLECTOMY     Patient Active Problem List   Diagnosis Date Noted   Coronary artery disease involving native coronary artery of native heart without angina pectoris 12/23/2020   Type 2 diabetes mellitus with complication, without  long-term current use of insulin (HCC) 12/23/2020   Hyperlipidemia 01/31/2014   Pain in limb 12/26/2013   Disturbance of skin sensation 12/26/2013   Peripheral arterial disease (HCC) 01/23/2013   Leukocytosis    Monocytosis    Acute MI, inferior wall, initial episode of care (HCC) 07/08/2012   Tobacco use disorder 07/08/2012   Fibromyalgia 07/08/2012   Depression 07/08/2012   Diabetes mellitus type II, uncontrolled (HCC) 07/08/2012   REFERRING PROVIDER: Shirline Frees, NP  REFERRING DIAG: Gait instability  THERAPY DIAG:  Unsteadiness on feet  Muscle weakness (generalized)  Rationale for Evaluation and Treatment Rehabilitation  ONSET DATE: over 2 years ago  SUBJECTIVE:   SUBJECTIVE STATEMENT: Patient reports that she began falling about 2-3 years ago. She notes that she has fallen about 20 or more times in the past six months. She and her husband has noticed a huge improvement last Tuesday in her pain and mobility.   PERTINENT HISTORY: Fibromyalgia, DM, chronic pain, depression, history of multiple UTI's, incontinence, history of MI, PAD, smoker  PAIN:  Are you having pain? Yes: NPRS scale: 9/10 Pain location: neck, back, and hips Pain description: sharp Aggravating factors: walking Relieving factors: massage or chiropractor  PRECAUTIONS: Fall  WEIGHT BEARING RESTRICTIONS No  FALLS:  Has patient fallen in last 6 months? Yes. Number of falls 20+; with most recent being about 20-30 days  LIVING ENVIRONMENT: Lives with: lives with  their spouse Lives in: House/apartment Stairs: Yes: External: 4-5 steps; can reach both; step to pattern Has following equipment at home: Quad cane large base, Wheelchair (manual), Shower bench, and Grab bars  OCCUPATION: disabled   PLOF: Independent with basic ADLs  PATIENT GOALS strengthening legs, stand longer than 5 minutes   OBJECTIVE:   COGNITION:  Overall cognitive status: Within functional limits for tasks  assessed     SENSATION: Patient reports numbness in both hands and feet  POSTURE: rounded shoulders, forward head, decreased lumbar lordosis, and flexed trunk   LOWER EXTREMITY ROM: limited bilateral knee extension  LOWER EXTREMITY MMT:  MMT Right eval Left eval  Hip flexion 3+/5 3/5  Hip extension    Hip abduction    Hip adduction    Hip internal rotation    Hip external rotation    Knee flexion 5/5 5/5  Knee extension 3+/5 4-/5 with pain radiating up to neck  Ankle dorsiflexion 4-/5 4-/5  Ankle plantarflexion    Ankle inversion    Ankle eversion     (Blank rows = not tested)  FUNCTIONAL TESTS:  5 times sit to stand: 29.28 seconds with UE support; feels SOB (96% SpO2) Timed up and go (TUG): 22.15 seconds with quad cane  GAIT: Assistive device utilized: Quad cane large base Level of assistance: Modified independence Comments: reduced step length, gait speed, and narrow BOS  Patient presented to treatment utilizing a wheelchair for mobility    TODAY'S TREATMENT:    PATIENT EDUCATION:  Education details: benefits of pelvic health PT, prognosis  Person educated: Patient and Spouse Education method: Explanation Education comprehension: verbalized understanding   HOME EXERCISE PROGRAM:   ASSESSMENT:  CLINICAL IMPRESSION: Patient is a 66 y.o. female who was seen today for physical therapy evaluation and treatment for gait instability and a history of falls. She is at a high risk of fall as evidenced by her history of falling, lower extremity weakness, and TUG and five time sit to stand times. Recommend that she continue with skilled physical therapy to address her remaining impairments to maximize her safety and functional mobility.     OBJECTIVE IMPAIRMENTS Abnormal gait, decreased activity tolerance, decreased balance, decreased mobility, difficulty walking, decreased ROM, decreased strength, postural dysfunction, and pain.   ACTIVITY LIMITATIONS carrying,  lifting, bending, standing, squatting, sleeping, stairs, transfers, and locomotion level  PARTICIPATION LIMITATIONS: meal prep, cleaning, laundry, shopping, and community activity  PERSONAL FACTORS Past/current experiences, Time since onset of injury/illness/exacerbation, Transportation, and 3+ comorbidities: Fibromyalgia, DM, chronic pain, depression, history of multiple UTI's, incontinence, history of MI, PAD, smoker  are also affecting patient's functional outcome.   REHAB POTENTIAL: Poor    CLINICAL DECISION MAKING: Unstable/unpredictable  EVALUATION COMPLEXITY: High   GOALS: Goals reviewed with patient? No  LONG TERM GOALS: Target date: 06/07/2022   Patient will be independent with her HEP.  Baseline:  Goal status: INITIAL  2.  Patient will improve her TUG time to 15 seconds or less.  Baseline:  Goal status: INITIAL  3.  Patient will be able to improve her five time sit to stand to 20 seconds or less with upper extremity support for improved lower extremity strength and power.  Baseline:  Goal status: INITIAL  4.  Patient will report being able to walk for at least 10 minutes.  Baseline:  Goal status: INITIAL   PLAN: PT FREQUENCY: 2x/week  PT DURATION: 3 weeks  PLANNED INTERVENTIONS: Therapeutic exercises, Therapeutic activity, Neuromuscular re-education, Balance training, Gait training, Patient/Family  education, Self Care, Stair training, and Re-evaluation  PLAN FOR NEXT SESSION: nustep, seated LE strengthening    Granville Lewis, PT 05/17/2022, 3:47 PM

## 2022-05-25 ENCOUNTER — Ambulatory Visit: Payer: Medicare Other

## 2022-05-25 DIAGNOSIS — R2681 Unsteadiness on feet: Secondary | ICD-10-CM | POA: Diagnosis not present

## 2022-05-25 DIAGNOSIS — M6281 Muscle weakness (generalized): Secondary | ICD-10-CM

## 2022-05-25 NOTE — Therapy (Signed)
OUTPATIENT PHYSICAL THERAPY LOWER EXTREMITY TREATMENT   Patient Name: [name] MRN: [id number] DOB:July 09, 1956, 66 y.o., female Today's Date: 05/25/2022   PT End of Session - 05/25/22 1504     Visit Number 2    Number of Visits 6    Date for PT Re-Evaluation 06/25/22    PT Start Time 1345    PT Stop Time 1420    PT Time Calculation (min) 35 min    Activity Tolerance Patient limited by pain;Patient limited by fatigue    Behavior During Therapy [facility] for tasks assessed/performed              Past Medical History:  Diagnosis Date   Acute MI, inferior wall, initial episode of care [facility]) 07/08/2012   Cath-07/08/11 -distal RCA stent DES (99%), LAD 20-30%. EF 50% inferior hypokinesis  (infarct)   Anxiety    Asthma    Coronary artery disease    Depression    Diabetes mellitus    Fibromyalgia    Leukocytosis    Monocytosis    Peripheral arterial disease (HCC) 01/23/2013   Tobacco use disorder 07/08/2012   Past Surgical History:  Procedure Laterality Date   ABDOMINAL AORTAGRAM N/A 02/07/2013   Procedure: ABDOMINAL Ronny Flurry;  Surgeon: Iran Ouch, MD;  Location: [facility] CATH LAB;  Service: Cardiovascular;  Laterality: N/A;   CARDIAC CATHETERIZATION     CORONARY STENT PLACEMENT     CYST REMOVAL TRUNK  1977   tailbone   LEFT HEART CATHETERIZATION WITH CORONARY ANGIOGRAM N/A 07/07/2012   Procedure: LEFT HEART CATHETERIZATION WITH CORONARY ANGIOGRAM;  Surgeon: Kathleene Hazel, MD;  Location: [facility] CATH LAB;  Service: Cardiovascular;  Laterality: N/A;   PERCUTANEOUS CORONARY STENT INTERVENTION (PCI-S)  07/07/2012   Procedure: PERCUTANEOUS CORONARY STENT INTERVENTION (PCI-S);  Surgeon: Kathleene Hazel, MD;  Location: [facility] CATH LAB;  Service: Cardiovascular;;   TONSILLECTOMY     Patient Active Problem List   Diagnosis Date Noted   Coronary artery disease involving native coronary artery of native heart without angina pectoris 12/23/2020   Type 2 diabetes mellitus  with complication, without long-term current use of insulin (HCC) 12/23/2020   Hyperlipidemia 01/31/2014   Pain in limb 12/26/2013   Disturbance of skin sensation 12/26/2013   Peripheral arterial disease (HCC) 01/23/2013   Leukocytosis    Monocytosis    Acute MI, inferior wall, initial episode of care (HCC) 07/08/2012   Tobacco use disorder 07/08/2012   Fibromyalgia 07/08/2012   Depression 07/08/2012   Diabetes mellitus type II, uncontrolled (HCC) 07/08/2012   REFERRING PROVIDER: Shirline Frees, NP  REFERRING DIAG: Gait instability  THERAPY DIAG:  Unsteadiness on feet  Muscle weakness (generalized)  Rationale for Evaluation and Treatment Rehabilitation  ONSET DATE: over 2 years ago  SUBJECTIVE:   SUBJECTIVE STATEMENT: Patient reports that she is hurting all over today.   PERTINENT HISTORY: Fibromyalgia, DM, chronic pain, depression, history of multiple UTI's, incontinence, history of MI, PAD, smoker  PAIN:  Are you having pain? Yes: NPRS scale: 7-8/10 Pain location: neck, back, and hips Pain description: sharp Aggravating factors: walking Relieving factors: massage or chiropractor  PRECAUTIONS: Fall  WEIGHT BEARING RESTRICTIONS No  FALLS:  Has patient fallen in last 6 months? Yes. Number of falls 20+; with most recent being about 20-30 days  LIVING ENVIRONMENT: Lives with: lives with their spouse Lives in: House/apartment Stairs: Yes: External: 4-5 steps; can reach both; step to pattern Has following equipment at home: Quad cane large base, Wheelchair (manual), Shower bench,  and Grab bars  OCCUPATION: disabled   PLOF: Independent with basic ADLs  PATIENT GOALS strengthening legs, stand longer than 5 minutes   OBJECTIVE: all objective measures were performed on 05/17/22 at her initial evaluation unless otherwise noted  COGNITION:  Overall cognitive status: Within functional limits for tasks assessed     SENSATION: Patient reports numbness in both hands  and feet  POSTURE: rounded shoulders, forward head, decreased lumbar lordosis, and flexed trunk   LOWER EXTREMITY ROM: limited bilateral knee extension  LOWER EXTREMITY MMT:  MMT Right eval Left eval  Hip flexion 3+/5 3/5  Hip extension    Hip abduction    Hip adduction    Hip internal rotation    Hip external rotation    Knee flexion 5/5 5/5  Knee extension 3+/5 4-/5 with pain radiating up to neck  Ankle dorsiflexion 4-/5 4-/5  Ankle plantarflexion    Ankle inversion    Ankle eversion     (Blank rows = not tested)  FUNCTIONAL TESTS:  5 times sit to stand: 29.28 seconds with UE support; feels SOB (96% SpO2) Timed up and go (TUG): 22.15 seconds with quad cane  GAIT: Assistive device utilized: Quad cane large base Level of assistance: Modified independence Comments: reduced step length, gait speed, and narrow BOS  Patient presented to treatment utilizing a wheelchair for mobility    TODAY'S TREATMENT:                                   8/29 EXERCISE LOG  Exercise Repetitions and Resistance Comments  Nustep L1 x 3 minutes Limited by pain  LAQ 15 reps each   Heel / toe raises  20 reps each   Seated hip ADD isometric  5 second hold x 15 reps   Seated clams No resistance x 15 reps    Seated hamstring isometric  5 second hold x 15 reps each   Toe tap on 6" step 15 reps each   Standing hip ABD  15 reps each   Standing hip flexion and extension 10 reps each    Blank cell = exercise not performed today    PATIENT EDUCATION:  Education details: benefits of pelvic health PT, prognosis  Person educated: Patient and Spouse Education method: Explanation Education comprehension: verbalized understanding   HOME EXERCISE PROGRAM:   ASSESSMENT:  CLINICAL IMPRESSION: Patient was introduced to multiple new interventions for improved lower extremity strength. She required minimal cueing throughout treatment with today's new interventions for proper exercise performance.  Fatigue and global pain were her primary limitations with today's interventions. She required multiple rest breaks throughout treatment due to fatigue. She reported feeling fatigued upon the conclusion of treatment. She continues to require skilled physical therapy to address her remaining impairments to maximize her safety and functional mobility.    OBJECTIVE IMPAIRMENTS Abnormal gait, decreased activity tolerance, decreased balance, decreased mobility, difficulty walking, decreased ROM, decreased strength, postural dysfunction, and pain.   ACTIVITY LIMITATIONS carrying, lifting, bending, standing, squatting, sleeping, stairs, transfers, and locomotion level  PARTICIPATION LIMITATIONS: meal prep, cleaning, laundry, shopping, and community activity  PERSONAL FACTORS Past/current experiences, Time since onset of injury/illness/exacerbation, Transportation, and 3+ comorbidities: Fibromyalgia, DM, chronic pain, depression, history of multiple UTI's, incontinence, history of MI, PAD, smoker  are also affecting patient's functional outcome.   REHAB POTENTIAL: Poor    CLINICAL DECISION MAKING: Unstable/unpredictable  EVALUATION COMPLEXITY: High   GOALS: Goals  reviewed with patient? No  LONG TERM GOALS: Target date: 06/07/2022   Patient will be independent with her HEP.  Baseline:  Goal status: INITIAL  2.  Patient will improve her TUG time to 15 seconds or less.  Baseline:  Goal status: INITIAL  3.  Patient will be able to improve her five time sit to stand to 20 seconds or less with upper extremity support for improved lower extremity strength and power.  Baseline:  Goal status: INITIAL  4.  Patient will report being able to walk for at least 10 minutes.  Baseline:  Goal status: INITIAL   PLAN: PT FREQUENCY: 2x/week  PT DURATION: 3 weeks  PLANNED INTERVENTIONS: Therapeutic exercises, Therapeutic activity, Neuromuscular re-education, Balance training, Gait training,  Patient/Family education, Self Care, Stair training, and Re-evaluation  PLAN FOR NEXT SESSION: nustep, seated LE strengthening    Granville Lewis, PT 05/25/2022, 3:06 PM

## 2022-05-27 ENCOUNTER — Ambulatory Visit: Payer: Medicare Other

## 2022-05-27 DIAGNOSIS — R2681 Unsteadiness on feet: Secondary | ICD-10-CM | POA: Diagnosis not present

## 2022-05-27 DIAGNOSIS — M6281 Muscle weakness (generalized): Secondary | ICD-10-CM | POA: Diagnosis not present

## 2022-05-27 NOTE — Therapy (Signed)
OUTPATIENT PHYSICAL THERAPY LOWER EXTREMITY TREATMENT   Patient Name: [name] MRN: [id number] DOB:25-Apr-1956, 66 y.o., female Today's Date: 05/27/2022   PT End of Session - 05/27/22 1351     Visit Number 3    Number of Visits 6    Date for PT Re-Evaluation 06/25/22    PT Start Time 1348    PT Stop Time 1415    PT Time Calculation (min) 27 min    Activity Tolerance Patient limited by pain;Patient limited by fatigue    Behavior During Therapy [facility] for tasks assessed/performed               Past Medical History:  Diagnosis Date   Acute MI, inferior wall, initial episode of care [facility]) 07/08/2012   Cath-07/08/11 -distal RCA stent DES (99%), LAD 20-30%. EF 50% inferior hypokinesis  (infarct)   Anxiety    Asthma    Coronary artery disease    Depression    Diabetes mellitus    Fibromyalgia    Leukocytosis    Monocytosis    Peripheral arterial disease (HCC) 01/23/2013   Tobacco use disorder 07/08/2012   Past Surgical History:  Procedure Laterality Date   ABDOMINAL AORTAGRAM N/A 02/07/2013   Procedure: ABDOMINAL Ronny Flurry;  Surgeon: Iran Ouch, MD;  Location: [facility] CATH LAB;  Service: Cardiovascular;  Laterality: N/A;   CARDIAC CATHETERIZATION     CORONARY STENT PLACEMENT     CYST REMOVAL TRUNK  1977   tailbone   LEFT HEART CATHETERIZATION WITH CORONARY ANGIOGRAM N/A 07/07/2012   Procedure: LEFT HEART CATHETERIZATION WITH CORONARY ANGIOGRAM;  Surgeon: Kathleene Hazel, MD;  Location: [facility] CATH LAB;  Service: Cardiovascular;  Laterality: N/A;   PERCUTANEOUS CORONARY STENT INTERVENTION (PCI-S)  07/07/2012   Procedure: PERCUTANEOUS CORONARY STENT INTERVENTION (PCI-S);  Surgeon: Kathleene Hazel, MD;  Location: [facility] CATH LAB;  Service: Cardiovascular;;   TONSILLECTOMY     Patient Active Problem List   Diagnosis Date Noted   Coronary artery disease involving native coronary artery of native heart without angina pectoris 12/23/2020   Type 2 diabetes mellitus  with complication, without long-term current use of insulin (HCC) 12/23/2020   Hyperlipidemia 01/31/2014   Pain in limb 12/26/2013   Disturbance of skin sensation 12/26/2013   Peripheral arterial disease (HCC) 01/23/2013   Leukocytosis    Monocytosis    Acute MI, inferior wall, initial episode of care (HCC) 07/08/2012   Tobacco use disorder 07/08/2012   Fibromyalgia 07/08/2012   Depression 07/08/2012   Diabetes mellitus type II, uncontrolled (HCC) 07/08/2012   REFERRING PROVIDER: Shirline Frees, NP  REFERRING DIAG: Gait instability  THERAPY DIAG:  Unsteadiness on feet  Muscle weakness (generalized)  Rationale for Evaluation and Treatment Rehabilitation  ONSET DATE: over 2 years ago  SUBJECTIVE:   SUBJECTIVE STATEMENT: Patient reports that her legs felt alright after her last appointment. However, they were really sore the next day.   PERTINENT HISTORY: Fibromyalgia, DM, chronic pain, depression, history of multiple UTI's, incontinence, history of MI, PAD, smoker  PAIN:  Are you having pain? Yes: NPRS scale: 7-8/10 Pain location: neck, back, and hips Pain description: sharp Aggravating factors: walking Relieving factors: massage or chiropractor  PRECAUTIONS: Fall  WEIGHT BEARING RESTRICTIONS No  FALLS:  Has patient fallen in last 6 months? Yes. Number of falls 20+; with most recent being about 20-30 days  LIVING ENVIRONMENT: Lives with: lives with their spouse Lives in: House/apartment Stairs: Yes: External: 4-5 steps; can reach both; step to pattern Has following  equipment at home: Chiropractor, Wheelchair (manual), Tour manager, and Grab bars  OCCUPATION: disabled   PLOF: Independent with basic ADLs  PATIENT GOALS strengthening legs, stand longer than 5 minutes   OBJECTIVE: all objective measures were performed on 05/17/22 at her initial evaluation unless otherwise noted  COGNITION:  Overall cognitive status: Within functional limits for tasks  assessed     SENSATION: Patient reports numbness in both hands and feet  POSTURE: rounded shoulders, forward head, decreased lumbar lordosis, and flexed trunk   LOWER EXTREMITY ROM: limited bilateral knee extension  LOWER EXTREMITY MMT:  MMT Right eval Left eval  Hip flexion 3+/5 3/5  Hip extension    Hip abduction    Hip adduction    Hip internal rotation    Hip external rotation    Knee flexion 5/5 5/5  Knee extension 3+/5 4-/5 with pain radiating up to neck  Ankle dorsiflexion 4-/5 4-/5  Ankle plantarflexion    Ankle inversion    Ankle eversion     (Blank rows = not tested)  FUNCTIONAL TESTS:  5 times sit to stand: 29.28 seconds with UE support; feels SOB (96% SpO2) Timed up and go (TUG): 22.15 seconds with quad cane  GAIT: Assistive device utilized: Quad cane large base Level of assistance: Modified independence Comments: reduced step length, gait speed, and narrow BOS  Patient presented to treatment utilizing a wheelchair for mobility    TODAY'S TREATMENT:                                   8/31 EXERCISE LOG  Exercise Repetitions and Resistance Comments  Nustep  L1 x 10 minutes   Rocker board 1 minute   Marching  20 reps each   Standing hip abduction 10 reps each   Standing hip flexion 10 reps each   Standing knee flexion 15 reps each   LAQ  15 reps each   Seated hip ADD isometric  5 second hold x 15 reps   Seated heel slide  30 reps each    Blank cell = exercise not performed today                                    8/29 EXERCISE LOG  Exercise Repetitions and Resistance Comments  Nustep L1 x 3 minutes Limited by pain  LAQ 15 reps each   Heel / toe raises  20 reps each   Seated hip ADD isometric  5 second hold x 15 reps   Seated clams No resistance x 15 reps    Seated hamstring isometric  5 second hold x 15 reps each   Toe tap on 6" step 15 reps each   Standing hip ABD  15 reps each   Standing hip flexion and extension 10 reps each    Blank cell  = exercise not performed today    PATIENT EDUCATION:  Education details: benefits of pelvic health PT, prognosis  Person educated: Patient and Spouse Education method: Explanation Education comprehension: verbalized understanding   HOME EXERCISE PROGRAM:   ASSESSMENT:  CLINICAL IMPRESSION: Treatment focused on familiar interventions for improved lower extremity. She presented with high pain severity, irritability, and fatigue which limited her ability to complete today's treatment. She reported that she was was hurting and "going to pay for this later" upon the conclusion  of treatment. She continues to require skilled physical therapy to address her remaining impairments to maximize her functional mobility and safety.    OBJECTIVE IMPAIRMENTS Abnormal gait, decreased activity tolerance, decreased balance, decreased mobility, difficulty walking, decreased ROM, decreased strength, postural dysfunction, and pain.   ACTIVITY LIMITATIONS carrying, lifting, bending, standing, squatting, sleeping, stairs, transfers, and locomotion level  PARTICIPATION LIMITATIONS: meal prep, cleaning, laundry, shopping, and community activity  PERSONAL FACTORS Past/current experiences, Time since onset of injury/illness/exacerbation, Transportation, and 3+ comorbidities: Fibromyalgia, DM, chronic pain, depression, history of multiple UTI's, incontinence, history of MI, PAD, smoker  are also affecting patient's functional outcome.   REHAB POTENTIAL: Poor    CLINICAL DECISION MAKING: Unstable/unpredictable  EVALUATION COMPLEXITY: High   GOALS: Goals reviewed with patient? No  LONG TERM GOALS: Target date: 06/07/2022   Patient will be independent with her HEP.  Baseline:  Goal status: INITIAL  2.  Patient will improve her TUG time to 15 seconds or less.  Baseline:  Goal status: INITIAL  3.  Patient will be able to improve her five time sit to stand to 20 seconds or less with upper extremity  support for improved lower extremity strength and power.  Baseline:  Goal status: INITIAL  4.  Patient will report being able to walk for at least 10 minutes.  Baseline:  Goal status: INITIAL   PLAN: PT FREQUENCY: 2x/week  PT DURATION: 3 weeks  PLANNED INTERVENTIONS: Therapeutic exercises, Therapeutic activity, Neuromuscular re-education, Balance training, Gait training, Patient/Family education, Self Care, Stair training, and Re-evaluation  PLAN FOR NEXT SESSION: nustep, seated LE strengthening    Granville Lewis, PT 05/27/2022, 2:23 PM

## 2022-05-28 ENCOUNTER — Telehealth: Payer: Self-pay | Admitting: Pharmacist

## 2022-05-28 NOTE — Chronic Care Management (AMB) (Signed)
    Chronic Care Management Pharmacy Assistant  error 

## 2022-06-01 ENCOUNTER — Other Ambulatory Visit: Payer: Medicare Other

## 2022-06-03 ENCOUNTER — Ambulatory Visit: Payer: Medicare Other | Attending: Adult Health | Admitting: *Deleted

## 2022-06-03 ENCOUNTER — Other Ambulatory Visit (INDEPENDENT_AMBULATORY_CARE_PROVIDER_SITE_OTHER): Payer: Medicare Other

## 2022-06-03 ENCOUNTER — Encounter: Payer: Self-pay | Admitting: *Deleted

## 2022-06-03 DIAGNOSIS — M6281 Muscle weakness (generalized): Secondary | ICD-10-CM | POA: Insufficient documentation

## 2022-06-03 DIAGNOSIS — D72829 Elevated white blood cell count, unspecified: Secondary | ICD-10-CM | POA: Diagnosis not present

## 2022-06-03 DIAGNOSIS — R2681 Unsteadiness on feet: Secondary | ICD-10-CM | POA: Insufficient documentation

## 2022-06-03 NOTE — Therapy (Signed)
OUTPATIENT PHYSICAL THERAPY LOWER EXTREMITY TREATMENT   Patient Name: [name] MRN: [id number] DOB:08-20-1956, 66 y.o., female Today's Date: 06/03/2022   PT End of Session - 06/03/22 1357     Visit Number 4    Number of Visits 6    Date for PT Re-Evaluation 06/25/22    PT Start Time 1353    PT Stop Time 1432    PT Time Calculation (min) 39 min               Past Medical History:  Diagnosis Date   Acute MI, inferior wall, initial episode of care (HCC) 07/08/2012   Cath-07/08/11 -distal RCA stent DES (99%), LAD 20-30%. EF 50% inferior hypokinesis  (infarct)   Anxiety    Asthma    Coronary artery disease    Depression    Diabetes mellitus    Fibromyalgia    Leukocytosis    Monocytosis    Peripheral arterial disease (HCC) 01/23/2013   Tobacco use disorder 07/08/2012   Past Surgical History:  Procedure Laterality Date   ABDOMINAL AORTAGRAM N/A 02/07/2013   Procedure: ABDOMINAL Ronny Flurry;  Surgeon: Iran Ouch, MD;  Location: [facility] CATH LAB;  Service: Cardiovascular;  Laterality: N/A;   CARDIAC CATHETERIZATION     CORONARY STENT PLACEMENT     CYST REMOVAL TRUNK  1977   tailbone   LEFT HEART CATHETERIZATION WITH CORONARY ANGIOGRAM N/A 07/07/2012   Procedure: LEFT HEART CATHETERIZATION WITH CORONARY ANGIOGRAM;  Surgeon: Kathleene Hazel, MD;  Location: [facility] CATH LAB;  Service: Cardiovascular;  Laterality: N/A;   PERCUTANEOUS CORONARY STENT INTERVENTION (PCI-S)  07/07/2012   Procedure: PERCUTANEOUS CORONARY STENT INTERVENTION (PCI-S);  Surgeon: Kathleene Hazel, MD;  Location: [facility] CATH LAB;  Service: Cardiovascular;;   TONSILLECTOMY     Patient Active Problem List   Diagnosis Date Noted   Coronary artery disease involving native coronary artery of native heart without angina pectoris 12/23/2020   Type 2 diabetes mellitus with complication, without long-term current use of insulin (HCC) 12/23/2020   Hyperlipidemia 01/31/2014   Pain in limb 12/26/2013    Disturbance of skin sensation 12/26/2013   Peripheral arterial disease (HCC) 01/23/2013   Leukocytosis    Monocytosis    Acute MI, inferior wall, initial episode of care (HCC) 07/08/2012   Tobacco use disorder 07/08/2012   Fibromyalgia 07/08/2012   Depression 07/08/2012   Diabetes mellitus type II, uncontrolled (HCC) 07/08/2012   REFERRING PROVIDER: Shirline Frees, NP  REFERRING DIAG: Gait instability  THERAPY DIAG:  Unsteadiness on feet  Muscle weakness (generalized)  Rationale for Evaluation and Treatment Rehabilitation  ONSET DATE: over 2 years ago  SUBJECTIVE:   SUBJECTIVE STATEMENT: Patient reports her hip is hurting a lot today  PERTINENT HISTORY: Fibromyalgia, DM, chronic pain, depression, history of multiple UTI's, incontinence, history of MI, PAD, smoker  PAIN:  Are you having pain? Yes: NPRS scale: 7-8/10 Pain location: neck, back, and hips Pain description: sharp Aggravating factors: walking Relieving factors: massage or chiropractor  PRECAUTIONS: Fall  WEIGHT BEARING RESTRICTIONS No  FALLS:  Has patient fallen in last 6 months? Yes. Number of falls 20+; with most recent being about 20-30 days  LIVING ENVIRONMENT: Lives with: lives with their spouse Lives in: House/apartment Stairs: Yes: External: 4-5 steps; can reach both; step to pattern Has following equipment at home: Quad cane large base, Wheelchair (manual), Shower bench, and Grab bars  OCCUPATION: disabled   PLOF: Independent with basic ADLs  PATIENT GOALS strengthening legs, stand longer than 5  minutes   OBJECTIVE: all objective measures were performed on 05/17/22 at her initial evaluation unless otherwise noted  COGNITION:  Overall cognitive status: Within functional limits for tasks assessed     SENSATION: Patient reports numbness in both hands and feet  POSTURE: rounded shoulders, forward head, decreased lumbar lordosis, and flexed trunk   LOWER EXTREMITY ROM: limited bilateral  knee extension  LOWER EXTREMITY MMT:  MMT Right eval Left eval  Hip flexion 3+/5 3/5  Hip extension    Hip abduction    Hip adduction    Hip internal rotation    Hip external rotation    Knee flexion 5/5 5/5  Knee extension 3+/5 4-/5 with pain radiating up to neck  Ankle dorsiflexion 4-/5 4-/5  Ankle plantarflexion    Ankle inversion    Ankle eversion     (Blank rows = not tested)  FUNCTIONAL TESTS:  5 times sit to stand: 29.28 seconds with UE support; feels SOB (96% SpO2) Timed up and go (TUG): 22.15 seconds with quad cane  GAIT: Assistive device utilized: Quad cane large base Level of assistance: Modified independence Comments: reduced step length, gait speed, and narrow BOS  Patient presented to treatment utilizing a wheelchair for mobility    TODAY'S TREATMENT:                                   9-7 EXERCISE LOG  Exercise Repetitions and Resistance Comments  Nustep  L1 x 4 minutes   Heel ups/toe ups 2x10 each   Rocker board X 3 mins DF/PF and balance with CGA   Marching  3x10 reps each   Standing hip abduction    Standing hip flexion 10 reps each   Seated knee flexion 10 reps each red tband   LAQ  2# 2x10  reps each   Seated clam shell  Red tband x15 hold 2-3 secs   Seated hip ADD isometric    Ball squeeze 10 second hold x 10 reps   Seated heel slide      Blank cell = exercise not performed today                                    8/29 EXERCISE LOG  Exercise Repetitions and Resistance Comments  Nustep L1 x 3 minutes Limited by pain  LAQ 15 reps each   Heel / toe raises  20 reps each   Seated hip ADD isometric  5 second hold x 15 reps   Seated clams No resistance x 15 reps    Seated hamstring isometric  5 second hold x 15 reps each   Toe tap on 6" step 15 reps each   Standing hip ABD  15 reps each   Standing hip flexion and extension 10 reps each    Blank cell = exercise not performed today    PATIENT EDUCATION:  Education details: benefits of  pelvic health PT, prognosis  Person educated: Patient and Spouse Education method: Explanation Education comprehension: verbalized understanding   HOME EXERCISE PROGRAM:   ASSESSMENT:  CLINICAL IMPRESSION: Pt arrived today doing fair with mainly RT hip pain. She was able to perform standing and seated LE exs today as well as some balance act's. Exs ended due to pain, but good progression today. LTG's on-going due to pain  OBJECTIVE IMPAIRMENTS Abnormal gait, decreased  activity tolerance, decreased balance, decreased mobility, difficulty walking, decreased ROM, decreased strength, postural dysfunction, and pain.   ACTIVITY LIMITATIONS carrying, lifting, bending, standing, squatting, sleeping, stairs, transfers, and locomotion level  PARTICIPATION LIMITATIONS: meal prep, cleaning, laundry, shopping, and community activity  PERSONAL FACTORS Past/current experiences, Time since onset of injury/illness/exacerbation, Transportation, and 3+ comorbidities: Fibromyalgia, DM, chronic pain, depression, history of multiple UTI's, incontinence, history of MI, PAD, smoker  are also affecting patient's functional outcome.   REHAB POTENTIAL: Poor    CLINICAL DECISION MAKING: Unstable/unpredictable  EVALUATION COMPLEXITY: High   GOALS: Goals reviewed with patient? No  LONG TERM GOALS: Target date: 06/07/2022   Patient will be independent with her HEP.  Baseline:  Goal status: Ongoing  2.  Patient will improve her TUG time to 15 seconds or less.  Baseline:  Goal status: Ongoing  3.  Patient will be able to improve her five time sit to stand to 20 seconds or less with upper extremity support for improved lower extremity strength and power.  Baseline:  Goal status:Ongoing  4.  Patient will report being able to walk for at least 10 minutes.  Baseline:  Goal status: ongoing   PLAN: PT FREQUENCY: 2x/week  PT DURATION: 3 weeks  PLANNED INTERVENTIONS: Therapeutic exercises, Therapeutic  activity, Neuromuscular re-education, Balance training, Gait training, Patient/Family education, Self Care, Stair training, and Re-evaluation  PLAN FOR NEXT SESSION: nustep, seated LE strengthening    Ahmani Prehn,CHRIS, PTA 06/03/2022, 2:51 PM

## 2022-06-04 LAB — COMPREHENSIVE METABOLIC PANEL
ALT: 7 U/L (ref 0–35)
AST: 13 U/L (ref 0–37)
Albumin: 3.5 g/dL (ref 3.5–5.2)
Alkaline Phosphatase: 57 U/L (ref 39–117)
BUN: 15 mg/dL (ref 6–23)
CO2: 24 mEq/L (ref 19–32)
Calcium: 9.3 mg/dL (ref 8.4–10.5)
Chloride: 105 mEq/L (ref 96–112)
Creatinine, Ser: 1.33 mg/dL — ABNORMAL HIGH (ref 0.40–1.20)
GFR: 42.35 mL/min — ABNORMAL LOW (ref >60.00)
Glucose, Bld: 168 mg/dL — ABNORMAL HIGH (ref 70–99)
Potassium: 4 mEq/L (ref 3.5–5.1)
Sodium: 139 mEq/L (ref 135–145)
Total Bilirubin: 0.3 mg/dL (ref 0.2–1.2)
Total Protein: 6.3 g/dL (ref 6.0–8.3)

## 2022-06-04 LAB — CBC WITH DIFFERENTIAL/PLATELET
Basophils Absolute: 0.2 10*3/uL — ABNORMAL HIGH (ref 0.0–0.1)
Basophils Relative: 1.3 % (ref 0.0–3.0)
Eosinophils Absolute: 0.3 10*3/uL (ref 0.0–0.7)
Eosinophils Relative: 2.4 % (ref 0.0–5.0)
HCT: 40.1 % (ref 36.0–46.0)
Hemoglobin: 13.1 g/dL (ref 12.0–15.0)
Lymphocytes Relative: 24.1 % (ref 12.0–46.0)
Lymphs Abs: 3.1 10*3/uL (ref 0.7–4.0)
MCHC: 32.7 g/dL (ref 30.0–36.0)
MCV: 94.3 fl (ref 78.0–100.0)
Monocytes Absolute: 1.1 10*3/uL — ABNORMAL HIGH (ref 0.1–1.0)
Monocytes Relative: 8.1 % (ref 3.0–12.0)
Neutro Abs: 8.3 10*3/uL — ABNORMAL HIGH (ref 1.4–7.7)
Neutrophils Relative %: 64.1 % (ref 43.0–77.0)
Platelets: 280 10*3/uL (ref 150.0–400.0)
RBC: 4.25 Mil/uL (ref 3.87–5.11)
RDW: 15.1 % (ref 11.5–15.5)
WBC: 13 10*3/uL — ABNORMAL HIGH (ref 4.0–10.5)

## 2022-06-08 ENCOUNTER — Encounter: Payer: Self-pay | Admitting: *Deleted

## 2022-06-08 ENCOUNTER — Telehealth: Payer: Self-pay | Admitting: Pharmacist

## 2022-06-08 ENCOUNTER — Ambulatory Visit: Payer: Medicare Other | Admitting: *Deleted

## 2022-06-08 DIAGNOSIS — R2681 Unsteadiness on feet: Secondary | ICD-10-CM | POA: Diagnosis not present

## 2022-06-08 DIAGNOSIS — M6281 Muscle weakness (generalized): Secondary | ICD-10-CM | POA: Diagnosis not present

## 2022-06-08 NOTE — Telephone Encounter (Signed)
Called patient to let her know that her [employer] patient assistance has arrived. Patient verbalized her understanding and will likely come pick it up tomorrow.

## 2022-06-08 NOTE — Therapy (Signed)
OUTPATIENT PHYSICAL THERAPY LOWER EXTREMITY TREATMENT   Patient Name: [name] MRN: [id number] DOB:Dec 25, 1955, 66 y.o., female Today's Date: 06/08/2022   PT End of Session - 06/08/22 1404     Visit Number 5    Number of Visits 6    Date for PT Re-Evaluation 06/25/22    PT Start Time 1356    PT Stop Time 1430    PT Time Calculation (min) 34 min               Past Medical History:  Diagnosis Date   Acute MI, inferior wall, initial episode of care (HCC) 07/08/2012   Cath-07/08/11 -distal RCA stent DES (99%), LAD 20-30%. EF 50% inferior hypokinesis  (infarct)   Anxiety    Asthma    Coronary artery disease    Depression    Diabetes mellitus    Fibromyalgia    Leukocytosis    Monocytosis    Peripheral arterial disease (HCC) 01/23/2013   Tobacco use disorder 07/08/2012   Past Surgical History:  Procedure Laterality Date   ABDOMINAL AORTAGRAM N/A 02/07/2013   Procedure: ABDOMINAL Ronny Flurry;  Surgeon: Iran Ouch, MD;  Location: [facility] CATH LAB;  Service: Cardiovascular;  Laterality: N/A;   CARDIAC CATHETERIZATION     CORONARY STENT PLACEMENT     CYST REMOVAL TRUNK  1977   tailbone   LEFT HEART CATHETERIZATION WITH CORONARY ANGIOGRAM N/A 07/07/2012   Procedure: LEFT HEART CATHETERIZATION WITH CORONARY ANGIOGRAM;  Surgeon: Kathleene Hazel, MD;  Location: [facility] CATH LAB;  Service: Cardiovascular;  Laterality: N/A;   PERCUTANEOUS CORONARY STENT INTERVENTION (PCI-S)  07/07/2012   Procedure: PERCUTANEOUS CORONARY STENT INTERVENTION (PCI-S);  Surgeon: Kathleene Hazel, MD;  Location: [facility] CATH LAB;  Service: Cardiovascular;;   TONSILLECTOMY     Patient Active Problem List   Diagnosis Date Noted   Coronary artery disease involving native coronary artery of native heart without angina pectoris 12/23/2020   Type 2 diabetes mellitus with complication, without long-term current use of insulin (HCC) 12/23/2020   Hyperlipidemia 01/31/2014   Pain in limb 12/26/2013    Disturbance of skin sensation 12/26/2013   Peripheral arterial disease (HCC) 01/23/2013   Leukocytosis    Monocytosis    Acute MI, inferior wall, initial episode of care (HCC) 07/08/2012   Tobacco use disorder 07/08/2012   Fibromyalgia 07/08/2012   Depression 07/08/2012   Diabetes mellitus type II, uncontrolled (HCC) 07/08/2012   REFERRING PROVIDER: Shirline Frees, NP  REFERRING DIAG: Gait instability  THERAPY DIAG:  Unsteadiness on feet  Muscle weakness (generalized)  Rationale for Evaluation and Treatment Rehabilitation  ONSET DATE: over 2 years ago  SUBJECTIVE:   SUBJECTIVE STATEMENT: Patient reports her quads are hurting a lot today  PERTINENT HISTORY: Fibromyalgia, DM, chronic pain, depression, history of multiple UTI's, incontinence, history of MI, PAD, smoker  PAIN:  Are you having pain? Yes: NPRS scale: 7-8/10 Pain location: neck, back, and hips Pain description: sharp Aggravating factors: walking Relieving factors: massage or chiropractor  PRECAUTIONS: Fall  WEIGHT BEARING RESTRICTIONS No  FALLS:  Has patient fallen in last 6 months? Yes. Number of falls 20+; with most recent being about 20-30 days  LIVING ENVIRONMENT: Lives with: lives with their spouse Lives in: House/apartment Stairs: Yes: External: 4-5 steps; can reach both; step to pattern Has following equipment at home: Quad cane large base, Wheelchair (manual), Shower bench, and Grab bars  OCCUPATION: disabled   PLOF: Independent with basic ADLs  PATIENT GOALS strengthening legs, stand longer than 5  minutes   OBJECTIVE: all objective measures were performed on 05/17/22 at her initial evaluation unless otherwise noted  COGNITION:  Overall cognitive status: Within functional limits for tasks assessed     SENSATION: Patient reports numbness in both hands and feet  POSTURE: rounded shoulders, forward head, decreased lumbar lordosis, and flexed trunk   LOWER EXTREMITY ROM: limited  bilateral knee extension  LOWER EXTREMITY MMT:  MMT Right eval Left eval  Hip flexion 3+/5 3/5  Hip extension    Hip abduction    Hip adduction    Hip internal rotation    Hip external rotation    Knee flexion 5/5 5/5  Knee extension 3+/5 4-/5 with pain radiating up to neck  Ankle dorsiflexion 4-/5 4-/5  Ankle plantarflexion    Ankle inversion    Ankle eversion     (Blank rows = not tested)  FUNCTIONAL TESTS:  5 times sit to stand: 29.28 seconds with UE support; feels SOB (96% SpO2) Timed up and go (TUG): 22.15 seconds with quad cane  GAIT: Assistive device utilized: Quad cane large base Level of assistance: Modified independence Comments: reduced step length, gait speed, and narrow BOS  Patient presented to treatment utilizing a wheelchair for mobility    TODAY'S TREATMENT:                                   9-12    EXERCISE LOG  Exercise Repetitions and Resistance Comments  Nustep  L1 x 7.5 minutes   Heel ups/toe ups 2x10 each   Rocker board    Marching seated 2x10 reps each   Sit to stand X 5 UE assist   Standing hip abduction    Standing hip flexion    Seated knee flexion 10 reps each red tband   LAQ  2# 2x10  reps each   Seated clam shell  Red tband x15 hold 2-3 secs   Seated hip ADD isometric    Ball squeeze 10 second hold x 10 reps   Seated heel slide      Blank cell = exercise not performed today                                    8/29 EXERCISE LOG  Exercise Repetitions and Resistance Comments  Nustep L1 x 3 minutes Limited by pain  LAQ 15 reps each   Heel / toe raises  20 reps each   Seated hip ADD isometric  5 second hold x 15 reps   Seated clams No resistance x 15 reps    Seated hamstring isometric  5 second hold x 15 reps each   Toe tap on 6" step 15 reps each   Standing hip ABD  15 reps each   Standing hip flexion and extension 10 reps each    Blank cell = exercise not performed today    PATIENT EDUCATION:  Education details: benefits of  pelvic health PT, prognosis  Person educated: Patient and Spouse Education method: Explanation Education comprehension: verbalized understanding   HOME EXERCISE PROGRAM:   ASSESSMENT:  CLINICAL IMPRESSION: Pt arrived today doing fair mainly  with quads burning/stinging. She wasn't  able to perform as many standing exs today due to pain in her quads, but did well with sitting exs. She was able to perform sit to stand x 5 ,  but still needs UE assist.     OBJECTIVE IMPAIRMENTS Abnormal gait, decreased activity tolerance, decreased balance, decreased mobility, difficulty walking, decreased ROM, decreased strength, postural dysfunction, and pain.   ACTIVITY LIMITATIONS carrying, lifting, bending, standing, squatting, sleeping, stairs, transfers, and locomotion level  PARTICIPATION LIMITATIONS: meal prep, cleaning, laundry, shopping, and community activity  PERSONAL FACTORS Past/current experiences, Time since onset of injury/illness/exacerbation, Transportation, and 3+ comorbidities: Fibromyalgia, DM, chronic pain, depression, history of multiple UTI's, incontinence, history of MI, PAD, smoker  are also affecting patient's functional outcome.   REHAB POTENTIAL: Poor    CLINICAL DECISION MAKING: Unstable/unpredictable  EVALUATION COMPLEXITY: High   GOALS: Goals reviewed with patient? No  LONG TERM GOALS: Target date: 06/07/2022   Patient will be independent with her HEP.  Baseline:  Goal status: Ongoing  2.  Patient will improve her TUG time to 15 seconds or less.  Baseline:  Goal status: Ongoing  3.  Patient will be able to improve her five time sit to stand to 20 seconds or less with upper extremity support for improved lower extremity strength and power.  Baseline:  Goal status:Ongoing  4.  Patient will report being able to walk for at least 10 minutes.  Baseline:  Goal status: ongoing   PLAN: PT FREQUENCY: 2x/week  PT DURATION: 3 weeks  PLANNED INTERVENTIONS:  Therapeutic exercises, Therapeutic activity, Neuromuscular re-education, Balance training, Gait training, Patient/Family education, Self Care, Stair training, and Re-evaluation  PLAN FOR NEXT SESSION: nustep, seated LE strengthening. Check LTGs with possible DC to HEP   Catrena Vari,CHRIS, PTA 06/08/2022, 3:38 PM

## 2022-06-10 ENCOUNTER — Ambulatory Visit: Payer: Medicare Other | Admitting: *Deleted

## 2022-06-10 ENCOUNTER — Encounter: Payer: Self-pay | Admitting: *Deleted

## 2022-06-10 DIAGNOSIS — R2681 Unsteadiness on feet: Secondary | ICD-10-CM | POA: Diagnosis not present

## 2022-06-10 DIAGNOSIS — M6281 Muscle weakness (generalized): Secondary | ICD-10-CM

## 2022-06-10 NOTE — Therapy (Signed)
OUTPATIENT PHYSICAL THERAPY LOWER EXTREMITY TREATMENT   Patient Name: [name] MRN: [id number] DOB:01/05/1956, 66 y.o., female Today's Date: 06/10/2022   PT End of Session - 06/10/22 1351     Visit Number 6    Number of Visits 6    Date for PT Re-Evaluation 06/25/22    PT Start Time 7412    PT Stop Time 1417    PT Time Calculation (min) 32 min               Past Medical History:  Diagnosis Date   Acute MI, inferior wall, initial episode of care ([place]) 07/08/2012   Cath-07/08/11 -distal RCA stent DES (99%), LAD 20-30%. EF 50% inferior hypokinesis  (infarct)   Anxiety    Asthma    Coronary artery disease    Depression    Diabetes mellitus    Fibromyalgia    Leukocytosis    Monocytosis    Peripheral arterial disease ([place]) 01/23/2013   Tobacco use disorder 07/08/2012   Past Surgical History:  Procedure Laterality Date   ABDOMINAL AORTAGRAM N/A 02/07/2013   Procedure: ABDOMINAL Maxcine Ham;  Surgeon: Wellington Hampshire, MD;  Location: [facility] CATH LAB;  Service: Cardiovascular;  Laterality: N/A;   CARDIAC CATHETERIZATION     CORONARY STENT PLACEMENT     CYST REMOVAL TRUNK  1977   tailbone   LEFT HEART CATHETERIZATION WITH CORONARY ANGIOGRAM N/A 07/07/2012   Procedure: LEFT HEART CATHETERIZATION WITH CORONARY ANGIOGRAM;  Surgeon: Burnell Blanks, MD;  Location: [facility] CATH LAB;  Service: Cardiovascular;  Laterality: N/A;   PERCUTANEOUS CORONARY STENT INTERVENTION (PCI-S)  07/07/2012   Procedure: PERCUTANEOUS CORONARY STENT INTERVENTION (PCI-S);  Surgeon: Burnell Blanks, MD;  Location: [facility] CATH LAB;  Service: Cardiovascular;;   TONSILLECTOMY     Patient Active Problem List   Diagnosis Date Noted   Coronary artery disease involving native coronary artery of native heart without angina pectoris 12/23/2020   Type 2 diabetes mellitus with complication, without long-term current use of insulin ([place]) 12/23/2020   Hyperlipidemia 01/31/2014   Pain in limb 12/26/2013    Disturbance of skin sensation 12/26/2013   Peripheral arterial disease ([place]) 01/23/2013   Leukocytosis    Monocytosis    Acute MI, inferior wall, initial episode of care ([place]) 07/08/2012   Tobacco use disorder 07/08/2012   Fibromyalgia 07/08/2012   Depression 07/08/2012   Diabetes mellitus type II, uncontrolled ([place]) 07/08/2012   REFERRING PROVIDER: [name], NP  REFERRING DIAG: Gait instability  THERAPY DIAG:  Unsteadiness on feet  Muscle weakness (generalized)  Rationale for Evaluation and Treatment Rehabilitation  ONSET DATE: over 2 years ago  SUBJECTIVE:   SUBJECTIVE STATEMENT: Patient reports her sciatica is acting up today PERTINENT HISTORY: Fibromyalgia, DM, chronic pain, depression, history of multiple UTI's, incontinence, history of MI, PAD, smoker  PAIN:  Are you having pain? Yes: NPRS scale: 7/10 Pain location: neck, back, and hips Pain description: sharp Aggravating factors: walking Relieving factors: massage or chiropractor  PRECAUTIONS: Fall  WEIGHT BEARING RESTRICTIONS No  FALLS:  Has patient fallen in last 6 months? Yes. Number of falls 20+; with most recent being about 20-30 days  LIVING ENVIRONMENT: Lives with: lives with their spouse Lives in: House/apartment Stairs: Yes: External: 4-5 steps; can reach both; step to pattern Has following equipment at home: Quad cane large base, Wheelchair (manual), Shower bench, and Grab bars  OCCUPATION: disabled   PLOF: Independent with basic ADLs  PATIENT GOALS strengthening legs, stand longer than 5 minutes  OBJECTIVE: all objective measures were performed on 05/17/22 at her initial evaluation unless otherwise noted  COGNITION:  Overall cognitive status: Within functional limits for tasks assessed     SENSATION: Patient reports numbness in both hands and feet  POSTURE: rounded shoulders, forward head, decreased lumbar lordosis, and flexed trunk   LOWER EXTREMITY ROM: limited bilateral  knee extension  LOWER EXTREMITY MMT:  MMT Right eval Left eval  Hip flexion 3+/5 3/5  Hip extension    Hip abduction    Hip adduction    Hip internal rotation    Hip external rotation    Knee flexion 5/5 5/5  Knee extension 3+/5 4-/5 with pain radiating up to neck  Ankle dorsiflexion 4-/5 4-/5  Ankle plantarflexion    Ankle inversion    Ankle eversion     (Blank rows = not tested)  FUNCTIONAL TESTS:  5 times sit to stand: 29.28 seconds with UE support; feels SOB (96% SpO2) Timed up and go (TUG): 22.15 seconds with quad cane  GAIT: Assistive device utilized: Quad cane large base Level of assistance: Modified independence Comments: reduced step length, gait speed, and narrow BOS  Patient presented to treatment utilizing a wheelchair for mobility    TODAY'S TREATMENT:                                   9-14    EXERCISE LOG  Exercise Repetitions and Resistance Comments  Nustep  L1 x 7 minutes   Heel ups/toe ups 2x10 each   Rocker board    Marching seated 2x10 reps each   Sit to stand X 5 UE assist   timed   TUG    Standing hip abduction    Standing hip flexion    Seated knee flexion    LAQ  2# 2x10  reps each LE   Seated clam shell  Red tband x15 hold 2-3 secs   Seated hip ADD isometric    Ball squeeze 10 second hold x 10 reps   Seated heel slide      Blank cell = exercise not performed today                                    8/29 EXERCISE LOG  Exercise Repetitions and Resistance Comments  Nustep L1 x 3 minutes Limited by pain  LAQ 15 reps each   Heel / toe raises  20 reps each   Seated hip ADD isometric  5 second hold x 15 reps   Seated clams No resistance x 15 reps    Seated hamstring isometric  5 second hold x 15 reps each   Toe tap on 6" step 15 reps each   Standing hip ABD  15 reps each   Standing hip flexion and extension 10 reps each    Blank cell = exercise not performed today    PATIENT EDUCATION:  Education details: benefits of pelvic health  PT, prognosis  Person educated: Patient and Spouse Education method: Explanation Education comprehension: verbalized understanding   HOME EXERCISE PROGRAM: Pt instructed in exs today and did well. Handout given for HEP as well as red tband.    ASSESSMENT:  CLINICAL IMPRESSION: Pt arrived today doing fair , but with sciatic pain. Rx focused on LE strengthening. HEP exs reviewed and progressed and handout given as well  as red tband. LTG #1 MET, but others not due to sciatic pain and increased her time for sit to stand and Tug test.   OBJECTIVE IMPAIRMENTS Abnormal gait, decreased activity tolerance, decreased balance, decreased mobility, difficulty walking, decreased ROM, decreased strength, postural dysfunction, and pain.   ACTIVITY LIMITATIONS carrying, lifting, bending, standing, squatting, sleeping, stairs, transfers, and locomotion level  PARTICIPATION LIMITATIONS: meal prep, cleaning, laundry, shopping, and community activity  PERSONAL FACTORS Past/current experiences, Time since onset of injury/illness/exacerbation, Transportation, and 3+ comorbidities: Fibromyalgia, DM, chronic pain, depression, history of multiple UTI's, incontinence, history of MI, PAD, smoker  are also affecting patient's functional outcome.   REHAB POTENTIAL: Poor    CLINICAL DECISION MAKING: Unstable/unpredictable  EVALUATION COMPLEXITY: High   GOALS: Goals reviewed with patient? No  LONG TERM GOALS: Target date: 06/07/2022   Patient will be independent with her HEP.  Baseline:  Goal status: MET  2.  Patient will improve her TUG time to 15 seconds or less.  Baseline:  Goal status: Not Met 25 seconds due to sciatic pain  3.  Patient will be able to improve her five time sit to stand to 20 seconds or less with upper extremity support for improved lower extremity strength and power.  Baseline:  Goal status: Not Met  26 mins due to sciatic pain   4.  Patient will report being able to walk for at  least 10 minutes.  Baseline:  Goal status: Not Met 5 mins at one time   PLAN: PT FREQUENCY: 2x/week  PT DURATION: 3 weeks  PLANNED INTERVENTIONS: Therapeutic exercises, Therapeutic activity, Neuromuscular re-education, Balance training, Gait training, Patient/Family education, Self Care, Stair training, and Re-evaluation  PLAN FOR NEXT SESSION:  DC to HEP   Ashland Wiseman,CHRIS, PTA 06/10/2022, 5:02 PM   PHYSICAL THERAPY DISCHARGE SUMMARY  Visits from Start of Care: 6  Current functional level related to goals / functional outcomes: Patient was able to partially meet her goals for therapy. However, she was limited by her sciatic pain today.    Remaining deficits: Lower extremity strength and power    Education / Equipment: HEP    Patient agrees to discharge. Patient goals were partially met. Patient is being discharged due to being pleased with the current functional level.  Jacqulynn Cadet, PT, DPT

## 2022-06-11 ENCOUNTER — Telehealth: Payer: Self-pay | Admitting: Pharmacist

## 2022-06-11 NOTE — Chronic Care Management (AMB) (Signed)
    Chronic Care Management Pharmacy Assistant   Name: Daleena Rotter  MRN: [id number] DOB: 07/04/1956  06/14/2022 APPOINTMENT REMINDER  Called Ja[facility] Adrian-Munro, No answer, left message of appointment on 06/14/2022 at 3:00 via telephone visit with Gaylord Shih Pharm D. Notified to have all medications, supplements, blood pressure and/or blood sugar logs available during appointment and to return call if need to reschedule.  Care Gaps: AWV - completed 02/23/2022  Last BP - 140/60 on 05/06/2022 Last A1C - 8.1 on 05/06/2022 Covid booster - never done Papsmear -  never done Shingrix - never done Eye exam - overdue Flu - postponed Mammogram - postponed Colonoscopy - postponed Tdap - postponed  Star Rating Drug: Ozempic 2mg /75ml - last filled 05/11/2022 42 DS at [facility] Pravastatin 40 mg - last filled 05/19/2022 90 DS at [facility]  Any gaps in medications fill history? No  [facility]  [facility] [phone]

## 2022-06-14 ENCOUNTER — Ambulatory Visit (INDEPENDENT_AMBULATORY_CARE_PROVIDER_SITE_OTHER): Payer: Medicare Other | Admitting: Pharmacist

## 2022-06-14 DIAGNOSIS — I10 Essential (primary) hypertension: Secondary | ICD-10-CM

## 2022-06-14 DIAGNOSIS — E118 Type 2 diabetes mellitus with unspecified complications: Secondary | ICD-10-CM

## 2022-06-14 NOTE — Progress Notes (Signed)
Chronic Care Management Pharmacy Note  06/14/2022 Name:  [name] MRN:  [id number] DOB:  1956/03/27  Summary: A1c is not at goal < 7%  Recommendations/Changes made from today's visit: -Recommended restarting aspirin 81 mg daily -Recommended connecting to Dexcom clarity and sent request -Recommend continuing on Ozempic 0.25 mg for at least 4 weeks and then consider further titration depending on tolerability  Plan: PCP follow up in 1-2 months Follow up in 4 months   Subjective: [name] is an 66 y.o. year old female who is a primary patient of [name], NP.  The CCM team was consulted for assistance with disease management and care coordination needs.    Engaged with patient by telephone for follow up visit in response to provider referral for pharmacy case management and/or care coordination services.   Consent to Services:  The patient was given information about Chronic Care Management services, agreed to services, and gave verbal consent prior to initiation of services.  Please see initial visit note for detailed documentation.   Patient Care Team: [name], NP as PCP - General (Family Medicine) Minus Breeding, MD as PCP - Cardiology (Cardiology) Nobie Putnam, MD (Hematology and Oncology) [place], Peggyann Juba as Referring Physician (Physician Assistant) Kathrynn Ducking, MD (Inactive) as Consulting Physician (Neurology) Viona Gilmore, [facility] as Pharmacist (Pharmacist)  Recent office visits: 05/06/2022 [name] NP: Patient presented for hospital follow up.  Referred to PT and gastroenterology. Prescribed metoclopramide PRN and plan to hold on metformin for now.  02/24/2022 [name] NP - Patient was seen for Routine general medical examination at a health care facility and additional concerns. Discontinued Amitriptyline and Metformin. Increase Ozempic to 1 mg weekly. Follow up in 3 weeks.    02/23/22 Caroleen Hamman LPN - Medicare annual  wellness exam.  Recent consult visits: 06/10/22 Andy Gauss, PTA (outpatient rehab): Patient presented for PT treatment for unsteadiness on feet and discharge from PT.  12/24/2020 Minus Breeding MD (Cardiology) - Patient was seen for Coronary artery disease and 3 other issues.  Discontinued Fluticasone. Referred to Pulmonology. Follow up in 12 months.  Hospital visits: 04/22/22 Patient presented to [facility] ED for abdominal pain. Prescribed Macrobid x 10 days for UTI, Zofran PRN and promethazine PRN.   Objective:  Lab Results  Component Value Date   CREATININE 1.33 (H) 06/03/2022   BUN 15 06/03/2022   GFR 42.35 (L) 06/03/2022   GFRNONAA 54 (L) 08/21/2018   GFRAA >60 08/21/2018   NA 139 06/03/2022   K 4.0 06/03/2022   CALCIUM 9.3 06/03/2022   CO2 24 06/03/2022   GLUCOSE 168 (H) 06/03/2022    Lab Results  Component Value Date/Time   HGBA1C 8.1 (H) 05/06/2022 02:38 PM   HGBA1C 7.3 (H) 02/24/2022 03:08 PM   GFR 42.35 (L) 06/03/2022 03:48 PM   GFR 37.28 (L) 05/06/2022 02:38 PM   MICROALBUR 1.5 02/24/2022 03:08 PM   MICROALBUR 1.4 04/05/2013 02:05 PM    Last diabetic Eye exam:  Lab Results  Component Value Date/Time   HMDIABEYEEXA no DR 05/11/2013 12:00 AM    Last diabetic Foot exam: No results found for: "HMDIABFOOTEX"   Lab Results  Component Value Date   CHOL 150 02/24/2022   HDL 46.40 02/24/2022   LDLCALC 69 10/30/2020   LDLDIRECT 72.0 02/24/2022   TRIG 269.0 (H) 02/24/2022   CHOLHDL 3 02/24/2022       Latest Ref Rng & Units 06/03/2022    3:48 PM 05/06/2022  2:38 PM 02/24/2022    3:08 PM  Hepatic Function  Total Protein 6.0 - 8.3 g/dL 6.3  6.6  7.0   Albumin 3.5 - 5.2 g/dL 3.5  3.8  4.1   AST 0 - 37 U/L 13  21  14    ALT 0 - 35 U/L 7  11  10    Alk Phosphatase 39 - 117 U/L 57  55  56   Total Bilirubin 0.2 - 1.2 mg/dL 0.3  0.3  0.3     Lab Results  Component Value Date/Time   TSH 1.55 02/24/2022 03:08 PM   TSH 2.33 10/30/2020 01:38 PM        Latest Ref Rng & Units 06/03/2022    3:48 PM 05/06/2022    2:38 PM 02/24/2022    3:08 PM  CBC  WBC 4.0 - 10.5 K/uL 13.0  17.2  13.8   Hemoglobin 12.0 - 15.0 g/dL 13.1  14.5  13.3   Hematocrit 36.0 - 46.0 % 40.1  44.9  41.0   Platelets 150.0 - 400.0 K/uL 280.0  275.0  334.0     No results found for: "VD25OH"  Clinical ASCVD: Yes  The ASCVD Risk score (Arnett DK, et al., 2019) failed to calculate for the following reasons:   The patient has a prior MI or stroke diagnosis       02/23/2022    3:05 PM 11/06/2021    3:25 PM 02/19/2021    2:45 PM  Depression screen PHQ 2/9  Decreased Interest 0 3 0  Down, Depressed, Hopeless 1 3 0  PHQ - 2 Score 1 6 0  Altered sleeping  3   Tired, decreased energy  3   Change in appetite  3   Feeling bad or failure about yourself   3   Trouble concentrating  3   Moving slowly or fidgety/restless  2   Suicidal thoughts  0   PHQ-9 Score  23   Difficult doing work/chores  Somewhat difficult       Social History   Tobacco Use  Smoking Status Every Day   Packs/day: 1.00   Years: 43.00   Total pack years: 43.00   Types: Cigarettes  Smokeless Tobacco Never   BP Readings from Last 3 Encounters:  05/06/22 (!) 140/60  02/24/22 110/60  11/06/21 108/64   Pulse Readings from Last 3 Encounters:  05/06/22 84  02/24/22 95  11/06/21 66   Wt Readings from Last 3 Encounters:  05/06/22 129 lb (58.5 kg)  02/24/22 142 lb (64.4 kg)  02/23/22 140 lb (63.5 kg)   BMI Readings from Last 3 Encounters:  05/06/22 22.85 kg/m  02/24/22 25.15 kg/m  02/23/22 24.80 kg/m    Assessment/Interventions: Review of patient past medical history, allergies, medications, health status, including review of consultants reports, laboratory and other test data, was performed as part of comprehensive evaluation and provision of chronic care management services.   SDOH:  (Social Determinants of Health) assessments and interventions performed: Yes  SDOH Interventions     Flowsheet Row Chronic Care Management from 06/14/2022 in [place] at [place] Management from 07/21/2021 in [place] at [place] from 02/19/2021 in [place] at [place] Interventions -- -- Intervention Not Indicated  Housing Interventions -- -- Intervention Not Indicated  Transportation Interventions -- Intervention Not Indicated Intervention Not Indicated  Financial Strain Interventions Other (Comment)  [approved for patient assistance for Ozempic] Other (Comment)  [  working on PAP] Intervention Not Indicated  Physical Activity Interventions -- -- Intervention Not Indicated  Social Connections Interventions -- -- Intervention Not Indicated       SDOH Screenings   Food Insecurity: No Food Insecurity (02/23/2022)  Housing: Low Risk  (02/19/2021)  Transportation Needs: No Transportation Needs (02/23/2022)  Alcohol Screen: Low Risk  (02/23/2022)  Depression (PHQ2-9): Low Risk  (02/23/2022)  Financial Resource Strain: Medium Risk (06/14/2022)  Physical Activity: Inactive (02/23/2022)  Social Connections: Socially Isolated (02/23/2022)  Stress: No Stress Concern Present (02/23/2022)  Tobacco Use: High Risk (06/10/2022)    CCM Care Plan  Allergies  Allergen Reactions   Erythromycin Other (See Comments)   Statins     SEVERE MUSCLE PAIN - failed lipitor, Livalo, red yeast rice (lovastatin), and she thinks Zocor and pravastatin   Ace Inhibitors Cough    Patient refuses to take meds   Cymbalta [Duloxetine Hcl]     Drowsiness    Medications Reviewed Today     Reviewed by Viona Gilmore, [facility] (Pharmacist) on 06/14/22 at [place] List Status: <None>   Medication Order Taking? Sig Documenting Provider Last Dose Status Informant  amLODipine (NORVASC) 5 MG tablet [id number] Yes Take 1 tablet (5 mg total) by mouth daily. [name], Tommi Rumps, NP Taking Active   B-D ULTRAFINE III SHORT PEN 31G X 8  MM MISC [id number]  USE AS DIRECTED [name], Tommi Rumps, NP  Active   busPIRone (BUSPAR) 15 MG tablet [id number]  TAKE 1 TABLET BY MOUTH THREE TIMES DAILY [name], Tommi Rumps, NP  Active   Cholecalciferol (VITAMIN D3) 2000 units TABS [id number]  Take 1 tablet by mouth daily. [provider]  Active   cilostazol (PLETAL) 100 MG tablet [id number]  Take 1/2 (one-half) tablet by mouth twice daily [name], Tommi Rumps, NP  Active   Continuous Blood Gluc Receiver ([place]) Kerrin Mo [id number]  Use with Dexcom G7 sensory [name], Tommi Rumps, NP  Active   Continuous Blood Gluc Sensor ([place]) [place] [id number]  Use one sensor every 10 days [name], Tommi Rumps, NP  Active   cyclobenzaprine (FLEXERIL) 10 MG tablet [id number]  Take 1 tablet (10 mg total) by mouth at bedtime. [name], Tommi Rumps, NP  Active   FLUoxetine (PROZAC) 40 MG capsule [id number]  Take 1 capsule (40 mg total) by mouth daily. [name], Tommi Rumps, NP  Active   hydrochlorothiazide (MICROZIDE) 12.5 MG capsule [id number]  Take 1 capsule by mouth once daily [name], Tommi Rumps, NP  Active   insulin glargine (LANTUS SOLOSTAR) 100 UNIT/ML Solostar Pen [id number]  Inject 16 Units into the skin 2 (two) times daily. [name], Tommi Rumps, NP  Active   isosorbide mononitrate (IMDUR) 60 MG 24 hr tablet [id number]  Take 1.5 tablets (90 mg total) by mouth daily. [name], Tommi Rumps, NP  Active   levETIRAcetam (KEPPRA) 250 MG tablet [id number]  Take 1 tablet (250 mg total) by mouth 3 (three) times daily. [name], Tommi Rumps, NP  Active   metoCLOPramide (REGLAN) 5 MG tablet [id number]  Take 1 tablet (5 mg total) by mouth every 8 (eight) hours as needed for nausea. [name], NP  Active     Discontinued 06/14/22 1519 (Completed Course)   nitroGLYCERIN (NITROSTAT) 0.4 MG SL tablet [id number]  Place 1 tablet (0.4 mg total) under the tongue every 5 (five) minutes x 3 doses as needed for chest pain. [name], Tommi Rumps, NP  Active   nystatin cream (MYCOSTATIN) [id number]  Apply 1 application topically 2 (two)  times daily. [name], NP  Active  OZEMPIC, 0.25 OR 0.5 MG/DOSE, 2 MG/1.5ML SOPN [id number]  INJECT 0.25MG SUBCUTANEOUSLY ONCE A WEEK [name], Tommi Rumps, NP  Active   pravastatin (PRAVACHOL) 40 MG tablet [id number]  Take 1 tablet (40 mg total) by mouth at bedtime. [name], Tommi Rumps, NP  Active   pregabalin (LYRICA) 100 MG capsule [id number]  Take 1 capsule by mouth twice daily [name], Tommi Rumps, NP  Active   propranolol (INDERAL) 40 MG tablet [id number]  Take 1 tablet (40 mg total) by mouth 2 (two) times daily. [name], NP  Expired 04/04/22 2359   RELION PEN NEEDLES 31G X 6 MM MISC [id number]  1 each by Other route as directed. [name], NP  Active             Patient Active Problem List   Diagnosis Date Noted   Coronary artery disease involving native coronary artery of native heart without angina pectoris 12/23/2020   Type 2 diabetes mellitus with complication, without long-term current use of insulin ([place]) 12/23/2020   Hyperlipidemia 01/31/2014   Pain in limb 12/26/2013   Disturbance of skin sensation 12/26/2013   Peripheral arterial disease ([place]) 01/23/2013   Leukocytosis    Monocytosis    Acute MI, inferior wall, initial episode of care ([place]) 07/08/2012   Tobacco use disorder 07/08/2012   Fibromyalgia 07/08/2012   Depression 07/08/2012   Diabetes mellitus type II, uncontrolled ([place]) 07/08/2012   Patient reports she started the Ozempic this morning with the 0.25 mg dose. Patient is doing Lantus 10 units BID on top of that. She is feeling better this last month than in years. She has been off the Ozempic for about a month and was wear about going back on it because of how she was feeling. She didn't notice many side effects the last time up until she was on the 1 mg dose and will not go back to that. Patient lost 10 lbs in 1 week when it was at it's worst.   Patient reports she feels normal and is getting up and moving and walking and these were not things she could do months  ago. She thinks some new energy has come with blood pressure as well. She is no longer craving any sweets and she hasn't touched either despite them being in the house. She has also cleared her UTI and is feeling better from that.  Patient finished PT and muscles are building up and is using a rollator at home still but did complete it until discharge.  Patient has not been checking BP at home but has a cuff for her husband who has trouble with BP. She can but hasn't been recently as she has been feeling so good.   There is no immunization history on file for this patient.  Conditions to be addressed/monitored:  Hypertension, Hyperlipidemia, Diabetes, Coronary Artery Disease, COPD, Depression, Tobacco use, and Fibromyalgia and Seizures  Conditions addressed this visit: Hypertension, diabetes  Care Plan : CCM Pharmacy Care Plan  Updates made by Viona Gilmore, [place] since 06/14/2022 12:00 AM     Problem: Problem: Hypertension, Hyperlipidemia, Diabetes, Coronary Artery Disease, COPD, Depression, Tobacco use, and Fibromyalgia and Seizures      Long-Range Goal: Patient-Specific Goal   Start Date: 07/21/2021  Expected End Date: 07/21/2022  Recent Progress: On track  Priority: High  Note:   Current Barriers:  Unable to independently afford treatment regimen Unable to independently monitor therapeutic efficacy Unable to achieve control of diabetes   Pharmacist Clinical Goal(s):  Patient will  verbalize ability to afford treatment regimen achieve adherence to monitoring guidelines and medication adherence to achieve therapeutic efficacy achieve control of diabetes as evidenced by A1c  through collaboration with PharmD and provider.   Interventions: 1:1 collaboration with [name], NP regarding development and update of comprehensive plan of care as evidenced by provider attestation and co-signature Inter-disciplinary care team collaboration (see longitudinal plan of  care) Comprehensive medication review performed; medication list updated in electronic medical record  Hypertension (BP goal <130/80) -Not ideally controlled -Current treatment: Hydrochlorothiazide 12.5 mg 1 capsule daily - Appropriate, Effective, Safe, Accessible Propranolol 40 mg 1 tablet twice daily - Appropriate, Effective, Safe, Accessible Amlodipine 5 mg 1 tablet daily - Appropriate, Effective, Safe, Accessible -Medications previously tried: none  -Current home readings: not checking much lately -Current dietary habits: doesn't add salt and only puts it on eggs; not eating out much -Current exercise habits: none -Denies hypotensive/hypertensive symptoms -Educated on BP goals and benefits of medications for prevention of heart attack, stroke and kidney damage; Importance of home blood pressure monitoring; Proper BP monitoring technique; -Counseled to monitor BP at home weekly, document, and provide log at future appointments -Counseled on diet and exercise extensively Recommended to continue current medication  Hyperlipidemia: (LDL goal < 55) -Uncontrolled -Current treatment: Pravastatin 40 mg 1 tablet at bedtime - Appropriate, Query effective, Safe, Accessible -Medications previously tried: none  -Current dietary patterns: doesn't eat out much -Current exercise habits: none -Educated on Cholesterol goals;  Benefits of statin for ASCVD risk reduction; Importance of limiting foods high in cholesterol; -Counseled on diet and exercise extensively Recommended to continue current medication  PAD (Goal: prevent heart events) -Not ideally controlled -Current treatment  Cilostazol 100 mg 1/2 tablet twice daily - Appropriate, Effective, Safe, Accessible Pravastatin 40 mg 1 tablet at bedtime - Appropriate, Effective, Safe, Accessible -Medications previously tried: aspirin  -Recommended to restart taking aspirin.  CAD (Goal: prevent heart events) -Not ideally controlled -Current  treatment  Pravastatin 40 mg 1 tablet at bedtime - Appropriate, Effective, Safe, Accessible Isosorbide mononitrate 60 mg 1.5 tablets daily Nitroglycerin 0.4 mg 1 tablet as needed -Medications previously tried: aspirin (unknown) -Recommended to restart aspirin.  Diabetes (A1c goal <7%) -Uncontrolled -Current medications: Lantus 10 units twice daily - 9 in the morning and 9 at night - Appropriate, Query effective, Query Safe, Accessible Ozempic 0.25 mg inject once weekly - Appropriate, Effective, Safe, Accessible -Medications previously tried: glipizide, metformin -Current home glucose readings fasting glucose: using Dexcom (pt says her average is 120 and current GMI is 6.9%) post prandial glucose: n/a -Reports hypoglycemic/hyperglycemic symptoms -Current meal patterns:  breakfast: not eating much  lunch: sandwich or feeling sick  dinner: vegetable and meat snacks: n/a drinks: n/a -Current exercise: none -Educated on A1c and blood sugar goals; Prevention and management of hypoglycemic episodes; Benefits of routine self-monitoring of blood sugar; Carbohydrate counting and/or plate method -Counseled to check feet daily and get yearly eye exams -Counseled on diet and exercise extensively Recommended to continue current medication  Depression/Anxiety (Goal: minimize symptoms) -Controlled -Current treatment: Buspirone 15 mg 1 tablet three times daily - Appropriate, Effective, Safe, Accessible Fluoxetine 40 mg 1 capsule daily - Appropriate, Effective, Safe, Accessible -Medications previously tried/failed: amitriptyline -PHQ9: 0 -GAD7: n/a -Educated on Benefits of medication for symptom control Benefits of cognitive-behavioral therapy with or without medication -Recommended to continue current medication  Tobacco use (Goal quit smoking) -Uncontrolled -Previous quit attempts: Zyban (sick), Chantix (heart palpitations), therapy, hypnosis -Current treatment  No  medications -Patient  smokes Within 30 minutes of waking -Patient triggers include: stress and habit -On a scale of 1-10, reports MOTIVATION to quit is 4 -On a scale of 1-10, reports CONFIDENCE in quitting is 3 -Provided contact information for [place] Quit Line (1-800-QUIT-NOW) and encouraged patient to reach out to this group for support. -Counseled on benefits of smoking cessation.  Fibromyalgia (Goal: minimize pain) -Not ideally controlled -Current treatment  Pregabalin 100 mg 1 capsule twice daily - Appropriate, Query effective, Safe, Accessible -Medications previously tried: amitriptyline -Recommended to continue current medication  Seizures (Goal: prevent seizures) -Not ideally controlled -Current treatment  Levetiracetam 250 mg 1 tablet three times daily - taking 2 in the morning, 1 in daytime and 1 in the evening - Appropriate, Effective, Safe, Accessible -Medications previously tried: none  -Recommended to continue current medication   Health Maintenance -Vaccine gaps: COVID, prevnar, tetanus, shingrix -Current therapy:  Cyclobenzaprine 10 mg at bedtime  Vitamin D 2000 units daily Nystatin cream as needed Melatonin 10 mg - at bedtime (years ago) -Educated on Cost vs benefit of each product must be carefully weighed by individual consumer -Patient is satisfied with current therapy and denies issues -Recommended decreasing melatonin to 5 mg per night.  Patient Goals/Self-Care Activities Patient will:  - take medications as prescribed as evidenced by patient report and record review check glucose with CGM, document, and provide at future appointments engage in dietary modifications by increasing protein intake with each meal  Follow Up Plan: The care management team will reach out to the patient again over the next 30 days.         Medication Assistance: None required.  Patient affirms current coverage meets needs.  Compliance/Adherence/Medication fill history: Care  Gaps: COVID vaccine, tetanus, Hep C screening, PAP smear, colonoscopy, mammogram, shingrix, eye exam, influenza Last BP - 140/60 on 05/06/2022 Last A1C - 8.1 on 05/06/2022  Star-Rating Drugs: Ozempic 32m/3ml - approved for PAP Pravastatin 40 mg - [facility]  [facility] [address]Phone: [phone]: [phone]  Uses pill box? No - drawer - pulls them out every day Pt endorses 90% compliance  - once or twice a month  We discussed: Current pharmacy is preferred with insurance plan and patient is satisfied with pharmacy services Patient decided to: Continue current medication management strategy  Care Plan and Follow Up Patient Decision:  Patient agrees to Care Plan and Follow-up.  Plan: The care management team will reach out to the patient again over the next 30 days.  MJeni Salles PharmD, BWalkertownPharmacist LCape Canaveralat [place]

## 2022-06-14 NOTE — Patient Instructions (Addendum)
Hi [name],  It was great to catch up with you again! Like we discussed, try to connect to [facility] clarity so I can have a better look at your readings. Also, please try to check your blood pressure once or twice a week to keep an eye on it and make sure your medications are working.  Also don't forget to start back on the aspirin 81 mg daily for heart event prevention.  Please reach out to me if you have any questions or need anything before I reach back out!  Best, Maddie  Gaylord Shih, PharmD, [facility] Clinical Pharmacist [facility] [phone]   Visit Information   Goals Addressed   None    Patient Care Plan: CCM Pharmacy Care Plan     Problem Identified: Problem: Hypertension, Hyperlipidemia, Diabetes, Coronary Artery Disease, COPD, Depression, Tobacco use, and Fibromyalgia and Seizures      Long-Range Goal: Patient-Specific Goal   Start Date: 07/21/2021  Expected End Date: 07/21/2022  Recent Progress: On track  Priority: High  Note:   Current Barriers:  Unable to independently afford treatment regimen Unable to independently monitor therapeutic efficacy Unable to achieve control of diabetes   Pharmacist Clinical Goal(s):  Patient will verbalize ability to afford treatment regimen achieve adherence to monitoring guidelines and medication adherence to achieve therapeutic efficacy achieve control of diabetes as evidenced by A1c  through collaboration with PharmD and provider.   Interventions: 1:1 collaboration with Shirline Frees, NP regarding development and update of comprehensive plan of care as evidenced by provider attestation and co-signature Inter-disciplinary care team collaboration (see longitudinal plan of care) Comprehensive medication review performed; medication list updated in electronic medical record  Hypertension (BP goal <130/80) -Not ideally controlled -Current treatment: Hydrochlorothiazide 12.5 mg 1 capsule daily -  Appropriate, Effective, Safe, Accessible Propranolol 40 mg 1 tablet twice daily - Appropriate, Effective, Safe, Accessible Amlodipine 5 mg 1 tablet daily - Appropriate, Effective, Safe, Accessible -Medications previously tried: none  -Current home readings: not checking much lately -Current dietary habits: doesn't add salt and only puts it on eggs; not eating out much -Current exercise habits: none -Denies hypotensive/hypertensive symptoms -Educated on BP goals and benefits of medications for prevention of heart attack, stroke and kidney damage; Importance of home blood pressure monitoring; Proper BP monitoring technique; -Counseled to monitor BP at home weekly, document, and provide log at future appointments -Counseled on diet and exercise extensively Recommended to continue current medication  Hyperlipidemia: (LDL goal < 55) -Uncontrolled -Current treatment: Pravastatin 40 mg 1 tablet at bedtime - Appropriate, Query effective, Safe, Accessible -Medications previously tried: none  -Current dietary patterns: doesn't eat out much -Current exercise habits: none -Educated on Cholesterol goals;  Benefits of statin for ASCVD risk reduction; Importance of limiting foods high in cholesterol; -Counseled on diet and exercise extensively Recommended to continue current medication  PAD (Goal: prevent heart events) -Not ideally controlled -Current treatment  Cilostazol 100 mg 1/2 tablet twice daily - Appropriate, Effective, Safe, Accessible Pravastatin 40 mg 1 tablet at bedtime - Appropriate, Effective, Safe, Accessible -Medications previously tried: aspirin  -Recommended to restart taking aspirin.  CAD (Goal: prevent heart events) -Not ideally controlled -Current treatment  Pravastatin 40 mg 1 tablet at bedtime - Appropriate, Effective, Safe, Accessible Isosorbide mononitrate 60 mg 1.5 tablets daily Nitroglycerin 0.4 mg 1 tablet as needed -Medications previously tried: aspirin  (unknown) -Recommended to restart aspirin.  Diabetes (A1c goal <7%) -Uncontrolled -Current medications: Lantus 10 units twice daily - 9 in the morning  and 9 at night - Appropriate, Query effective, Query Safe, Accessible Ozempic 0.25 mg inject once weekly - Appropriate, Effective, Safe, Accessible -Medications previously tried: glipizide, metformin -Current home glucose readings fasting glucose: using Dexcom (pt says her average is 120 and current GMI is 6.9%) post prandial glucose: n/a -Reports hypoglycemic/hyperglycemic symptoms -Current meal patterns:  breakfast: not eating much  lunch: sandwich or feeling sick  dinner: vegetable and meat snacks: n/a drinks: n/a -Current exercise: none -Educated on A1c and blood sugar goals; Prevention and management of hypoglycemic episodes; Benefits of routine self-monitoring of blood sugar; Carbohydrate counting and/or plate method -Counseled to check feet daily and get yearly eye exams -Counseled on diet and exercise extensively Recommended to continue current medication  Depression/Anxiety (Goal: minimize symptoms) -Controlled -Current treatment: Buspirone 15 mg 1 tablet three times daily - Appropriate, Effective, Safe, Accessible Fluoxetine 40 mg 1 capsule daily - Appropriate, Effective, Safe, Accessible -Medications previously tried/failed: amitriptyline -PHQ9: 0 -GAD7: n/a -Educated on Benefits of medication for symptom control Benefits of cognitive-behavioral therapy with or without medication -Recommended to continue current medication  Tobacco use (Goal quit smoking) -Uncontrolled -Previous quit attempts: Zyban (sick), Chantix (heart palpitations), therapy, hypnosis -Current treatment  No medications -Patient smokes Within 30 minutes of waking -Patient triggers include: stress and habit -On a scale of 1-10, reports MOTIVATION to quit is 4 -On a scale of 1-10, reports CONFIDENCE in quitting is 3 -Provided contact  information for [place] Quit Line (1-800-QUIT-NOW) and encouraged patient to reach out to this group for support. -Counseled on benefits of smoking cessation.  Fibromyalgia (Goal: minimize pain) -Not ideally controlled -Current treatment  Pregabalin 100 mg 1 capsule twice daily - Appropriate, Query effective, Safe, Accessible -Medications previously tried: amitriptyline -Recommended to continue current medication  Seizures (Goal: prevent seizures) -Not ideally controlled -Current treatment  Levetiracetam 250 mg 1 tablet three times daily - taking 2 in the morning, 1 in daytime and 1 in the evening - Appropriate, Effective, Safe, Accessible -Medications previously tried: none  -Recommended to continue current medication   Health Maintenance -Vaccine gaps: COVID, prevnar, tetanus, shingrix -Current therapy:  Cyclobenzaprine 10 mg at bedtime  Vitamin D 2000 units daily Nystatin cream as needed Melatonin 10 mg - at bedtime (years ago) -Educated on Cost vs benefit of each product must be carefully weighed by individual consumer -Patient is satisfied with current therapy and denies issues -Recommended decreasing melatonin to 5 mg per night.  Patient Goals/Self-Care Activities Patient will:  - take medications as prescribed as evidenced by patient report and record review check glucose with CGM, document, and provide at future appointments engage in dietary modifications by increasing protein intake with each meal  Follow Up Plan: The care management team will reach out to the patient again over the next 30 days.        Patient verbalizes understanding of instructions and care plan provided today and agrees to view in [place]. Active MyChart status and patient understanding of how to access instructions and care plan via MyChart confirmed with patient.    The pharmacy team will reach out to the patient again over the next 7 days.   Viona Gilmore, [facility]

## 2022-06-23 ENCOUNTER — Telehealth: Payer: Self-pay | Admitting: Pharmacist

## 2022-06-23 ENCOUNTER — Other Ambulatory Visit: Payer: Self-pay | Admitting: Adult Health

## 2022-06-23 NOTE — Telephone Encounter (Signed)
Called patient about scheduling a follow up with PCP in 2 months for repeat A1c. Patient reports she is already starting to have side effects with the Ozempic 0.25 mg. She had some vomiting today so she isn't sure she wants to stay on it. Discussed other options such as Rybelsus, Trulicity and Victoza. Based on cost and patient preference she would like to consider Victoza. Will route to PCP to discuss and can easily apply for PAP given patient was already approved through [place] for [place].

## 2022-06-26 DIAGNOSIS — I119 Hypertensive heart disease without heart failure: Secondary | ICD-10-CM | POA: Diagnosis not present

## 2022-06-26 DIAGNOSIS — I2510 Atherosclerotic heart disease of native coronary artery without angina pectoris: Secondary | ICD-10-CM | POA: Diagnosis not present

## 2022-06-26 DIAGNOSIS — F17210 Nicotine dependence, cigarettes, uncomplicated: Secondary | ICD-10-CM

## 2022-06-26 DIAGNOSIS — I739 Peripheral vascular disease, unspecified: Secondary | ICD-10-CM | POA: Diagnosis not present

## 2022-06-26 DIAGNOSIS — E785 Hyperlipidemia, unspecified: Secondary | ICD-10-CM

## 2022-06-26 DIAGNOSIS — E1159 Type 2 diabetes mellitus with other circulatory complications: Secondary | ICD-10-CM | POA: Diagnosis not present

## 2022-06-26 DIAGNOSIS — Z794 Long term (current) use of insulin: Secondary | ICD-10-CM | POA: Diagnosis not present

## 2022-06-26 DIAGNOSIS — Z7985 Long-term (current) use of injectable non-insulin antidiabetic drugs: Secondary | ICD-10-CM | POA: Diagnosis not present

## 2022-06-29 NOTE — Progress Notes (Deleted)
Cardiology Office Note   Date:  06/29/2022   ID:  [name], DOB 07-01-1956, MRN [id number]  PCP:  Sydney Peng, NP  Cardiologist:   Sydney Breeding, MD Referring:  Sydney Peng, NP  No chief complaint on file.     History of Present Illness: [name] is a 66 y.o. female who presents for a history of CAD.  He had a stent to the PDA in 2013.  ***   ***   I saw him in 2013.  She was most seen by Dr. Acie Riddle in 2021.  This was before dental extraction.  He is moving here because of proximity.  She is due to have multiple teeth removed with dental surgery.  She complains of predominantly shortness of breath.  This was symptoms he had she had her angioplasty.  She says she is short of breath doing normal activities.  She is not describing PND or orthopnea.  She does occasionally get some chest discomfort.  She can really quantify or qualify this.  She had to use about 6-8 nitroglycerin over the past month.  This might be a somewhat stable pattern.  She is not getting this discomfort with her level of exertion that she can she is.  She had COVID few months ago.  She thought this was mild although she had a cough and has had a residual dry cough since then.  He still smokes cigarettes.  Of note she said she was seen at the [place] at one point and was told she had some abnormal pulmonary function test but I do not have these results.  She was supposed to follow-up with a pulmonologist but they never arrange this.  She is no longer at the [place].   Past Medical History:  Diagnosis Date   Acute MI, inferior wall, initial episode of care ([place]) 07/08/2012   Cath-07/08/11 -distal RCA stent DES (99%), LAD 20-30%. EF 50% inferior hypokinesis  (infarct)   Anxiety    Asthma    Coronary artery disease    Depression    Diabetes mellitus    Fibromyalgia    Leukocytosis    Monocytosis    Peripheral arterial disease ([place]) 01/23/2013   Tobacco use disorder 07/08/2012    Past Surgical  History:  Procedure Laterality Date   ABDOMINAL AORTAGRAM N/A 02/07/2013   Procedure: ABDOMINAL Maxcine [place];  Surgeon: Sydney Hampshire, MD;  Location: [facility] CATH LAB;  Service: Cardiovascular;  Laterality: N/A;   CARDIAC CATHETERIZATION     CORONARY STENT PLACEMENT     CYST REMOVAL TRUNK  1977   tailbone   LEFT HEART CATHETERIZATION WITH CORONARY ANGIOGRAM N/A 07/07/2012   Procedure: LEFT HEART CATHETERIZATION WITH CORONARY ANGIOGRAM;  Surgeon: Sydney Blanks, MD;  Location[facility] CATH LAB;  Service: Cardiovascular;  Laterality: N/A;   PERCUTANEOUS CORONARY STENT INTERVENTION (PCI-S)  07/07/2012   Procedure: PERCUTANEOUS CORONARY STENT INTERVENTION (PCI-S);  Surgeon: Sydney Blanks, MD;  Location: [facility] CATH LAB;  Service: Cardiovascular;;   TONSILLECTOMY       Current Outpatient Medications  Medication Sig Dispense Refill   amLODipine (NORVASC) 5 MG tablet Take 1 tablet (5 mg total) by mouth daily. 90 tablet 2   B-D ULTRAFINE III SHORT PEN 31G X 8 MM MISC USE AS DIRECTED 100 each 0   busPIRone (BUSPAR) 15 MG tablet TAKE 1 TABLET BY MOUTH THREE TIMES DAILY 270 tablet 0   Cholecalciferol (VITAMIN D3) 2000 units TABS Take 1 tablet by mouth daily.  cilostazol (PLETAL) 100 MG tablet Take 1/2 (one-half) tablet by mouth twice daily 90 tablet 0   Continuous Blood Gluc Receiver (DEXCOM G7 RECEIVER) DEVI Use with Dexcom G7 sensory 1 each 0   Continuous Blood Gluc Sensor (DEXCOM G7 SENSOR) MISC Use one sensor every 10 days 3 each 12   cyclobenzaprine (FLEXERIL) 10 MG tablet Take 1 tablet (10 mg total) by mouth at bedtime. 90 tablet 0   FLUoxetine (PROZAC) 40 MG capsule Take 1 capsule (40 mg total) by mouth daily. 90 capsule 0   hydrochlorothiazide (MICROZIDE) 12.5 MG capsule Take 1 capsule by mouth once daily 90 capsule 2   insulin glargine (LANTUS SOLOSTAR) 100 UNIT/ML Solostar Pen Inject 16 Units into the skin 2 (two) times daily. 30 mL 0   isosorbide mononitrate (IMDUR) 60 MG 24 hr  tablet Take 1.5 tablets (90 mg total) by mouth daily. 135 tablet 3   levETIRAcetam (KEPPRA) 250 MG tablet Take 1 tablet (250 mg total) by mouth 3 (three) times daily. 270 tablet 1   metoCLOPramide (REGLAN) 5 MG tablet Take 1 tablet (5 mg total) by mouth every 8 (eight) hours as needed for nausea. 30 tablet 1   nitroGLYCERIN (NITROSTAT) 0.4 MG SL tablet Place 1 tablet (0.4 mg total) under the tongue every 5 (five) minutes x 3 doses as needed for chest pain. 30 tablet 12   nystatin cream (MYCOSTATIN) Apply 1 application topically 2 (two) times daily. 30 g 2   OZEMPIC, 0.25 OR 0.5 MG/DOSE, 2 MG/1.5ML SOPN INJECT 0.25MG  SUBCUTANEOUSLY ONCE A WEEK 2 mL 0   pravastatin (PRAVACHOL) 40 MG tablet Take 1 tablet (40 mg total) by mouth at bedtime. 90 tablet 1   pregabalin (LYRICA) 100 MG capsule Take 1 capsule by mouth twice daily 180 capsule 0   propranolol (INDERAL) 40 MG tablet Take 1 tablet (40 mg total) by mouth 2 (two) times daily. 180 tablet 1   RELION PEN NEEDLES 31G X 6 MM MISC 1 each by Other route as directed. 50 each 2   No current facility-administered medications for this visit.    Allergies:   Erythromycin, Statins, Ace inhibitors, and Cymbalta [duloxetine hcl]    ROS:  Please see the history of present illness.   Otherwise, review of systems are positive for ***.   All other systems are reviewed and negative.    PHYSICAL EXAM: VS:  There were no vitals taken for this visit. , BMI There is no height or weight on file to calculate BMI. GENERAL:  Well appearing NECK:  No jugular venous distention, waveform within normal limits, carotid upstroke brisk and symmetric, no bruits, no thyromegaly LUNGS:  Clear to auscultation bilaterally CHEST:  Unremarkable HEART:  PMI not displaced or sustained,S1 and S2 within normal limits, no S3, no S4, no clicks, no rubs, *** murmurs ABD:  Flat, positive bowel sounds normal in frequency in pitch, no bruits, no rebound, no guarding, no midline pulsatile  mass, no hepatomegaly, no splenomegaly EXT:  2 plus pulses throughout, no edema, no cyanosis no clubbing    ***GENERAL:  Well appearing NECK:  No jugular venous distention, waveform within normal limits, carotid upstroke brisk and symmetric, no bruits, no thyromegaly LUNGS: Decreased breath sounds without wheezing or crackles CHEST:  Unremarkable HEART:  PMI not displaced or sustained,S1 and S2 within normal limits, no S3, no S4, no clicks, no rubs, no murmurs ABD:  Flat, positive bowel sounds normal in frequency in pitch, no bruits, no rebound, no guarding, no  midline pulsatile mass, no hepatomegaly, no splenomegaly EXT:  2 plus pulses throughout, no edema, no cyanosis no clubbing   EKG:  EKG is *** ordered today. The ekg ordered today demonstrates sinus rhythm, rate ***, right bundle branch block, right axis deviation, right bundle branch block is new since previous   Recent Labs: 02/24/2022: TSH 1.55 06/03/2022: ALT 7; BUN 15; Creatinine, Ser 1.33; Hemoglobin 13.1; Platelets 280.0; Potassium 4.0; Sodium 139    Lipid Panel    Component Value Date/Time   CHOL 150 02/24/2022 1508   TRIG 269.0 (H) 02/24/2022 1508   HDL 46.40 02/24/2022 1508   CHOLHDL 3 02/24/2022 1508   VLDL 53.8 (H) 02/24/2022 1508   LDLCALC 69 10/30/2020 1338   LDLDIRECT 72.0 02/24/2022 1508      Wt Readings from Last 3 Encounters:  05/06/22 129 lb (58.5 kg)  02/24/22 142 lb (64.4 kg)  02/23/22 140 lb (63.5 kg)      Other studies Reviewed: Additional studies/ records that were reviewed today include: *** Review of the above records demonstrates:  Please see elsewhere in the note.     ASSESSMENT AND PLAN:  CAD:    ***    She is having dyspnea.  I think this is likely pulmonary and I am going to make a referral as below.  However, to exclude obstructive coronary disease she will get a [employer].  DM: Her A1c was ***  8.3.  I will defer to Sydney Peng, NP  DYSPNEA:   ***  I will check a BNP  level.  I am going to make a pulmonary referral.  We also talked about the need to stop smoking.  She is not 1/2 pack/day.  Current medicines are reviewed at length with the patient today.  The patient does not have concerns regarding medicines.  The following changes have been made:  ***  Labs/ tests ordered today include: ***  No orders of the defined types were placed in this encounter.    Disposition:   FU with me in *** year.   Signed, Sydney Breeding, MD  06/29/2022 9:24 PM    [facility]

## 2022-06-30 ENCOUNTER — Ambulatory Visit: Payer: Medicare Other | Admitting: Cardiology

## 2022-06-30 DIAGNOSIS — E118 Type 2 diabetes mellitus with unspecified complications: Secondary | ICD-10-CM

## 2022-06-30 DIAGNOSIS — I2510 Atherosclerotic heart disease of native coronary artery without angina pectoris: Secondary | ICD-10-CM

## 2022-06-30 DIAGNOSIS — R0602 Shortness of breath: Secondary | ICD-10-CM

## 2022-07-06 ENCOUNTER — Other Ambulatory Visit: Payer: Self-pay | Admitting: Adult Health

## 2022-07-06 DIAGNOSIS — M797 Fibromyalgia: Secondary | ICD-10-CM

## 2022-07-06 MED ORDER — PREGABALIN 100 MG PO CAPS: 100.0000 mg | ORAL_CAPSULE | Freq: Two times a day (BID) | ORAL | 1 refills | Status: DC

## 2022-07-14 ENCOUNTER — Telehealth: Payer: Self-pay | Admitting: Pharmacist

## 2022-07-14 NOTE — Chronic Care Management (AMB) (Addendum)
    Chronic Care Management Pharmacy Assistant   Name: Kherington Meraz  MRN: [id number] DOB: 11-Aug-1956  Reason for Encounter: Patient assistance renewal application for Victoza. Patient notified.    [place] Pharmacist Assistant [phone]

## 2022-07-16 ENCOUNTER — Other Ambulatory Visit: Payer: Self-pay | Admitting: Adult Health

## 2022-07-16 DIAGNOSIS — I10 Essential (primary) hypertension: Secondary | ICD-10-CM

## 2022-07-19 ENCOUNTER — Encounter: Payer: Self-pay | Admitting: Adult Health

## 2022-07-19 NOTE — Progress Notes (Cosign Needed Addendum)
Spoke with Almyra Free at [facility]. Almyra Free states they have no history of Victoza, her application will need to be resent.

## 2022-07-22 ENCOUNTER — Telehealth: Payer: Self-pay | Admitting: Pharmacist

## 2022-07-22 NOTE — Chronic Care Management (AMB) (Signed)
    Chronic Care Management Pharmacy Assistant   Name: [name]  MRN: [id number] DOB: 02/09/1956    Reason for Encounter: Victoza patient assistance application   Notes: Call to [employer] to check on the status of patient's initial application for assistance for Victoza was advised patient has been approved through the end of 2023. Medication expected to ship within the next 14 business days. Updated renewal application for 4098 to reflect dose of 1.2 mg daily per [name] to be mailed to patient for processing for year 2024. Voice mail left with patient with the above information and my number if she has any further questions.  Medications: Outpatient Encounter Medications as of 07/22/2022  Medication Sig   amLODipine (NORVASC) 5 MG tablet Take 1 tablet (5 mg total) by mouth daily.   B-D ULTRAFINE III SHORT PEN 31G X 8 MM MISC USE AS DIRECTED   busPIRone (BUSPAR) 15 MG tablet TAKE 1 TABLET BY MOUTH THREE TIMES DAILY   Cholecalciferol (VITAMIN D3) 2000 units TABS Take 1 tablet by mouth daily.   cilostazol (PLETAL) 100 MG tablet Take 1/2 (one-half) tablet by mouth twice daily   Continuous Blood Gluc Receiver (DEXCOM G7 RECEIVER) DEVI Use with Dexcom G7 sensory   Continuous Blood Gluc Sensor (DEXCOM G7 SENSOR) MISC Use one sensor every 10 days   cyclobenzaprine (FLEXERIL) 10 MG tablet Take 1 tablet (10 mg total) by mouth at bedtime.   FLUoxetine (PROZAC) 40 MG capsule Take 1 capsule by mouth once daily   hydrochlorothiazide (MICROZIDE) 12.5 MG capsule Take 1 capsule by mouth once daily   insulin glargine (LANTUS SOLOSTAR) 100 UNIT/ML Solostar Pen Inject 16 Units into the skin 2 (two) times daily.   isosorbide mononitrate (IMDUR) 60 MG 24 hr tablet Take 1.5 tablets (90 mg total) by mouth daily.   levETIRAcetam (KEPPRA) 250 MG tablet Take 1 tablet (250 mg total) by mouth 3 (three) times daily.   metoCLOPramide (REGLAN) 5 MG tablet Take 1 tablet (5 mg total) by mouth every 8  (eight) hours as needed for nausea.   nitroGLYCERIN (NITROSTAT) 0.4 MG SL tablet Place 1 tablet (0.4 mg total) under the tongue every 5 (five) minutes x 3 doses as needed for chest pain.   nystatin cream (MYCOSTATIN) Apply 1 application topically 2 (two) times daily.   OZEMPIC, 0.25 OR 0.5 MG/DOSE, 2 MG/1.5ML SOPN INJECT 0.25MG  SUBCUTANEOUSLY ONCE A WEEK   pravastatin (PRAVACHOL) 40 MG tablet Take 1 tablet (40 mg total) by mouth at bedtime.   pregabalin (LYRICA) 100 MG capsule Take 1 capsule (100 mg total) by mouth 2 (two) times daily.   propranolol (INDERAL) 40 MG tablet Take 1 tablet by mouth twice daily   RELION PEN NEEDLES 31G X 6 MM MISC 1 each by Other route as directed.   No facility-administered encounter medications on file as of 07/22/2022.    Care Gaps: COVID Booster - Overdue Pap Smear - Overdue Zoster Vaccine - Overdue Eye Exam - Overdue Flu Vaccine - Postponed Mammogram - Postponed Colonoscopy - Postponed TDAP - Postponed BP- 140/60 05/06/22 AWV- 02/23/22 Lab Results  Component Value Date   HGBA1C 8.1 (H) 05/06/2022    Star Rating Drugs: Ozempic 2mg /3ml - pt no longer taking per chart notes Pravastatin 40 mg - last filled 05/19/2022 90 DS at [facility]  Patient Assistance: [place] approved - 2024 in process  [place] Pharmacist Assistant [phone]

## 2022-07-27 NOTE — Telephone Encounter (Signed)
Called patient about switch to Victoza and waiting to hear back from [employer] about approval. Patient did not answer and left a voicemail. Will also send patient a mychart message.

## 2022-07-29 ENCOUNTER — Telehealth: Payer: Self-pay | Admitting: Pharmacist

## 2022-07-29 NOTE — Chronic Care Management (AMB) (Signed)
Chronic Care Management Pharmacy Assistant   Name: [name]  MRN: [id number] DOB: 08-04-1956  Reason for Encounter: Disease State / Diabetes Assessment Call   Conditions to be addressed/monitored: DMII  Recent office visits:  None  Recent consult visits:  None  Hospital visits:  None  Medications: Outpatient Encounter Medications as of 07/29/2022  Medication Sig   amLODipine (NORVASC) 5 MG tablet Take 1 tablet (5 mg total) by mouth daily.   B-D ULTRAFINE III SHORT PEN 31G X 8 MM MISC USE AS DIRECTED   busPIRone (BUSPAR) 15 MG tablet TAKE 1 TABLET BY MOUTH THREE TIMES DAILY   Cholecalciferol (VITAMIN D3) 2000 units TABS Take 1 tablet by mouth daily.   cilostazol (PLETAL) 100 MG tablet Take 1/2 (one-half) tablet by mouth twice daily   Continuous Blood Gluc Receiver (DEXCOM G7 RECEIVER) DEVI Use with Dexcom G7 sensory   Continuous Blood Gluc Sensor (DEXCOM G7 SENSOR) MISC Use one sensor every 10 days   cyclobenzaprine (FLEXERIL) 10 MG tablet Take 1 tablet (10 mg total) by mouth at bedtime.   FLUoxetine (PROZAC) 40 MG capsule Take 1 capsule by mouth once daily   hydrochlorothiazide (MICROZIDE) 12.5 MG capsule Take 1 capsule by mouth once daily   insulin glargine (LANTUS SOLOSTAR) 100 UNIT/ML Solostar Pen Inject 16 Units into the skin 2 (two) times daily.   isosorbide mononitrate (IMDUR) 60 MG 24 hr tablet Take 1.5 tablets (90 mg total) by mouth daily.   levETIRAcetam (KEPPRA) 250 MG tablet Take 1 tablet (250 mg total) by mouth 3 (three) times daily.   metoCLOPramide (REGLAN) 5 MG tablet Take 1 tablet (5 mg total) by mouth every 8 (eight) hours as needed for nausea.   nitroGLYCERIN (NITROSTAT) 0.4 MG SL tablet Place 1 tablet (0.4 mg total) under the tongue every 5 (five) minutes x 3 doses as needed for chest pain.   nystatin cream (MYCOSTATIN) Apply 1 application topically 2 (two) times daily.   OZEMPIC, 0.25 OR 0.5 MG/DOSE, 2 MG/1.5ML SOPN INJECT 0.25MG  SUBCUTANEOUSLY ONCE  A WEEK   pravastatin (PRAVACHOL) 40 MG tablet Take 1 tablet (40 mg total) by mouth at bedtime.   pregabalin (LYRICA) 100 MG capsule Take 1 capsule (100 mg total) by mouth 2 (two) times daily.   propranolol (INDERAL) 40 MG tablet Take 1 tablet by mouth twice daily   RELION PEN NEEDLES 31G X 6 MM MISC 1 each by Other route as directed.   No facility-administered encounter medications on file as of 07/29/2022.  Fill History:   Dispensed Days Supply Quantity Provider Pharmacy  AMLODIPINE 5MG  TAB 07/18/2022 90       Dispensed Days Supply Quantity Provider Pharmacy  BusPIRone 15MG       TAB 07/20/2022 90       Dispensed Days Supply Quantity Provider Pharmacy  CILOSTAZOL 100MG     TAB 07/20/2022 90       Dispensed Days Supply Quantity Provider Pharmacy  [facility] 10MG    TAB 06/29/2021 90       Dispensed Days Supply Quantity Provider Pharmacy  FLUOXETINE 40MG  CAP 05/10/2022 90       Dispensed Days Supply Quantity Provider Pharmacy  HYDROCHLOROTHIAZIDE 12.5MG  CAP 03/16/2022 90       Dispensed Days Supply Quantity Provider Pharmacy  LANTUS SOLOSTAR PEN INJ 05/10/2022 93       Dispensed Days Supply Quantity Provider Pharmacy  ISOSORB [facility] ER 60MG  TAB 07/16/2022 90       Dispensed Days Supply Quantity Provider Pharmacy  LEVETIRACETAM  250 MG TABS 07/20/2022 90       Dispensed Days Supply Quantity Provider Pharmacy  NITROGLYCER 0.4MG    SUB 09/09/2020 30       Dispensed Days Supply Quantity Provider Pharmacy  NYSTATIN 100000 U    CRE 10/16/2021 10       Dispensed Days Supply Quantity Provider Pharmacy  PRAVASTATIN 40MG     TAB 05/19/2022 90       Dispensed Days Supply Quantity Provider Pharmacy  PREGABALIN 100MG  CAP 07/06/2022 90       Dispensed Days Supply Quantity Provider Pharmacy  PROPRANOLOL HCL  40 MG TABS  Dispensed Days Supply Quantity Provider Pharmacy  [place]  2 MG/3ML SOPN 05/11/2022 42      07/20/2022 90      Recent Relevant Labs: Lab Results  Component Value  Date/Time   HGBA1C 8.1 (H) 05/06/2022 02:38 PM   HGBA1C 7.3 (H) 02/24/2022 03:08 PM   MICROALBUR 1.5 02/24/2022 03:08 PM   MICROALBUR 1.4 04/05/2013 02:05 PM    Kidney Function Lab Results  Component Value Date/Time   CREATININE 1.33 (H) 06/03/2022 03:48 PM   CREATININE 1.48 (H) 05/06/2022 02:38 PM   CREATININE 0.9 03/12/2015 11:15 AM   GFR 42.35 (L) 06/03/2022 03:48 PM   GFRNONAA 54 (L) 08/21/2018 10:07 PM   GFRAA >60 08/21/2018 10:07 PM    Current antihyperglycemic regimen:  Lantus 16 units twice daily  Ozempic 0.25 mg inject once weekly  What recent interventions/DTPs have been made to improve glycemic control: No recent interventions.  Have there been any recent hospitalizations or ED visits since last visit with CPP? No recent hospital visits  Patient  hypoglycemic symptoms, including   Patient  hyperglycemic symptoms, including   How often are you checking your blood sugar?  What are your blood sugars ranging?    During the week, how often does your blood glucose drop below 70?   Are you checking your feet daily/regularly?   Adherence Review: Is the patient currently on a STATIN medication? Yes Is the patient currently on ACE/ARB medication? No Does the patient have >5 day gap between last estimated fill dates? Yes  Unable to reach patient after several attempts  Care Gaps: AWV - completed 02/23/2022  Last BP - 140/60 on 05/06/2022 Last A1C - 8.1 on 05/06/2022 Covid - never done Pap smear - never done Singrix - never done Lung Ca Screen - overdue Eye exam - overdue Flu - postponed Mammogram - postponed Colonoscopy - postponed Tdap - postponed  Star Rating Drugs: Ozempic 2mg /18ml - last filled 05/11/2022 42 DS at [facility] verified with pharm tech Pravastatin 40 mg - last filled 05/19/2022 90 DS at [place] Pharmacist Assistant [phone]

## 2022-08-03 ENCOUNTER — Ambulatory Visit (INDEPENDENT_AMBULATORY_CARE_PROVIDER_SITE_OTHER): Payer: Medicare Other | Admitting: Adult Health

## 2022-08-03 ENCOUNTER — Encounter: Payer: Self-pay | Admitting: Adult Health

## 2022-08-03 VITALS — BP 120/60 | HR 67 | Temp 97.4°F | Ht 63.0 in | Wt 128.0 lb

## 2022-08-03 DIAGNOSIS — I10 Essential (primary) hypertension: Secondary | ICD-10-CM | POA: Diagnosis not present

## 2022-08-03 DIAGNOSIS — J0101 Acute recurrent maxillary sinusitis: Secondary | ICD-10-CM | POA: Diagnosis not present

## 2022-08-03 DIAGNOSIS — E118 Type 2 diabetes mellitus with unspecified complications: Secondary | ICD-10-CM

## 2022-08-03 LAB — POCT GLYCOSYLATED HEMOGLOBIN (HGB A1C): Hemoglobin A1C: 7 % — AB (ref 4.0–5.6)

## 2022-08-03 MED ORDER — AMOXICILLIN-POT CLAVULANATE 875-125 MG PO TABS: 1.0000 | ORAL_TABLET | Freq: Two times a day (BID) | ORAL | 0 refills | Status: AC

## 2022-08-03 NOTE — Progress Notes (Signed)
Subjective:    Patient ID: [name], female    DOB: [birth year], 66 y.o.   MRN: [id number]  HPI 66 year old female who  has a past medical history of Acute MI, inferior wall, initial episode of care [facility]) (07/08/2012), Anxiety, Asthma, Coronary artery disease, Depression, Diabetes mellitus, Fibromyalgia, Leukocytosis, Monocytosis, Peripheral arterial disease ([place]) (01/23/2013), and Tobacco use disorder (07/08/2012).  She presents to the office today for three month follow up regarding DM and HTN   DM Type -she was approved for patient assistance for Victoza about a week and a half ago, medication will be shipping to our office but has not been received yet.  She was on Ozempic in the past but this caused severe decreased appetite, nausea, and vomiting.  She is not taken her Ozempic in the last month and finally feels as though she is able to eat food without difficulty.  She is currently maintained on Lantus 16 units daily.  A1c in August was 8.1.   HTN - managed with Norvasc 5 mg daily.  She denies dizziness, lightheadedness, chest pain, shortness of breath  BP Readings from Last 3 Encounters:  08/03/22 120/60  05/06/22 (!) 140/60  02/24/22 110/60   Additionally, she feels as though she may have a sinus infection, her symptoms started a couple weeks ago.  Symptoms include rhinorrhea with purulent drainage, sinus pain and pressure, headaches, and generally not feeling well.   Review of Systems See HPI   Past Medical History:  Diagnosis Date   Acute MI, inferior wall, initial episode of care ([place]) 07/08/2012   Cath-07/08/11 -distal RCA stent DES (99%), LAD 20-30%. EF 50% inferior hypokinesis  (infarct)   Anxiety    Asthma    Coronary artery disease    Depression    Diabetes mellitus    Fibromyalgia    Leukocytosis    Monocytosis    Peripheral arterial disease ([place]) 01/23/2013   Tobacco use disorder 07/08/2012    Social History   Socioeconomic History   Marital status:  Married    Spouse name: Not on file   Number of children: 3   Years of education: college   Highest education level: Not on file  Occupational History   Not on file  Tobacco Use   Smoking status: Every Day    Packs/day: 1.00    Years: 43.00    Total pack years: 43.00    Types: Cigarettes   Smokeless tobacco: Never  Substance and Sexual Activity   Alcohol use: No    Alcohol/week: 0.0 standard drinks of alcohol    Comment: Rarely   Drug use: No   Sexual activity: Yes  Other Topics Concern   Not on file  Social History Narrative   Lives with husband, married for 20 years.      They have three grown chlildren.   She is not currently working and is on disability   Highest level of education:  Forensic psychologist   Pets: two dogs and three rabbits.       Husband is a long Associate Professor.       Is unable to enjoy any activities because of body pains.       Social Determinants of Health   Financial Resource Strain: Medium Risk (06/14/2022)   Overall Financial Resource Strain (CARDIA)    Difficulty of Paying Living Expenses: Somewhat hard  Food Insecurity: No Food Insecurity (02/23/2022)   Hunger Vital Sign    Worried About Running Out  of Food in the Last Year: Never true    [place] in the Last Year: Never true  Transportation Needs: No Transportation Needs (02/23/2022)   PRAPARE - Hydrologist (Medical): No    Lack of Transportation (Non-Medical): No  Physical Activity: Inactive (02/23/2022)   Exercise Vital Sign    Days of Exercise per Week: 0 days    Minutes of Exercise per Session: 0 min  Stress: No Stress Concern Present (02/23/2022)   [place]    Feeling of Stress : Not at all  Social Connections: Socially Isolated (02/23/2022)   Social Connection and Isolation Panel [NHANES]    Frequency of Communication with Friends and Family: Once a week    Frequency of Social  Gatherings with Friends and Family: Once a week    Attends Religious Services: Never    Marine scientist or Organizations: No    Attends Archivist Meetings: Never    Marital Status: Married  Human resources officer Violence: Not At Risk (02/19/2021)   Humiliation, Afraid, Rape, and Kick questionnaire    Fear of Current or Ex-Partner: No    Emotionally Abused: No    Physically Abused: No    Sexually Abused: No    Past Surgical History:  Procedure Laterality Date   ABDOMINAL AORTAGRAM N/A 02/07/2013   Procedure: ABDOMINAL Maxcine Ham;  Surgeon: Wellington Hampshire, MD;  Location: [facility] CATH LAB;  Service: Cardiovascular;  Laterality: N/A;   CARDIAC CATHETERIZATION     CORONARY STENT PLACEMENT     CYST REMOVAL TRUNK  1977   tailbone   LEFT HEART CATHETERIZATION WITH CORONARY ANGIOGRAM N/A 07/07/2012   Procedure: LEFT HEART CATHETERIZATION WITH CORONARY ANGIOGRAM;  Surgeon: Burnell Blanks, MD;  Location: [facility] CATH LAB;  Service: Cardiovascular;  Laterality: N/A;   PERCUTANEOUS CORONARY STENT INTERVENTION (PCI-S)  07/07/2012   Procedure: PERCUTANEOUS CORONARY STENT INTERVENTION (PCI-S);  Surgeon: Burnell Blanks, MD;  Location: [facility] CATH LAB;  Service: Cardiovascular;;   TONSILLECTOMY      Family History  Problem Relation Age of Onset   Kidney disease Mother    Diabetes Mother        DM Type II   Alzheimer's disease Mother    Diabetes Father        IDDM   Heart disease Father    Cancer Father        bone cancer died at 84   Cancer Sister        bone cancer at age 58   Multiple sclerosis Daughter    Diabetes Son 2       DM Type I   Heart disease Maternal Grandfather    Heart disease Paternal Grandmother    Diabetes Son 46       DM Type I    Allergies  Allergen Reactions   Erythromycin Other (See Comments)   Statins     SEVERE MUSCLE PAIN - failed lipitor, Livalo, red yeast rice (lovastatin), and she thinks Zocor and pravastatin   Ace Inhibitors Cough     Patient refuses to take meds   Cymbalta [Duloxetine Hcl]     Drowsiness    Current Outpatient Medications on File Prior to Visit  Medication Sig Dispense Refill   amLODipine (NORVASC) 5 MG tablet Take 1 tablet (5 mg total) by mouth daily. 90 tablet 2   busPIRone (BUSPAR) 15 MG tablet TAKE 1 TABLET BY MOUTH  THREE TIMES DAILY 270 tablet 0   Cholecalciferol (VITAMIN D3) 2000 units TABS Take 1 tablet by mouth daily.     cilostazol (PLETAL) 100 MG tablet Take 1/2 (one-half) tablet by mouth twice daily 90 tablet 0   Continuous Blood Gluc Receiver (DEXCOM G7 RECEIVER) DEVI Use with Dexcom G7 sensory 1 each 0   Continuous Blood Gluc Sensor (DEXCOM G7 SENSOR) MISC Use one sensor every 10 days 3 each 12   cyclobenzaprine (FLEXERIL) 10 MG tablet Take 1 tablet (10 mg total) by mouth at bedtime. 90 tablet 0   FLUoxetine (PROZAC) 40 MG capsule Take 1 capsule by mouth once daily 90 capsule 0   insulin glargine (LANTUS SOLOSTAR) 100 UNIT/ML Solostar Pen Inject 16 Units into the skin 2 (two) times daily. 30 mL 0   isosorbide mononitrate (IMDUR) 60 MG 24 hr tablet Take 1.5 tablets (90 mg total) by mouth daily. 135 tablet 3   levETIRAcetam (KEPPRA) 250 MG tablet Take 1 tablet (250 mg total) by mouth 3 (three) times daily. 270 tablet 1   metoCLOPramide (REGLAN) 5 MG tablet Take 1 tablet (5 mg total) by mouth every 8 (eight) hours as needed for nausea. 30 tablet 1   nitroGLYCERIN (NITROSTAT) 0.4 MG SL tablet Place 1 tablet (0.4 mg total) under the tongue every 5 (five) minutes x 3 doses as needed for chest pain. 30 tablet 12   nystatin cream (MYCOSTATIN) Apply 1 application topically 2 (two) times daily. 30 g 2   pravastatin (PRAVACHOL) 40 MG tablet Take 1 tablet (40 mg total) by mouth at bedtime. 90 tablet 1   pregabalin (LYRICA) 100 MG capsule Take 1 capsule (100 mg total) by mouth 2 (two) times daily. 180 capsule 1   propranolol (INDERAL) 40 MG tablet Take 1 tablet by mouth twice daily 180 tablet 0   RELION  PEN NEEDLES 31G X 6 MM MISC 1 each by Other route as directed. 50 each 2   No current facility-administered medications on file prior to visit.    BP 120/60   Pulse 67   Temp (!) 97.4 F (36.3 C) (Oral)   Ht 5\' 3"  (1.6 m)   Wt 128 lb (58.1 kg)   SpO2 95%   BMI 22.67 kg/m       Objective:   Physical Exam Vitals and nursing note reviewed.  Constitutional:      Appearance: Normal appearance.  HENT:     Nose: Congestion and rhinorrhea present. Rhinorrhea is purulent.     Right Sinus: Maxillary sinus tenderness and frontal sinus tenderness present.     Left Sinus: Maxillary sinus tenderness and frontal sinus tenderness present.  Cardiovascular:     Rate and Rhythm: Normal rate and regular rhythm.     Pulses: Normal pulses.     Heart sounds: Normal heart sounds.  Pulmonary:     Effort: Pulmonary effort is normal.     Breath sounds: Normal breath sounds.  Musculoskeletal:        General: Normal range of motion.  Skin:    General: Skin is warm and dry.  Neurological:     General: No focal deficit present.     Mental Status: She is alert and oriented to person, place, and time.  Psychiatric:        Mood and Affect: Mood normal.        Behavior: Behavior normal.        Thought Content: Thought content normal.  Judgment: Judgment normal.       Assessment & Plan:  1. Type 2 diabetes mellitus with complication, without long-term current use of insulin (HCC)  - POC HgB A1c- 7.0  - Controlled.  - Will wait for Victoza to come in   2. Essential hypertension - Well controlled. No change in medications   3. Acute recurrent maxillary sinusitis -  - amoxicillin-clavulanate (AUGMENTIN) 875-125 MG tablet; Take 1 tablet by mouth 2 (two) times daily for 7 days.  Dispense: 14 tablet; Refill: 0   [name], NP

## 2022-08-15 ENCOUNTER — Other Ambulatory Visit: Payer: Self-pay | Admitting: Adult Health

## 2022-08-15 DIAGNOSIS — E1165 Type 2 diabetes mellitus with hyperglycemia: Secondary | ICD-10-CM

## 2022-08-17 NOTE — Telephone Encounter (Unsigned)
Pt is due for a Dm follow-up. I don not see any scheduled. Called pt to set this up but no answer. Will call back shortly and send Rx in.

## 2022-08-17 NOTE — Telephone Encounter (Signed)
-----   Message from Verner Chol, [facility] sent at 08/16/2022  1:47 PM EST ----- Regarding: Dexcom G7 sensors refill Hi,  Can you please send in a refill for Ms. Adrian-Munro's Dexcom 67 sensors to [facility]? She runs out today.  Thanks! Maddie

## 2022-08-18 ENCOUNTER — Other Ambulatory Visit: Payer: Self-pay

## 2022-08-18 MED ORDER — DEXCOM G7 RECEIVER DEVI: 0 refills | Status: DC

## 2022-08-18 NOTE — Telephone Encounter (Signed)
Entered in error

## 2022-08-25 NOTE — Telephone Encounter (Signed)
Patient's husband came and picked up medication.      FYI

## 2022-08-25 NOTE — Telephone Encounter (Signed)
Noted  

## 2022-08-26 ENCOUNTER — Encounter: Payer: Self-pay | Admitting: Adult Health

## 2022-08-26 NOTE — Telephone Encounter (Signed)
Please advise 

## 2022-09-18 ENCOUNTER — Other Ambulatory Visit: Payer: Self-pay | Admitting: Adult Health

## 2022-09-21 ENCOUNTER — Other Ambulatory Visit: Payer: Self-pay | Admitting: Adult Health

## 2022-09-21 NOTE — Telephone Encounter (Signed)
Okay for refill?    Please advise on dosage

## 2022-09-22 MED ORDER — FLUOXETINE HCL 40 MG PO CAPS: 40.0000 mg | ORAL_CAPSULE | Freq: Every day | ORAL | 0 refills | Status: DC

## 2022-09-22 NOTE — Telephone Encounter (Signed)
Okay for refill?   Please advise of the dosage

## 2022-09-23 MED ORDER — CILOSTAZOL 100 MG PO TABS: 50.0000 mg | ORAL_TABLET | Freq: Two times a day (BID) | ORAL | 3 refills | Status: DC

## 2022-09-28 ENCOUNTER — Encounter: Payer: Self-pay | Admitting: Pharmacist

## 2022-09-28 ENCOUNTER — Other Ambulatory Visit: Payer: Self-pay | Admitting: Adult Health

## 2022-09-28 DIAGNOSIS — R569 Unspecified convulsions: Secondary | ICD-10-CM

## 2022-09-28 NOTE — Chronic Care Management (AMB) (Cosign Needed)
Reason for Encounter: Hypertension  Contacted patient on 09/28/2021 to discuss hypertension disease state.   Recent office visits:  08/03/2022 Sydney Peng NP - Patient was seen for Type 2 diabetes mellitus with complication, without long-term current use of insulin and additional concerns.   Recent consult visits:  None  Hospital visits:  None  Medications: Outpatient Encounter Medications as of 09/28/2022  Medication Sig   amLODipine (NORVASC) 5 MG tablet Take 1 tablet (5 mg total) by mouth daily.   busPIRone (BUSPAR) 15 MG tablet TAKE 1 TABLET BY MOUTH THREE TIMES DAILY   Cholecalciferol (VITAMIN D3) 2000 units TABS Take 1 tablet by mouth daily.   cilostazol (PLETAL) 100 MG tablet Take 0.5 tablets (50 mg total) by mouth 2 (two) times daily.   Continuous Blood Gluc Receiver (DEXCOM G7 RECEIVER) DEVI Use with Dexcom G7 sensory   Continuous Blood Gluc Sensor (DEXCOM G7 SENSOR) MISC Use one sensor every 10 days   cyclobenzaprine (FLEXERIL) 10 MG tablet Take 1 tablet (10 mg total) by mouth at bedtime.   FLUoxetine (PROZAC) 40 MG capsule Take 1 capsule (40 mg total) by mouth daily.   isosorbide mononitrate (IMDUR) 60 MG 24 hr tablet Take 1.5 tablets (90 mg total) by mouth daily.   LANTUS SOLOSTAR 100 UNIT/ML Solostar Pen INJECT 16 UNITS SUBCUTANEOUSLY TWICE DAILY   levETIRAcetam (KEPPRA) 250 MG tablet Take 1 tablet (250 mg total) by mouth 3 (three) times daily.   metoCLOPramide (REGLAN) 5 MG tablet Take 1 tablet (5 mg total) by mouth every 8 (eight) hours as needed for nausea.   nitroGLYCERIN (NITROSTAT) 0.4 MG SL tablet Place 1 tablet (0.4 mg total) under the tongue every 5 (five) minutes x 3 doses as needed for chest pain.   nystatin cream (MYCOSTATIN) Apply 1 application topically 2 (two) times daily.   pravastatin (PRAVACHOL) 40 MG tablet Take 1 tablet (40 mg total) by mouth at bedtime.   pregabalin (LYRICA) 100 MG capsule Take 1 capsule (100 mg total) by mouth 2 (two) times daily.    propranolol (INDERAL) 40 MG tablet Take 1 tablet by mouth twice daily   RELION PEN NEEDLES 31G X 6 MM MISC 1 each by Other route as directed.   No facility-administered encounter medications on file as of 09/28/2022.    Recent Office Vitals: BP Readings from Last 3 Encounters:  08/03/22 120/60  05/06/22 (!) 140/60  02/24/22 110/60   Pulse Readings from Last 3 Encounters:  08/03/22 67  05/06/22 84  02/24/22 95    Wt Readings from Last 3 Encounters:  08/03/22 128 lb (58.1 kg)  05/06/22 129 lb (58.5 kg)  02/24/22 142 lb (64.4 kg)     Kidney Function Lab Results  Component Value Date/Time   CREATININE 1.33 (H) 06/03/2022 03:48 PM   CREATININE 1.48 (H) 05/06/2022 02:38 PM   CREATININE 0.9 03/12/2015 11:15 AM   GFR 42.35 (L) 06/03/2022 03:48 PM   GFRNONAA 54 (L) 08/21/2018 10:07 PM   GFRAA >60 08/21/2018 10:07 PM       Latest Ref Rng & Units 06/03/2022    3:48 PM 05/06/2022    2:38 PM 02/24/2022    3:08 PM  BMP  Glucose 70 - 99 mg/dL 168  226  131   BUN 6 - 23 mg/dL 15  14  19    Creatinine 0.40 - 1.20 mg/dL 1.33  1.48  1.62   Sodium 135 - 145 mEq/L 139  139  138   Potassium 3.5 - 5.1 mEq/L  4.0  3.8  4.1   Chloride 96 - 112 mEq/L 105  99  100   CO2 19 - 32 mEq/L 24  26  24    Calcium 8.4 - 10.5 mg/dL 9.3  10.1  11.1   Fill History:   Dispensed Days Supply Quantity Provider Pharmacy  AMLODIPINE 5MG  TAB 07/18/2022 90 90 each      Dispensed Days Supply Quantity Provider Pharmacy  BusPIRone 15MG       TAB 07/20/2022 90 270 each      Dispensed Days Supply Quantity Provider Pharmacy  CILOSTAZOL 100MG     TAB 07/20/2022 90 90 each      Dispensed Days Supply Quantity Provider Pharmacy  CYCLOBENZAPR 10MG    TAB 06/29/2021 90 90 each      Dispensed Days Supply Quantity Provider Pharmacy  FLUOXETINE 40MG  CAP 08/13/2022 90 90 each      Dispensed Days Supply Quantity Provider Pharmacy  LANTUS SOLOSTAR PEN INJ 08/18/2022 94 30 mL      Dispensed Days Supply Quantity Provider  Pharmacy  LEVETIRACETAM  250 MG TABS 07/20/2022 90 270 tablet      Dispensed Days Supply Quantity Provider Pharmacy  METOCLOPRAM 5MG      TAB 05/06/2022 10 30 each      Dispensed Days Supply Quantity Provider Pharmacy  NITROGLYCER 0.4MG    SUB 09/09/2020 30 25 each      Dispensed Days Supply Quantity Provider Pharmacy  PRAVASTATIN 40MG     TAB 05/19/2022 90 90 each      Dispensed Days Supply Quantity Provider Pharmacy  PREGABALIN 100MG  CAP 07/06/2022 90 180 each      Dispensed Days Supply Quantity Provider Pharmacy  PROPRANOLOL 40MG     TAB 08/13/2022 90 180 each      Current antihypertensive regimen:  Amlodipine 5 mg daily Isosorbide 60 mg daily Propranolol 40 mg twice daily  Patient verbally confirms she is taking the above medications as directed. {yes/no:20286}  How often are you checking your Blood Pressure? {CHL HP BP Monitoring Frequency:[phone]}  she checks her blood pressure {timing:25218} {before/after:25217} taking her medication.  Current home BP readings: ***  DATE:             BP               PULSE  Wrist or arm cuff:  Caffeine intake:  Salt intake:  OTC medications including pseudoephedrine or NSAIDs?  Any readings above 180/120? {yes/no:20286} If yes any symptoms of hypertensive emergency? {hypertensive emergency symptoms:25354}   What recent interventions/DTPs have been made by any provider to improve Blood Pressure control since last CPP Visit: No recent interventions have been noted.   Any recent hospitalizations or ED visits since last visit with CPP? No recent hospital visits noted.   What diet changes have been made to improve Blood Pressure Control?  Patient follows  [place] exercise is being done to improve your Blood Pressure Control?  ***  Adherence Review: Is the patient currently on ACE/ARB medication? No Does the patient have >5 day gap between last estimated fill dates? No  Star Rating  Drugs:  Pravastatin 40 mg - last filled 05/19/2022 90 DS at [facility] verified with pharmacy.   [place] Pharmacist Assistant [phone]

## 2022-09-30 NOTE — Progress Notes (Deleted)
Established Patient Office Visit  Subjective   Patient ID: [name], female    DOB: [birth year]  Age: 67 y.o. MRN: [id number]  Chief Complaint  Patient presents with   Care Coordination    HPI  {History (Optional):23778}  ROS    Objective:     There were no vitals taken for this visit. {Vitals History (Optional):23777}  Physical Exam   No results found for any visits on 09/28/22.  {Labs (Optional):23779}  The ASCVD Risk score (Arnett DK, et al., 2019) failed to calculate for the following reasons:   The patient has a prior MI or stroke diagnosis    Assessment & Plan:   Problem List Items Addressed This Visit   None   No follow-ups on file.    [place] Coordination Pharmacy Assistant   Name: Yvonne Stopher  MRN: [id number] DOB: 1956-06-30  Care Management & Coordination Services Pharmacy Team  Reason for Encounter: Diabetes  Contacted patient on 09/30/2022 to discuss diabetes disease state.   Recent office visits:  08/03/2022 [name] NP - Patient was seen for  Type 2 diabetes mellitus with complication, without long-term current use of insulin and additional concerns. Started Augmentin. Discontinued HCTZ and Ozempic.   Recent consult visits:  None  Hospital visits:  None  Medications: Outpatient Encounter Medications as of 09/28/2022  Medication Sig   amLODipine (NORVASC) 5 MG tablet Take 1 tablet (5 mg total) by mouth daily.   busPIRone (BUSPAR) 15 MG tablet TAKE 1 TABLET BY MOUTH THREE TIMES DAILY   Cholecalciferol (VITAMIN D3) 2000 units TABS Take 1 tablet by mouth daily.   cilostazol (PLETAL) 100 MG tablet Take 0.5 tablets (50 mg total) by mouth 2 (two) times daily.   Continuous Blood Gluc Receiver (DEXCOM G7 RECEIVER) DEVI Use with Dexcom G7 sensory   Continuous Blood Gluc Sensor (DEXCOM G7 SENSOR) MISC Use one sensor every 10 days   cyclobenzaprine (FLEXERIL) 10 MG tablet Take 1 tablet (10 mg total) by mouth at  bedtime.   FLUoxetine (PROZAC) 40 MG capsule Take 1 capsule (40 mg total) by mouth daily.   isosorbide mononitrate (IMDUR) 60 MG 24 hr tablet Take 1.5 tablets (90 mg total) by mouth daily.   LANTUS SOLOSTAR 100 UNIT/ML Solostar Pen INJECT 16 UNITS SUBCUTANEOUSLY TWICE DAILY   metoCLOPramide (REGLAN) 5 MG tablet Take 1 tablet (5 mg total) by mouth every 8 (eight) hours as needed for nausea.   nitroGLYCERIN (NITROSTAT) 0.4 MG SL tablet Place 1 tablet (0.4 mg total) under the tongue every 5 (five) minutes x 3 doses as needed for chest pain.   nystatin cream (MYCOSTATIN) Apply 1 application topically 2 (two) times daily.   pravastatin (PRAVACHOL) 40 MG tablet Take 1 tablet (40 mg total) by mouth at bedtime.   pregabalin (LYRICA) 100 MG capsule Take 1 capsule (100 mg total) by mouth 2 (two) times daily.   propranolol (INDERAL) 40 MG tablet Take 1 tablet by mouth twice daily   RELION PEN NEEDLES 31G X 6 MM MISC 1 each by Other route as directed.   [DISCONTINUED] levETIRAcetam (KEPPRA) 250 MG tablet Take 1 tablet (250 mg total) by mouth 3 (three) times daily.   No facility-administered encounter medications on file as of 09/28/2022.    Recent Relevant Labs: Lab Results  Component Value Date/Time   HGBA1C 7.0 (A) 08/03/2022 11:09 AM   HGBA1C 8.1 (H) 05/06/2022 02:38 PM   HGBA1C 7.3 (H) 02/24/2022 03:08 PM   MICROALBUR 1.5 02/24/2022  03:08 PM   MICROALBUR 1.4 04/05/2013 02:05 PM    Kidney Function Lab Results  Component Value Date/Time   CREATININE 1.33 (H) 06/03/2022 03:48 PM   CREATININE 1.48 (H) 05/06/2022 02:38 PM   CREATININE 0.9 03/12/2015 11:15 AM   GFR 42.35 (L) 06/03/2022 03:48 PM   GFRNONAA 54 (L) 08/21/2018 10:07 PM   GFRAA >60 08/21/2018 10:07 PM    Current antihyperglycemic regimen:  Lantus Solostar 16 units twice daily      What diet changes have been made to improve diabetes control?  What recent interventions/DTPs have been made to improve glycemic control:  No recent  interventions noted  Have there been any recent hospitalizations or ED visits since last visit with PharmD? No recent hospital visits noted  Patient hypoglycemic symptoms, including   Patient hyperglycemic symptoms, including   How often are you checking your blood sugar?   What are your blood sugars ranging?  Fasting:  Before meals:  After meals:  Bedtime:   During the week, how often does your blood glucose drop below 70?   Are you checking your feet daily/regularly?   Unable to reach patient after several attempts.  Adherence Review: Is the patient currently on a STATIN medication? Yes Is the patient currently on ACE/ARB medication? No Does the patient have >5 day gap between last estimated fill dates? No  Care Gaps: AWV - completed 02/23/2022  Last BP - 120/60 on 08/03/2022 Last A1C - 7.0 on 08/03/2022 Covid - never done Tdap - never done Pap - never done Shingrix - never done Lung Cancer Screen - overdue Eye exam - overdue Flu - postponed Mammogram - postponed Colonoscopy - postponed  Star Rating Drugs: Pravastatin 40 mg - last filled 05/19/2022 90 DS at [facility], verified with [place] Pharmacist Assistant [phone]

## 2022-09-30 NOTE — Progress Notes (Deleted)
   Established Patient Office Visit  Subjective   Patient ID: [name], female    DOB: 04/06/1956  Age: 66 y.o. MRN: [id number]  Chief Complaint  Patient presents with   Care Coordination    HPI  {History (Optional):23778}  ROS    Objective:     There were no vitals taken for this visit. {Vitals History (Optional):23777}  Physical Exam   No results found for any visits on 09/28/22.  {Labs (Optional):23779}  The ASCVD Risk score (Arnett DK, et al., 2019) failed to calculate for the following reasons:   The patient has a prior MI or stroke diagnosis    Assessment & Plan:   Problem List Items Addressed This Visit   None   No follow-ups on file.    Janie L Johnson  

## 2022-09-30 NOTE — Progress Notes (Deleted)
   Established Patient Office Visit  Subjective   Patient ID: Krissy Adrian-Munro, female    DOB: 11/04/1955  Age: 67 y.o. MRN: [id number]  Chief Complaint  Patient presents with   Care Coordination    HPI  {History (Optional):23778}  ROS    Objective:     There were no vitals taken for this visit. {Vitals History (Optional):23777}  Physical Exam   No results found for any visits on 09/28/22.  {Labs (Optional):23779}  The ASCVD Risk score (Arnett DK, et al., 2019) failed to calculate for the following reasons:   The patient has a prior MI or stroke diagnosis    Assessment & Plan:   Problem List Items Addressed This Visit   None   No follow-ups on file.    Janie L Johnson  

## 2022-09-30 NOTE — Progress Notes (Deleted)
   Established Patient Office Visit  Subjective   Patient ID: [name], female    DOB: [birth year]  Age: 67 y.o. MRN: [id number]  Chief Complaint  Patient presents with   Care Coordination   Error    HPI  {History (Optional):23778}  ROS    Objective:     There were no vitals taken for this visit. {Vitals History (Optional):23777}  Physical Exam   No results found for any visits on 09/28/22.  {Labs (Optional):23779}  The ASCVD Risk score (Arnett DK, et al., 2019) failed to calculate for the following reasons:   The patient has a prior MI or stroke diagnosis    Assessment & Plan:   Problem List Items Addressed This Visit   None Visit Diagnoses     Erroneous encounter - disregard    -  Primary       No follow-ups on file.    Pearson Forster

## 2022-09-30 NOTE — Telephone Encounter (Deleted)
This encounter was created in error - please disregard.

## 2022-09-30 NOTE — Progress Notes (Deleted)
   Established Patient Office Visit  Subjective   Patient ID: [name], female    DOB: 03-07-1956  Age: 67 y.o. MRN: [id number]  Chief Complaint  Patient presents with   Care Coordination    HPI  {History (Optional):23778}  ROS    Objective:     There were no vitals taken for this visit.   Physical Exam   No results found for any visits on 09/28/22.    The ASCVD Risk score (Arnett DK, et al., 2019) failed to calculate for the following reasons:   The patient has a prior MI or stroke diagnosis    Assessment & Plan:   Problem List Items Addressed This Visit   None   No follow-ups on file.    Pearson Forster

## 2022-09-30 NOTE — Progress Notes (Deleted)
   Established Patient Office Visit  Subjective   Patient ID: [name], female    DOB: 07-Jul-1956  Age: 67 y.o. MRN: [id number]  Chief Complaint  Patient presents with   Care Coordination    HPI  {History (Optional):23778}  ROS    Objective:     There were no vitals taken for this visit. {Vitals History (Optional):23777}  Physical Exam   No results found for any visits on 09/28/22.  {Labs (Optional):23779}  The ASCVD Risk score (Arnett DK, et al., 2019) failed to calculate for the following reasons:   The patient has a prior MI or stroke diagnosis    Assessment & Plan:   Problem List Items Addressed This Visit   None   No follow-ups on file.    Pearson Forster

## 2022-10-13 ENCOUNTER — Telehealth: Payer: Self-pay | Admitting: Adult Health

## 2022-10-13 NOTE — Telephone Encounter (Signed)
disregard

## 2022-10-14 ENCOUNTER — Ambulatory Visit: Payer: Medicare Other | Admitting: Adult Health

## 2022-10-15 ENCOUNTER — Encounter: Payer: Self-pay | Admitting: Adult Health

## 2022-10-15 ENCOUNTER — Ambulatory Visit (INDEPENDENT_AMBULATORY_CARE_PROVIDER_SITE_OTHER): Payer: Medicare Other | Admitting: Adult Health

## 2022-10-15 VITALS — BP 110/70 | HR 76 | Temp 97.7°F | Ht 63.0 in | Wt 127.0 lb

## 2022-10-15 DIAGNOSIS — J0141 Acute recurrent pansinusitis: Secondary | ICD-10-CM | POA: Diagnosis not present

## 2022-10-15 DIAGNOSIS — E118 Type 2 diabetes mellitus with unspecified complications: Secondary | ICD-10-CM | POA: Diagnosis not present

## 2022-10-15 MED ORDER — CEPHALEXIN 500 MG PO CAPS: 500.0000 mg | ORAL_CAPSULE | Freq: Three times a day (TID) | ORAL | 0 refills | Status: AC

## 2022-10-15 NOTE — Progress Notes (Signed)
Subjective:    Patient ID: [name], female    DOB: [birth year], 67 y.o.   MRN: [id number]  HPI  67 year old female who  has a past medical history of Acute MI, inferior wall, initial episode of care [facility]) (07/08/2012), Anxiety, Asthma, Coronary artery disease, Depression, Diabetes mellitus, Fibromyalgia, Leukocytosis, Monocytosis, Peripheral arterial disease ([place]) (01/23/2013), and Tobacco use disorder (07/08/2012).  She presents to the office today for an acute on chronic issue of sinusitis.  She reports her symptoms have been present for roughly 3 to 4 weeks.  Symptoms include low-grade subjective fever, chills, diaphoresis, headache, sinus pain and pressure, and nasal drainage that is clear and bloody in nature.  She has been using various over-the-counter cold medications without resolution.  Additionally, she reports that she tried increasing her Victoza to 1.6 mg daily but suffered from nausea and vomiting due to this.  She has gone down to 0.6 mg and is tolerating this a little bit better but continues to have nausea for the first 3 days of taking that.  She did not have any side effects prior to the increase.  She reports that her blood sugars have been really well-controlled low 100s with the 0.6 mg dose   Review of Systems See HPI   Past Medical History:  Diagnosis Date   Acute MI, inferior wall, initial episode of care [facility]) 07/08/2012   Cath-07/08/11 -distal RCA stent DES (99%), LAD 20-30%. EF 50% inferior hypokinesis  (infarct)   Anxiety    Asthma    Coronary artery disease    Depression    Diabetes mellitus    Fibromyalgia    Leukocytosis    Monocytosis    Peripheral arterial disease ([place]) 01/23/2013   Tobacco use disorder 07/08/2012    Social History   Socioeconomic History   Marital status: Married    Spouse name: Not on file   Number of children: 3   Years of education: college   Highest education level: Not on file  Occupational History   Not on file   Tobacco Use   Smoking status: Every Day    Packs/day: 1.00    Years: 43.00    Total pack years: 43.00    Types: Cigarettes   Smokeless tobacco: Never  Substance and Sexual Activity   Alcohol use: No    Alcohol/week: 0.0 standard drinks of alcohol    Comment: Rarely   Drug use: No   Sexual activity: Yes  Other Topics Concern   Not on file  Social History Narrative   Lives with husband, married for 20 years.      They have three grown chlildren.   She is not currently working and is on disability   Highest level of education:  Forensic psychologist   Pets: two dogs and three rabbits.       Husband is a long Associate Professor.       Is unable to enjoy any activities because of body pains.       Social Determinants of Health   Financial Resource Strain: Medium Risk (06/14/2022)   Overall Financial Resource Strain (CARDIA)    Difficulty of Paying Living Expenses: Somewhat hard  Food Insecurity: No Food Insecurity (02/23/2022)   Hunger Vital Sign    Worried About Running Out of Food in the Last Year: Never true    Ran Out of Food in the Last Year: Never true  Transportation Needs: No Transportation Needs (02/23/2022)   PRAPARE - Transportation  Lack of Transportation (Medical): No    Lack of Transportation (Non-Medical): No  Physical Activity: Inactive (02/23/2022)   Exercise Vital Sign    Days of Exercise per Week: 0 days    Minutes of Exercise per Session: 0 min  Stress: No Stress Concern Present (02/23/2022)   [employer] of Occupational Health - Occupational Stress Questionnaire    Feeling of Stress : Not at all  Social Connections: Socially Isolated (02/23/2022)   Social Connection and Isolation Panel [NHANES]    Frequency of Communication with Friends and Family: Once a week    Frequency of Social Gatherings with Friends and Family: Once a week    Attends Religious Services: Never    Database administrator or Organizations: No    Attends Banker  Meetings: Never    Marital Status: Married  Catering manager Violence: Not At Risk (02/19/2021)   Humiliation, Afraid, Rape, and Kick questionnaire    Fear of Current or Ex-Partner: No    Emotionally Abused: No    Physically Abused: No    Sexually Abused: No    Past Surgical History:  Procedure Laterality Date   ABDOMINAL AORTAGRAM N/A 02/07/2013   Procedure: ABDOMINAL Ronny Flurry;  Surgeon: Iran Ouch, MD;  Location: [facility] CATH LAB;  Service: Cardiovascular;  Laterality: N/A;   CARDIAC CATHETERIZATION     CORONARY STENT PLACEMENT     CYST REMOVAL TRUNK  1977   tailbone   LEFT HEART CATHETERIZATION WITH CORONARY ANGIOGRAM N/A 07/07/2012   Procedure: LEFT HEART CATHETERIZATION WITH CORONARY ANGIOGRAM;  Surgeon: Kathleene Hazel, MD;  Location: [facility] CATH LAB;  Service: Cardiovascular;  Laterality: N/A;   PERCUTANEOUS CORONARY STENT INTERVENTION (PCI-S)  07/07/2012   Procedure: PERCUTANEOUS CORONARY STENT INTERVENTION (PCI-S);  Surgeon: Kathleene Hazel, MD;  Location: [facility] CATH LAB;  Service: Cardiovascular;;   TONSILLECTOMY      Family History  Problem Relation Age of Onset   Kidney disease Mother    Diabetes Mother        DM Type II   Alzheimer's disease Mother    Diabetes Father        IDDM   Heart disease Father    Cancer Father        bone cancer died at 55   Cancer Sister        bone cancer at age 65   Multiple sclerosis Daughter    Diabetes Son 2       DM Type I   Heart disease Maternal Grandfather    Heart disease Paternal Grandmother    Diabetes Son 56       DM Type I    Allergies  Allergen Reactions   Erythromycin Other (See Comments)   Statins     SEVERE MUSCLE PAIN - failed lipitor, Livalo, red yeast rice (lovastatin), and she thinks Zocor and pravastatin   Ace Inhibitors Cough    Patient refuses to take meds   Cymbalta [Duloxetine Hcl]     Drowsiness   Doxycycline Other (See Comments)    diarrhea    Current Outpatient Medications on File  Prior to Visit  Medication Sig Dispense Refill   amLODipine (NORVASC) 5 MG tablet Take 1 tablet (5 mg total) by mouth daily. 90 tablet 2   busPIRone (BUSPAR) 15 MG tablet TAKE 1 TABLET BY MOUTH THREE TIMES DAILY 270 tablet 0   Cholecalciferol (VITAMIN D3) 2000 units TABS Take 1 tablet by mouth daily.     cilostazol (PLETAL)  100 MG tablet Take 0.5 tablets (50 mg total) by mouth 2 (two) times daily. 90 tablet 3   Continuous Blood Gluc Receiver (DEXCOM G7 RECEIVER) DEVI Use with Dexcom G7 sensory 1 each 0   Continuous Blood Gluc Sensor (DEXCOM G7 SENSOR) MISC Use one sensor every 10 days 3 each 12   cyclobenzaprine (FLEXERIL) 10 MG tablet Take 1 tablet (10 mg total) by mouth at bedtime. 90 tablet 0   FLUoxetine (PROZAC) 40 MG capsule Take 1 capsule (40 mg total) by mouth daily. 90 capsule 0   isosorbide mononitrate (IMDUR) 60 MG 24 hr tablet Take 1.5 tablets (90 mg total) by mouth daily. 135 tablet 3   LANTUS SOLOSTAR 100 UNIT/ML Solostar Pen INJECT 16 UNITS SUBCUTANEOUSLY TWICE DAILY 30 mL 0   levETIRAcetam (KEPPRA) 250 MG tablet TAKE 1 TABLET BY MOUTH THREE TIMES DAILY 270 tablet 0   metoCLOPramide (REGLAN) 5 MG tablet Take 1 tablet (5 mg total) by mouth every 8 (eight) hours as needed for nausea. 30 tablet 1   nitroGLYCERIN (NITROSTAT) 0.4 MG SL tablet Place 1 tablet (0.4 mg total) under the tongue every 5 (five) minutes x 3 doses as needed for chest pain. 30 tablet 12   nystatin cream (MYCOSTATIN) Apply 1 application topically 2 (two) times daily. 30 g 2   pravastatin (PRAVACHOL) 40 MG tablet Take 1 tablet (40 mg total) by mouth at bedtime. 90 tablet 1   pregabalin (LYRICA) 100 MG capsule Take 1 capsule (100 mg total) by mouth 2 (two) times daily. 180 capsule 1   propranolol (INDERAL) 40 MG tablet Take 1 tablet by mouth twice daily 180 tablet 0   No current facility-administered medications on file prior to visit.    BP 110/70   Pulse 76   Temp 97.7 F (36.5 C) (Oral)   Ht 5\' 3"  (1.6 m)    Wt 127 lb (57.6 kg)   SpO2 96%   BMI 22.50 kg/m       Objective:   Physical Exam Vitals and nursing note reviewed.  Constitutional:      Appearance: She is well-developed.  HENT:     Nose: Congestion and rhinorrhea present. Rhinorrhea is purulent.     Right Nostril: No epistaxis.     Left Nostril: No epistaxis.     Right Turbinates: Enlarged and swollen.     Left Turbinates: Enlarged and swollen.     Right Sinus: Maxillary sinus tenderness and frontal sinus tenderness present.     Left Sinus: Maxillary sinus tenderness and frontal sinus tenderness present.  Cardiovascular:     Rate and Rhythm: Normal rate and regular rhythm.     Pulses: Normal pulses.     Heart sounds: Normal heart sounds.  Pulmonary:     Effort: Pulmonary effort is normal.     Breath sounds: Normal breath sounds.  Skin:    General: Skin is warm and dry.     Capillary Refill: Capillary refill takes less than 2 seconds.  Neurological:     General: No focal deficit present.     Mental Status: She is alert and oriented to person, place, and time.  Psychiatric:        Mood and Affect: Mood normal.        Behavior: Behavior normal.        Thought Content: Thought content normal.        Assessment & Plan:  1. Acute recurrent pansinusitis  - cephALEXin (KEFLEX) 500 MG capsule; Take 1  capsule (500 mg total) by mouth 3 (three) times daily for 7 days.  Dispense: 21 capsule; Refill: 0  2. Type 2 diabetes mellitus with complication, without long-term current use of insulin (HCC) -I am going to have her stop Victoza for the next few days and then restarted to see if her symptoms come back.  She will follow-up with me if symptoms return  [name], NP

## 2022-11-01 ENCOUNTER — Other Ambulatory Visit: Payer: Self-pay | Admitting: Adult Health

## 2022-11-01 ENCOUNTER — Encounter: Payer: Self-pay | Admitting: Adult Health

## 2022-11-01 DIAGNOSIS — I10 Essential (primary) hypertension: Secondary | ICD-10-CM

## 2022-11-02 MED ORDER — FLUOXETINE HCL 40 MG PO CAPS: 40.0000 mg | ORAL_CAPSULE | Freq: Every day | ORAL | 0 refills | Status: DC

## 2022-11-02 MED ORDER — PROPRANOLOL HCL 40 MG PO TABS: 40.0000 mg | ORAL_TABLET | Freq: Two times a day (BID) | ORAL | 0 refills | Status: DC

## 2022-11-02 NOTE — Telephone Encounter (Signed)
Please advise 

## 2022-11-05 ENCOUNTER — Other Ambulatory Visit: Payer: Self-pay | Admitting: Adult Health

## 2022-11-05 ENCOUNTER — Telehealth: Payer: Self-pay

## 2022-11-05 DIAGNOSIS — E1165 Type 2 diabetes mellitus with hyperglycemia: Secondary | ICD-10-CM

## 2022-11-05 NOTE — Progress Notes (Unsigned)
Care Management & Coordination Services Pharmacy Team  Reason for Encounter: Diabetes  Contacted patient to discuss diabetes disease state. {US HC Outreach:28874} SCHED FOLLOW UP Current antihyperglycemic regimen:  Lantus solostar 100 un/ml inject 16 units twice daily    Patient verbally confirms she is taking the above medications as directed. {yes/no:20286}  What diet changes have been made to improve diabetes control?     Diet changes  Breakfast -   Lunch -   Dinner -   Snack -  What recent interventions/DTPs have been made to improve glycemic control:  Ozempic discontinued  Have there been any recent hospitalizations or ED visits since last visit with PharmD? No recent hospital visits.   Patient {reports/denies:24182} hypoglycemic symptoms, including {Hypoglycemic Symptoms:3049003}  Patient {reports/denies:24182} hyperglycemic symptoms, including {symptoms; hyperglycemia:17903}  How often are you checking your blood sugar? {BG Testing frequency:23922}  What are your blood sugars ranging?  Fasting: *** Before meals: *** After meals: *** Bedtime: ***  During the week, how often does your blood glucose drop below 70? {LowBGfrequency:24142}  Are you checking your feet daily/regularly? {yes/no:20286}  Adherence Review: Is the patient currently on a STATIN medication? Yes Is the patient currently on ACE/ARB medication? No Does the patient have >5 day gap between last estimated fill dates? {yes/no:20286}  Chart Updates:  Recent office visits:  10/15/2022 Sydney Peng NP - Patient was seen for Acute recurrent pansinusitis and an additional concern. Started Keflex 500 mg tid.  08/03/2022 Sydney Peng NP - Patient was seen for  Type 2 diabetes mellitus with complication, without long-term current use of insulin  and additional concerns. Started Augmentin. Discontinued HCTZ and Ozempic.   Recent consult visits:  None  Hospital visits:   None  Medications: Outpatient Encounter Medications as of 11/05/2022  Medication Sig   amLODipine (NORVASC) 5 MG tablet Take 1 tablet (5 mg total) by mouth daily.   busPIRone (BUSPAR) 15 MG tablet TAKE 1 TABLET BY MOUTH THREE TIMES DAILY   Cholecalciferol (VITAMIN D3) 2000 units TABS Take 1 tablet by mouth daily.   cilostazol (PLETAL) 100 MG tablet Take 0.5 tablets (50 mg total) by mouth 2 (two) times daily.   Continuous Blood Gluc Receiver (DEXCOM G7 RECEIVER) DEVI Use with Dexcom G7 sensory   Continuous Blood Gluc Sensor (DEXCOM G7 SENSOR) MISC Use one sensor every 10 days   cyclobenzaprine (FLEXERIL) 10 MG tablet Take 1 tablet (10 mg total) by mouth at bedtime.   FLUoxetine (PROZAC) 40 MG capsule Take 1 capsule (40 mg total) by mouth daily.   isosorbide mononitrate (IMDUR) 60 MG 24 hr tablet Take 1.5 tablets (90 mg total) by mouth daily.   LANTUS SOLOSTAR 100 UNIT/ML Solostar Pen INJECT 16 UNITS SUBCUTANEOUSLY TWICE DAILY   levETIRAcetam (KEPPRA) 250 MG tablet TAKE 1 TABLET BY MOUTH THREE TIMES DAILY   metoCLOPramide (REGLAN) 5 MG tablet Take 1 tablet (5 mg total) by mouth every 8 (eight) hours as needed for nausea.   nitroGLYCERIN (NITROSTAT) 0.4 MG SL tablet Place 1 tablet (0.4 mg total) under the tongue every 5 (five) minutes x 3 doses as needed for chest pain.   nystatin cream (MYCOSTATIN) Apply 1 application topically 2 (two) times daily.   pravastatin (PRAVACHOL) 40 MG tablet Take 1 tablet (40 mg total) by mouth at bedtime.   pregabalin (LYRICA) 100 MG capsule Take 1 capsule (100 mg total) by mouth 2 (two) times daily.   propranolol (INDERAL) 40 MG tablet Take 1 tablet (40 mg total) by mouth 2 (  two) times daily.   No facility-administered encounter medications on file as of 11/05/2022.  Fill History:  Dispensed Days Supply Quantity Provider Pharmacy  AMLODIPINE 5MG TAB 07/18/2022 90 90 each      Dispensed Days Supply Quantity Provider Pharmacy  BusPIRone 15MG      TAB 07/20/2022  90 270 each      Dispensed Days Supply Quantity Provider Pharmacy  CILOSTAZOL 100MG    TAB 10/21/2022 90 90 each      Dispensed Days Supply Quantity Provider Pharmacy  CYCLOBENZAPR 10MG   TAB 06/29/2021 90 90 each      Dispensed Days Supply Quantity Provider Pharmacy  FLUOXETINE 40MG CAP 08/13/2022 90 90 each      Dispensed Days Supply Quantity Provider Pharmacy  LANTUS SOLOSTAR PEN INJ 08/18/2022 94 30 mL      Dispensed Days Supply Quantity Provider Pharmacy  ISOSORB MONO ER 60MG TAB 07/16/2022 90 135 each      Dispensed Days Supply Quantity Provider Pharmacy  LevETIRAcetam 250MG TAB 10/08/2022 90 270 each      Dispensed Days Supply Quantity Provider Pharmacy  METOCLOPRAM 5MG     TAB 05/06/2022 10 30 each      Dispensed Days Supply Quantity Provider Pharmacy  NITROGLYCER 0.4MG   SUB 09/09/2020 30 25 each      Dispensed Days Supply Quantity Provider Pharmacy  NYSTATIN 100000 U    CRE 10/16/2021 10 30 g      Dispensed Days Supply Quantity Provider Pharmacy  PRAVASTATIN 40MG    TAB 05/19/2022 90 90 each      Dispensed Days Supply Quantity Provider Pharmacy  PREGABALIN 100MG CAP 10/08/2022 90 180 each      Dispensed Days Supply Quantity Provider Pharmacy  PROPRANOLOL 40MG    TAB 08/13/2022 90 180 each      Recent Relevant Labs: Lab Results  Component Value Date/Time   HGBA1C 7.0 (A) 08/03/2022 11:09 AM   HGBA1C 8.1 (H) 05/06/2022 02:38 PM   HGBA1C 7.3 (H) 02/24/2022 03:08 PM   MICROALBUR 1.5 02/24/2022 03:08 PM   MICROALBUR 1.4 04/05/2013 02:05 PM    Kidney Function Lab Results  Component Value Date/Time   CREATININE 1.33 (H) 06/03/2022 03:48 PM   CREATININE 1.48 (H) 05/06/2022 02:38 PM   CREATININE 0.9 03/12/2015 11:15 AM   GFR 42.35 (L) 06/03/2022 03:48 PM   GFRNONAA 54 (L) 08/21/2018 10:07 PM   GFRAA >60 08/21/2018 10:07 PM    Star Rating Drugs: Pravastatin 40 mg - last filled 05/19/2022 90 [facility] verified with pharm tech  Care Gaps:  AWV -  completed 02/23/2022, scheduled  Last eye exam / retinopathy screening: 12/31/2020 Last diabetic foot exam: 02/24/2022 Covid - never done Tdap - never done Pap smear - never done Shingrix - never done Lung Ca Screen - overdue Flu - postponed Mammogram - postponed Colonoscopy - postponed   ***sig

## 2022-11-12 ENCOUNTER — Other Ambulatory Visit: Payer: Self-pay | Admitting: Adult Health

## 2022-11-12 DIAGNOSIS — I2119 ST elevation (STEMI) myocardial infarction involving other coronary artery of inferior wall: Secondary | ICD-10-CM

## 2022-12-09 ENCOUNTER — Telehealth (INDEPENDENT_AMBULATORY_CARE_PROVIDER_SITE_OTHER): Payer: Medicare Other | Admitting: Adult Health

## 2022-12-09 ENCOUNTER — Encounter: Payer: Self-pay | Admitting: Adult Health

## 2022-12-09 DIAGNOSIS — M5412 Radiculopathy, cervical region: Secondary | ICD-10-CM

## 2022-12-09 DIAGNOSIS — E118 Type 2 diabetes mellitus with unspecified complications: Secondary | ICD-10-CM

## 2022-12-09 MED ORDER — GLIPIZIDE ER 10 MG PO TB24: 10.0000 mg | ORAL_TABLET | Freq: Every day | ORAL | 0 refills | Status: DC

## 2022-12-09 MED ORDER — PREGABALIN 150 MG PO CAPS: 150.0000 mg | ORAL_CAPSULE | Freq: Two times a day (BID) | ORAL | 0 refills | Status: DC

## 2022-12-09 NOTE — Progress Notes (Signed)
Virtual Visit via Video Note  I connected with [name] on 12/09/22 at  3:30 PM EDT by a video enabled telemedicine application and verified that I am speaking with the correct person using two identifiers.  Location patient: home Location provider:work or home office Persons participating in the virtual visit: patient, provider  I discussed the limitations of evaluation and management by telemedicine and the availability of in person appointments. The patient expressed understanding and agreed to proceed.   HPI: 67 year old female who  has a past medical history of Acute MI, inferior wall, initial episode of care ([place]) (07/08/2012), Anxiety, Asthma, Coronary artery disease, Depression, Diabetes mellitus, Fibromyalgia, Leukocytosis, Monocytosis, Peripheral arterial disease ([place]) (01/23/2013), and Tobacco use disorder (07/08/2012).  She is being evaluated today for multiple issues.   DM Type 2 - about a month ago she had stop Victoza due to GI issues. She reports that since that time her blood sugars have been " all over the place". She is taking Lantus 13 units BID. She is having spikes in the early morning between 12-4 am and then will have hypoglycemia into the 40's. She is not eating much due to Victoza side effects . She is taking Lantus 18 units daily   Further more she is having worsening cervical radiculopathy with numbness and tingling down her right arm. This has been present for three weeks or so. She has been using her Lyrica 100 mg BID and  OTC medication without improvement   ROS: See pertinent positives and negatives per HPI.  Past Medical History:  Diagnosis Date   Acute MI, inferior wall, initial episode of care ([place]) 07/08/2012   Cath-07/08/11 -distal RCA stent DES (99%), LAD 20-30%. EF 50% inferior hypokinesis  (infarct)   Anxiety    Asthma    Coronary artery disease    Depression    Diabetes mellitus    Fibromyalgia    Leukocytosis    Monocytosis     Peripheral arterial disease ([place]) 01/23/2013   Tobacco use disorder 07/08/2012    Past Surgical History:  Procedure Laterality Date   ABDOMINAL AORTAGRAM N/A 02/07/2013   Procedure: ABDOMINAL Maxcine Ham;  Surgeon: Wellington Hampshire, MD;  Location: [place] [facility] CATH LAB;  Service: Cardiovascular;  Laterality: N/A;   CARDIAC CATHETERIZATION     CORONARY STENT PLACEMENT     CYST REMOVAL TRUNK  1977   tailbone   LEFT HEART CATHETERIZATION WITH CORONARY ANGIOGRAM N/A 07/07/2012   Procedure: LEFT HEART CATHETERIZATION WITH CORONARY ANGIOGRAM;  Surgeon: Burnell Blanks, MD;  Location: [facility] CATH LAB;  Service: Cardiovascular;  Laterality: N/A;   PERCUTANEOUS CORONARY STENT INTERVENTION (PCI-S)  07/07/2012   Procedure: PERCUTANEOUS CORONARY STENT INTERVENTION (PCI-S);  Surgeon: Burnell Blanks, MD;  Location: [facility] CATH LAB;  Service: Cardiovascular;;   TONSILLECTOMY      Family History  Problem Relation Age of Onset   Kidney disease Mother    Diabetes Mother        DM Type II   Alzheimer's disease Mother    Diabetes Father        IDDM   Heart disease Father    Cancer Father        bone cancer died at 72   Cancer Sister        bone cancer at age 31   Multiple sclerosis Daughter    Diabetes Son 2       DM Type I   Heart disease Maternal Grandfather    Heart disease  Paternal Grandmother    Diabetes Son 22       DM Type I       Current Outpatient Medications:    amLODipine (NORVASC) 5 MG tablet, Take 1 tablet (5 mg total) by mouth daily., Disp: 90 tablet, Rfl: 2   busPIRone (BUSPAR) 15 MG tablet, TAKE 1 TABLET BY MOUTH THREE TIMES DAILY, Disp: 270 tablet, Rfl: 0   Cholecalciferol (VITAMIN D3) 2000 units TABS, Take 1 tablet by mouth daily., Disp: , Rfl:    cilostazol (PLETAL) 100 MG tablet, Take 0.5 tablets (50 mg total) by mouth 2 (two) times daily., Disp: 90 tablet, Rfl: 3   Continuous Blood Gluc Receiver ([place]) DEVI, Use with Dexcom G7 sensory, Disp: 1 each, Rfl: 0    Continuous Blood Gluc Sensor (DEXCOM G7 SENSOR) MISC, Use one sensor every 10 days, Disp: 3 each, Rfl: 12   cyclobenzaprine (FLEXERIL) 10 MG tablet, Take 1 tablet (10 mg total) by mouth at bedtime., Disp: 90 tablet, Rfl: 0   FLUoxetine (PROZAC) 40 MG capsule, Take 1 capsule (40 mg total) by mouth daily., Disp: 90 capsule, Rfl: 0   isosorbide mononitrate (IMDUR) 60 MG 24 hr tablet, TAKE 1 & 1/2 (ONE & ONE-HALF) TABLETS BY MOUTH ONCE DAILY, Disp: 135 tablet, Rfl: 0   LANTUS SOLOSTAR 100 UNIT/ML Solostar Pen, INJECT 16 UNITS SUBCUTANEOUSLY TWICE DAILY, Disp: 30 mL, Rfl: 0   levETIRAcetam (KEPPRA) 250 MG tablet, TAKE 1 TABLET BY MOUTH THREE TIMES DAILY, Disp: 270 tablet, Rfl: 0   metoCLOPramide (REGLAN) 5 MG tablet, Take 1 tablet (5 mg total) by mouth every 8 (eight) hours as needed for nausea., Disp: 30 tablet, Rfl: 1   nitroGLYCERIN (NITROSTAT) 0.4 MG SL tablet, Place 1 tablet (0.4 mg total) under the tongue every 5 (five) minutes x 3 doses as needed for chest pain., Disp: 30 tablet, Rfl: 12   nystatin cream (MYCOSTATIN), Apply 1 application topically 2 (two) times daily., Disp: 30 g, Rfl: 2   pravastatin (PRAVACHOL) 40 MG tablet, Take 1 tablet (40 mg total) by mouth at bedtime., Disp: 90 tablet, Rfl: 1   pregabalin (LYRICA) 100 MG capsule, Take 1 capsule (100 mg total) by mouth 2 (two) times daily., Disp: 180 capsule, Rfl: 1   propranolol (INDERAL) 40 MG tablet, Take 1 tablet (40 mg total) by mouth 2 (two) times daily., Disp: 180 tablet, Rfl: 0  EXAM:  VITALS per patient if applicable:  GENERAL: alert, oriented, appears well and in no acute distress  HEENT: atraumatic, conjunttiva clear, no obvious abnormalities on inspection of external nose and ears  NECK: normal movements of the head and neck  LUNGS: on inspection no signs of respiratory distress, breathing rate appears normal, no obvious gross SOB, gasping or wheezing  CV: no obvious cyanosis  MS: moves all visible extremities without  noticeable abnormality  PSYCH/NEURO: pleasant and cooperative, no obvious depression or anxiety, speech and thought processing grossly intact  ASSESSMENT AND PLAN:  Discussed the following assessment and plan:  1. Type 2 diabetes mellitus with complication, without long-term current use of insulin (HCC) - Will decrease her insulin to 10 units BID and start her on Glipizide 10 m ER daily  - She will follow up with me in the next few weeks to let me know how her blood sugars are doing  - glipiZIDE (GLUCOTROL XL) 10 MG 24 hr tablet; Take 1 tablet (10 mg total) by mouth daily with breakfast.  Dispense: 90 tablet; Refill: 0  2. Cervical radiculopathy - Will increase Lyrica to 150 mg BID  - pregabalin (LYRICA) 150 MG capsule; Take 1 capsule (150 mg total) by mouth 2 (two) times daily.  Dispense: 60 capsule; Refill: 0   [name], NP    I discussed the assessment and treatment plan with the patient. The patient was provided an opportunity to ask questions and all were answered. The patient agreed with the plan and demonstrated an understanding of the instructions.   The patient was advised to call back or seek an in-person evaluation if the symptoms worsen or if the condition fails to improve as anticipated.   [name], NP

## 2022-12-17 ENCOUNTER — Other Ambulatory Visit: Payer: Self-pay | Admitting: Adult Health

## 2022-12-17 DIAGNOSIS — R569 Unspecified convulsions: Secondary | ICD-10-CM

## 2023-01-12 ENCOUNTER — Ambulatory Visit (INDEPENDENT_AMBULATORY_CARE_PROVIDER_SITE_OTHER): Payer: Medicare Other

## 2023-01-12 ENCOUNTER — Ambulatory Visit (INDEPENDENT_AMBULATORY_CARE_PROVIDER_SITE_OTHER): Payer: Medicare Other | Admitting: Adult Health

## 2023-01-12 ENCOUNTER — Encounter: Payer: Self-pay | Admitting: Adult Health

## 2023-01-12 VITALS — BP 100/68 | HR 82 | Temp 97.9°F | Ht 63.0 in | Wt 128.0 lb

## 2023-01-12 DIAGNOSIS — M19011 Primary osteoarthritis, right shoulder: Secondary | ICD-10-CM | POA: Diagnosis not present

## 2023-01-12 DIAGNOSIS — M25531 Pain in right wrist: Secondary | ICD-10-CM | POA: Diagnosis not present

## 2023-01-12 DIAGNOSIS — M25511 Pain in right shoulder: Secondary | ICD-10-CM

## 2023-01-12 DIAGNOSIS — M79631 Pain in right forearm: Secondary | ICD-10-CM | POA: Diagnosis not present

## 2023-01-12 MED ORDER — TRAMADOL HCL 50 MG PO TABS
50.0000 mg | ORAL_TABLET | Freq: Three times a day (TID) | ORAL | 0 refills | Status: AC | PRN
Start: 2023-01-12 — End: 2023-01-17

## 2023-01-12 NOTE — Progress Notes (Signed)
Subjective:    Patient ID: [name], female    DOB: 08-21-1956, 67 y.o.   MRN: [id number]  HPI 67 year old female who  has a past medical history of Acute MI, inferior wall, initial episode of care (07/08/2012), Anxiety, Asthma, Coronary artery disease, Depression, Diabetes mellitus, Fibromyalgia, Leukocytosis, Monocytosis, Peripheral arterial disease (01/23/2013), and Tobacco use disorder (07/08/2012).  She presents to the office today pain in her right shoulder, arm, elbow, and wrist pain. She reports that she was playing tug of war with her 21 pound scottish terrier they were playing pretty rough and the dog yanked the toy away from her causing her to hyperextend her right upper extremity.  Few days later she started to develop pain in her right shoulder, right deltoid, right forearm, and right wrist.  This was about 6 weeks ago.  At home she has been using aspirin and Aleve, using more Aleve than she would like to be using and the pain has been coming worse.    Review of Systems See HPI   Past Medical History:  Diagnosis Date   Acute MI, inferior wall, initial episode of care 07/08/2012   Cath-07/08/11 -distal RCA stent DES (99%), LAD 20-30%. EF 50% inferior hypokinesis  (infarct)   Anxiety    Asthma    Coronary artery disease    Depression    Diabetes mellitus    Fibromyalgia    Leukocytosis    Monocytosis    Peripheral arterial disease 01/23/2013   Tobacco use disorder 07/08/2012    Social History   Socioeconomic History   Marital status: Married    Spouse name: Not on file   Number of children: 3   Years of education: college   Highest education level: Not on file  Occupational History   Not on file  Tobacco Use   Smoking status: Every Day    Packs/day: 1.00    Years: 43.00    Additional pack years: 0.00    Total pack years: 43.00    Types: Cigarettes   Smokeless tobacco: Never  Substance and Sexual Activity   Alcohol use: No    Alcohol/week: 0.0 standard  drinks of alcohol    Comment: Rarely   Drug use: No   Sexual activity: Yes  Other Topics Concern   Not on file  Social History Narrative   Lives with husband, married for 20 years.      They have three grown chlildren.   She is not currently working and is on disability   Highest level of education:  Engineer, maintenance (IT)   Pets: two dogs and three rabbits.       Husband is a long Production assistant, radio.       Is unable to enjoy any activities because of body pains.       Social Determinants of Health   Financial Resource Strain: Medium Risk (06/14/2022)   Overall Financial Resource Strain (CARDIA)    Difficulty of Paying Living Expenses: Somewhat hard  Food Insecurity: No Food Insecurity (02/23/2022)   Hunger Vital Sign    Worried About Running Out of Food in the Last Year: Never true    Ran Out of Food in the Last Year: Never true  Transportation Needs: No Transportation Needs (02/23/2022)   PRAPARE - Administrator, Civil Service (Medical): No    Lack of Transportation (Non-Medical): No  Physical Activity: Inactive (02/23/2022)   Exercise Vital Sign    Days of Exercise per Week:  0 days    Minutes of Exercise per Session: 0 min  Stress: No Stress Concern Present (02/23/2022)   [employer] of Occupational Health - Occupational Stress Questionnaire    Feeling of Stress : Not at all  Social Connections: Socially Isolated (02/23/2022)   Social Connection and Isolation Panel [NHANES]    Frequency of Communication with Friends and Family: Once a week    Frequency of Social Gatherings with Friends and Family: Once a week    Attends Religious Services: Never    Database administrator or Organizations: No    Attends Banker Meetings: Never    Marital Status: Married  Catering manager Violence: Not At Risk (02/19/2021)   Humiliation, Afraid, Rape, and Kick questionnaire    Fear of Current or Ex-Partner: No    Emotionally Abused: No    Physically Abused: No     Sexually Abused: No    Past Surgical History:  Procedure Laterality Date   ABDOMINAL AORTAGRAM N/A 02/07/2013   Procedure: ABDOMINAL Ronny Flurry;  Surgeon: Iran Ouch, MD;  Location: [facility] CATH LAB;  Service: Cardiovascular;  Laterality: N/A;   CARDIAC CATHETERIZATION     CORONARY STENT PLACEMENT     CYST REMOVAL TRUNK  1977   tailbone   LEFT HEART CATHETERIZATION WITH CORONARY ANGIOGRAM N/A 07/07/2012   Procedure: LEFT HEART CATHETERIZATION WITH CORONARY ANGIOGRAM;  Surgeon: Kathleene Hazel, MD;  Location: [facility] CATH LAB;  Service: Cardiovascular;  Laterality: N/A;   PERCUTANEOUS CORONARY STENT INTERVENTION (PCI-S)  07/07/2012   Procedure: PERCUTANEOUS CORONARY STENT INTERVENTION (PCI-S);  Surgeon: Kathleene Hazel, MD;  Location: [facility] CATH LAB;  Service: Cardiovascular;;   TONSILLECTOMY      Family History  Problem Relation Age of Onset   Kidney disease Mother    Diabetes Mother        DM Type II   Alzheimer's disease Mother    Diabetes Father        IDDM   Heart disease Father    Cancer Father        bone cancer died at 62   Cancer Sister        bone cancer at age 51   Multiple sclerosis Daughter    Diabetes Son 2       DM Type I   Heart disease Maternal Grandfather    Heart disease Paternal Grandmother    Diabetes Son 81       DM Type I    Allergies  Allergen Reactions   Erythromycin Other (See Comments)   Statins     SEVERE MUSCLE PAIN - failed lipitor, Livalo, red yeast rice (lovastatin), and she thinks Zocor and pravastatin   Ace Inhibitors Cough    Patient refuses to take meds   Cymbalta [Duloxetine Hcl]     Drowsiness   Doxycycline Other (See Comments)    diarrhea   Jardiance [Empagliflozin]     Yeast infections      Current Outpatient Medications on File Prior to Visit  Medication Sig Dispense Refill   amLODipine (NORVASC) 5 MG tablet Take 1 tablet (5 mg total) by mouth daily. 90 tablet 2   busPIRone (BUSPAR) 15 MG tablet TAKE 1 TABLET BY  MOUTH THREE TIMES DAILY 270 tablet 0   Cholecalciferol (VITAMIN D3) 2000 units TABS Take 1 tablet by mouth daily.     cilostazol (PLETAL) 100 MG tablet Take 0.5 tablets (50 mg total) by mouth 2 (two) times daily. 90 tablet 3  Continuous Blood Gluc Receiver (DEXCOM G7 RECEIVER) DEVI Use with Dexcom G7 sensory 1 each 0   Continuous Blood Gluc Sensor (DEXCOM G7 SENSOR) MISC Use one sensor every 10 days 3 each 12   FLUoxetine (PROZAC) 40 MG capsule Take 1 capsule (40 mg total) by mouth daily. 90 capsule 0   glipiZIDE (GLUCOTROL XL) 10 MG 24 hr tablet Take 1 tablet (10 mg total) by mouth daily with breakfast. 90 tablet 0   isosorbide mononitrate (IMDUR) 60 MG 24 hr tablet TAKE 1 & 1/2 (ONE & ONE-HALF) TABLETS BY MOUTH ONCE DAILY 135 tablet 0   LANTUS SOLOSTAR 100 UNIT/ML Solostar Pen INJECT 16 UNITS SUBCUTANEOUSLY TWICE DAILY (Patient taking differently: Inject 10 Units into the skin 2 (two) times daily.) 30 mL 0   levETIRAcetam (KEPPRA) 250 MG tablet TAKE 1 TABLET BY MOUTH THREE TIMES DAILY 270 tablet 0   nitroGLYCERIN (NITROSTAT) 0.4 MG SL tablet Place 1 tablet (0.4 mg total) under the tongue every 5 (five) minutes x 3 doses as needed for chest pain. 30 tablet 12   nystatin cream (MYCOSTATIN) Apply 1 application topically 2 (two) times daily. 30 g 2   pravastatin (PRAVACHOL) 40 MG tablet Take 1 tablet (40 mg total) by mouth at bedtime. 90 tablet 1   pregabalin (LYRICA) 150 MG capsule Take 1 capsule (150 mg total) by mouth 2 (two) times daily. 60 capsule 0   propranolol (INDERAL) 40 MG tablet Take 1 tablet (40 mg total) by mouth 2 (two) times daily. 180 tablet 0   No current facility-administered medications on file prior to visit.    BP 100/68   Pulse 82   Temp 97.9 F (36.6 C) (Oral)   Ht 5\' 3"  (1.6 m)   Wt 128 lb (58.1 kg)   SpO2 96%   BMI 22.67 kg/m        Objective:   Physical Exam Vitals and nursing note reviewed.  Constitutional:      Appearance: Normal appearance.   Musculoskeletal:     Right shoulder: Tenderness, bony tenderness and crepitus present. No swelling or deformity. Decreased range of motion. Decreased strength.     Right upper arm: Tenderness (throughout deltoid muslce) present.     Right elbow: Normal.     Right forearm: No swelling or deformity.     Right wrist: Bony tenderness present. No tenderness, snuff box tenderness or crepitus. Normal range of motion. Normal pulse.     Right hand: No tenderness or bony tenderness. Normal range of motion. Normal strength. Normal sensation. There is no disruption of two-point discrimination. Normal capillary refill.     Comments: Unable to raise arm above head. She is able to perform back scratch test but has pain with doing so.   Neurological:     General: No focal deficit present.     Mental Status: She is alert and oriented to person, place, and time.  Psychiatric:        Mood and Affect: Mood normal.        Behavior: Behavior normal.        Thought Content: Thought content normal.        Judgment: Judgment normal.       Assessment & Plan:  1. Acute pain of right shoulder -We will x-ray her right shoulder and right wrist today as this seems to be areas of most tenderness.  There is concern for rotator cuff tear.  She also might have some tendinitis or muscle strain throughout her  right arm.  I would imagine we will need an MRI for the right shoulder.  Consider referral to orthopedics.  Will prescribe short course of tramadol to use as needed.  She was encouraged to ice is much as possible. - DG Shoulder Right; Future - traMADol (ULTRAM) 50 MG tablet; Take 1 tablet (50 mg total) by mouth every 8 (eight) hours as needed for up to 5 days.  Dispense: 15 tablet; Refill: 0  2. Right wrist pain  - DG Wrist Complete Right; Future - traMADol (ULTRAM) 50 MG tablet; Take 1 tablet (50 mg total) by mouth every 8 (eight) hours as needed for up to 5 days.  Dispense: 15 tablet; Refill: 0  3. Right forearm  pain  - traMADol (ULTRAM) 50 MG tablet; Take 1 tablet (50 mg total) by mouth every 8 (eight) hours as needed for up to 5 days.  Dispense: 15 tablet; Refill: 0  Shirline Frees, NP

## 2023-01-13 ENCOUNTER — Telehealth: Payer: Self-pay

## 2023-01-13 NOTE — Progress Notes (Signed)
Care Management & Coordination Services Pharmacy Team  Reason for Encounter: Diabetes  Contacted patient to discuss diabetes disease state. Unsuccessful outreach. Left voicemail for patient to return call. Multiple attempts  Current antihyperglycemic regimen:  Amlodipine 5 mg daily Imdur 60 mg 1.5 daily Propranolol 40 mg twice daily  Patient verbally confirms she is taking the above medications as directed.   What diet changes have been made to improve diabetes control?  What recent interventions/DTPs have been made to improve glycemic control:    Have there been any recent hospitalizations or ED visits since last visit with PharmD?   Patient  hypoglycemic symptoms, including   Patient  hyperglycemic symptoms, including   How often are you checking your blood sugar?   What are your blood sugars ranging?  Fasting:  After meals:   During the week, how often does your blood glucose drop below 70?  Are you checking your feet daily/regularly?   Adherence Review: Is the patient currently on a STATIN medication? Yes Is the patient currently on ACE/ARB medication? No Does the patient have >5 day gap between last estimated fill dates? Yes  Care Gaps: AWV - completed 02/23/2022 Last eye exam - 12/31/2020 Last foot exam - 02/24/2022 Last BP - 100/68 on 01/12/2023 Last A1C - 7.0 on 08/03/2022 Covid - never done Tdap - never done Pap smear - never done Shingrix - never done Mammogram - postponed Colonoscopy - postponed  Star Rating Drugs: Glipizide 10 mg - last filled 12/09/2022 90 DS at [facility] Pravastatin 40 mg - last filled 05/19/2022 90 DS at [facility] verified with pharm tech  Chart Updates: Recent office visits:  01/12/2023 Shirline Frees NP - Patient was seen for Acute pain of right shoulder and an additional concern. Started Tramadol 50 mg prn.  12/09/2022 Shirline Frees NP - Type 2 diabetes mellitus with complication, without long-term current use of insulin and an  additional concern. Started Glipizide. Increased Pregabalin  twice daily. Discontinued Cyclobenzaprine and Metoclopramide.   Recent consult visits:  None  Hospital visits:  None  Medications: Outpatient Encounter Medications as of 01/13/2023  Medication Sig   amLODipine (NORVASC) 5 MG tablet Take 1 tablet (5 mg total) by mouth daily.   busPIRone (BUSPAR) 15 MG tablet TAKE 1 TABLET BY MOUTH THREE TIMES DAILY   Cholecalciferol (VITAMIN D3) 2000 units TABS Take 1 tablet by mouth daily.   cilostazol (PLETAL) 100 MG tablet Take 0.5 tablets (50 mg total) by mouth 2 (two) times daily.   Continuous Blood Gluc Receiver (DEXCOM G7 RECEIVER) DEVI Use with Dexcom G7 sensory   Continuous Blood Gluc Sensor (DEXCOM G7 SENSOR) MISC Use one sensor every 10 days   FLUoxetine (PROZAC) 40 MG capsule Take 1 capsule (40 mg total) by mouth daily.   glipiZIDE (GLUCOTROL XL) 10 MG 24 hr tablet Take 1 tablet (10 mg total) by mouth daily with breakfast.   isosorbide mononitrate (IMDUR) 60 MG 24 hr tablet TAKE 1 & 1/2 (ONE & ONE-HALF) TABLETS BY MOUTH ONCE DAILY   LANTUS SOLOSTAR 100 UNIT/ML Solostar Pen INJECT 16 UNITS SUBCUTANEOUSLY TWICE DAILY (Patient taking differently: Inject 10 Units into the skin 2 (two) times daily.)   levETIRAcetam (KEPPRA) 250 MG tablet TAKE 1 TABLET BY MOUTH THREE TIMES DAILY   nitroGLYCERIN (NITROSTAT) 0.4 MG SL tablet Place 1 tablet (0.4 mg total) under the tongue every 5 (five) minutes x 3 doses as needed for chest pain.   nystatin cream (MYCOSTATIN) Apply 1 application topically 2 (two) times  daily.   pravastatin (PRAVACHOL) 40 MG tablet Take 1 tablet (40 mg total) by mouth at bedtime.   pregabalin (LYRICA) 150 MG capsule Take 1 capsule (150 mg total) by mouth 2 (two) times daily.   propranolol (INDERAL) 40 MG tablet Take 1 tablet (40 mg total) by mouth 2 (two) times daily.   traMADol (ULTRAM) 50 MG tablet Take 1 tablet (50 mg total) by mouth every 8 (eight) hours as needed for up  to 5 days.   No facility-administered encounter medications on file as of 01/13/2023.  Fill History:  Dispensed Days Supply Quantity Provider Pharmacy  AMLODIPINE  TAB 11/12/2022 90 90 each      Dispensed Days Supply Quantity Provider Pharmacy  BusPIRone       TAB 11/16/2022 90 270 each      Dispensed Days Supply Quantity Provider Pharmacy  CILOSTAZOL     TAB 10/21/2022 90 90 each      Dispensed Days Supply Quantity Provider Pharmacy  FLUoxetine      CAP 11/12/2022 90 90 each      Dispensed Days Supply Quantity Provider Pharmacy  GLIPIZIDE ER    TAB 12/09/2022 90 90 each      Dispensed Days Supply Quantity Provider Pharmacy  LANTUS SOLOSTAR PEN INJ 08/18/2022 94 30 mL      Dispensed Days Supply Quantity Provider Pharmacy  LevETIRAcetam  TAB 12/23/2022 90 270 each      Dispensed Days Supply Quantity Provider Pharmacy  PRAVASTATIN     TAB 05/19/2022 90 90 each      Dispensed Days Supply Quantity Provider Pharmacy  PREGABALIN  CAP 12/09/2022 30 60 each      Dispensed Days Supply Quantity Provider Pharmacy  PROPRANOLOL     TAB 11/13/2022 90 180 each     Recent Relevant Labs: Lab Results  Component Value Date/Time   HGBA1C 7.0 (A) 08/03/2022 11:09 AM   HGBA1C 8.1 (H) 05/06/2022 02:38 PM   HGBA1C 7.3 (H) 02/24/2022 03:08 PM   MICROALBUR 1.5 02/24/2022 03:08 PM   MICROALBUR 1.4 04/05/2013 02:05 PM    Kidney Function Lab Results  Component Value Date/Time   CREATININE 1.33 (H) 06/03/2022 03:48 PM   CREATININE 1.48 (H) 05/06/2022 02:38 PM   CREATININE 0.9 03/12/2015 11:15 AM   GFR 42.35 (L) 06/03/2022 03:48 PM   GFRNONAA 54 (L) 08/21/2018 10:07 PM   GFRAA >60 08/21/2018 10:07 PM   [name] CMA  Clinical Pharmacist Assistant [phone]

## 2023-01-20 ENCOUNTER — Other Ambulatory Visit: Payer: Self-pay | Admitting: Adult Health

## 2023-01-20 ENCOUNTER — Telehealth: Payer: Self-pay | Admitting: Adult Health

## 2023-01-20 DIAGNOSIS — M25511 Pain in right shoulder: Secondary | ICD-10-CM

## 2023-01-20 NOTE — Telephone Encounter (Signed)
Patient calling states she received a call this morning from this office. No note to reflect that. Requests a call

## 2023-01-20 NOTE — Telephone Encounter (Signed)
Left message to return phone call.

## 2023-01-21 NOTE — Telephone Encounter (Signed)
Patient notified of update  and verbalized understanding. 

## 2023-01-24 ENCOUNTER — Emergency Department (HOSPITAL_COMMUNITY)
Admission: EM | Admit: 2023-01-24 | Discharge: 2023-01-24 | Disposition: A | Discharge status: Home / Self Care | Payer: Medicare Other | Attending: Emergency Medicine | Admitting: Emergency Medicine

## 2023-01-24 ENCOUNTER — Encounter (HOSPITAL_COMMUNITY): Payer: Self-pay

## 2023-01-24 ENCOUNTER — Other Ambulatory Visit: Payer: Self-pay

## 2023-01-24 DIAGNOSIS — I2510 Atherosclerotic heart disease of native coronary artery without angina pectoris: Secondary | ICD-10-CM | POA: Insufficient documentation

## 2023-01-24 DIAGNOSIS — E119 Type 2 diabetes mellitus without complications: Secondary | ICD-10-CM | POA: Diagnosis not present

## 2023-01-24 DIAGNOSIS — J45909 Unspecified asthma, uncomplicated: Secondary | ICD-10-CM | POA: Diagnosis not present

## 2023-01-24 DIAGNOSIS — M79601 Pain in right arm: Secondary | ICD-10-CM | POA: Diagnosis not present

## 2023-01-24 DIAGNOSIS — Z7984 Long term (current) use of oral hypoglycemic drugs: Secondary | ICD-10-CM | POA: Diagnosis not present

## 2023-01-24 DIAGNOSIS — I739 Peripheral vascular disease, unspecified: Secondary | ICD-10-CM | POA: Diagnosis not present

## 2023-01-24 DIAGNOSIS — Z79899 Other long term (current) drug therapy: Secondary | ICD-10-CM | POA: Diagnosis not present

## 2023-01-24 MED ORDER — KETOROLAC TROMETHAMINE 30 MG/ML IJ SOLN
30.0000 mg | Freq: Once | INTRAMUSCULAR | Status: AC
  Administered 2023-01-24: 30 mg via INTRAMUSCULAR
  Filled 2023-01-24: qty 1

## 2023-01-24 MED ORDER — PREDNISONE 20 MG PO TABS: 40.0000 mg | ORAL_TABLET | Freq: Every day | ORAL | 0 refills | Status: AC

## 2023-01-24 MED ORDER — CYCLOBENZAPRINE HCL 10 MG PO TABS: 10.0000 mg | ORAL_TABLET | Freq: Two times a day (BID) | ORAL | 0 refills | Status: DC | PRN

## 2023-01-24 NOTE — ED Triage Notes (Signed)
Pt arrived POV from home c/o right arm pain from her shoulder all the way down to her finger tips. Pt states she had x-rays recently and they could not find anything wrong but she cannot take the pain anymore.

## 2023-01-24 NOTE — Discharge Instructions (Signed)
Note the visit emergency department today was overall reassuring.  As discussed we will send in prednisone as well as muscle relaxer to treat your pain.  Recommend follow-up with orthopedics as well as scheduling of your MRI of the right shoulder.  Continue doing range of motion exercises despite being in the sling so as to avoid developing frozen shoulder.  Please do not hesitate to return to emergency department for worrisome signs and symptoms we discussed become apparent.

## 2023-01-24 NOTE — ED Provider Notes (Signed)
[place] EMERGENCY DEPARTMENT AT [facility] Provider Note   CSN: [id number] Arrival date & time: 01/24/23  4098     History  Chief Complaint  Patient presents with   Arm Pain    [name] is a 67 y.o. female.   Arm Pain   67 year old female presents emergency department complaints of right arm pain.  Patient reports history of symptoms over the past 4 to 6 weeks.  Has seen primary care for said symptoms with x-ray imaging of right shoulder performed at that time.  Patient states that symptoms initially began when she was playing "tug-of-war" with one of her dogs.  States she felt abrupt onset pain at that time.  Was seen on the 17th with x-ray imaging of left shoulder significant for degenerative changes of AC joint otherwise unremarkable for acute process.  Patient has been taking nonsteroidal medication, Tylenol and tramadol prescribed by primary care which has helped some.  Presents emergency department due to continued pain.  Has upcoming MRI of right shoulder for evaluation of rotator cuff pathology.  Denies any repeat trauma, weakness, sensory deficits in affected arm.  States that pain begins in right shoulder and radiates down to her fingertips.  Denies fever, chills, chest pain, shortness of breath  Past medical history significant for MI, peripheral arterial disease, coronary artery disease, diabetes mellitus, fibromyalgia, anxiety, asthma  Home Medications Prior to Admission medications   Medication Sig Start Date End Date Taking? Authorizing Provider  cyclobenzaprine (FLEXERIL) 10 MG tablet Take 1 tablet (10 mg total) by mouth 2 (two) times daily as needed for muscle spasms. 01/24/23  Yes Sherian Maroon A, PA  predniSONE (DELTASONE) 20 MG tablet Take 2 tablets (40 mg total) by mouth daily with breakfast for 6 days. 01/24/23 01/30/23 Yes Sherian Maroon A, PA  amLODipine (NORVASC) 5 MG tablet Take 1 tablet (5 mg total) by mouth daily. 05/11/22   [name], Kandee Keen,  NP  busPIRone (BUSPAR) 15 MG tablet TAKE 1 TABLET BY MOUTH THREE TIMES DAILY 11/16/22   Shirline Frees, NP  Cholecalciferol (VITAMIN D3) 2000 units TABS Take 1 tablet by mouth daily.    [provider]  cilostazol (PLETAL) 100 MG tablet Take 0.5 tablets (50 mg total) by mouth 2 (two) times daily. 09/23/22   Shirline Frees, NP  Continuous Blood Gluc Receiver (DEXCOM G7 RECEIVER) DEVI Use with Dexcom G7 sensory 08/18/22   [name], Kandee Keen, NP  Continuous Blood Gluc Sensor (DEXCOM G7 SENSOR) MISC Use one sensor every 10 days 01/26/22   [name], Kandee Keen, NP  FLUoxetine (PROZAC) 40 MG capsule Take 1 capsule (40 mg total) by mouth daily. 11/02/22   [name], Kandee Keen, NP  glipiZIDE (GLUCOTROL XL) 10 MG 24 hr tablet Take 1 tablet (10 mg total) by mouth daily with breakfast. 12/09/22   [name], Kandee Keen, NP  isosorbide mononitrate (IMDUR) 60 MG 24 hr tablet TAKE 1 & 1/2 (ONE & ONE-HALF) TABLETS BY MOUTH ONCE DAILY 11/16/22   [name], Kandee Keen, NP  LANTUS SOLOSTAR 100 UNIT/ML Solostar Pen INJECT 16 UNITS SUBCUTANEOUSLY TWICE DAILY Patient taking differently: Inject 10 Units into the skin 2 (two) times daily. 08/18/22   [name], Kandee Keen, NP  levETIRAcetam (KEPPRA) 250 MG tablet TAKE 1 TABLET BY MOUTH THREE TIMES DAILY 12/17/22   [name], Kandee Keen, NP  nitroGLYCERIN (NITROSTAT) 0.4 MG SL tablet Place 1 tablet (0.4 mg total) under the tongue every 5 (five) minutes x 3 doses as needed for chest pain. 08/14/20   Shirline Frees, NP  nystatin cream (  MYCOSTATIN) Apply 1 application topically 2 (two) times daily. 10/30/20   [name], Kandee Keen, NP  pravastatin (PRAVACHOL) 40 MG tablet Take 1 tablet (40 mg total) by mouth at bedtime. 02/15/22   [name], Kandee Keen, NP  pregabalin (LYRICA) 150 MG capsule Take 1 capsule (150 mg total) by mouth 2 (two) times daily. 12/09/22   [name], Kandee Keen, NP  propranolol (INDERAL) 40 MG tablet Take 1 tablet (40 mg total) by mouth 2 (two) times daily. 11/02/22   [name], Kandee Keen, NP      Allergies    Erythromycin,  Statins, Ace inhibitors, Cymbalta [duloxetine hcl], Doxycycline, and Jardiance [empagliflozin]    Review of Systems   Review of Systems  All other systems reviewed and are negative.   Physical Exam Updated Vital Signs BP (!) 136/103   Pulse 72   Temp 98.2 F (36.8 C)   Resp 20   Ht 5' 3.5" (1.613 m)   Wt 58.1 kg   SpO2 97%   BMI 22.32 kg/m  Physical Exam Vitals and nursing note reviewed.  Constitutional:      General: She is not in acute distress.    Appearance: She is well-developed.  HENT:     Head: Normocephalic and atraumatic.  Eyes:     Conjunctiva/sclera: Conjunctivae normal.  Cardiovascular:     Rate and Rhythm: Normal rate and regular rhythm.     Heart sounds: No murmur heard. Pulmonary:     Effort: Pulmonary effort is normal. No respiratory distress.     Breath sounds: Normal breath sounds.  Abdominal:     Palpations: Abdomen is soft.     Tenderness: There is no abdominal tenderness.  Musculoskeletal:        General: No swelling.     Cervical back: Neck supple.     Comments: No midline tenderness of cervical, thoracic, lumbar spine with no obvious step-off or deformity noted.  Paraspinal tenderness noted the right upper thoracic spine region with overlying tenseness appreciated when compared contralaterally.  Patient with limited range of motion of right shoulder secondary to pain during active range of motion but with full passive range of motion.  Patient unable to perform any active special test of shoulder secondary to pain.  Radial pulses 2+ bilaterally.  No sensory deficits along major nerve distributions of lower extremities.  Patient with full range of motion of elbow, wrist, digits bilaterally.  No overlying erythema, palpable fluctuance or induration.  Skin:    General: Skin is warm and dry.     Capillary Refill: Capillary refill takes less than 2 seconds.  Neurological:     Mental Status: She is alert.  Psychiatric:        Mood and Affect: Mood  normal.     ED Results / Procedures / Treatments   Labs (all labs ordered are listed, but only abnormal results are displayed) Labs Reviewed - No data to display  EKG None  Radiology No results found.  Procedures Procedures    Medications Ordered in ED Medications  ketorolac (TORADOL) 30 MG/ML injection 30 mg (30 mg Intramuscular Given 01/24/23 1158)    ED Course/ Medical Decision Making/ A&P                             Medical Decision Making Risk Prescription drug management.   This patient presents to the ED for concern of right arm pain, this involves an extensive number of treatment options, and is a complaint  that carries with it a high risk of complications and morbidity.  The differential diagnosis includes ischemic limb, DVT, strain/sprain, cervical/thoracic radiculopathy, fracture, dislocation, cellulitis, erysipelas   Co morbidities that complicate the patient evaluation  See HPI   Additional history obtained:  Additional history obtained from EMR External records from outside source obtained and reviewed including hospital records   Lab Tests:  N/a   Imaging Studies ordered:  N/a   Cardiac Monitoring: / EKG:  The patient was maintained on a cardiac monitor.  I personally viewed and interpreted the cardiac monitored which showed an underlying rhythm of: Sinus rhythm   Consultations Obtained:  N/a   Problem List / ED Course / Critical interventions / Medication management  Right arm pain I ordered medication including Toradol   Reevaluation of the patient after these medicines showed that the patient improved I have reviewed the patients home medicines and have made adjustments as needed   Social Determinants of Health:  Chronic cigarette use.  Denies illicit drug use.   Test / Admission - Considered:  Right arm pain Vitals signs within normal range and stable throughout visit. 67 year old female presents emergency  department with continued right-sided arm pain.  Symptoms seem consistent with cervical/upper thoracic radiculopathy with possible rotator cuff pathology given limited range of motion of right shoulder secondary to pain.  No evidence of acute ischemic limb, upper extremity thromboembolism.  No evidence of infectious process such as cellulitis, erysipelas, abscess, necrotizing fasciitis.  Patient without additional traumatic mechanism so low suspicion for acute fracture or dislocation.  Given radicular nation of symptoms as well as overlying muscular tenderness/spasm, will send home with prednisone as well as muscle laxer to use as needed.  Patient recommended follow-up with orthopedics and continuation with her MRI of her right shoulder for underlying potential rotator cuff pathology.  Patient given sling temporarily to help aid in relief of shoulder pain recommended continue range of motion exercises so as to avoid potential development of frozen shoulder/adhesive capsulitis.  Treatment plan discussed at length with patient and she acknowledged understanding was agreeable to said plan. Worrisome signs and symptoms were discussed with the patient, and the patient acknowledged understanding to return to the ED if noticed. Patient was stable upon discharge.          Final Clinical Impression(s) / ED Diagnoses Final diagnoses:  Right arm pain    Rx / DC Orders ED Discharge Orders          Ordered    cyclobenzaprine (FLEXERIL) 10 MG tablet  2 times daily PRN        01/24/23 1142    predniSONE (DELTASONE) 20 MG tablet  Daily with breakfast        01/24/23 1142              Peter Garter, [place] 01/24/23 1302    Loetta Rough, MD 01/24/23 1717

## 2023-01-26 ENCOUNTER — Encounter: Payer: Self-pay | Admitting: Adult Health

## 2023-01-27 ENCOUNTER — Encounter: Payer: Self-pay | Admitting: Adult Health

## 2023-01-27 ENCOUNTER — Other Ambulatory Visit: Payer: Self-pay | Admitting: Adult Health

## 2023-01-27 DIAGNOSIS — M5412 Radiculopathy, cervical region: Secondary | ICD-10-CM

## 2023-01-27 DIAGNOSIS — E1165 Type 2 diabetes mellitus with hyperglycemia: Secondary | ICD-10-CM

## 2023-01-28 MED ORDER — LANTUS SOLOSTAR 100 UNIT/ML ~~LOC~~ SOPN
10.0000 [IU] | PEN_INJECTOR | Freq: Two times a day (BID) | SUBCUTANEOUS | 2 refills | Status: DC
Start: 2023-01-28 — End: 2023-12-14

## 2023-01-28 MED ORDER — PREGABALIN 150 MG PO CAPS
150.0000 mg | ORAL_CAPSULE | Freq: Two times a day (BID) | ORAL | 2 refills | Status: DC
Start: 2023-01-28 — End: 2023-05-04

## 2023-01-28 NOTE — Telephone Encounter (Signed)
  Patient comment: Predisone has BS High so i increased lantus. I was at 7 units now i am at 9.      Please advise

## 2023-01-28 NOTE — Telephone Encounter (Signed)
FYI

## 2023-02-01 ENCOUNTER — Telehealth: Payer: Self-pay

## 2023-02-01 NOTE — Telephone Encounter (Signed)
Transition Care Management Unsuccessful Follow-up Telephone Call  Date of discharge and from where:  01/24/2023 [facility]  Attempts:  1st Attempt  Reason for unsuccessful TCM follow-up call:  Left voice message  Daivik Overley Leonard  [facility] Community Resource Care Guide   [email or web address]  ?? [phone]   Website: [email or web address]  [email or web address]      

## 2023-02-02 ENCOUNTER — Encounter: Payer: Self-pay | Admitting: Adult Health

## 2023-02-02 ENCOUNTER — Ambulatory Visit (INDEPENDENT_AMBULATORY_CARE_PROVIDER_SITE_OTHER): Payer: Medicare Other | Admitting: Adult Health

## 2023-02-02 VITALS — BP 100/58 | HR 73 | Temp 97.8°F | Ht 60.35 in | Wt 131.0 lb

## 2023-02-02 DIAGNOSIS — J0141 Acute recurrent pansinusitis: Secondary | ICD-10-CM | POA: Diagnosis not present

## 2023-02-02 DIAGNOSIS — N3289 Other specified disorders of bladder: Secondary | ICD-10-CM

## 2023-02-02 LAB — POCT URINALYSIS DIPSTICK
Bilirubin, UA: NEGATIVE
Blood, UA: NEGATIVE
Glucose, UA: NEGATIVE
Ketones, UA: NEGATIVE
Leukocytes, UA: NEGATIVE
Nitrite, UA: NEGATIVE
Protein, UA: NEGATIVE
Spec Grav, UA: 1.015 (ref 1.010–1.025)
Urobilinogen, UA: 0.2 E.U./dL
pH, UA: 5.5 (ref 5.0–8.0)

## 2023-02-02 MED ORDER — CEPHALEXIN 500 MG PO CAPS
500.0000 mg | ORAL_CAPSULE | Freq: Two times a day (BID) | ORAL | 0 refills | Status: AC
Start: 2023-02-02 — End: 2023-02-09

## 2023-02-02 NOTE — Progress Notes (Signed)
Subjective:    Patient ID: [name], female    DOB: 1956/09/27, 67 y.o.   MRN: [id number]  HPI  67 year old female who  has a past medical history of Acute MI, inferior wall, initial episode of care [facility]) (07/08/2012), Anxiety, Asthma, Coronary artery disease, Depression, Diabetes mellitus, Fibromyalgia, Leukocytosis, Monocytosis, Peripheral arterial disease (HCC) (01/23/2013), and Tobacco use disorder (07/08/2012).  She presents to the office today for concern of a recurrent sinus infection. She report sinus congestion, headache, body aches, productive cough, sinus drainage and feeling acutely ill. She has not had any fevers, chills, shortness of breath past baseline, or wheezing.  Other more she is also concerned that she may have an UTI that has started.  Her only symptom is that of a "tickling in my bladder from time to time".  She denies dysuria, hematuria, frequency/urgency.   Review of Systems See HPI   Past Medical History:  Diagnosis Date   Acute MI, inferior wall, initial episode of care (HCC) 07/08/2012   Cath-07/08/11 -distal RCA stent DES (99%), LAD 20-30%. EF 50% inferior hypokinesis  (infarct)   Anxiety    Asthma    Coronary artery disease    Depression    Diabetes mellitus    Fibromyalgia    Leukocytosis    Monocytosis    Peripheral arterial disease (HCC) 01/23/2013   Tobacco use disorder 07/08/2012    Social History   Socioeconomic History   Marital status: Married    Spouse name: Not on file   Number of children: 3   Years of education: college   Highest education level: Not on file  Occupational History   Not on file  Tobacco Use   Smoking status: Every Day    Packs/day: 1.00    Years: 43.00    Additional pack years: 0.00    Total pack years: 43.00    Types: Cigarettes   Smokeless tobacco: Never  Substance and Sexual Activity   Alcohol use: No    Alcohol/week: 0.0 standard drinks of alcohol    Comment: Rarely   Drug use: No   Sexual  activity: Yes  Other Topics Concern   Not on file  Social History Narrative   Lives with husband, married for 20 years.      They have three grown chlildren.   She is not currently working and is on disability   Highest level of education:  Engineer, maintenance (IT)   Pets: two dogs and three rabbits.       Husband is a long Production assistant, radio.       Is unable to enjoy any activities because of body pains.       Social Determinants of Health   Financial Resource Strain: Medium Risk (06/14/2022)   Overall Financial Resource Strain (CARDIA)    Difficulty of Paying Living Expenses: Somewhat hard  Food Insecurity: No Food Insecurity (02/23/2022)   Hunger Vital Sign    Worried About Running Out of Food in the Last Year: Never true    Ran Out of Food in the Last Year: Never true  Transportation Needs: No Transportation Needs (02/23/2022)   PRAPARE - Administrator, Civil Service (Medical): No    Lack of Transportation (Non-Medical): No  Physical Activity: Inactive (02/23/2022)   Exercise Vital Sign    Days of Exercise per Week: 0 days    Minutes of Exercise per Session: 0 min  Stress: No Stress Concern Present (02/23/2022)   [employer]  of Occupational Health - Occupational Stress Questionnaire    Feeling of Stress : Not at all  Social Connections: Socially Isolated (02/23/2022)   Social Connection and Isolation Panel [NHANES]    Frequency of Communication with Friends and Family: Once a week    Frequency of Social Gatherings with Friends and Family: Once a week    Attends Religious Services: Never    Database administrator or Organizations: No    Attends Banker Meetings: Never    Marital Status: Married  Catering manager Violence: Not At Risk (02/19/2021)   Humiliation, Afraid, Rape, and Kick questionnaire    Fear of Current or Ex-Partner: No    Emotionally Abused: No    Physically Abused: No    Sexually Abused: No    Past Surgical History:  Procedure  Laterality Date   ABDOMINAL AORTAGRAM N/A 02/07/2013   Procedure: ABDOMINAL Ronny Flurry;  Surgeon: Iran Ouch, MD;  Location: [facility] CATH LAB;  Service: Cardiovascular;  Laterality: N/A;   CARDIAC CATHETERIZATION     CORONARY STENT PLACEMENT     CYST REMOVAL TRUNK  1977   tailbone   LEFT HEART CATHETERIZATION WITH CORONARY ANGIOGRAM N/A 07/07/2012   Procedure: LEFT HEART CATHETERIZATION WITH CORONARY ANGIOGRAM;  Surgeon: Kathleene Hazel, MD;  Location: [facility] CATH LAB;  Service: Cardiovascular;  Laterality: N/A;   PERCUTANEOUS CORONARY STENT INTERVENTION (PCI-S)  07/07/2012   Procedure: PERCUTANEOUS CORONARY STENT INTERVENTION (PCI-S);  Surgeon: Kathleene Hazel, MD;  Location: [facility] CATH LAB;  Service: Cardiovascular;;   TONSILLECTOMY      Family History  Problem Relation Age of Onset   Kidney disease Mother    Diabetes Mother        DM Type II   Alzheimer's disease Mother    Diabetes Father        IDDM   Heart disease Father    Cancer Father        bone cancer died at 48   Cancer Sister        bone cancer at age 10   Multiple sclerosis Daughter    Diabetes Son 2       DM Type I   Heart disease Maternal Grandfather    Heart disease Paternal Grandmother    Diabetes Son 66       DM Type I    Allergies  Allergen Reactions   Erythromycin Other (See Comments)   Statins     SEVERE MUSCLE PAIN - failed lipitor, Livalo, red yeast rice (lovastatin), and she thinks Zocor and pravastatin   Ace Inhibitors Cough    Patient refuses to take meds   Cymbalta [Duloxetine Hcl]     Drowsiness   Doxycycline Other (See Comments)    diarrhea   Jardiance [Empagliflozin]     Yeast infections      Current Outpatient Medications on File Prior to Visit  Medication Sig Dispense Refill   amLODipine (NORVASC) 5 MG tablet Take 1 tablet (5 mg total) by mouth daily. 90 tablet 2   busPIRone (BUSPAR) 15 MG tablet TAKE 1 TABLET BY MOUTH THREE TIMES DAILY 270 tablet 0   Cholecalciferol  (VITAMIN D3) 2000 units TABS Take 1 tablet by mouth daily.     cilostazol (PLETAL) 100 MG tablet Take 0.5 tablets (50 mg total) by mouth 2 (two) times daily. 90 tablet 3   Continuous Blood Gluc Receiver (DEXCOM G7 RECEIVER) DEVI Use with Dexcom G7 sensory 1 each 0   Continuous Blood Gluc Sensor (  DEXCOM G7 SENSOR) MISC Use one sensor every 10 days 3 each 12   cyclobenzaprine (FLEXERIL) 10 MG tablet Take 1 tablet (10 mg total) by mouth 2 (two) times daily as needed for muscle spasms. 20 tablet 0   FLUoxetine (PROZAC) 40 MG capsule Take 1 capsule (40 mg total) by mouth daily. 90 capsule 0   glipiZIDE (GLUCOTROL XL) 10 MG 24 hr tablet Take 1 tablet (10 mg total) by mouth daily with breakfast. 90 tablet 0   isosorbide mononitrate (IMDUR) 60 MG 24 hr tablet TAKE 1 & 1/2 (ONE & ONE-HALF) TABLETS BY MOUTH ONCE DAILY 135 tablet 0   LANTUS SOLOSTAR 100 UNIT/ML Solostar Pen Inject 10 Units into the skin 2 (two) times daily. 6 mL 2   levETIRAcetam (KEPPRA) 250 MG tablet TAKE 1 TABLET BY MOUTH THREE TIMES DAILY 270 tablet 0   nitroGLYCERIN (NITROSTAT) 0.4 MG SL tablet Place 1 tablet (0.4 mg total) under the tongue every 5 (five) minutes x 3 doses as needed for chest pain. 30 tablet 12   nystatin cream (MYCOSTATIN) Apply 1 application topically 2 (two) times daily. 30 g 2   pravastatin (PRAVACHOL) 40 MG tablet Take 1 tablet (40 mg total) by mouth at bedtime. 90 tablet 1   pregabalin (LYRICA) 150 MG capsule Take 1 capsule (150 mg total) by mouth 2 (two) times daily. 60 capsule 2   propranolol (INDERAL) 40 MG tablet Take 1 tablet (40 mg total) by mouth 2 (two) times daily. 180 tablet 0   No current facility-administered medications on file prior to visit.    BP (!) 100/58   Pulse 73   Temp 97.8 F (36.6 C) (Oral)   Ht 5' 0.35" (1.533 m)   Wt 131 lb (59.4 kg)   SpO2 93%   BMI 25.29 kg/m       Objective:   Physical Exam Vitals and nursing note reviewed.  Constitutional:      Appearance: Normal  appearance.  HENT:     Nose: Congestion and rhinorrhea present. Rhinorrhea is purulent.     Right Turbinates: Enlarged and swollen.     Left Turbinates: Enlarged and swollen.     Right Sinus: Maxillary sinus tenderness and frontal sinus tenderness present.     Left Sinus: Maxillary sinus tenderness and frontal sinus tenderness present.  Cardiovascular:     Rate and Rhythm: Normal rate and regular rhythm.     Pulses: Normal pulses.     Heart sounds: Normal heart sounds.  Pulmonary:     Effort: Pulmonary effort is normal.     Breath sounds: Normal breath sounds.  Abdominal:     General: Abdomen is flat. Bowel sounds are normal.     Palpations: Abdomen is soft.     Tenderness: There is left CVA tenderness. There is no right CVA tenderness.  Skin:    General: Skin is warm and dry.  Neurological:     General: No focal deficit present.     Mental Status: She is alert and oriented to person, place, and time.  Psychiatric:        Mood and Affect: Mood normal.        Behavior: Behavior normal.        Thought Content: Thought content normal.        Judgment: Judgment normal.       Assessment & Plan:  1. Bladder spasm - Negative UA.  She does not drink caffeine or carbonated drinks.  She does use sugar  substitutes that could cause bladder spasms.  Will have her cut these out and see if bladder spasms improved - POC Urinalysis Dipstick  2. Acute recurrent pansinusitis -Due to recurrent nature will order CT maxillofacial without contrast.  Quitting smoking will help. - cephALEXin (KEFLEX) 500 MG capsule; Take 1 capsule (500 mg total) by mouth 2 (two) times daily for 7 days.  Dispense: 14 capsule; Refill: 0 - CT MAXILLOFACIAL WO CONTRAST; Future  Shirline Frees, NP

## 2023-02-04 ENCOUNTER — Telehealth: Payer: Self-pay

## 2023-02-04 ENCOUNTER — Ambulatory Visit (HOSPITAL_BASED_OUTPATIENT_CLINIC_OR_DEPARTMENT_OTHER)
Admission: RE | Admit: 2023-02-04 | Discharge: 2023-02-04 | Disposition: A | Discharge status: Home / Self Care | Payer: Medicare Other | Source: Ambulatory Visit | Attending: Adult Health | Admitting: Adult Health

## 2023-02-04 DIAGNOSIS — M25511 Pain in right shoulder: Secondary | ICD-10-CM | POA: Insufficient documentation

## 2023-02-04 NOTE — Telephone Encounter (Signed)
Transition Care Management Unsuccessful Follow-up Telephone Call  Date of discharge and from where:  01/24/2023 [facility]  Attempts:  2nd Attempt  Reason for unsuccessful TCM follow-up call:  Left voice message  Ericia Moxley [facility]  [facility] Population Health Community Resource Care Guide   [email or web address]  ?? [phone]   Website: [email or web address]  [email or web address]

## 2023-02-11 ENCOUNTER — Other Ambulatory Visit: Payer: Self-pay

## 2023-02-11 DIAGNOSIS — S46811S Strain of other muscles, fascia and tendons at shoulder and upper arm level, right arm, sequela: Secondary | ICD-10-CM

## 2023-02-15 ENCOUNTER — Other Ambulatory Visit: Payer: Self-pay | Admitting: Adult Health

## 2023-02-19 ENCOUNTER — Ambulatory Visit (HOSPITAL_BASED_OUTPATIENT_CLINIC_OR_DEPARTMENT_OTHER): Payer: Medicare Other

## 2023-02-22 ENCOUNTER — Other Ambulatory Visit: Payer: Self-pay | Admitting: Adult Health

## 2023-02-22 DIAGNOSIS — R569 Unspecified convulsions: Secondary | ICD-10-CM

## 2023-02-22 DIAGNOSIS — E782 Mixed hyperlipidemia: Secondary | ICD-10-CM

## 2023-02-22 DIAGNOSIS — I2119 ST elevation (STEMI) myocardial infarction involving other coronary artery of inferior wall: Secondary | ICD-10-CM

## 2023-02-22 DIAGNOSIS — E118 Type 2 diabetes mellitus with unspecified complications: Secondary | ICD-10-CM

## 2023-02-23 ENCOUNTER — Encounter: Payer: Self-pay | Admitting: Adult Health

## 2023-02-23 ENCOUNTER — Ambulatory Visit (INDEPENDENT_AMBULATORY_CARE_PROVIDER_SITE_OTHER): Payer: Medicare Other | Admitting: Adult Health

## 2023-02-23 VITALS — BP 124/80 | HR 70 | Temp 97.5°F | Ht 63.0 in | Wt 131.2 lb

## 2023-02-23 DIAGNOSIS — I10 Essential (primary) hypertension: Secondary | ICD-10-CM

## 2023-02-23 DIAGNOSIS — E118 Type 2 diabetes mellitus with unspecified complications: Secondary | ICD-10-CM

## 2023-02-23 DIAGNOSIS — S46811A Strain of other muscles, fascia and tendons at shoulder and upper arm level, right arm, initial encounter: Secondary | ICD-10-CM

## 2023-02-23 DIAGNOSIS — S46811S Strain of other muscles, fascia and tendons at shoulder and upper arm level, right arm, sequela: Secondary | ICD-10-CM

## 2023-02-23 DIAGNOSIS — Z794 Long term (current) use of insulin: Secondary | ICD-10-CM | POA: Diagnosis not present

## 2023-02-23 MED ORDER — CYCLOBENZAPRINE HCL 10 MG PO TABS
10.0000 mg | ORAL_TABLET | Freq: Two times a day (BID) | ORAL | 0 refills | Status: DC | PRN
Start: 2023-02-23 — End: 2023-04-13

## 2023-02-23 MED ORDER — OXYCODONE-ACETAMINOPHEN 10-325 MG PO TABS
1.0000 | ORAL_TABLET | Freq: Four times a day (QID) | ORAL | 0 refills | Status: AC | PRN
Start: 2023-02-23 — End: 2023-02-28

## 2023-02-23 NOTE — Progress Notes (Addendum)
Subjective:    Patient ID: [name], female    DOB: [birth year], 67 y.o.   MRN: [id number]  HPI 67 year old female who  has a past medical history of Acute MI, inferior wall, initial episode of care [facility]) (07/08/2012), Anxiety, Asthma, Coronary artery disease, Depression, Diabetes mellitus, Fibromyalgia, Leukocytosis, Monocytosis, Peripheral arterial disease (HCC) (01/23/2013), and Tobacco use disorder (07/08/2012).  She presents to the office today for right shoulder pain. She had an MRI done which showed IMPRESSION: 1. High-grade partial-thickness articular surface tear of the supraspinatus tendon. 2. Mild tendinosis of the infraspinatus tendon. 3. A 3.7 x 4 x 1.2 cm simple lipoma deep to the proximal lateral deltoid muscle and lateral to the humeral neck.   She has not been able to get in touch with orthopedics ( they have called and left a voice mail). She reports that her husband had a massive heart attack this week and is currently in the hospital having open heart surgery. She is having a lot of pain that tramadol dose not help   Additionally she is due for follow up regarding DM and HTN   DM Type 2 -she is currently managed with Lantus 6 units in the morning and 6 units in the evening as well as glipizide milligrams extended release daily.  The past she was on GLP 1 therapy but this had to be stopped due to GI issues.  She reports that her blood sugars have been pretty well-controlled with most readings in the 120s to 140s.  She has had very few hypoglycemic events.  Hypertension -managed with amlodipine 5 mg daily and Inderal 40 mg twice daily.  She denies dizziness, lightheadedness, chest pain, or shortness of breath  Review of Systems See HPI   Past Medical History:  Diagnosis Date   Acute MI, inferior wall, initial episode of care [facility]) 07/08/2012   Cath-07/08/11 -distal RCA stent DES (99%), LAD 20-30%. EF 50% inferior hypokinesis  (infarct)   Anxiety    Asthma     Coronary artery disease    Depression    Diabetes mellitus    Fibromyalgia    Leukocytosis    Monocytosis    Peripheral arterial disease (HCC) 01/23/2013   Tobacco use disorder 07/08/2012    Social History   Socioeconomic History   Marital status: Married    Spouse name: Not on file   Number of children: 3   Years of education: college   Highest education level: Not on file  Occupational History   Not on file  Tobacco Use   Smoking status: Every Day    Packs/day: 1.00    Years: 43.00    Additional pack years: 0.00    Total pack years: 43.00    Types: Cigarettes   Smokeless tobacco: Never  Substance and Sexual Activity   Alcohol use: No    Alcohol/week: 0.0 standard drinks of alcohol    Comment: Rarely   Drug use: No   Sexual activity: Yes  Other Topics Concern   Not on file  Social History Narrative   Lives with husband, married for 20 years.      They have three grown chlildren.   She is not currently working and is on disability   Highest level of education:  Engineer, maintenance (IT)   Pets: two dogs and three rabbits.       Husband is a long Production assistant, radio.       Is unable to enjoy any activities because of  body pains.       Social Determinants of Health   Financial Resource Strain: Medium Risk (06/14/2022)   Overall Financial Resource Strain (CARDIA)    Difficulty of Paying Living Expenses: Somewhat hard  Food Insecurity: No Food Insecurity (02/23/2022)   Hunger Vital Sign    Worried About Running Out of Food in the Last Year: Never true    Ran Out of Food in the Last Year: Never true  Transportation Needs: No Transportation Needs (02/23/2022)   PRAPARE - Administrator, Civil Service (Medical): No    Lack of Transportation (Non-Medical): No  Physical Activity: Inactive (02/23/2022)   Exercise Vital Sign    Days of Exercise per Week: 0 days    Minutes of Exercise per Session: 0 min  Stress: No Stress Concern Present (02/23/2022)   [employer] of Occupational Health - Occupational Stress Questionnaire    Feeling of Stress : Not at all  Social Connections: Socially Isolated (02/23/2022)   Social Connection and Isolation Panel [NHANES]    Frequency of Communication with Friends and Family: Once a week    Frequency of Social Gatherings with Friends and Family: Once a week    Attends Religious Services: Never    Database administrator or Organizations: No    Attends Banker Meetings: Never    Marital Status: Married  Catering manager Violence: Not At Risk (02/19/2021)   Humiliation, Afraid, Rape, and Kick questionnaire    Fear of Current or Ex-Partner: No    Emotionally Abused: No    Physically Abused: No    Sexually Abused: No    Past Surgical History:  Procedure Laterality Date   ABDOMINAL AORTAGRAM N/A 02/07/2013   Procedure: ABDOMINAL Ronny Flurry;  Surgeon: Iran Ouch, MD;  Location: [facility] CATH LAB;  Service: Cardiovascular;  Laterality: N/A;   CARDIAC CATHETERIZATION     CORONARY STENT PLACEMENT     CYST REMOVAL TRUNK  1977   tailbone   LEFT HEART CATHETERIZATION WITH CORONARY ANGIOGRAM N/A 07/07/2012   Procedure: LEFT HEART CATHETERIZATION WITH CORONARY ANGIOGRAM;  Surgeon: Kathleene Hazel, MD;  Location: [facility] CATH LAB;  Service: Cardiovascular;  Laterality: N/A;   PERCUTANEOUS CORONARY STENT INTERVENTION (PCI-S)  07/07/2012   Procedure: PERCUTANEOUS CORONARY STENT INTERVENTION (PCI-S);  Surgeon: Kathleene Hazel, MD;  Location: [facility] CATH LAB;  Service: Cardiovascular;;   TONSILLECTOMY      Family History  Problem Relation Age of Onset   Kidney disease Mother    Diabetes Mother        DM Type II   Alzheimer's disease Mother    Diabetes Father        IDDM   Heart disease Father    Cancer Father        bone cancer died at 7   Cancer Sister        bone cancer at age 7   Multiple sclerosis Daughter    Diabetes Son 2       DM Type I   Heart disease Maternal Grandfather    Heart  disease Paternal Grandmother    Diabetes Son 30       DM Type I    Allergies  Allergen Reactions   Erythromycin Other (See Comments)   Statins     SEVERE MUSCLE PAIN - failed lipitor, Livalo, red yeast rice (lovastatin), and she thinks Zocor and pravastatin   Ace Inhibitors Cough    Patient refuses to take meds  Cymbalta [Duloxetine Hcl]     Drowsiness   Doxycycline Other (See Comments)    diarrhea   Jardiance [Empagliflozin]     Yeast infections      Current Outpatient Medications on File Prior to Visit  Medication Sig Dispense Refill   amLODipine (NORVASC) 5 MG tablet Take 1 tablet by mouth once daily 90 tablet 0   busPIRone (BUSPAR) 15 MG tablet TAKE 1 TABLET BY MOUTH THREE TIMES DAILY 270 tablet 0   Cholecalciferol (VITAMIN D3) 2000 units TABS Take 1 tablet by mouth daily.     cilostazol (PLETAL) 100 MG tablet Take 0.5 tablets (50 mg total) by mouth 2 (two) times daily. 90 tablet 3   Continuous Blood Gluc Receiver (DEXCOM G7 RECEIVER) DEVI Use with Dexcom G7 sensory 1 each 0   Continuous Glucose Sensor (DEXCOM G7 SENSOR) MISC USE ONE SENSOR EVERY 10 DAYS 3 each 0   FLUoxetine (PROZAC) 40 MG capsule Take 1 capsule (40 mg total) by mouth daily. 90 capsule 0   glipiZIDE (GLUCOTROL XL) 10 MG 24 hr tablet Take 1 tablet by mouth once daily with breakfast 90 tablet 0   isosorbide mononitrate (IMDUR) 60 MG 24 hr tablet TAKE 1 & 1/2 (ONE & ONE-HALF) TABLETS BY MOUTH ONCE DAILY 135 tablet 0   LANTUS SOLOSTAR 100 UNIT/ML Solostar Pen Inject 10 Units into the skin 2 (two) times daily. (Patient taking differently: Inject 6 Units into the skin 2 (two) times daily.) 6 mL 2   levETIRAcetam (KEPPRA) 250 MG tablet TAKE 1 TABLET BY MOUTH THREE TIMES DAILY 270 tablet 0   nitroGLYCERIN (NITROSTAT) 0.4 MG SL tablet Place 1 tablet (0.4 mg total) under the tongue every 5 (five) minutes x 3 doses as needed for chest pain. 30 tablet 12   nystatin cream (MYCOSTATIN) Apply 1 application topically 2 (two)  times daily. 30 g 2   pravastatin (PRAVACHOL) 40 MG tablet TAKE 1 TABLET BY MOUTH AT BEDTIME 90 tablet 0   pregabalin (LYRICA) 150 MG capsule Take 1 capsule (150 mg total) by mouth 2 (two) times daily. 60 capsule 2   propranolol (INDERAL) 40 MG tablet Take 1 tablet (40 mg total) by mouth 2 (two) times daily. 180 tablet 0   No current facility-administered medications on file prior to visit.    BP 124/80 (BP Location: Left Arm, Patient Position: Sitting, Cuff Size: Normal)   Pulse 70   Temp (!) 97.5 F (36.4 C) (Oral)   Ht 5\' 3"  (1.6 m)   Wt 131 lb 3.2 oz (59.5 kg)   SpO2 98%   BMI 23.24 kg/m       Objective:   Physical Exam Vitals and nursing note reviewed.  Constitutional:      Appearance: Normal appearance.  Cardiovascular:     Rate and Rhythm: Normal rate and regular rhythm.     Pulses: Normal pulses.     Heart sounds: Normal heart sounds.  Pulmonary:     Effort: Pulmonary effort is normal.     Breath sounds: Normal breath sounds.  Musculoskeletal:        General: Tenderness present. Normal range of motion.     Comments: Right arm in sling  Skin:    General: Skin is warm and dry.  Neurological:     General: No focal deficit present.     Mental Status: She is alert and oriented to person, place, and time.  Psychiatric:        Mood and Affect: Mood normal.  Behavior: Behavior normal.        Thought Content: Thought content normal.        Judgment: Judgment normal.           Assessment & Plan:  1. Traumatic rupture of supraspinatus tendon of right shoulder, sequela -Prescribe a short course of Percocet and refill Flexeril also seems to help.  She was advised not to take the oxycodone and Flexeril at the same time.  Number given for orthopedics, she will call and schedule an appointment - oxyCODONE-acetaminophen (PERCOCET) 10-325 MG tablet; Take 1 tablet by mouth every 6 (six) hours as needed for up to 5 days for pain.  Dispense: 28 tablet; Refill: 0 -  cyclobenzaprine (FLEXERIL) 10 MG tablet; Take 1 tablet (10 mg total) by mouth 2 (two) times daily as needed for muscle spasms.  Dispense: 20 tablet; Refill: 0  2. Type 2 diabetes mellitus with complication, without long-term current use of insulin (HCC) - Consider increase in Lantus  - Hemoglobin A1c; Future - Comprehensive metabolic panel; Future - Comprehensive metabolic panel - Hemoglobin A1c  3. Essential hypertension - Controlled. No change in medication  - Comprehensive metabolic panel; Future - Comprehensive metabolic panel  Shirline Frees, NP

## 2023-02-23 NOTE — Patient Instructions (Signed)
Please call orthopedics at   t [phone] ext.[phone]

## 2023-02-24 LAB — COMPREHENSIVE METABOLIC PANEL
ALT: 7 U/L (ref 0–35)
AST: 12 U/L (ref 0–37)
Albumin: 4 g/dL (ref 3.5–5.2)
Alkaline Phosphatase: 68 U/L (ref 39–117)
BUN: 26 mg/dL — ABNORMAL HIGH (ref 6–23)
CO2: 23 mEq/L (ref 19–32)
Calcium: 10.1 mg/dL (ref 8.4–10.5)
Chloride: 109 mEq/L (ref 96–112)
Creatinine, Ser: 1.56 mg/dL — ABNORMAL HIGH (ref 0.40–1.20)
GFR: 34.8 mL/min — ABNORMAL LOW (ref >60.00)
Glucose, Bld: 115 mg/dL — ABNORMAL HIGH (ref 70–99)
Potassium: 4.2 mEq/L (ref 3.5–5.1)
Sodium: 142 mEq/L (ref 135–145)
Total Bilirubin: 0.2 mg/dL (ref 0.2–1.2)
Total Protein: 6.9 g/dL (ref 6.0–8.3)

## 2023-02-24 LAB — HEMOGLOBIN A1C: Hgb A1c MFr Bld: 7.6 % — ABNORMAL HIGH (ref 4.6–6.5)

## 2023-03-02 ENCOUNTER — Ambulatory Visit: Payer: Medicare Other | Admitting: Surgical

## 2023-03-03 ENCOUNTER — Telehealth: Payer: Self-pay | Admitting: Adult Health

## 2023-03-03 NOTE — Telephone Encounter (Signed)
Katelyn pharm tech is calling and need clarification on levETIRAcetam (KEPPRA) 250 MG tablet please call  [facility] [address] [place], [place] - [address] Phone: [phone]  Fax: [phone]

## 2023-03-03 NOTE — Telephone Encounter (Signed)
Pt notified per Kandee Keen that she should only be taking TID. Pt also advised that its too early to fill this Rx. Pt picked up 270 tabs 02/23/2023 and now has 8 pills left. Pt verbalized understanding and stated she will try to make sure she has the right bottle. No further action needed at this time.

## 2023-03-16 ENCOUNTER — Ambulatory Visit (INDEPENDENT_AMBULATORY_CARE_PROVIDER_SITE_OTHER): Payer: Medicare Other | Admitting: Surgical

## 2023-03-16 ENCOUNTER — Other Ambulatory Visit: Payer: Self-pay

## 2023-03-16 DIAGNOSIS — M25511 Pain in right shoulder: Secondary | ICD-10-CM

## 2023-03-16 DIAGNOSIS — S46811A Strain of other muscles, fascia and tendons at shoulder and upper arm level, right arm, initial encounter: Secondary | ICD-10-CM | POA: Diagnosis not present

## 2023-03-16 DIAGNOSIS — M7521 Bicipital tendinitis, right shoulder: Secondary | ICD-10-CM | POA: Diagnosis not present

## 2023-03-17 ENCOUNTER — Encounter: Payer: Self-pay | Admitting: Surgical

## 2023-03-17 MED ORDER — METHYLPREDNISOLONE ACETATE 40 MG/ML IJ SUSP
40.0000 mg | INTRAMUSCULAR | Status: AC | PRN
Start: 2023-03-16 — End: 2023-03-16
  Administered 2023-03-16: 40 mg via INTRA_ARTICULAR

## 2023-03-17 MED ORDER — BUPIVACAINE HCL 0.5 % IJ SOLN
9.0000 mL | INTRAMUSCULAR | Status: AC | PRN
Start: 2023-03-16 — End: 2023-03-16
  Administered 2023-03-16: 9 mL via INTRA_ARTICULAR

## 2023-03-17 MED ORDER — LIDOCAINE HCL 1 % IJ SOLN
5.0000 mL | INTRAMUSCULAR | Status: AC | PRN
Start: 2023-03-16 — End: 2023-03-16
  Administered 2023-03-16: 5 mL

## 2023-03-17 NOTE — Progress Notes (Signed)
Office Visit Note   Patient: [name]           Date of Birth: Dec 05, 1955           MRN: [id number] Visit Date: 03/16/2023 Requested by: Shirline Frees, NP [address] [place],  [place] [address] PCP: Shirline Frees, NP  Subjective: Chief Complaint  Patient presents with   Right Shoulder - Pain    HPI: [name] is a 67 y.o. female who presents to the office reporting right shoulder pain.  Patient states that she has had about 2 months of right shoulder pain.  About 2 months ago she was playing tug-of-war with her Scotty terrier when she felt a pulling sensation in her arm.  Did not really have much pain at that time but 2 days later she began to notice increased pain that got worse and worse.  She mostly localizes pain to the posterior lateral aspect of the right shoulder with radiation down to the elbow and sometimes down to the wrist as well.  She also has associated scapular pain and neck pain though the neck pain is chronic for her.  Neck pain has been worse recently.  She has had numbness and tingling extending down the entire length of the arm into the index and middle fingers of her right hand that persisted for about 1.5 months after onset of symptoms but has now been resolved for about 2 weeks.  She describes muscle spasm in the shoulder and neck.  She also has burning sensation in the same distribution of her pain.  No history of prior surgery to her neck or shoulder.  She has not had any injections or physical therapy.  She does have history of fibromyalgia and diabetes with last A1c 7.6.  She follows with cardiology and her cardiologist is Dr. Antoine Poche; she has history of prior MI about 10 years ago.  Does not take any blood thinners.  She is hoping to avoid surgery..                ROS: All systems reviewed are negative as they relate to the chief complaint within the history of present illness.  Patient denies fevers or chills.  Assessment & Plan: Visit  Diagnoses:  1. Traumatic rupture of supraspinatus tendon of right shoulder, initial encounter   2. Acute pain of right shoulder   3. Biceps tendinitis, right     Plan: Patient is a 67 year old female who presents for evaluation of right shoulder pain.  She was playing tug-of-war with her small dog about 2 months ago and attributes her onset of symptoms to this event though her symptoms really started about 2 to 3 days after the game of tug-of-war.  She has had MRI without contrast ordered by her PCP demonstrating partial-thickness supraspinatus tear on the articular side.  She also has lipoma noted on MRI that is palpable on exam today but nontender.  No significant weakness of the rotator cuff and she is able to lift her arm overhead though she does have discomfort with this.  We discussed options available patient and after discussion she would like to try shoulder injection and working with physical therapy to optimize rotator cuff strength.  We will try glenohumeral injection today given the articular sided nature of the tear as well as the presence of findings of bicep tendinitis.  Under ultrasound guidance, cortisone injection successfully delivered into the glenohumeral joint and patient tolerated procedure well.  She will work with physical  therapy primarily on full active and passive range of motion exercises and rotator cuff strengthening exercises.  Will see her back in 2 months for clinical recheck.  Did recommend that she let me know how this injection does for her as there is a fairly high likelihood that some of her pain is referred from the cervical spine with radicular pain extending down into her wrist and hand over the last 2 months in the same timeframe.  Follow-Up Instructions: No follow-ups on file.   Orders:  Orders Placed This Encounter  Procedures   US Guided Needle Placement - No Linked Charges   Ambulatory referral to Physical Therapy   No orders of the defined types were  placed in this encounter.     Procedures: Large Joint Inj: R glenohumeral on 03/16/2023 11:00 AM Indications: diagnostic evaluation and pain Details: 22 G 1.5 in and 3.5 in needle, ultrasound-guided posterior approach  Arthrogram: No  Medications: 9 mL bupivacaine 0.5 %; 40 mg methylPREDNISolone acetate 40 MG/ML; 5 mL lidocaine 1 % Outcome: tolerated well, no immediate complications Procedure, treatment alternatives, risks and benefits explained, specific risks discussed. Consent was given by the patient. Immediately prior to procedure a time out was called to verify the correct patient, procedure, equipment, support staff and site/side marked as required. Patient was prepped and draped in the usual sterile fashion.       Clinical Data: No additional findings.  Objective: Vital Signs: There were no vitals taken for this visit.  Physical Exam:  Constitutional: Patient appears well-developed HEENT:  Head: Normocephalic Eyes:EOM are normal Neck: Normal range of motion Cardiovascular: Normal rate Pulmonary/chest: Effort normal Neurologic: Patient is alert Skin: Skin is warm Psychiatric: Patient has normal mood and affect  Ortho Exam: Ortho exam demonstrates right shoulder with 80 degrees X rotation, 110 degrees abduction, 170 degrees forward elevation passively and actively.  This compared to the left shoulder with 60 degrees X rotation, 110 degrees abduction, 170 degrees forward elevation passively and actively.  Excellent rotator cuff strength of supra, infra, subscap which does reproduce some of her pain though she has good strength.  Rotator cuff strength graded 5/5.  Negative empty can test with no weakness but does reproduce pain.  She has positive O'Brien sign.  Positive tenderness over the bicipital groove and the [facility] joint as well.  Intact EPL, FPL, finger abduction, pronation/supination, bicep, tricep, deltoid.  She does have reproduced pain in the shoulder with rotation  cervical spine.  She also has positive Lhermitte sign reproducing shoulder pain.  Negative Spurling sign with no reproduction of radicular pain but does reproduce neck pain.  Specialty Comments:  No specialty comments available.  Imaging: No results found.   PMFS History: Patient Active Problem List   Diagnosis Date Noted   Coronary artery disease involving native coronary artery of native heart without angina pectoris 12/23/2020   Type 2 diabetes mellitus with complication, without long-term current use of insulin (HCC) 12/23/2020   Hyperlipidemia 01/31/2014   Pain in limb 12/26/2013   Disturbance of skin sensation 12/26/2013   Peripheral arterial disease (HCC) 01/23/2013   Leukocytosis    Monocytosis    Acute MI, inferior wall, initial episode of care (HCC) 07/08/2012   Tobacco use disorder 07/08/2012   Fibromyalgia 07/08/2012   Depression 07/08/2012   Diabetes mellitus type II, uncontrolled (HCC) 07/08/2012   Past Medical History:  Diagnosis Date   Acute MI, inferior wall, initial episode of care [facility]) 07/08/2012   Cath-07/08/11 -distal RCA stent  DES (99%), LAD 20-30%. EF 50% inferior hypokinesis  (infarct)   Anxiety    Asthma    Coronary artery disease    Depression    Diabetes mellitus    Fibromyalgia    Leukocytosis    Monocytosis    Peripheral arterial disease (HCC) 01/23/2013   Tobacco use disorder 07/08/2012    Family History  Problem Relation Age of Onset   Kidney disease Mother    Diabetes Mother        DM Type II   Alzheimer's disease Mother    Diabetes Father        IDDM   Heart disease Father    Cancer Father        bone cancer died at 24   Cancer Sister        bone cancer at age 70   Multiple sclerosis Daughter    Diabetes Son 2       DM Type I   Heart disease Maternal Grandfather    Heart disease Paternal Grandmother    Diabetes Son 52       DM Type I    Past Surgical History:  Procedure Laterality Date   ABDOMINAL AORTAGRAM N/A 02/07/2013    Procedure: ABDOMINAL Ronny Flurry;  Surgeon: Iran Ouch, MD;  Location: [facility] CATH LAB;  Service: Cardiovascular;  Laterality: N/A;   CARDIAC CATHETERIZATION     CORONARY STENT PLACEMENT     CYST REMOVAL TRUNK  1977   tailbone   LEFT HEART CATHETERIZATION WITH CORONARY ANGIOGRAM N/A 07/07/2012   Procedure: LEFT HEART CATHETERIZATION WITH CORONARY ANGIOGRAM;  Surgeon: Kathleene Hazel, MD;  Location: [facility] CATH LAB;  Service: Cardiovascular;  Laterality: N/A;   PERCUTANEOUS CORONARY STENT INTERVENTION (PCI-S)  07/07/2012   Procedure: PERCUTANEOUS CORONARY STENT INTERVENTION (PCI-S);  Surgeon: Kathleene Hazel, MD;  Location: [facility] CATH LAB;  Service: Cardiovascular;;   TONSILLECTOMY     Social History   Occupational History   Not on file  Tobacco Use   Smoking status: Every Day    Packs/day: 1.00    Years: 43.00    Additional pack years: 0.00    Total pack years: 43.00    Types: Cigarettes   Smokeless tobacco: Never  Substance and Sexual Activity   Alcohol use: No    Alcohol/week: 0.0 standard drinks of alcohol    Comment: Rarely   Drug use: No   Sexual activity: Yes

## 2023-03-30 ENCOUNTER — Ambulatory Visit: Payer: Medicare Other | Admitting: Physical Therapy

## 2023-04-01 ENCOUNTER — Other Ambulatory Visit: Payer: Self-pay | Admitting: Adult Health

## 2023-04-01 DIAGNOSIS — I10 Essential (primary) hypertension: Secondary | ICD-10-CM

## 2023-04-06 ENCOUNTER — Ambulatory Visit: Payer: Medicare Other | Admitting: Physical Therapy

## 2023-04-13 ENCOUNTER — Ambulatory Visit: Payer: Medicare Other

## 2023-04-13 VITALS — Ht 63.0 in | Wt 131.0 lb

## 2023-04-13 DIAGNOSIS — Z0000 Encounter for general adult medical examination without abnormal findings: Secondary | ICD-10-CM

## 2023-04-13 NOTE — Patient Instructions (Addendum)
[name] , Thank you for taking time to come for your Medicare Wellness Visit. I appreciate your ongoing commitment to your health goals. Please review the following plan we discussed and let me know if I can assist you in the future.   These are the goals we discussed:  Goals       LDL CALC < 70      No current goals (pt-stated)      I just want to get by.      Quit Smoking        This is a list of the screening recommended for you and due dates:  Health Maintenance  Topic Date Due   DTaP/Tdap/Td vaccine (1 - Tdap) Never done   Pap Smear  Never done   Complete foot exam   02/25/2023   Yearly kidney health urinalysis for diabetes  04/14/2023*   COVID-19 Vaccine (1 - 2023-24 season) 04/29/2023*   Zoster (Shingles) Vaccine (1 of 2) 05/26/2023*   Colon Cancer Screening  02/29/2024*   Screening for Lung Cancer  04/12/2024*   Pneumonia Vaccine (1 of 2 - PCV) 04/12/2024*   Mammogram  04/12/2024*   DEXA scan (bone density measurement)  04/12/2024*   Flu Shot  04/28/2023   Hemoglobin A1C  08/26/2023   Eye exam for diabetics  01/24/2024   Yearly kidney function blood test for diabetes  02/23/2024   Medicare Annual Wellness Visit  04/12/2024   Hepatitis C Screening  Completed   HIV Screening  Completed   HPV Vaccine  Aged Out  *Topic was postponed. The date shown is not the original due date.    Advanced directives: Advance directive discussed with you today. Even though you declined this today, please call our office should you change your mind, and we can give you the proper paperwork for you to fill out.   Conditions/risks identified: None  Next appointment: Follow up in one year for your annual wellness visit    Preventive Care 65 Years and Older, Female Preventive care refers to lifestyle choices and visits with your health care provider that can promote health and wellness. What does preventive care include? A yearly physical exam. This is also called an annual  well check. Dental exams once or twice a year. Routine eye exams. Ask your health care provider how often you should have your eyes checked. Personal lifestyle choices, including: Daily care of your teeth and gums. Regular physical activity. Eating a healthy diet. Avoiding tobacco and drug use. Limiting alcohol use. Practicing safe sex. Taking low-dose aspirin every day. Taking vitamin and mineral supplements as recommended by your health care provider. What happens during an annual well check? The services and screenings done by your health care provider during your annual well check will depend on your age, overall health, lifestyle risk factors, and family history of disease. Counseling  Your health care provider may ask you questions about your: Alcohol use. Tobacco use. Drug use. Emotional well-being. Home and relationship well-being. Sexual activity. Eating habits. History of falls. Memory and ability to understand (cognition). Work and work Astronomer. Reproductive health. Screening  You may have the following tests or measurements: Height, weight, and BMI. Blood pressure. Lipid and cholesterol levels. These may be checked every 5 years, or more frequently if you are over 9 years old. Skin check. Lung cancer screening. You may have this screening every year starting at age 88 if you have a 30-pack-year history of smoking and currently smoke or have quit  within the past 15 years. Fecal occult blood test (FOBT) of the stool. You may have this test every year starting at age 48. Flexible sigmoidoscopy or colonoscopy. You may have a sigmoidoscopy every 5 years or a colonoscopy every 10 years starting at age 41. Hepatitis C blood test. Hepatitis B blood test. Sexually transmitted disease (STD) testing. Diabetes screening. This is done by checking your blood sugar (glucose) after you have not eaten for a while (fasting). You may have this done every 1-3 years. Bone density  scan. This is done to screen for osteoporosis. You may have this done starting at age 84. Mammogram. This may be done every 1-2 years. Talk to your health care provider about how often you should have regular mammograms. Talk with your health care provider about your test results, treatment options, and if necessary, the need for more tests. Vaccines  Your health care provider may recommend certain vaccines, such as: Influenza vaccine. This is recommended every year. Tetanus, diphtheria, and acellular pertussis (Tdap, Td) vaccine. You may need a Td booster every 10 years. Zoster vaccine. You may need this after age 24. Pneumococcal 13-valent conjugate (PCV13) vaccine. One dose is recommended after age 28. Pneumococcal polysaccharide (PPSV23) vaccine. One dose is recommended after age 39. Talk to your health care provider about which screenings and vaccines you need and how often you need them. This information is not intended to replace advice given to you by your health care provider. Make sure you discuss any questions you have with your health care provider. Document Released: 10/10/2015 Document Revised: 06/02/2016 Document Reviewed: 07/15/2015 Elsevier Interactive Patient Education  2017 [employer].  Fall Prevention in the Home Falls can cause injuries. They can happen to people of all ages. There are many things you can do to make your home safe and to help prevent falls. What can I do on the outside of my home? Regularly fix the edges of walkways and driveways and fix any cracks. Remove anything that might make you trip as you walk through a door, such as a raised step or threshold. Trim any bushes or trees on the path to your home. Use bright outdoor lighting. Clear any walking paths of anything that might make someone trip, such as rocks or tools. Regularly check to see if handrails are loose or broken. Make sure that both sides of any steps have handrails. Any raised decks and  porches should have guardrails on the edges. Have any leaves, snow, or ice cleared regularly. Use sand or salt on walking paths during winter. Clean up any spills in your garage right away. This includes oil or grease spills. What can I do in the bathroom? Use night lights. Install grab bars by the toilet and in the tub and shower. Do not use towel bars as grab bars. Use non-skid mats or decals in the tub or shower. If you need to sit down in the shower, use a plastic, non-slip stool. Keep the floor dry. Clean up any water that spills on the floor as soon as it happens. Remove soap buildup in the tub or shower regularly. Attach bath mats securely with double-sided non-slip rug tape. Do not have throw rugs and other things on the floor that can make you trip. What can I do in the bedroom? Use night lights. Make sure that you have a light by your bed that is easy to reach. Do not use any sheets or blankets that are too big for your bed. They  should not hang down onto the floor. Have a firm chair that has side arms. You can use this for support while you get dressed. Do not have throw rugs and other things on the floor that can make you trip. What can I do in the kitchen? Clean up any spills right away. Avoid walking on wet floors. Keep items that you use a lot in easy-to-reach places. If you need to reach something above you, use a strong step stool that has a grab bar. Keep electrical cords out of the way. Do not use floor polish or wax that makes floors slippery. If you must use wax, use non-skid floor wax. Do not have throw rugs and other things on the floor that can make you trip. What can I do with my stairs? Do not leave any items on the stairs. Make sure that there are handrails on both sides of the stairs and use them. Fix handrails that are broken or loose. Make sure that handrails are as long as the stairways. Check any carpeting to make sure that it is firmly attached to the  stairs. Fix any carpet that is loose or worn. Avoid having throw rugs at the top or bottom of the stairs. If you do have throw rugs, attach them to the floor with carpet tape. Make sure that you have a light switch at the top of the stairs and the bottom of the stairs. If you do not have them, ask someone to add them for you. What else can I do to help prevent falls? Wear shoes that: Do not have high heels. Have rubber bottoms. Are comfortable and fit you well. Are closed at the toe. Do not wear sandals. If you use a stepladder: Make sure that it is fully opened. Do not climb a closed stepladder. Make sure that both sides of the stepladder are locked into place. Ask someone to hold it for you, if possible. Clearly mark and make sure that you can see: Any grab bars or handrails. First and last steps. Where the edge of each step is. Use tools that help you move around (mobility aids) if they are needed. These include: Canes. Walkers. Scooters. Crutches. Turn on the lights when you go into a dark area. Replace any light bulbs as soon as they burn out. Set up your furniture so you have a clear path. Avoid moving your furniture around. If any of your floors are uneven, fix them. If there are any pets around you, be aware of where they are. Review your medicines with your doctor. Some medicines can make you feel dizzy. This can increase your chance of falling. Ask your doctor what other things that you can do to help prevent falls. This information is not intended to replace advice given to you by your health care provider. Make sure you discuss any questions you have with your health care provider. Document Released: 07/10/2009 Document Revised: 02/19/2016 Document Reviewed: 10/18/2014 Elsevier Interactive Patient Education  2017 [employer].

## 2023-04-13 NOTE — Progress Notes (Signed)
Subjective:   [name] is a 67 y.o. female who presents for Medicare Annual (Subsequent) preventive examination.  Visit Complete: Virtual  I connected with  [name] on 04/13/23 by a audio enabled telemedicine application and verified that I am speaking with the correct person using two identifiers.  Patient Location: Home  Provider Location: Home Office  I discussed the limitations of evaluation and management by telemedicine. The patient expressed understanding and agreed to proceed.  Patient Medicare AWV questionnaire was completed by the patient on  ; I have confirmed that all information answered by patient is correct and no changes since this date.  Review of Systems      Cardiac Risk Factors include: advanced age (>10men, >33 women);diabetes mellitus;smoking/ tobacco exposure     Objective:    Today's Vitals   04/13/23 1505  Weight: 131 lb (59.4 kg)  Height: 5\' 3"  (1.6 m)   Body mass index is 23.21 kg/m.     04/13/2023    3:18 PM 05/17/2022    3:10 PM 02/23/2022    3:00 PM 08/21/2018    9:26 PM 03/12/2015   11:37 AM 02/07/2013    7:14 AM 07/07/2012    9:58 PM  Advanced Directives  Does Patient Have a Medical Advance Directive? No No No No No Patient does not have advance directive Patient would not like information  Would patient like information on creating a medical advance directive? No - Patient declined    No - patient declined information    Pre-existing out of facility DNR order (yellow form or pink MOST form)      No No    Current Medications (verified) Outpatient Encounter Medications as of 04/13/2023  Medication Sig   amLODipine (NORVASC) 5 MG tablet Take 1 tablet by mouth once daily   busPIRone (BUSPAR) 15 MG tablet TAKE 1 TABLET BY MOUTH THREE TIMES DAILY   Cholecalciferol (VITAMIN D3) 2000 units TABS Take 1 tablet by mouth daily.   cilostazol (PLETAL) 100 MG tablet Take 0.5 tablets (50 mg total) by mouth 2 (two) times daily.    Continuous Blood Gluc Receiver (DEXCOM G7 RECEIVER) DEVI Use with Dexcom G7 sensory   Continuous Glucose Sensor (DEXCOM G7 SENSOR) MISC USE 1 SENSOR EVERY 10 DAYS   FLUoxetine (PROZAC) 40 MG capsule Take 1 capsule (40 mg total) by mouth daily.   glipiZIDE (GLUCOTROL XL) 10 MG 24 hr tablet Take 1 tablet by mouth once daily with breakfast   isosorbide mononitrate (IMDUR) 60 MG 24 hr tablet TAKE 1 & 1/2 (ONE & ONE-HALF) TABLETS BY MOUTH ONCE DAILY   LANTUS SOLOSTAR 100 UNIT/ML Solostar Pen Inject 10 Units into the skin 2 (two) times daily. (Patient taking differently: Inject 8 Units into the skin 2 (two) times daily.)   levETIRAcetam (KEPPRA) 250 MG tablet TAKE 1 TABLET BY MOUTH THREE TIMES DAILY   nitroGLYCERIN (NITROSTAT) 0.4 MG SL tablet Place 1 tablet (0.4 mg total) under the tongue every 5 (five) minutes x 3 doses as needed for chest pain.   nystatin cream (MYCOSTATIN) Apply 1 application topically 2 (two) times daily.   pravastatin (PRAVACHOL) 40 MG tablet TAKE 1 TABLET BY MOUTH AT BEDTIME   pregabalin (LYRICA) 150 MG capsule Take 1 capsule (150 mg total) by mouth 2 (two) times daily.   propranolol (INDERAL) 40 MG tablet Take 1 tablet by mouth twice daily   [DISCONTINUED] cyclobenzaprine (FLEXERIL) 10 MG tablet Take 1 tablet (10 mg total) by mouth 2 (two) times  daily as needed for muscle spasms.   No facility-administered encounter medications on file as of 04/13/2023.    Allergies (verified) Erythromycin, Statins, Ace inhibitors, Cymbalta [duloxetine hcl], Doxycycline, and Jardiance [empagliflozin]   History: Past Medical History:  Diagnosis Date   Acute MI, inferior wall, initial episode of care (HCC) 07/08/2012   Cath-07/08/11 -distal RCA stent DES (99%), LAD 20-30%. EF 50% inferior hypokinesis  (infarct)   Anxiety    Asthma    Coronary artery disease    Depression    Diabetes mellitus    Fibromyalgia    Leukocytosis    Monocytosis    Peripheral arterial disease (HCC) 01/23/2013    Tobacco use disorder 07/08/2012   Past Surgical History:  Procedure Laterality Date   ABDOMINAL AORTAGRAM N/A 02/07/2013   Procedure: ABDOMINAL Ronny Flurry;  Surgeon: Iran Ouch, MD;  Location: [facility] CATH LAB;  Service: Cardiovascular;  Laterality: N/A;   CARDIAC CATHETERIZATION     CORONARY STENT PLACEMENT     CYST REMOVAL TRUNK  1977   tailbone   LEFT HEART CATHETERIZATION WITH CORONARY ANGIOGRAM N/A 07/07/2012   Procedure: LEFT HEART CATHETERIZATION WITH CORONARY ANGIOGRAM;  Surgeon: Kathleene Hazel, MD;  Location: [facility] CATH LAB;  Service: Cardiovascular;  Laterality: N/A;   PERCUTANEOUS CORONARY STENT INTERVENTION (PCI-S)  07/07/2012   Procedure: PERCUTANEOUS CORONARY STENT INTERVENTION (PCI-S);  Surgeon: Kathleene Hazel, MD;  Location: [facility] CATH LAB;  Service: Cardiovascular;;   TONSILLECTOMY     Family History  Problem Relation Age of Onset   Kidney disease Mother    Diabetes Mother        DM Type II   Alzheimer's disease Mother    Diabetes Father        IDDM   Heart disease Father    Cancer Father        bone cancer died at 66   Cancer Sister        bone cancer at age 81   Multiple sclerosis Daughter    Diabetes Son 2       DM Type I   Heart disease Maternal Grandfather    Heart disease Paternal Grandmother    Diabetes Son 39       DM Type I   Social History   Socioeconomic History   Marital status: Married    Spouse name: Not on file   Number of children: 3   Years of education: college   Highest education level: Not on file  Occupational History   Not on file  Tobacco Use   Smoking status: Every Day    Current packs/day: 1.00    Average packs/day: 1 pack/day for 43.0 years (43.0 ttl pk-yrs)    Types: Cigarettes   Smokeless tobacco: Never  Substance and Sexual Activity   Alcohol use: No    Alcohol/week: 0.0 standard drinks of alcohol    Comment: Rarely   Drug use: No   Sexual activity: Yes  Other Topics Concern   Not on file  Social  History Narrative   Lives with husband, married for 20 years.      They have three grown chlildren.   She is not currently working and is on disability   Highest level of education:  Engineer, maintenance (IT)   Pets: two dogs and three rabbits.       Husband is a long Production assistant, radio.       Is unable to enjoy any activities because of body pains.  Social Determinants of Health   Financial Resource Strain: Low Risk  (04/13/2023)   Overall Financial Resource Strain (CARDIA)    Difficulty of Paying Living Expenses: Not hard at all  Food Insecurity: No Food Insecurity (04/13/2023)   Hunger Vital Sign    Worried About Running Out of Food in the Last Year: Never true    Ran Out of Food in the Last Year: Never true  Transportation Needs: No Transportation Needs (04/13/2023)   PRAPARE - Administrator, Civil Service (Medical): No    Lack of Transportation (Non-Medical): No  Physical Activity: Inactive (04/13/2023)   Exercise Vital Sign    Days of Exercise per Week: 0 days    Minutes of Exercise per Session: 0 min  Stress: No Stress Concern Present (04/13/2023)   [employer] of Occupational Health - Occupational Stress Questionnaire    Feeling of Stress : Not at all  Social Connections: Moderately Isolated (04/13/2023)   Social Connection and Isolation Panel [NHANES]    Frequency of Communication with Friends and Family: More than three times a week    Frequency of Social Gatherings with Friends and Family: More than three times a week    Attends Religious Services: Never    Database administrator or Organizations: No    Attends Banker Meetings: Never    Marital Status: Married    Tobacco Counseling Ready to quit: No Counseling given: Yes   Clinical Intake:  Pre-visit preparation completed: No  Activities of Daily Living    04/13/2023    3:14 PM  In your present state of health, do you have any difficulty performing the following activities:   Hearing? 0  Vision? 0  Difficulty concentrating or making decisions? 0  Walking or climbing stairs? 0  Dressing or bathing? 0  Doing errands, shopping? 0  Preparing Food and eating ? N  Using the Toilet? N  In the past six months, have you accidently leaked urine? Y  Comment Wears pads. Incontinent of urine  Do you have problems with loss of bowel control? N  Managing your Medications? N  Managing your Finances? N  Housekeeping or managing your Housekeeping? N    Patient Care Team: Shirline Frees, NP as PCP - General (Family Medicine) Rollene Rotunda, MD as PCP - Cardiology (Cardiology) Exie Parody, MD (Hematology and Oncology) [place], Hanley Seamen as Referring Physician (Physician Assistant) York Spaniel, MD (Inactive) as Consulting Physician (Neurology) Verner Chol, [facility] (Inactive) as Pharmacist (Pharmacist)  Indicate any recent Medical Services you may have received from other than [facility] providers in the past year (date may be approximate).     Assessment:   This is a routine wellness examination for [name].  Hearing/Vision screen Hearing Screening - Comments:: Denies hearing difficulties   Vision Screening - Comments:: Wears rx glasses - up to date with routine eye exams with  [facility]  Dietary issues and exercise activities discussed:     Goals Addressed               This Visit's Progress     No current goals (pt-stated)        I just want to get by.       Depression Screen    04/13/2023    3:12 PM 02/23/2022    3:05 PM 11/06/2021    3:25 PM 02/19/2021    2:45 PM 07/17/2015    2:43 PM  PHQ 2/9 Scores  PHQ -  2 Score 0 1 6 0 6  PHQ- 9 Score   23  27    Fall Risk    04/13/2023    3:15 PM 02/23/2022    3:02 PM 11/06/2021    3:26 PM 02/19/2021    2:45 PM 10/30/2020    1:05 PM  Fall Risk   Falls in the past year? 1 1 1  0 1  Number falls in past yr: 0 1 1 0 1  Injury with Fall? 0 0 0 0 0  Risk for fall due to : Impaired balance/gait History  of fall(s)  Impaired balance/gait   Follow up Falls prevention discussed Falls prevention discussed  Falls evaluation completed     MEDICARE RISK AT HOME:  Medicare Risk at Home - 04/13/23 1527     Any stairs in or around the home? Yes    If so, are there any without handrails? No    Home free of loose throw rugs in walkways, pet beds, electrical cords, etc? Yes    Adequate lighting in your home to reduce risk of falls? Yes    Life alert? No    Use of a cane, walker or w/c? Yes    Grab bars in the bathroom? Yes    Shower chair or bench in shower? Yes    Elevated toilet seat or a handicapped toilet? Yes             TIMED UP AND GO:  Was the test performed?  No    Cognitive Function:        04/13/2023    3:26 PM 04/13/2023    3:18 PM  6CIT Screen  What Year? -- --  What month? -- --  What time? --   Count back from 20 -- --  Months in reverse -- --  Repeat phrase -- --    Immunizations  There is no immunization history on file for this patient.  TDAP status: Due, Education has been provided regarding the importance of this vaccine. Advised may receive this vaccine at local pharmacy or Health Dept. Aware to provide a copy of the vaccination record if obtained from local pharmacy or Health Dept. Verbalized acceptance and understanding.  Flu Vaccine status: Up to date  Pneumococcal vaccine status: Declined,  Education has been provided regarding the importance of this vaccine but patient still declined. Advised may receive this vaccine at local pharmacy or Health Dept. Aware to provide a copy of the vaccination record if obtained from local pharmacy or Health Dept. Verbalized acceptance and understanding.   Covid-19 vaccine status: Declined, Education has been provided regarding the importance of this vaccine but patient still declined. Advised may receive this vaccine at local pharmacy or Health Dept.or vaccine clinic. Aware to provide a copy of the vaccination record  if obtained from local pharmacy or Health Dept. Verbalized acceptance and understanding.  Qualifies for Shingles Vaccine? Yes   Zostavax completed No   Shingrix Completed?: No.    Education has been provided regarding the importance of this vaccine. Patient has been advised to call insurance company to determine out of pocket expense if they have not yet received this vaccine. Advised may also receive vaccine at local pharmacy or Health Dept. Verbalized acceptance and understanding.  Screening Tests Health Maintenance  Topic Date Due   DTaP/Tdap/Td (1 - Tdap) Never done   PAP SMEAR-Modifier  Never done   FOOT EXAM  02/25/2023   Diabetic kidney evaluation - Urine ACR  04/14/2023 (  Originally 02/25/2023)   COVID-19 Vaccine (1 - 2023-24 season) 04/29/2023 (Originally 05/28/2022)   Zoster Vaccines- Shingrix (1 of 2) 05/26/2023 (Originally 02/26/2008)   Colonoscopy  02/29/2024 (Originally 02/26/2003)   Lung Cancer Screening  04/12/2024 (Originally 09/15/2010)   Pneumonia Vaccine 73+ Years old (1 of 2 - PCV) 04/12/2024 (Originally 02/26/1962)   MAMMOGRAM  04/12/2024 (Originally 02/26/2008)   DEXA SCAN  04/12/2024 (Originally 02/26/2023)   INFLUENZA VACCINE  04/28/2023   HEMOGLOBIN A1C  08/26/2023   OPHTHALMOLOGY EXAM  01/24/2024   Diabetic kidney evaluation - eGFR measurement  02/23/2024   Medicare Annual Wellness (AWV)  04/12/2024   Hepatitis C Screening  Completed   HIV Screening  Completed   HPV VACCINES  Aged Out    Health Maintenance  Health Maintenance Due  Topic Date Due   DTaP/Tdap/Td (1 - Tdap) Never done   PAP SMEAR-Modifier  Never done   FOOT EXAM  02/25/2023    Colorectal cancer screening: Referral to GI placed Deferred. Pt aware the office will call re: appt.  Mammogram status: Ordered Deferred. Pt provided with contact info and advised to call to schedule appt.   Bone Density status: Ordered Deferred. Pt provided with contact info and advised to call to schedule appt.  Lung  Cancer Screening: (Low Dose CT Chest recommended if Age 19-80 years, 20 pack-year currently smoking OR have quit w/in 15years.) does qualify.   Lung Cancer Screening Referral: Deferred  Additional Screening:  Hepatitis C Screening: does qualify; Completed 5.31/23  Vision Screening: Recommended annual ophthalmology exams for early detection of glaucoma and other disorders of the eye. Is the patient up to date with their annual eye exam?  Yes  Who is the provider or what is the name of the office in which the patient attends annual eye exams? [facility] If pt is not established with a provider, would they like to be referred to a provider to establish care? No .   Dental Screening: Recommended annual dental exams for proper oral hygiene  Diabetic Foot Exam: Diabetic Foot Exam: Overdue, Pt has been advised about the importance in completing this exam. Pt is scheduled for diabetic foot exam on Followed by PCP.  Community Resource Referral / Chronic Care Management:  CRR required this visit?  No   CCM required this visit?  No     Plan:     I have personally reviewed and noted the following in the patient's chart:   Medical and social history Use of alcohol, tobacco or illicit drugs  Current medications and supplements including opioid prescriptions. Patient is not currently taking opioid prescriptions. Functional ability and status Nutritional status Physical activity Advanced directives List of other physicians Hospitalizations, surgeries, and ER visits in previous 12 months Vitals Screenings to include cognitive, depression, and falls Referrals and appointments  In addition, I have reviewed and discussed with patient certain preventive protocols, quality metrics, and best practice recommendations. A written personalized care plan for preventive services as well as general preventive health recommendations were provided to patient.     Tillie Rung, LPN   [id number]/61/0960    After Visit Summary: (Mail) Due to this being a telephonic visit, the after visit summary with patients personalized plan was offered to patient via mail   Nurse Notes: Patient due Diabetic kidney evaluation- Urine ACR

## 2023-04-26 ENCOUNTER — Other Ambulatory Visit: Payer: Self-pay

## 2023-04-26 ENCOUNTER — Ambulatory Visit: Payer: Medicare Other | Attending: Surgical

## 2023-04-26 DIAGNOSIS — M25611 Stiffness of right shoulder, not elsewhere classified: Secondary | ICD-10-CM | POA: Insufficient documentation

## 2023-04-26 DIAGNOSIS — G8929 Other chronic pain: Secondary | ICD-10-CM | POA: Diagnosis not present

## 2023-04-26 DIAGNOSIS — M25511 Pain in right shoulder: Secondary | ICD-10-CM | POA: Insufficient documentation

## 2023-04-26 DIAGNOSIS — M6281 Muscle weakness (generalized): Secondary | ICD-10-CM | POA: Insufficient documentation

## 2023-04-26 NOTE — Therapy (Signed)
OUTPATIENT PHYSICAL THERAPY SHOULDER EVALUATION   Patient Name: [name] MRN: [id number] DOB:18-Dec-1955, 67 y.o., female Today's Date: 04/26/2023  END OF SESSION:  PT End of Session - 04/26/23 1353     Visit Number 1    Number of Visits 8    Date for PT Re-Evaluation 06/24/23    PT Start Time 1355    PT Stop Time 1441    PT Time Calculation (min) 46 min    Activity Tolerance Patient tolerated treatment well    Behavior During Therapy [facility] for tasks assessed/performed             Past Medical History:  Diagnosis Date   Acute MI, inferior wall, initial episode of care (HCC) 07/08/2012   Cath-07/08/11 -distal RCA stent DES (99%), LAD 20-30%. EF 50% inferior hypokinesis  (infarct)   Anxiety    Asthma    Coronary artery disease    Depression    Diabetes mellitus    Fibromyalgia    Leukocytosis    Monocytosis    Peripheral arterial disease (HCC) 01/23/2013   Tobacco use disorder 07/08/2012   Past Surgical History:  Procedure Laterality Date   ABDOMINAL AORTAGRAM N/A 02/07/2013   Procedure: ABDOMINAL Ronny Flurry;  Surgeon: Iran Ouch, MD;  Location: [facility] CATH LAB;  Service: Cardiovascular;  Laterality: N/A;   CARDIAC CATHETERIZATION     CORONARY STENT PLACEMENT     CYST REMOVAL TRUNK  1977   tailbone   LEFT HEART CATHETERIZATION WITH CORONARY ANGIOGRAM N/A 07/07/2012   Procedure: LEFT HEART CATHETERIZATION WITH CORONARY ANGIOGRAM;  Surgeon: Kathleene Hazel, MD;  Location: [facility] CATH LAB;  Service: Cardiovascular;  Laterality: N/A;   PERCUTANEOUS CORONARY STENT INTERVENTION (PCI-S)  07/07/2012   Procedure: PERCUTANEOUS CORONARY STENT INTERVENTION (PCI-S);  Surgeon: Kathleene Hazel, MD;  Location: [facility] CATH LAB;  Service: Cardiovascular;;   TONSILLECTOMY     Patient Active Problem List   Diagnosis Date Noted   Coronary artery disease involving native coronary artery of native heart without angina pectoris 12/23/2020   Type 2 diabetes mellitus with  complication, without long-term current use of insulin (HCC) 12/23/2020   Hyperlipidemia 01/31/2014   Pain in limb 12/26/2013   Disturbance of skin sensation 12/26/2013   Peripheral arterial disease (HCC) 01/23/2013   Leukocytosis    Monocytosis    Acute MI, inferior wall, initial episode of care (HCC) 07/08/2012   Tobacco use disorder 07/08/2012   Fibromyalgia 07/08/2012   Depression 07/08/2012   Diabetes mellitus type II, uncontrolled (HCC) 07/08/2012   REFERRING PROVIDER: Julieanne Cotton, PA-C   REFERRING DIAG: Acute pain of right shoulder   THERAPY DIAG:  Chronic right shoulder pain  Stiffness of right shoulder, not elsewhere classified  Muscle weakness (generalized)  Rationale for Evaluation and Treatment: Rehabilitation  ONSET DATE: "months ago"   SUBJECTIVE:  SUBJECTIVE STATEMENT: Patient reports that her right shoulder has been her for a few months now, but she is unsure how long it has been going on. She was playing tug of war with her dog when she sprained her wrist and injured her right arm. She was told that she had a torn rotator cuff in her right shoulder. She notes that ice helps her shoulder some, but she has to be careful because her skin is sensitive. She has tried a cortisone injection, but this did not help any.  Hand dominance: Right  PERTINENT HISTORY: History of a MI, type 2 diabetes, fibromyalgia, depression, current smoker, anxiety, and asthma  PAIN:  Are you having pain? Yes: NPRS scale: 10/10 Pain location: right shoulder and arm Pain description: "pinch" in the back of her shoulder going through to the front of her shoulder and down her triceps Aggravating factors: moving her arm Relieving factors: medication  PRECAUTIONS: Fall  RED FLAGS: None   WEIGHT BEARING  RESTRICTIONS: No  FALLS:  Has patient fallen in last 6 months? Yes. Number of falls 1; she turned around and lost her balance about 6 months ago  LIVING ENVIRONMENT: Lives with: lives with their spouse Lives in: House/apartment  OCCUPATION: Disabled  PLOF: Independent  PATIENT GOALS:improved shoulder mobility, clean her house, and reduced pain  NEXT MD VISIT: 05/16/23  OBJECTIVE:   DIAGNOSTIC FINDINGS: 01/12/23 right shoulder x-ray IMPRESSION: AC joint degenerative changes.  COGNITION: Overall cognitive status: Within functional limits for tasks assessed     SENSATION: Light touch: No significant deficits bilaterally Patient reports numbness in the 2nd and 3rd digits of her right hand and increased sensitivity in her right arm.   UPPER EXTREMITY ROM:   Active ROM Right eval Left eval  Shoulder flexion 132 134  Shoulder extension    Shoulder abduction 88/ 110 (PROM); painful  119  Shoulder adduction    Shoulder internal rotation To T9; painful  To T4  Shoulder external rotation To occiput;familiar pain radiating into wrist To C7; limited by pulling in right side of chest   Elbow flexion    Elbow extension    Wrist flexion    Wrist extension    Wrist ulnar deviation    Wrist radial deviation    Wrist pronation    Wrist supination    (Blank rows = not tested)  UPPER EXTREMITY MMT:  MMT Right eval Left eval  Shoulder flexion 4/5; triceps pain 4/5  Shoulder extension    Shoulder abduction  4+/5  Shoulder adduction    Shoulder internal rotation 4+/5; "tight"  4+/5  Shoulder external rotation 4/5 4+/5  Middle trapezius    Lower trapezius    Elbow flexion    Elbow extension    Wrist flexion    Wrist extension    Wrist ulnar deviation    Wrist radial deviation    Wrist pronation    Wrist supination    Grip strength (lbs)    (Blank rows = not tested)  SHOULDER SPECIAL TESTS: Rotator cuff assessment: Drop arm test: negative  PALPATION:  Global  tenderness to palpation throughout right shoulder with increased tenderness along triceps    TODAY'S TREATMENT:  DATE:    PATIENT EDUCATION: Education details: anatomy, healing, POC, prognosis, and goals for therapy Person educated: Patient Education method: Explanation Education comprehension: verbalized understanding  HOME EXERCISE PROGRAM:   ASSESSMENT:  CLINICAL IMPRESSION: Patient is a 67 y.o. female who was seen today for physical therapy evaluation and treatment for chronic right shoulder pain.  She presented with high pain severity and irritability with right shoulder active range of motion being the most aggravating to her familiar symptoms.  She exhibited reduced right shoulder active range of motion compared to the left shoulder with abduction, functional internal rotation, and functional external rotation being the most limited.  Right shoulder abduction manual muscle testing was unable to be tested as she was unable to achieve the necessary testing position to properly perform this assessment.  Recommend that she continue with skilled physical therapy to address her impairments to maximize her functional mobility.  OBJECTIVE IMPAIRMENTS: decreased activity tolerance, decreased ROM, decreased strength, hypomobility, impaired tone, impaired UE functional use, and pain.   ACTIVITY LIMITATIONS: carrying, lifting, bathing, dressing, reach over head, and caring for others  PARTICIPATION LIMITATIONS: meal prep, cleaning, laundry, driving, shopping, and community activity  PERSONAL FACTORS: Past/current experiences, Time since onset of injury/illness/exacerbation, and 3+ comorbidities: History of a MI, type 2 diabetes, fibromyalgia, depression, current smoker, anxiety, and asthma  are also affecting patient's functional outcome.   REHAB POTENTIAL:  Fair    CLINICAL DECISION MAKING: Evolving/moderate complexity  EVALUATION COMPLEXITY: Moderate   GOALS: Goals reviewed with patient? Yes  LONG TERM GOALS: Target date: 05/24/23  Patient will be independent with her HEP.  Baseline:  Goal status: INITIAL  2.  Patient will be able to demonstrate right shoulder abduction to 110 degrees or greater for improved function reaching overhead.  Baseline:  Goal status: INITIAL  3.  Patient will be able to complete her daily activities without her familiar pain exceeding 7/10.  Baseline:  Goal status: INITIAL  4.  Patient will report being able to wash her hair with her right upper extremity for improved independence.  Baseline:  Goal status: INITIAL  5.  Patient will report being able to clean her house without being limited by her familiar right shoulder symptoms.  Baseline:  Goal status: INITIAL  PLAN:  PT FREQUENCY: 2x/week  PT DURATION: 4 weeks  PLANNED INTERVENTIONS: Therapeutic exercises, Therapeutic activity, Neuromuscular re-education, Patient/Family education, Self Care, Joint mobilization, Electrical stimulation, Spinal mobilization, Cryotherapy, Moist heat, Taping, Vasopneumatic device, Manual therapy, and Re-evaluation  PLAN FOR NEXT SESSION: pulleys, isometrics, PROM, and modalities as needed   Granville Lewis, PT 04/26/2023, 5:52 PM

## 2023-04-28 ENCOUNTER — Ambulatory Visit: Payer: Medicare Other | Attending: Surgical | Admitting: Physical Therapy

## 2023-04-28 ENCOUNTER — Encounter: Payer: Self-pay | Admitting: Physical Therapy

## 2023-04-28 DIAGNOSIS — G8929 Other chronic pain: Secondary | ICD-10-CM | POA: Insufficient documentation

## 2023-04-28 DIAGNOSIS — M25511 Pain in right shoulder: Secondary | ICD-10-CM | POA: Diagnosis not present

## 2023-04-28 DIAGNOSIS — M25611 Stiffness of right shoulder, not elsewhere classified: Secondary | ICD-10-CM | POA: Insufficient documentation

## 2023-04-28 DIAGNOSIS — M6281 Muscle weakness (generalized): Secondary | ICD-10-CM | POA: Diagnosis not present

## 2023-04-28 NOTE — Therapy (Signed)
OUTPATIENT PHYSICAL THERAPY SHOULDER TREATMENT   Patient Name: [name] MRN: [id number] DOB:[birth year], 67 y.o., female Today's Date: 04/28/2023  END OF SESSION:  PT End of Session - 04/28/23 1604     Visit Number 2    Number of Visits 8    Date for PT Re-Evaluation 06/24/23    PT Start Time 1602    PT Stop Time 1645    PT Time Calculation (min) 43 min    Activity Tolerance Patient tolerated treatment well    Behavior During Therapy [facility] for tasks assessed/performed             Past Medical History:  Diagnosis Date   Acute MI, inferior wall, initial episode of care (HCC) 07/08/2012   Cath-07/08/11 -distal RCA stent DES (99%), LAD 20-30%. EF 50% inferior hypokinesis  (infarct)   Anxiety    Asthma    Coronary artery disease    Depression    Diabetes mellitus    Fibromyalgia    Leukocytosis    Monocytosis    Peripheral arterial disease (HCC) 01/23/2013   Tobacco use disorder 07/08/2012   Past Surgical History:  Procedure Laterality Date   ABDOMINAL AORTAGRAM N/A 02/07/2013   Procedure: ABDOMINAL Ronny Flurry;  Surgeon: Iran Ouch, MD;  Location: [facility] CATH LAB;  Service: Cardiovascular;  Laterality: N/A;   CARDIAC CATHETERIZATION     CORONARY STENT PLACEMENT     CYST REMOVAL TRUNK  1977   tailbone   LEFT HEART CATHETERIZATION WITH CORONARY ANGIOGRAM N/A 07/07/2012   Procedure: LEFT HEART CATHETERIZATION WITH CORONARY ANGIOGRAM;  Surgeon: Kathleene Hazel, MD;  Location: [facility] CATH LAB;  Service: Cardiovascular;  Laterality: N/A;   PERCUTANEOUS CORONARY STENT INTERVENTION (PCI-S)  07/07/2012   Procedure: PERCUTANEOUS CORONARY STENT INTERVENTION (PCI-S);  Surgeon: Kathleene Hazel, MD;  Location: [facility] CATH LAB;  Service: Cardiovascular;;   TONSILLECTOMY     Patient Active Problem List   Diagnosis Date Noted   Coronary artery disease involving native coronary artery of native heart without angina pectoris 12/23/2020   Type 2 diabetes mellitus with  complication, without long-term current use of insulin (HCC) 12/23/2020   Hyperlipidemia 01/31/2014   Pain in limb 12/26/2013   Disturbance of skin sensation 12/26/2013   Peripheral arterial disease (HCC) 01/23/2013   Leukocytosis    Monocytosis    Acute MI, inferior wall, initial episode of care (HCC) 07/08/2012   Tobacco use disorder 07/08/2012   Fibromyalgia 07/08/2012   Depression 07/08/2012   Diabetes mellitus type II, uncontrolled (HCC) 07/08/2012   REFERRING PROVIDER: Julieanne Cotton, PA-C   REFERRING DIAG: Acute pain of right shoulder   THERAPY DIAG:  Chronic right shoulder pain  Stiffness of right shoulder, not elsewhere classified  Rationale for Evaluation and Treatment: Rehabilitation  ONSET DATE: "months ago"   SUBJECTIVE:  SUBJECTIVE STATEMENT: Has been driving a lot since her husband can't and that hurts her shoulder. Does wake with pain occasionally.  Hand dominance: Right  PERTINENT HISTORY: History of a MI, type 2 diabetes, fibromyalgia, depression, current smoker, anxiety, and asthma  PAIN:  Are you having pain? Yes: NPRS scale: 6-7/10 Pain location: right shoulder and arm Pain description: "pinch" in the back of her shoulder going through to the front of her shoulder and down her triceps Aggravating factors: moving her arm Relieving factors: medication  PRECAUTIONS: Fall  RED FLAGS: None   WEIGHT BEARING RESTRICTIONS: No  FALLS:  Has patient fallen in last 6 months? Yes. Number of falls 1; she turned around and lost her balance about 6 months ago  LIVING ENVIRONMENT: Lives with: lives with their spouse Lives in: House/apartment  OCCUPATION: Disabled  PLOF: Independent  PATIENT GOALS:improved shoulder mobility, clean her house, and reduced pain  NEXT MD  VISIT: 05/16/23  OBJECTIVE:   DIAGNOSTIC FINDINGS: 01/12/23 right shoulder x-ray IMPRESSION: AC joint degenerative changes.  COGNITION: Overall cognitive status: Within functional limits for tasks assessed     SENSATION: Light touch: No significant deficits bilaterally Patient reports numbness in the 2nd and 3rd digits of her right hand and increased sensitivity in her right arm.   UPPER EXTREMITY ROM:   Active ROM Right eval Left eval  Shoulder flexion 132 134  Shoulder extension    Shoulder abduction 88/ 110 (PROM); painful  119  Shoulder adduction    Shoulder internal rotation To T9; painful  To T4  Shoulder external rotation To occiput;familiar pain radiating into wrist To C7; limited by pulling in right side of chest   Elbow flexion    Elbow extension    Wrist flexion    Wrist extension    Wrist ulnar deviation    Wrist radial deviation    Wrist pronation    Wrist supination    (Blank rows = not tested)  UPPER EXTREMITY MMT:  MMT Right eval Left eval  Shoulder flexion 4/5; triceps pain 4/5  Shoulder extension    Shoulder abduction  4+/5  Shoulder adduction    Shoulder internal rotation 4+/5; "tight"  4+/5  Shoulder external rotation 4/5 4+/5  Middle trapezius    Lower trapezius    Elbow flexion    Elbow extension    Wrist flexion    Wrist extension    Wrist ulnar deviation    Wrist radial deviation    Wrist pronation    Wrist supination    Grip strength (lbs)    (Blank rows = not tested)  SHOULDER SPECIAL TESTS: Rotator cuff assessment: Drop arm test: negative  PALPATION:  Global tenderness to palpation throughout right shoulder with increased tenderness along triceps    TODAY'S TREATMENT:  DATE: 04/28/23 EXERCISE LOG  Exercise Repetitions and Resistance Comments  Pulley X5 min   UE ranger  X5 min circles,  adduct/abduct   UBE 90 RPM x4 min (forward/backward) Fatigued in Upper trap  Isometrics Flex, ER, IR, abduct 5x5 sec 50% effort Anterior shoulder/ chest pain  Corner stretch 3x20 reps    Blank cell = exercise not performed today   Modalities  Date: 04/28/2023 Unattended Estim: Shoulder, IFC, 15 mins, Pain Cold Pack: Shoulder, 15 mins, Pain  PATIENT EDUCATION: Education details: anatomy, healing, POC, prognosis, and goals for therapy Person educated: Patient Education method: Explanation Education comprehension: verbalized understanding  HOME EXERCISE PROGRAM:  ASSESSMENT:  CLINICAL IMPRESSION: Patient presented in clinic with increased R shoulder pain primarily in posterior shoulder through tricep region. Patient guided through light ROM and isometric training with no complaints other than an anterior shoulder/chest pain with isometric flexion. Patient indicated any repetitive motions and abduction causes pain with household chores as well as driving. Normal modalities response noted following removal of the modalities.  OBJECTIVE IMPAIRMENTS: decreased activity tolerance, decreased ROM, decreased strength, hypomobility, impaired tone, impaired UE functional use, and pain.   ACTIVITY LIMITATIONS: carrying, lifting, bathing, dressing, reach over head, and caring for others  PARTICIPATION LIMITATIONS: meal prep, cleaning, laundry, driving, shopping, and community activity  PERSONAL FACTORS: Past/current experiences, Time since onset of injury/illness/exacerbation, and 3+ comorbidities: History of a MI, type 2 diabetes, fibromyalgia, depression, current smoker, anxiety, and asthma  are also affecting patient's functional outcome.   REHAB POTENTIAL: Fair    CLINICAL DECISION MAKING: Evolving/moderate complexity  EVALUATION COMPLEXITY: Moderate  GOALS: Goals reviewed with patient? Yes  LONG TERM GOALS: Target date: 05/24/23  Patient will be independent with her HEP.  Baseline:   Goal status: INITIAL  2.  Patient will be able to demonstrate right shoulder abduction to 110 degrees or greater for improved function reaching overhead.  Baseline:  Goal status: INITIAL  3.  Patient will be able to complete her daily activities without her familiar pain exceeding 7/10.  Baseline:  Goal status: INITIAL  4.  Patient will report being able to wash her hair with her right upper extremity for improved independence.  Baseline:  Goal status: INITIAL  5.  Patient will report being able to clean her house without being limited by her familiar right shoulder symptoms.  Baseline:  Goal status: INITIAL  PLAN:  PT FREQUENCY: 2x/week  PT DURATION: 4 weeks  PLANNED INTERVENTIONS: Therapeutic exercises, Therapeutic activity, Neuromuscular re-education, Patient/Family education, Self Care, Joint mobilization, Electrical stimulation, Spinal mobilization, Cryotherapy, Moist heat, Taping, Vasopneumatic device, Manual therapy, and Re-evaluation  PLAN FOR NEXT SESSION: pulleys, isometrics, PROM, and modalities as needed  Marvell Fuller, PTA 04/28/2023, 4:56 PM

## 2023-05-03 ENCOUNTER — Encounter: Payer: Self-pay | Admitting: Physical Therapy

## 2023-05-03 ENCOUNTER — Ambulatory Visit: Payer: Medicare Other | Admitting: Physical Therapy

## 2023-05-03 DIAGNOSIS — G8929 Other chronic pain: Secondary | ICD-10-CM | POA: Diagnosis not present

## 2023-05-03 DIAGNOSIS — M25611 Stiffness of right shoulder, not elsewhere classified: Secondary | ICD-10-CM

## 2023-05-03 DIAGNOSIS — M25511 Pain in right shoulder: Secondary | ICD-10-CM | POA: Diagnosis not present

## 2023-05-03 DIAGNOSIS — M6281 Muscle weakness (generalized): Secondary | ICD-10-CM | POA: Diagnosis not present

## 2023-05-03 NOTE — Therapy (Signed)
OUTPATIENT PHYSICAL THERAPY SHOULDER TREATMENT   Patient Name: [name] MRN: [id number] DOB:[birth year], 67 y.o., female Today's Date: 05/03/2023  END OF SESSION:  PT End of Session - 05/03/23 1432     Visit Number 3    Number of Visits 8    Date for PT Re-Evaluation 06/24/23    PT Start Time 1432    PT Stop Time 1515    PT Time Calculation (min) 43 min    Activity Tolerance Patient tolerated treatment well    Behavior During Therapy [facility] for tasks assessed/performed            Past Medical History:  Diagnosis Date   Acute MI, inferior wall, initial episode of care (HCC) 07/08/2012   Cath-07/08/11 -distal RCA stent DES (99%), LAD 20-30%. EF 50% inferior hypokinesis  (infarct)   Anxiety    Asthma    Coronary artery disease    Depression    Diabetes mellitus    Fibromyalgia    Leukocytosis    Monocytosis    Peripheral arterial disease (HCC) 01/23/2013   Tobacco use disorder 07/08/2012   Past Surgical History:  Procedure Laterality Date   ABDOMINAL AORTAGRAM N/A 02/07/2013   Procedure: ABDOMINAL Ronny Flurry;  Surgeon: Iran Ouch, MD;  Location: [facility] CATH LAB;  Service: Cardiovascular;  Laterality: N/A;   CARDIAC CATHETERIZATION     CORONARY STENT PLACEMENT     CYST REMOVAL TRUNK  1977   tailbone   LEFT HEART CATHETERIZATION WITH CORONARY ANGIOGRAM N/A 07/07/2012   Procedure: LEFT HEART CATHETERIZATION WITH CORONARY ANGIOGRAM;  Surgeon: Kathleene Hazel, MD;  Location: [facility] CATH LAB;  Service: Cardiovascular;  Laterality: N/A;   PERCUTANEOUS CORONARY STENT INTERVENTION (PCI-S)  07/07/2012   Procedure: PERCUTANEOUS CORONARY STENT INTERVENTION (PCI-S);  Surgeon: Kathleene Hazel, MD;  Location: [facility] CATH LAB;  Service: Cardiovascular;;   TONSILLECTOMY     Patient Active Problem List   Diagnosis Date Noted   Coronary artery disease involving native coronary artery of native heart without angina pectoris 12/23/2020   Type 2 diabetes mellitus with  complication, without long-term current use of insulin (HCC) 12/23/2020   Hyperlipidemia 01/31/2014   Pain in limb 12/26/2013   Disturbance of skin sensation 12/26/2013   Peripheral arterial disease (HCC) 01/23/2013   Leukocytosis    Monocytosis    Acute MI, inferior wall, initial episode of care (HCC) 07/08/2012   Tobacco use disorder 07/08/2012   Fibromyalgia 07/08/2012   Depression 07/08/2012   Diabetes mellitus type II, uncontrolled (HCC) 07/08/2012   REFERRING PROVIDER: Julieanne Cotton, PA-C   REFERRING DIAG: Acute pain of right shoulder   THERAPY DIAG:  Chronic right shoulder pain  Stiffness of right shoulder, not elsewhere classified  Rationale for Evaluation and Treatment: Rehabilitation  ONSET DATE: "months ago"   SUBJECTIVE:  SUBJECTIVE STATEMENT: Did fairly well after last treatment where it was fairly easy. Hand dominance: Right  PERTINENT HISTORY: History of a MI, type 2 diabetes, fibromyalgia, depression, current smoker, anxiety, and asthma  PAIN:  Are you having pain? Yes: NPRS scale: no numerical rating provided/10 Pain location: right shoulder and arm Pain description: "pinch" in the back of her shoulder going through to the front of her shoulder and down her triceps Aggravating factors: moving her arm Relieving factors: medication  PRECAUTIONS: Fall  RED FLAGS: None   WEIGHT BEARING RESTRICTIONS: No  FALLS:  Has patient fallen in last 6 months? Yes. Number of falls 1; she turned around and lost her balance about 6 months ago  LIVING ENVIRONMENT: Lives with: lives with their spouse Lives in: House/apartment  OCCUPATION: Disabled  PLOF: Independent  PATIENT GOALS:improved shoulder mobility, clean her house, and reduced pain  NEXT MD VISIT:  05/16/23  OBJECTIVE:   DIAGNOSTIC FINDINGS: 01/12/23 right shoulder x-ray IMPRESSION: AC joint degenerative changes.  COGNITION: Overall cognitive status: Within functional limits for tasks assessed     SENSATION: Light touch: No significant deficits bilaterally Patient reports numbness in the 2nd and 3rd digits of her right hand and increased sensitivity in her right arm.   UPPER EXTREMITY ROM:   Active ROM Right eval Left eval  Shoulder flexion 132 134  Shoulder extension    Shoulder abduction 88/ 110 (PROM); painful  119  Shoulder adduction    Shoulder internal rotation To T9; painful  To T4  Shoulder external rotation To occiput;familiar pain radiating into wrist To C7; limited by pulling in right side of chest   Elbow flexion    Elbow extension    Wrist flexion    Wrist extension    Wrist ulnar deviation    Wrist radial deviation    Wrist pronation    Wrist supination    (Blank rows = not tested)  UPPER EXTREMITY MMT:  MMT Right eval Left eval  Shoulder flexion 4/5; triceps pain 4/5  Shoulder extension    Shoulder abduction  4+/5  Shoulder adduction    Shoulder internal rotation 4+/5; "tight"  4+/5  Shoulder external rotation 4/5 4+/5  Middle trapezius    Lower trapezius    Elbow flexion    Elbow extension    Wrist flexion    Wrist extension    Wrist ulnar deviation    Wrist radial deviation    Wrist pronation    Wrist supination    Grip strength (lbs)    (Blank rows = not tested)  SHOULDER SPECIAL TESTS: Rotator cuff assessment: Drop arm test: negative  PALPATION:  Global tenderness to palpation throughout right shoulder with increased tenderness along triceps    TODAY'S TREATMENT:  DATE: 05/03/23 EXERCISE LOG  Exercise Repetitions and Resistance Comments  Pulley X5 min Pain along lateral forearm  UE ranger  X5  min circles, adduct/abduct   UBE 90 RPM x6 min (forward/backward) Pain along lateral forearm  Resisted row, ext X20 reps yellow theraband   Horizontal abduction Yellow theraband x10 reps   Resisted B ER Yellow theraband x15 reps    Blank cell = exercise not performed today   Modalities  Date: 05/03/2023 Unattended Estim: Shoulder, IFC, 10 mins, Pain Cold Pack: Shoulder, 10 mins, Pain  PATIENT EDUCATION: Education details: anatomy, healing, POC, prognosis, and goals for therapy Person educated: Patient Education method: Explanation Education comprehension: verbalized understanding  HOME EXERCISE PROGRAM:  ASSESSMENT:  CLINICAL IMPRESSION: Patient presented in clinic with reports of mild R shoulder pain and denied having much pain following last session. Patient able to tolerate therex with intermittent reports of lateral forearm pain. All therex modified as needed based on symptoms. Normal stimulation response noted following removal of the modality.  OBJECTIVE IMPAIRMENTS: decreased activity tolerance, decreased ROM, decreased strength, hypomobility, impaired tone, impaired UE functional use, and pain.   ACTIVITY LIMITATIONS: carrying, lifting, bathing, dressing, reach over head, and caring for others  PARTICIPATION LIMITATIONS: meal prep, cleaning, laundry, driving, shopping, and community activity  PERSONAL FACTORS: Past/current experiences, Time since onset of injury/illness/exacerbation, and 3+ comorbidities: History of a MI, type 2 diabetes, fibromyalgia, depression, current smoker, anxiety, and asthma  are also affecting patient's functional outcome.   REHAB POTENTIAL: Fair    CLINICAL DECISION MAKING: Evolving/moderate complexity  EVALUATION COMPLEXITY: Moderate  GOALS: Goals reviewed with patient? Yes  LONG TERM GOALS: Target date: 05/24/23  Patient will be independent with her HEP.  Baseline:  Goal status: INITIAL  2.  Patient will be able to demonstrate right  shoulder abduction to 110 degrees or greater for improved function reaching overhead.  Baseline:  Goal status: INITIAL  3.  Patient will be able to complete her daily activities without her familiar pain exceeding 7/10.  Baseline:  Goal status: INITIAL  4.  Patient will report being able to wash her hair with her right upper extremity for improved independence.  Baseline:  Goal status: INITIAL  5.  Patient will report being able to clean her house without being limited by her familiar right shoulder symptoms.  Baseline:  Goal status: INITIAL  PLAN:  PT FREQUENCY: 2x/week  PT DURATION: 4 weeks  PLANNED INTERVENTIONS: Therapeutic exercises, Therapeutic activity, Neuromuscular re-education, Patient/Family education, Self Care, Joint mobilization, Electrical stimulation, Spinal mobilization, Cryotherapy, Moist heat, Taping, Vasopneumatic device, Manual therapy, and Re-evaluation  PLAN FOR NEXT SESSION: pulleys, isometrics, PROM, and modalities as needed  Marvell Fuller, PTA 05/03/2023, 4:48 PM

## 2023-05-04 ENCOUNTER — Other Ambulatory Visit: Payer: Self-pay | Admitting: Adult Health

## 2023-05-04 DIAGNOSIS — M5412 Radiculopathy, cervical region: Secondary | ICD-10-CM

## 2023-05-04 NOTE — Telephone Encounter (Signed)
Okay for refill?  

## 2023-05-05 ENCOUNTER — Ambulatory Visit: Payer: Medicare Other

## 2023-05-05 DIAGNOSIS — G8929 Other chronic pain: Secondary | ICD-10-CM

## 2023-05-05 DIAGNOSIS — M25611 Stiffness of right shoulder, not elsewhere classified: Secondary | ICD-10-CM

## 2023-05-05 DIAGNOSIS — M6281 Muscle weakness (generalized): Secondary | ICD-10-CM | POA: Diagnosis not present

## 2023-05-05 DIAGNOSIS — M25511 Pain in right shoulder: Secondary | ICD-10-CM | POA: Diagnosis not present

## 2023-05-05 NOTE — Therapy (Signed)
OUTPATIENT PHYSICAL THERAPY SHOULDER TREATMENT   Patient Name: [name] MRN: [id number] DOB:1956/03/18, 67 y.o., female Today's Date: 05/05/2023  END OF SESSION:  PT End of Session - 05/05/23 1432     Visit Number 4    Number of Visits 8    Date for PT Re-Evaluation 06/24/23    PT Start Time 1427    PT Stop Time 1506    PT Time Calculation (min) 39 min    Activity Tolerance Patient tolerated treatment well    Behavior During Therapy [facility] for tasks assessed/performed             Past Medical History:  Diagnosis Date   Acute MI, inferior wall, initial episode of care (HCC) 07/08/2012   Cath-07/08/11 -distal RCA stent DES (99%), LAD 20-30%. EF 50% inferior hypokinesis  (infarct)   Anxiety    Asthma    Coronary artery disease    Depression    Diabetes mellitus    Fibromyalgia    Leukocytosis    Monocytosis    Peripheral arterial disease (HCC) 01/23/2013   Tobacco use disorder 07/08/2012   Past Surgical History:  Procedure Laterality Date   ABDOMINAL AORTAGRAM N/A 02/07/2013   Procedure: ABDOMINAL Ronny Flurry;  Surgeon: Iran Ouch, MD;  Location: [facility] CATH LAB;  Service: Cardiovascular;  Laterality: N/A;   CARDIAC CATHETERIZATION     CORONARY STENT PLACEMENT     CYST REMOVAL TRUNK  1977   tailbone   LEFT HEART CATHETERIZATION WITH CORONARY ANGIOGRAM N/A 07/07/2012   Procedure: LEFT HEART CATHETERIZATION WITH CORONARY ANGIOGRAM;  Surgeon: Kathleene Hazel, MD;  Location: [facility] CATH LAB;  Service: Cardiovascular;  Laterality: N/A;   PERCUTANEOUS CORONARY STENT INTERVENTION (PCI-S)  07/07/2012   Procedure: PERCUTANEOUS CORONARY STENT INTERVENTION (PCI-S);  Surgeon: Kathleene Hazel, MD;  Location: [facility] CATH LAB;  Service: Cardiovascular;;   TONSILLECTOMY     Patient Active Problem List   Diagnosis Date Noted   Coronary artery disease involving native coronary artery of native heart without angina pectoris 12/23/2020   Type 2 diabetes mellitus with  complication, without long-term current use of insulin (HCC) 12/23/2020   Hyperlipidemia 01/31/2014   Pain in limb 12/26/2013   Disturbance of skin sensation 12/26/2013   Peripheral arterial disease (HCC) 01/23/2013   Leukocytosis    Monocytosis    Acute MI, inferior wall, initial episode of care (HCC) 07/08/2012   Tobacco use disorder 07/08/2012   Fibromyalgia 07/08/2012   Depression 07/08/2012   Diabetes mellitus type II, uncontrolled (HCC) 07/08/2012   REFERRING PROVIDER: Julieanne Cotton, PA-C   REFERRING DIAG: Acute pain of right shoulder   THERAPY DIAG:  Chronic right shoulder pain  Stiffness of right shoulder, not elsewhere classified  Muscle weakness (generalized)  Rationale for Evaluation and Treatment: Rehabilitation  ONSET DATE: "months ago"   SUBJECTIVE:  SUBJECTIVE STATEMENT: Patient reports that she feels feels the sore the day after her last appointment. However, it is getting a little better.  Hand dominance: Right  PERTINENT HISTORY: History of a MI, type 2 diabetes, fibromyalgia, depression, current smoker, anxiety, and asthma  PAIN:  Are you having pain? Yes: NPRS scale: 6-7/10 Pain location: right shoulder and arm Pain description: "pinch" in the back of her shoulder going through to the front of her shoulder and down her triceps Aggravating factors: moving her arm Relieving factors: medication  PRECAUTIONS: Fall  RED FLAGS: None   WEIGHT BEARING RESTRICTIONS: No  FALLS:  Has patient fallen in last 6 months? Yes. Number of falls 1; she turned around and lost her balance about 6 months ago  LIVING ENVIRONMENT: Lives with: lives with their spouse Lives in: House/apartment  OCCUPATION: Disabled  PLOF: Independent  PATIENT GOALS:improved shoulder mobility,  clean her house, and reduced pain  NEXT MD VISIT: 05/16/23  OBJECTIVE:   DIAGNOSTIC FINDINGS: 01/12/23 right shoulder x-ray IMPRESSION: AC joint degenerative changes.  COGNITION: Overall cognitive status: Within functional limits for tasks assessed     SENSATION: Light touch: No significant deficits bilaterally Patient reports numbness in the 2nd and 3rd digits of her right hand and increased sensitivity in her right arm.   UPPER EXTREMITY ROM:   Active ROM Right eval Left eval  Shoulder flexion 132 134  Shoulder extension    Shoulder abduction 88/ 110 (PROM); painful  119  Shoulder adduction    Shoulder internal rotation To T9; painful  To T4  Shoulder external rotation To occiput;familiar pain radiating into wrist To C7; limited by pulling in right side of chest   Elbow flexion    Elbow extension    Wrist flexion    Wrist extension    Wrist ulnar deviation    Wrist radial deviation    Wrist pronation    Wrist supination    (Blank rows = not tested)  UPPER EXTREMITY MMT:  MMT Right eval Left eval  Shoulder flexion 4/5; triceps pain 4/5  Shoulder extension    Shoulder abduction  4+/5  Shoulder adduction    Shoulder internal rotation 4+/5; "tight"  4+/5  Shoulder external rotation 4/5 4+/5  Middle trapezius    Lower trapezius    Elbow flexion    Elbow extension    Wrist flexion    Wrist extension    Wrist ulnar deviation    Wrist radial deviation    Wrist pronation    Wrist supination    Grip strength (lbs)    (Blank rows = not tested)  SHOULDER SPECIAL TESTS: Rotator cuff assessment: Drop arm test: negative  PALPATION:  Global tenderness to palpation throughout right shoulder with increased tenderness along triceps    TODAY'S TREATMENT:  DATE:                                   05/05/23 EXERCISE LOG  Exercise  Repetitions and Resistance Comments  UBE  X6 minutes @ 90 RPM  Half forward and half backward  Resisted row Red t-band x 2 minutes    Resisted pull down Red t-band x 2 minutes    Isometric abduction  10 reps w/ 5 second hold   Bilateral ER  Yellow t-band x 15 reps     Blank cell = exercise not performed today  Modalities: no redness or adverse reaction to today's modalities  Date:  Vaso: Shoulder, 34 degrees; low pressure, 15 mins, Pain   05/03/23 EXERCISE LOG  Exercise Repetitions and Resistance Comments  Pulley X5 min Pain along lateral forearm  UE ranger  X5 min circles, adduct/abduct   UBE 90 RPM x6 min (forward/backward) Pain along lateral forearm  Resisted row, ext X20 reps yellow theraband   Horizontal abduction Yellow theraband x10 reps   Resisted B ER Yellow theraband x15 reps    Blank cell = exercise not performed today   Modalities  Date: 05/03/2023 Unattended Estim: Shoulder, IFC, 10 mins, Pain Cold Pack: Shoulder, 10 mins, Pain  PATIENT EDUCATION: Education details: ice vs heat, safety Person educated: Patient Education method: Explanation Education comprehension: verbalized understanding  HOME EXERCISE PROGRAM:  ASSESSMENT:  CLINICAL IMPRESSION: Patient was attempted to be introduced to isometric right shoulder abduction for improved rotator cuff engagement. However, she began to experience increased shoulder pain with this intervention which resulted in this intervention being held at this time. Fatigue was her primary limitation with today's interventions. She reported that her shoulder felt better upon the conclusion of treatment. She continues to require skilled physical therapy to address her remaining impairments to maximize her functional mobility.   OBJECTIVE IMPAIRMENTS: decreased activity tolerance, decreased ROM, decreased strength, hypomobility, impaired tone, impaired UE functional use, and pain.   ACTIVITY LIMITATIONS: carrying, lifting, bathing,  dressing, reach over head, and caring for others  PARTICIPATION LIMITATIONS: meal prep, cleaning, laundry, driving, shopping, and community activity  PERSONAL FACTORS: Past/current experiences, Time since onset of injury/illness/exacerbation, and 3+ comorbidities: History of a MI, type 2 diabetes, fibromyalgia, depression, current smoker, anxiety, and asthma  are also affecting patient's functional outcome.   REHAB POTENTIAL: Fair    CLINICAL DECISION MAKING: Evolving/moderate complexity  EVALUATION COMPLEXITY: Moderate  GOALS: Goals reviewed with patient? Yes  LONG TERM GOALS: Target date: 05/24/23  Patient will be independent with her HEP.  Baseline:  Goal status: INITIAL  2.  Patient will be able to demonstrate right shoulder abduction to 110 degrees or greater for improved function reaching overhead.  Baseline:  Goal status: INITIAL  3.  Patient will be able to complete her daily activities without her familiar pain exceeding 7/10.  Baseline:  Goal status: INITIAL  4.  Patient will report being able to wash her hair with her right upper extremity for improved independence.  Baseline:  Goal status: INITIAL  5.  Patient will report being able to clean her house without being limited by her familiar right shoulder symptoms.  Baseline:  Goal status: INITIAL  PLAN:  PT FREQUENCY: 2x/week  PT DURATION: 4 weeks  PLANNED INTERVENTIONS: Therapeutic exercises, Therapeutic activity, Neuromuscular re-education, Patient/Family education, Self Care, Joint mobilization, Electrical stimulation, Spinal mobilization, Cryotherapy, Moist heat, Taping, Vasopneumatic device, Manual therapy, and Re-evaluation  PLAN FOR NEXT SESSION: pulleys, isometrics, PROM, and modalities as needed  Granville Lewis, PT 05/05/2023, 3:47 PM

## 2023-05-10 ENCOUNTER — Encounter: Payer: Self-pay | Admitting: Physical Therapy

## 2023-05-10 ENCOUNTER — Ambulatory Visit: Payer: Medicare Other | Admitting: Physical Therapy

## 2023-05-10 DIAGNOSIS — M25611 Stiffness of right shoulder, not elsewhere classified: Secondary | ICD-10-CM | POA: Diagnosis not present

## 2023-05-10 DIAGNOSIS — G8929 Other chronic pain: Secondary | ICD-10-CM | POA: Diagnosis not present

## 2023-05-10 DIAGNOSIS — M25511 Pain in right shoulder: Secondary | ICD-10-CM | POA: Diagnosis not present

## 2023-05-10 DIAGNOSIS — M6281 Muscle weakness (generalized): Secondary | ICD-10-CM | POA: Diagnosis not present

## 2023-05-10 NOTE — Therapy (Signed)
OUTPATIENT PHYSICAL THERAPY SHOULDER TREATMENT   Patient Name: [name] MRN: [id number] DOB:April 21, 1956, 67 y.o., female Today's Date: 05/10/2023  END OF SESSION:  PT End of Session - 05/10/23 1435     Visit Number 5    Number of Visits 8    Date for PT Re-Evaluation 06/24/23    PT Start Time 1435    PT Stop Time 1518    PT Time Calculation (min) 43 min    Activity Tolerance Patient tolerated treatment well    Behavior During Therapy [facility] for tasks assessed/performed            Past Medical History:  Diagnosis Date   Acute MI, inferior wall, initial episode of care (HCC) 07/08/2012   Cath-07/08/11 -distal RCA stent DES (99%), LAD 20-30%. EF 50% inferior hypokinesis  (infarct)   Anxiety    Asthma    Coronary artery disease    Depression    Diabetes mellitus    Fibromyalgia    Leukocytosis    Monocytosis    Peripheral arterial disease (HCC) 01/23/2013   Tobacco use disorder 07/08/2012   Past Surgical History:  Procedure Laterality Date   ABDOMINAL AORTAGRAM N/A 02/07/2013   Procedure: ABDOMINAL Ronny Flurry;  Surgeon: Iran Ouch, MD;  Location: [facility] CATH LAB;  Service: Cardiovascular;  Laterality: N/A;   CARDIAC CATHETERIZATION     CORONARY STENT PLACEMENT     CYST REMOVAL TRUNK  1977   tailbone   LEFT HEART CATHETERIZATION WITH CORONARY ANGIOGRAM N/A 07/07/2012   Procedure: LEFT HEART CATHETERIZATION WITH CORONARY ANGIOGRAM;  Surgeon: Kathleene Hazel, MD;  Location: [facility] CATH LAB;  Service: Cardiovascular;  Laterality: N/A;   PERCUTANEOUS CORONARY STENT INTERVENTION (PCI-S)  07/07/2012   Procedure: PERCUTANEOUS CORONARY STENT INTERVENTION (PCI-S);  Surgeon: Kathleene Hazel, MD;  Location: [facility] CATH LAB;  Service: Cardiovascular;;   TONSILLECTOMY     Patient Active Problem List   Diagnosis Date Noted   Coronary artery disease involving native coronary artery of native heart without angina pectoris 12/23/2020   Type 2 diabetes mellitus with  complication, without long-term current use of insulin (HCC) 12/23/2020   Hyperlipidemia 01/31/2014   Pain in limb 12/26/2013   Disturbance of skin sensation 12/26/2013   Peripheral arterial disease (HCC) 01/23/2013   Leukocytosis    Monocytosis    Acute MI, inferior wall, initial episode of care (HCC) 07/08/2012   Tobacco use disorder 07/08/2012   Fibromyalgia 07/08/2012   Depression 07/08/2012   Diabetes mellitus type II, uncontrolled (HCC) 07/08/2012   REFERRING PROVIDER: Julieanne Cotton, PA-C   REFERRING DIAG: Acute pain of right shoulder   THERAPY DIAG:  Chronic right shoulder pain  Stiffness of right shoulder, not elsewhere classified  Rationale for Evaluation and Treatment: Rehabilitation  ONSET DATE: "months ago"   SUBJECTIVE:  SUBJECTIVE STATEMENT: Has deep ache in wrist especially today but also in posterior R shoulder.  Hand dominance: Right  PERTINENT HISTORY: History of a MI, type 2 diabetes, fibromyalgia, depression, current smoker, anxiety, and asthma  PAIN:  Are you having pain? Yes: NPRS scale: "not bad"/10 Pain location: right shoulder and wrist Pain description: deep ache Aggravating factors: moving her arm Relieving factors: medication  PRECAUTIONS: Fall  WEIGHT BEARING RESTRICTIONS: No  FALLS:  Has patient fallen in last 6 months? Yes. Number of falls 1; she turned around and lost her balance about 6 months ago  PATIENT GOALS:improved shoulder mobility, clean her house, and reduced pain  NEXT MD VISIT: 05/16/23  OBJECTIVE:   DIAGNOSTIC FINDINGS: 01/12/23 right shoulder x-ray IMPRESSION: AC joint degenerative changes.  COGNITION: Overall cognitive status: Within functional limits for tasks assessed     SENSATION: Light touch: No significant deficits  bilaterally Patient reports numbness in the 2nd and 3rd digits of her right hand and increased sensitivity in her right arm.   UPPER EXTREMITY ROM:   Active ROM Right eval Left eval  Shoulder flexion 132 134  Shoulder extension    Shoulder abduction 88/ 110 (PROM); painful  119  Shoulder adduction    Shoulder internal rotation To T9; painful  To T4  Shoulder external rotation To occiput;familiar pain radiating into wrist To C7; limited by pulling in right side of chest   Elbow flexion    Elbow extension    Wrist flexion    Wrist extension    Wrist ulnar deviation    Wrist radial deviation    Wrist pronation    Wrist supination    (Blank rows = not tested)  UPPER EXTREMITY MMT:  MMT Right eval Left eval  Shoulder flexion 4/5; triceps pain 4/5  Shoulder extension    Shoulder abduction  4+/5  Shoulder adduction    Shoulder internal rotation 4+/5; "tight"  4+/5  Shoulder external rotation 4/5 4+/5  Middle trapezius    Lower trapezius    Elbow flexion    Elbow extension    Wrist flexion    Wrist extension    Wrist ulnar deviation    Wrist radial deviation    Wrist pronation    Wrist supination    Grip strength (lbs)    (Blank rows = not tested)  SHOULDER SPECIAL TESTS: Rotator cuff assessment: Drop arm test: negative  PALPATION:  Global tenderness to palpation throughout right shoulder with increased tenderness along triceps    TODAY'S TREATMENT:                                                                                                                                         DATE: 05/10/23 EXERCISE LOG  Exercise Repetitions and Resistance Comments  UBE  X4 minutes @ 120 RPM  Half forward and half backward  Pulley X5 min   Resisted row Yellow  t-band x 2 minutes    Resisted pull down Yellow t-band x 2 minutes    Horizontal abduction Yellow theraband x20 reps   Resisted ER/IR Yellow theraband x15 reps each   Shoulder flexion AROM x15 reps    Blank cell =  exercise not performed today  Modalities: no redness or adverse reaction to today's modalities  Date: 05/10/23 Vaso: Shoulder, 34 degrees; low pressure, 15 mins, Pain  PATIENT EDUCATION: Education details: ice vs heat, safety Person educated: Patient Education method: Explanation Education comprehension: verbalized understanding  HOME EXERCISE PROGRAM:  ASSESSMENT:  CLINICAL IMPRESSION: Patient presented in clinic with greater R wrist pain reported than shoulder pain. Patient reports that she would possibly be limited by R wrist pain and all therex was attempted and symptoms assessed accordingly. Patient has a preference of using vasopneumatic device by end of session with normal response.  OBJECTIVE IMPAIRMENTS: decreased activity tolerance, decreased ROM, decreased strength, hypomobility, impaired tone, impaired UE functional use, and pain.   ACTIVITY LIMITATIONS: carrying, lifting, bathing, dressing, reach over head, and caring for others  PARTICIPATION LIMITATIONS: meal prep, cleaning, laundry, driving, shopping, and community activity  PERSONAL FACTORS: Past/current experiences, Time since onset of injury/illness/exacerbation, and 3+ comorbidities: History of a MI, type 2 diabetes, fibromyalgia, depression, current smoker, anxiety, and asthma  are also affecting patient's functional outcome.   REHAB POTENTIAL: Fair    CLINICAL DECISION MAKING: Evolving/moderate complexity  EVALUATION COMPLEXITY: Moderate  GOALS: Goals reviewed with patient? Yes  LONG TERM GOALS: Target date: 05/24/23  Patient will be independent with her HEP.  Baseline:  Goal status: INITIAL  2.  Patient will be able to demonstrate right shoulder abduction to 110 degrees or greater for improved function reaching overhead.  Baseline:  Goal status: INITIAL  3.  Patient will be able to complete her daily activities without her familiar pain exceeding 7/10.  Baseline:  Goal status: INITIAL  4.   Patient will report being able to wash her hair with her right upper extremity for improved independence.  Baseline:  Goal status: INITIAL  5.  Patient will report being able to clean her house without being limited by her familiar right shoulder symptoms.  Baseline:  Goal status: INITIAL  PLAN:  PT FREQUENCY: 2x/week  PT DURATION: 4 weeks  PLANNED INTERVENTIONS: Therapeutic exercises, Therapeutic activity, Neuromuscular re-education, Patient/Family education, Self Care, Joint mobilization, Electrical stimulation, Spinal mobilization, Cryotherapy, Moist heat, Taping, Vasopneumatic device, Manual therapy, and Re-evaluation  PLAN FOR NEXT SESSION: pulleys, isometrics, PROM, and modalities as needed  Marvell Fuller, PTA 05/10/2023, 3:24 PM

## 2023-05-12 ENCOUNTER — Ambulatory Visit: Payer: Medicare Other | Admitting: Physical Therapy

## 2023-05-12 ENCOUNTER — Encounter: Payer: Self-pay | Admitting: Physical Therapy

## 2023-05-12 DIAGNOSIS — G8929 Other chronic pain: Secondary | ICD-10-CM | POA: Diagnosis not present

## 2023-05-12 DIAGNOSIS — M25511 Pain in right shoulder: Secondary | ICD-10-CM | POA: Diagnosis not present

## 2023-05-12 DIAGNOSIS — M6281 Muscle weakness (generalized): Secondary | ICD-10-CM | POA: Diagnosis not present

## 2023-05-12 DIAGNOSIS — M25611 Stiffness of right shoulder, not elsewhere classified: Secondary | ICD-10-CM | POA: Diagnosis not present

## 2023-05-12 NOTE — Therapy (Addendum)
OUTPATIENT PHYSICAL THERAPY SHOULDER TREATMENT   Patient Name: [name] MRN: [id number] DOB:24-Dec-1955, 67 y.o., female Today's Date: 05/12/2023  END OF SESSION:  PT End of Session - 05/12/23 1434     Visit Number 6    Number of Visits 8    Date for PT Re-Evaluation 06/24/23    PT Start Time 1434    PT Stop Time 1515    PT Time Calculation (min) 41 min    Activity Tolerance Patient tolerated treatment well    Behavior During Therapy [facility] for tasks assessed/performed            Past Medical History:  Diagnosis Date   Acute MI, inferior wall, initial episode of care (HCC) 07/08/2012   Cath-07/08/11 -distal RCA stent DES (99%), LAD 20-30%. EF 50% inferior hypokinesis  (infarct)   Anxiety    Asthma    Coronary artery disease    Depression    Diabetes mellitus    Fibromyalgia    Leukocytosis    Monocytosis    Peripheral arterial disease (HCC) 01/23/2013   Tobacco use disorder 07/08/2012   Past Surgical History:  Procedure Laterality Date   ABDOMINAL AORTAGRAM N/A 02/07/2013   Procedure: ABDOMINAL Ronny Flurry;  Surgeon: Iran Ouch, MD;  Location: [facility] CATH LAB;  Service: Cardiovascular;  Laterality: N/A;   CARDIAC CATHETERIZATION     CORONARY STENT PLACEMENT     CYST REMOVAL TRUNK  1977   tailbone   LEFT HEART CATHETERIZATION WITH CORONARY ANGIOGRAM N/A 07/07/2012   Procedure: LEFT HEART CATHETERIZATION WITH CORONARY ANGIOGRAM;  Surgeon: Kathleene Hazel, MD;  Location: [facility] CATH LAB;  Service: Cardiovascular;  Laterality: N/A;   PERCUTANEOUS CORONARY STENT INTERVENTION (PCI-S)  07/07/2012   Procedure: PERCUTANEOUS CORONARY STENT INTERVENTION (PCI-S);  Surgeon: Kathleene Hazel, MD;  Location: [facility] CATH LAB;  Service: Cardiovascular;;   TONSILLECTOMY     Patient Active Problem List   Diagnosis Date Noted   Coronary artery disease involving native coronary artery of native heart without angina pectoris 12/23/2020   Type 2 diabetes mellitus with  complication, without long-term current use of insulin (HCC) 12/23/2020   Hyperlipidemia 01/31/2014   Pain in limb 12/26/2013   Disturbance of skin sensation 12/26/2013   Peripheral arterial disease (HCC) 01/23/2013   Leukocytosis    Monocytosis    Acute MI, inferior wall, initial episode of care (HCC) 07/08/2012   Tobacco use disorder 07/08/2012   Fibromyalgia 07/08/2012   Depression 07/08/2012   Diabetes mellitus type II, uncontrolled (HCC) 07/08/2012   REFERRING PROVIDER: Julieanne Cotton, PA-C   REFERRING DIAG: Acute pain of right shoulder   THERAPY DIAG:  Chronic right shoulder pain  Stiffness of right shoulder, not elsewhere classified  Rationale for Evaluation and Treatment: Rehabilitation  ONSET DATE: "months ago"   SUBJECTIVE:  SUBJECTIVE STATEMENT: Reports that she woke with increased R posterior shoulder pain and into her forearm. Didn't take her advil quick enough per patient report. Hand dominance: Right  PERTINENT HISTORY: History of a MI, type 2 diabetes, fibromyalgia, depression, current smoker, anxiety, and asthma  PAIN:  Are you having pain? Yes: NPRS scale: 6-7/10 Pain location: right shoulder and wrist Pain description: deep ache Aggravating factors: moving her arm Relieving factors: medication  PRECAUTIONS: Fall  WEIGHT BEARING RESTRICTIONS: No  FALLS:  Has patient fallen in last 6 months? Yes. Number of falls 1; she turned around and lost her balance about 6 months ago  PATIENT GOALS:improved shoulder mobility, clean her house, and reduced pain  NEXT MD VISIT: 05/16/23  OBJECTIVE:   DIAGNOSTIC FINDINGS: 01/12/23 right shoulder x-ray IMPRESSION: AC joint degenerative changes.  COGNITION: Overall cognitive status: Within functional limits for tasks  assessed     SENSATION: Light touch: No significant deficits bilaterally Patient reports numbness in the 2nd and 3rd digits of her right hand and increased sensitivity in her right arm.   UPPER EXTREMITY ROM:   Active ROM Right eval Left eval  Shoulder flexion 132 134  Shoulder extension    Shoulder abduction 88/ 110 (PROM); painful  119  Shoulder adduction    Shoulder internal rotation To T9; painful  To T4  Shoulder external rotation To occiput;familiar pain radiating into wrist To C7; limited by pulling in right side of chest   Elbow flexion    Elbow extension    Wrist flexion    Wrist extension    Wrist ulnar deviation    Wrist radial deviation    Wrist pronation    Wrist supination    (Blank rows = not tested)  UPPER EXTREMITY MMT:  MMT Right eval Left eval  Shoulder flexion 4/5; triceps pain 4/5  Shoulder extension    Shoulder abduction  4+/5  Shoulder adduction    Shoulder internal rotation 4+/5; "tight"  4+/5  Shoulder external rotation 4/5 4+/5  Middle trapezius    Lower trapezius    Elbow flexion    Elbow extension    Wrist flexion    Wrist extension    Wrist ulnar deviation    Wrist radial deviation    Wrist pronation    Wrist supination    Grip strength (lbs)    (Blank rows = not tested)  SHOULDER SPECIAL TESTS: Rotator cuff assessment: Drop arm test: negative  PALPATION:  Global tenderness to palpation throughout right shoulder with increased tenderness along triceps    TODAY'S TREATMENT:                                                                                                                                         DATE: 05/12/23 EXERCISE LOG  Exercise Repetitions and Resistance Comments  Seated ranger: flex X5 min   Seated ranger: circes X5 min  AAROM protraction X30 reps   AAROM upper trap X30 reps   AAROM ER X20 reps    Blank cell = exercise not performed today  Modalities: no redness or adverse reaction to today's  modalities  Date: 05/12/23 Unattended Estim: Shoulder, Pre-Mod, 15 mins, Pain Vaso: Shoulder, Low, 15 mins, Pain  PATIENT EDUCATION: Education details: ice vs heat, safety Person educated: Patient Education method: Explanation Education comprehension: verbalized understanding  HOME EXERCISE PROGRAM:  ASSESSMENT:  CLINICAL IMPRESSION: Patient presented in clinic with increased R posterior shoulder pain and into R forearm. Patient requested a lighter treatment due to pain. Patient able to tolerate the AAROM exercise session without complaint of pain. Normal modalities response noted following removal of the modalities. Patient reported a small improvement of pain at end of session.  OBJECTIVE IMPAIRMENTS: decreased activity tolerance, decreased ROM, decreased strength, hypomobility, impaired tone, impaired UE functional use, and pain.   ACTIVITY LIMITATIONS: carrying, lifting, bathing, dressing, reach over head, and caring for others  PARTICIPATION LIMITATIONS: meal prep, cleaning, laundry, driving, shopping, and community activity  PERSONAL FACTORS: Past/current experiences, Time since onset of injury/illness/exacerbation, and 3+ comorbidities: History of a MI, type 2 diabetes, fibromyalgia, depression, current smoker, anxiety, and asthma  are also affecting patient's functional outcome.   REHAB POTENTIAL: Fair    CLINICAL DECISION MAKING: Evolving/moderate complexity  EVALUATION COMPLEXITY: Moderate  GOALS: Goals reviewed with patient? Yes  LONG TERM GOALS: Target date: 05/24/23  Patient will be independent with her HEP.  Baseline:  Goal status: INITIAL  2.  Patient will be able to demonstrate right shoulder abduction to 110 degrees or greater for improved function reaching overhead.  Baseline:  Goal status: INITIAL  3.  Patient will be able to complete her daily activities without her familiar pain exceeding 7/10.  Baseline:  Goal status: INITIAL  4.  Patient will  report being able to wash her hair with her right upper extremity for improved independence.  Baseline:  Goal status: INITIAL  5.  Patient will report being able to clean her house without being limited by her familiar right shoulder symptoms.  Baseline:  Goal status: INITIAL  PLAN:  PT FREQUENCY: 2x/week  PT DURATION: 4 weeks  PLANNED INTERVENTIONS: Therapeutic exercises, Therapeutic activity, Neuromuscular re-education, Patient/Family education, Self Care, Joint mobilization, Electrical stimulation, Spinal mobilization, Cryotherapy, Moist heat, Taping, Vasopneumatic device, Manual therapy, and Re-evaluation  PLAN FOR NEXT SESSION: pulleys, isometrics, PROM, and modalities as needed  Marvell Fuller, PTA 05/12/2023, 3:20 PM

## 2023-05-16 ENCOUNTER — Ambulatory Visit: Payer: Medicare Other | Admitting: Surgical

## 2023-05-17 ENCOUNTER — Ambulatory Visit: Payer: Medicare Other

## 2023-05-17 DIAGNOSIS — M6281 Muscle weakness (generalized): Secondary | ICD-10-CM | POA: Diagnosis not present

## 2023-05-17 DIAGNOSIS — M25611 Stiffness of right shoulder, not elsewhere classified: Secondary | ICD-10-CM

## 2023-05-17 DIAGNOSIS — G8929 Other chronic pain: Secondary | ICD-10-CM

## 2023-05-17 DIAGNOSIS — M25511 Pain in right shoulder: Secondary | ICD-10-CM | POA: Diagnosis not present

## 2023-05-17 NOTE — Therapy (Signed)
OUTPATIENT PHYSICAL THERAPY SHOULDER TREATMENT   Patient Name: [name] MRN: [id number] DOB:1956/01/18, 67 y.o., female Today's Date: 05/17/2023  END OF SESSION:  PT End of Session - 05/17/23 1519     Visit Number 7    Number of Visits 8    Date for PT Re-Evaluation 06/24/23    PT Start Time 1515    PT Stop Time 1606    PT Time Calculation (min) 51 min    Activity Tolerance Patient tolerated treatment well    Behavior During Therapy [facility] for tasks assessed/performed             Past Medical History:  Diagnosis Date   Acute MI, inferior wall, initial episode of care (HCC) 07/08/2012   Cath-07/08/11 -distal RCA stent DES (99%), LAD 20-30%. EF 50% inferior hypokinesis  (infarct)   Anxiety    Asthma    Coronary artery disease    Depression    Diabetes mellitus    Fibromyalgia    Leukocytosis    Monocytosis    Peripheral arterial disease (HCC) 01/23/2013   Tobacco use disorder 07/08/2012   Past Surgical History:  Procedure Laterality Date   ABDOMINAL AORTAGRAM N/A 02/07/2013   Procedure: ABDOMINAL Ronny Flurry;  Surgeon: Iran Ouch, MD;  Location: [facility] CATH LAB;  Service: Cardiovascular;  Laterality: N/A;   CARDIAC CATHETERIZATION     CORONARY STENT PLACEMENT     CYST REMOVAL TRUNK  1977   tailbone   LEFT HEART CATHETERIZATION WITH CORONARY ANGIOGRAM N/A 07/07/2012   Procedure: LEFT HEART CATHETERIZATION WITH CORONARY ANGIOGRAM;  Surgeon: Kathleene Hazel, MD;  Location: [facility] CATH LAB;  Service: Cardiovascular;  Laterality: N/A;   PERCUTANEOUS CORONARY STENT INTERVENTION (PCI-S)  07/07/2012   Procedure: PERCUTANEOUS CORONARY STENT INTERVENTION (PCI-S);  Surgeon: Kathleene Hazel, MD;  Location: [facility] CATH LAB;  Service: Cardiovascular;;   TONSILLECTOMY     Patient Active Problem List   Diagnosis Date Noted   Coronary artery disease involving native coronary artery of native heart without angina pectoris 12/23/2020   Type 2 diabetes mellitus with  complication, without long-term current use of insulin (HCC) 12/23/2020   Hyperlipidemia 01/31/2014   Pain in limb 12/26/2013   Disturbance of skin sensation 12/26/2013   Peripheral arterial disease (HCC) 01/23/2013   Leukocytosis    Monocytosis    Acute MI, inferior wall, initial episode of care (HCC) 07/08/2012   Tobacco use disorder 07/08/2012   Fibromyalgia 07/08/2012   Depression 07/08/2012   Diabetes mellitus type II, uncontrolled (HCC) 07/08/2012   REFERRING PROVIDER: Julieanne Cotton, PA-C   REFERRING DIAG: Acute pain of right shoulder   THERAPY DIAG:  Chronic right shoulder pain  Stiffness of right shoulder, not elsewhere classified  Muscle weakness (generalized)  Rationale for Evaluation and Treatment: Rehabilitation  ONSET DATE: "months ago"   SUBJECTIVE:  SUBJECTIVE STATEMENT: Patient reports the back of her right arm is hurting more now. She notes that it is waking her up at night.  Hand dominance: Right  PERTINENT HISTORY: History of a MI, type 2 diabetes, fibromyalgia, depression, current smoker, anxiety, and asthma  PAIN:  Are you having pain? Yes: NPRS scale: 7.5-8/10 Pain location: right shoulder and wrist Pain description: deep ache Aggravating factors: moving her arm Relieving factors: medication  PRECAUTIONS: Fall  WEIGHT BEARING RESTRICTIONS: No  FALLS:  Has patient fallen in last 6 months? Yes. Number of falls 1; she turned around and lost her balance about 6 months ago  PATIENT GOALS:improved shoulder mobility, clean her house, and reduced pain  NEXT MD VISIT: 06/01/23  OBJECTIVE:   DIAGNOSTIC FINDINGS: 01/12/23 right shoulder x-ray IMPRESSION: AC joint degenerative changes.  COGNITION: Overall cognitive status: Within functional limits for tasks  assessed     SENSATION: Light touch: No significant deficits bilaterally Patient reports numbness in the 2nd and 3rd digits of her right hand and increased sensitivity in her right arm.   UPPER EXTREMITY ROM:   Active ROM Right eval Left eval  Shoulder flexion 132 134  Shoulder extension    Shoulder abduction 88/ 110 (PROM); painful  119  Shoulder adduction    Shoulder internal rotation To T9; painful  To T4  Shoulder external rotation To occiput;familiar pain radiating into wrist To C7; limited by pulling in right side of chest   Elbow flexion    Elbow extension    Wrist flexion    Wrist extension    Wrist ulnar deviation    Wrist radial deviation    Wrist pronation    Wrist supination    (Blank rows = not tested)  UPPER EXTREMITY MMT:  MMT Right eval Left eval  Shoulder flexion 4/5; triceps pain 4/5  Shoulder extension    Shoulder abduction  4+/5  Shoulder adduction    Shoulder internal rotation 4+/5; "tight"  4+/5  Shoulder external rotation 4/5 4+/5  Middle trapezius    Lower trapezius    Elbow flexion    Elbow extension    Wrist flexion    Wrist extension    Wrist ulnar deviation    Wrist radial deviation    Wrist pronation    Wrist supination    Grip strength (lbs)    (Blank rows = not tested)  SHOULDER SPECIAL TESTS: Rotator cuff assessment: Drop arm test: negative  PALPATION:  Global tenderness to palpation throughout right shoulder with increased tenderness along triceps    TODAY'S TREATMENT:                                                                                                                                         DATE:  05/17/23 EXERCISE LOG  Exercise Repetitions and Resistance Comments  Nustep  L3 x 11 minutes  Utilizing UE and LE   Wall ladder  5 reps  Max #28  AA ER with cane  2 minutes    L elbow extension isometric  3 minutes w/ 5 second hold    AA cane flexion 2.5 minutes      Blank cell =  exercise not performed today  Modalities: no redness or adverse reaction to today's modalities  Date:  Unattended Estim: Shoulder, IFC @ 80-150 Hz w/ 40% scan, 15 mins, Pain Vaso: Shoulder, 34 degrees; low pressure, 15 mins, Pain   05/12/23 EXERCISE LOG  Exercise Repetitions and Resistance Comments  Seated ranger: flex X5 min   Seated ranger: circes X5 min   AAROM protraction X30 reps   AAROM upper trap X30 reps   AAROM ER X20 reps    Blank cell = exercise not performed today  Modalities: no redness or adverse reaction to today's modalities  Date: 05/12/23 Unattended Estim: Shoulder, Pre-Mod, 15 mins, Pain Vaso: Shoulder, Low, 15 mins, Pain  PATIENT EDUCATION: Education details: ice vs heat, safety Person educated: Patient Education method: Explanation Education comprehension: verbalized understanding  HOME EXERCISE PROGRAM:  ASSESSMENT:  CLINICAL IMPRESSION: Treatment focused on active assisted range of motion with fatigue being her primary limiting factor. She required minimal cueing with active assisted external rotation to prevent shoulder abduction. She required brief rest breaks throughout treatment due to muscular fatigue. She reported that her shoulder felt "a little worse" upon the conclusion of treatment. She continues to require skilled physical therapy to address her remaining impairments to maximize her functional mobility.   OBJECTIVE IMPAIRMENTS: decreased activity tolerance, decreased ROM, decreased strength, hypomobility, impaired tone, impaired UE functional use, and pain.   ACTIVITY LIMITATIONS: carrying, lifting, bathing, dressing, reach over head, and caring for others  PARTICIPATION LIMITATIONS: meal prep, cleaning, laundry, driving, shopping, and community activity  PERSONAL FACTORS: Past/current experiences, Time since onset of injury/illness/exacerbation, and 3+ comorbidities: History of a MI, type 2 diabetes, fibromyalgia, depression, current smoker,  anxiety, and asthma  are also affecting patient's functional outcome.   REHAB POTENTIAL: Fair    CLINICAL DECISION MAKING: Evolving/moderate complexity  EVALUATION COMPLEXITY: Moderate  GOALS: Goals reviewed with patient? Yes  LONG TERM GOALS: Target date: 05/24/23  Patient will be independent with her HEP.  Baseline:  Goal status: INITIAL  2.  Patient will be able to demonstrate right shoulder abduction to 110 degrees or greater for improved function reaching overhead.  Baseline:  Goal status: INITIAL  3.  Patient will be able to complete her daily activities without her familiar pain exceeding 7/10.  Baseline:  Goal status: INITIAL  4.  Patient will report being able to wash her hair with her right upper extremity for improved independence.  Baseline:  Goal status: INITIAL  5.  Patient will report being able to clean her house without being limited by her familiar right shoulder symptoms.  Baseline:  Goal status: INITIAL  PLAN:  PT FREQUENCY: 2x/week  PT DURATION: 4 weeks  PLANNED INTERVENTIONS: Therapeutic exercises, Therapeutic activity, Neuromuscular re-education, Patient/Family education, Self Care, Joint mobilization, Electrical stimulation, Spinal mobilization, Cryotherapy, Moist heat, Taping, Vasopneumatic device, Manual therapy, and Re-evaluation  PLAN FOR NEXT SESSION: pulleys, isometrics, PROM, and modalities as needed  Granville Lewis, PT 05/17/2023, 6:02 PM

## 2023-05-18 ENCOUNTER — Ambulatory Visit: Payer: Medicare Other | Admitting: Adult Health

## 2023-05-19 ENCOUNTER — Ambulatory Visit: Payer: Medicare Other

## 2023-05-19 DIAGNOSIS — M6281 Muscle weakness (generalized): Secondary | ICD-10-CM

## 2023-05-19 DIAGNOSIS — M25511 Pain in right shoulder: Secondary | ICD-10-CM | POA: Diagnosis not present

## 2023-05-19 DIAGNOSIS — G8929 Other chronic pain: Secondary | ICD-10-CM | POA: Diagnosis not present

## 2023-05-19 DIAGNOSIS — M25611 Stiffness of right shoulder, not elsewhere classified: Secondary | ICD-10-CM

## 2023-05-19 NOTE — Therapy (Signed)
OUTPATIENT PHYSICAL THERAPY SHOULDER TREATMENT   Patient Name: [name] MRN: [id number] DOB:1956/07/28, 67 y.o., female Today's Date: 05/19/2023  END OF SESSION:  PT End of Session - 05/19/23 1544     Visit Number 8    Number of Visits 8    Date for PT Re-Evaluation 06/24/23    PT Start Time 1520    PT Stop Time 1558    PT Time Calculation (min) 38 min    Activity Tolerance Patient tolerated treatment well    Behavior During Therapy WFL for tasks assessed/performed              Past Medical History:  Diagnosis Date   Acute MI, inferior wall, initial episode of care (HCC) 07/08/2012   Cath-07/08/11 -distal RCA stent DES (99%), LAD 20-30%. EF 50% inferior hypokinesis  (infarct)   Anxiety    Asthma    Coronary artery disease    Depression    Diabetes mellitus    Fibromyalgia    Leukocytosis    Monocytosis    Peripheral arterial disease (HCC) 01/23/2013   Tobacco use disorder 07/08/2012   Past Surgical History:  Procedure Laterality Date   ABDOMINAL AORTAGRAM N/A 02/07/2013   Procedure: ABDOMINAL Ronny Flurry;  Surgeon: Iran Ouch, MD;  Location: [facility] CATH LAB;  Service: Cardiovascular;  Laterality: N/A;   CARDIAC CATHETERIZATION     CORONARY STENT PLACEMENT     CYST REMOVAL TRUNK  1977   tailbone   LEFT HEART CATHETERIZATION WITH CORONARY ANGIOGRAM N/A 07/07/2012   Procedure: LEFT HEART CATHETERIZATION WITH CORONARY ANGIOGRAM;  Surgeon: Kathleene Hazel, MD;  Location: [facility] CATH LAB;  Service: Cardiovascular;  Laterality: N/A;   PERCUTANEOUS CORONARY STENT INTERVENTION (PCI-S)  07/07/2012   Procedure: PERCUTANEOUS CORONARY STENT INTERVENTION (PCI-S);  Surgeon: Kathleene Hazel, MD;  Location: [facility] CATH LAB;  Service: Cardiovascular;;   TONSILLECTOMY     Patient Active Problem List   Diagnosis Date Noted   Coronary artery disease involving native coronary artery of native heart without angina pectoris 12/23/2020   Type 2 diabetes mellitus with  complication, without long-term current use of insulin (HCC) 12/23/2020   Hyperlipidemia 01/31/2014   Pain in limb 12/26/2013   Disturbance of skin sensation 12/26/2013   Peripheral arterial disease (HCC) 01/23/2013   Leukocytosis    Monocytosis    Acute MI, inferior wall, initial episode of care (HCC) 07/08/2012   Tobacco use disorder 07/08/2012   Fibromyalgia 07/08/2012   Depression 07/08/2012   Diabetes mellitus type II, uncontrolled (HCC) 07/08/2012   REFERRING PROVIDER: Julieanne Cotton, PA-C   REFERRING DIAG: Acute pain of right shoulder   THERAPY DIAG:  Chronic right shoulder pain  Stiffness of right shoulder, not elsewhere classified  Muscle weakness (generalized)  Rationale for Evaluation and Treatment: Rehabilitation  ONSET DATE: "months ago"   SUBJECTIVE:  SUBJECTIVE STATEMENT: Patient reports that she was really hurting after her last appointment. She notes that the pain was going down her arm from her shoulder. She feels that therapy has not gotten her shoulder any better as she even felt worse yesterday.  Hand dominance: Right  PERTINENT HISTORY: History of a MI, type 2 diabetes, fibromyalgia, depression, current smoker, anxiety, and asthma  PAIN:  Are you having pain? Yes: NPRS scale: 7.5/10 Pain location: right shoulder and wrist Pain description: deep ache Aggravating factors: moving her arm Relieving factors: medication  PRECAUTIONS: Fall  WEIGHT BEARING RESTRICTIONS: No  FALLS:  Has patient fallen in last 6 months? Yes. Number of falls 1; she turned around and lost her balance about 6 months ago  PATIENT GOALS:improved shoulder mobility, clean her house, and reduced pain  NEXT MD VISIT: 06/01/23  OBJECTIVE:   DIAGNOSTIC FINDINGS: 01/12/23 right shoulder  x-ray IMPRESSION: AC joint degenerative changes.  COGNITION: Overall cognitive status: Within functional limits for tasks assessed     SENSATION: Light touch: No significant deficits bilaterally Patient reports numbness in the 2nd and 3rd digits of her right hand and increased sensitivity in her right arm.   UPPER EXTREMITY ROM:   Active ROM Right eval Left eval  Shoulder flexion 132 134  Shoulder extension    Shoulder abduction 88/ 110 (PROM); painful  119  Shoulder adduction    Shoulder internal rotation To T9; painful  To T4  Shoulder external rotation To occiput;familiar pain radiating into wrist To C7; limited by pulling in right side of chest   Elbow flexion    Elbow extension    Wrist flexion    Wrist extension    Wrist ulnar deviation    Wrist radial deviation    Wrist pronation    Wrist supination    (Blank rows = not tested)  UPPER EXTREMITY MMT:  MMT Right eval Left eval  Shoulder flexion 4/5; triceps pain 4/5  Shoulder extension    Shoulder abduction  4+/5  Shoulder adduction    Shoulder internal rotation 4+/5; "tight"  4+/5  Shoulder external rotation 4/5 4+/5  Middle trapezius    Lower trapezius    Elbow flexion    Elbow extension    Wrist flexion    Wrist extension    Wrist ulnar deviation    Wrist radial deviation    Wrist pronation    Wrist supination    Grip strength (lbs)    (Blank rows = not tested)  SHOULDER SPECIAL TESTS: Rotator cuff assessment: Drop arm test: negative  PALPATION:  Global tenderness to palpation throughout right shoulder with increased tenderness along triceps    TODAY'S TREATMENT:                                                                                                                                         DATE:  05/19/23 EXERCISE LOG  Exercise Repetitions and Resistance Comments  Seated ranger  2 minutes each  Flexion and circles  Scapular retraction  3 minutes                  Blank cell = exercise not performed today  Modalities: no redness or adverse reaction to today's modalities  Date:  Unattended Estim: Shoulder, pre mod @ 80-150 Hz, 15 mins, Pain Vaso: Shoulder, 34 degrees; low pressure, 15 mins, Pain                                   05/17/23 EXERCISE LOG  Exercise Repetitions and Resistance Comments  Nustep  L3 x 11 minutes  Utilizing UE and LE   Wall ladder  5 reps  Max #28  AA ER with cane  2 minutes    L elbow extension isometric  3 minutes w/ 5 second hold    AA cane flexion 2.5 minutes      Blank cell = exercise not performed today  Modalities: no redness or adverse reaction to today's modalities  Date:  Unattended Estim: Shoulder, IFC @ 80-150 Hz w/ 40% scan, 15 mins, Pain Vaso: Shoulder, 34 degrees; low pressure, 15 mins, Pain   05/12/23 EXERCISE LOG  Exercise Repetitions and Resistance Comments  Seated ranger: flex X5 min   Seated ranger: circes X5 min   AAROM protraction X30 reps   AAROM upper trap X30 reps   AAROM ER X20 reps    Blank cell = exercise not performed today  Modalities: no redness or adverse reaction to today's modalities  Date: 05/12/23 Unattended Estim: Shoulder, Pre-Mod, 15 mins, Pain Vaso: Shoulder, Low, 15 mins, Pain  PATIENT EDUCATION: Education details: POC, prognosis, and progress with therapy Person educated: Patient Education method: Explanation Education comprehension: verbalized understanding  HOME EXERCISE PROGRAM:  ASSESSMENT:  CLINICAL IMPRESSION: Patient has made minimal progress with skilled physical therapy as evidenced by her subjective reports, objective measures, functional mobility, and progress toward her goals. She was unable to make significant progress with skilled physical therapy. Treatment focused on familiar interventions for reduced pain and improved shoulder mobility. However, she required to avoid further active interventions due to her pain severity and irritability.  She would benefit from additional medical intervention due to her continued pain severity and irritability.   OBJECTIVE IMPAIRMENTS: decreased activity tolerance, decreased ROM, decreased strength, hypomobility, impaired tone, impaired UE functional use, and pain.   ACTIVITY LIMITATIONS: carrying, lifting, bathing, dressing, reach over head, and caring for others  PARTICIPATION LIMITATIONS: meal prep, cleaning, laundry, driving, shopping, and community activity  PERSONAL FACTORS: Past/current experiences, Time since onset of injury/illness/exacerbation, and 3+ comorbidities: History of a MI, type 2 diabetes, fibromyalgia, depression, current smoker, anxiety, and asthma  are also affecting patient's functional outcome.   REHAB POTENTIAL: Fair    CLINICAL DECISION MAKING: Evolving/moderate complexity  EVALUATION COMPLEXITY: Moderate  GOALS: Goals reviewed with patient? Yes  LONG TERM GOALS: Target date: 05/24/23  Patient will be independent with her HEP.  Baseline:  Goal status: MET  2.  Patient will be able to demonstrate right shoulder abduction to 110 degrees or greater for improved function reaching overhead.  Baseline: 107 degrees Goal status: NEARLY MET   3.  Patient will be able to complete her daily activities without her familiar pain exceeding 7/10.  Baseline:  Goal status: NOT MET  4.  Patient will report  being able to wash her hair with her right upper extremity for improved independence.  Baseline: unable do to pain  Goal status: NOT MET  5.  Patient will report being able to clean her house without being limited by her familiar right shoulder symptoms.  Baseline: limited due to pain  Goal status: NOT MET  PLAN:  PT FREQUENCY: 2x/week  PT DURATION: 4 weeks  PLANNED INTERVENTIONS: Therapeutic exercises, Therapeutic activity, Neuromuscular re-education, Patient/Family education, Self Care, Joint mobilization, Electrical stimulation, Spinal mobilization,  Cryotherapy, Moist heat, Taping, Vasopneumatic device, Manual therapy, and Re-evaluation  PLAN FOR NEXT SESSION: pulleys, isometrics, PROM, and modalities as needed  Granville Lewis, PT 05/19/2023, 4:19 PM

## 2023-05-20 ENCOUNTER — Ambulatory Visit (INDEPENDENT_AMBULATORY_CARE_PROVIDER_SITE_OTHER): Payer: Medicare Other | Admitting: Adult Health

## 2023-05-20 ENCOUNTER — Encounter: Payer: Self-pay | Admitting: Adult Health

## 2023-05-20 VITALS — BP 100/60 | HR 68 | Temp 98.3°F | Ht 63.0 in | Wt 128.0 lb

## 2023-05-20 DIAGNOSIS — J0101 Acute recurrent maxillary sinusitis: Secondary | ICD-10-CM

## 2023-05-20 DIAGNOSIS — R300 Dysuria: Secondary | ICD-10-CM

## 2023-05-20 DIAGNOSIS — S46811S Strain of other muscles, fascia and tendons at shoulder and upper arm level, right arm, sequela: Secondary | ICD-10-CM | POA: Diagnosis not present

## 2023-05-20 LAB — POCT URINALYSIS DIPSTICK
Bilirubin, UA: NEGATIVE
Blood, UA: NEGATIVE
Glucose, UA: NEGATIVE
Ketones, UA: NEGATIVE
Nitrite, UA: POSITIVE
Protein, UA: NEGATIVE
Spec Grav, UA: 1.015 (ref 1.010–1.025)
Urobilinogen, UA: 0.2 E.U./dL
pH, UA: 5.5 (ref 5.0–8.0)

## 2023-05-20 MED ORDER — TRAMADOL HCL 50 MG PO TABS
50.0000 mg | ORAL_TABLET | Freq: Three times a day (TID) | ORAL | 1 refills | Status: AC | PRN
Start: 2023-05-20 — End: 2023-05-25

## 2023-05-20 NOTE — Progress Notes (Signed)
Subjective:    Patient ID: [name], female    DOB: [birth year], 67 y.o.   MRN: [id number]  HPI  67 year old female who  has a past medical history of Acute MI, inferior wall, initial episode of care [facility]) (07/08/2012), Anxiety, Asthma, Coronary artery disease, Depression, Diabetes mellitus, Fibromyalgia, Leukocytosis, Monocytosis, Peripheral arterial disease (HCC) (01/23/2013), and Tobacco use disorder (07/08/2012).  She presents to the office today for concern of recurrent UTI and recurrent sinusitis.   UTI - she reports that her symptoms have been present for 2 weeks. She reports dysuria, frequent urination, and incontinence.  She has had some fevers and chills recently.  She refuses to see a urologist  Sinusitis -symptoms have been present for roughly 1 month.  Symptoms include "extreme congestion, facial pain, pressure, discolored rhinorrhea.   She is also been doing physical therapy for traumatic rupture of the supraspinatus tendon of the right shoulder, it was decided today that she needed to quit physical therapy due to the pain becoming worse.  She plans on seeing orthopedics early next month.  At home she has been taking Advil and aspirin to help control her pain but is "taking way too much".  Review of Systems See HPI   Past Medical History:  Diagnosis Date   Acute MI, inferior wall, initial episode of care (HCC) 07/08/2012   Cath-07/08/11 -distal RCA stent DES (99%), LAD 20-30%. EF 50% inferior hypokinesis  (infarct)   Anxiety    Asthma    Coronary artery disease    Depression    Diabetes mellitus    Fibromyalgia    Leukocytosis    Monocytosis    Peripheral arterial disease (HCC) 01/23/2013   Tobacco use disorder 07/08/2012    Social History   Socioeconomic History   Marital status: Married    Spouse name: Not on file   Number of children: 3   Years of education: college   Highest education level: Bachelor's degree (e.g., BA, AB, BS)  Occupational History    Not on file  Tobacco Use   Smoking status: Every Day    Current packs/day: 1.00    Average packs/day: 1 pack/day for 43.0 years (43.0 ttl pk-yrs)    Types: Cigarettes   Smokeless tobacco: Never  Substance and Sexual Activity   Alcohol use: No    Alcohol/week: 0.0 standard drinks of alcohol    Comment: Rarely   Drug use: No   Sexual activity: Yes  Other Topics Concern   Not on file  Social History Narrative   Lives with husband, married for 20 years.      They have three grown chlildren.   She is not currently working and is on disability   Highest level of education:  Engineer, maintenance (IT)   Pets: two dogs and three rabbits.       Husband is a long Production assistant, radio.       Is unable to enjoy any activities because of body pains.       Social Determinants of Health   Financial Resource Strain: Low Risk  (04/13/2023)   Overall Financial Resource Strain (CARDIA)    Difficulty of Paying Living Expenses: Not hard at all  Food Insecurity: Patient Declined (05/13/2023)   Hunger Vital Sign    Worried About Running Out of Food in the Last Year: Patient declined    Ran Out of Food in the Last Year: Patient declined  Transportation Needs: Unmet Transportation Needs (05/13/2023)   PRAPARE -  Administrator, Civil Service (Medical): Yes    Lack of Transportation (Non-Medical): Yes  Physical Activity: Unknown (05/13/2023)   Exercise Vital Sign    Days of Exercise per Week: Patient declined    Minutes of Exercise per Session: 0 min  Recent Concern: Physical Activity - Inactive (04/13/2023)   Exercise Vital Sign    Days of Exercise per Week: 0 days    Minutes of Exercise per Session: 0 min  Stress: Stress Concern Present (05/13/2023)   [employer] of Occupational Health - Occupational Stress Questionnaire    Feeling of Stress : Very much  Social Connections: Moderately Isolated (05/13/2023)   Social Connection and Isolation Panel [NHANES]    Frequency of Communication with  Friends and Family: Once a week    Frequency of Social Gatherings with Friends and Family: Never    Attends Religious Services: 1 to 4 times per year    Active Member of [employer] or Organizations: No    Attends Banker Meetings: Never    Marital Status: Married  Catering manager Violence: Not At Risk (04/13/2023)   Humiliation, Afraid, Rape, and Kick questionnaire    Fear of Current or Ex-Partner: No    Emotionally Abused: No    Physically Abused: No    Sexually Abused: No    Past Surgical History:  Procedure Laterality Date   ABDOMINAL AORTAGRAM N/A 02/07/2013   Procedure: ABDOMINAL Ronny Flurry;  Surgeon: Iran Ouch, MD;  Location: [facility] CATH LAB;  Service: Cardiovascular;  Laterality: N/A;   CARDIAC CATHETERIZATION     CORONARY STENT PLACEMENT     CYST REMOVAL TRUNK  1977   tailbone   LEFT HEART CATHETERIZATION WITH CORONARY ANGIOGRAM N/A 07/07/2012   Procedure: LEFT HEART CATHETERIZATION WITH CORONARY ANGIOGRAM;  Surgeon: Kathleene Hazel, MD;  Location: [facility] CATH LAB;  Service: Cardiovascular;  Laterality: N/A;   PERCUTANEOUS CORONARY STENT INTERVENTION (PCI-S)  07/07/2012   Procedure: PERCUTANEOUS CORONARY STENT INTERVENTION (PCI-S);  Surgeon: Kathleene Hazel, MD;  Location: [facility] CATH LAB;  Service: Cardiovascular;;   TONSILLECTOMY      Family History  Problem Relation Age of Onset   Kidney disease Mother    Diabetes Mother        DM Type II   Alzheimer's disease Mother    Diabetes Father        IDDM   Heart disease Father    Cancer Father        bone cancer died at 84   Cancer Sister        bone cancer at age 16   Multiple sclerosis Daughter    Diabetes Son 2       DM Type I   Heart disease Maternal Grandfather    Heart disease Paternal Grandmother    Diabetes Son 37       DM Type I    Allergies  Allergen Reactions   Erythromycin Other (See Comments)   Statins     SEVERE MUSCLE PAIN - failed lipitor, Livalo, red yeast rice (lovastatin),  and she thinks Zocor and pravastatin   Ace Inhibitors Cough    Patient refuses to take meds   Cymbalta [Duloxetine Hcl]     Drowsiness   Doxycycline Other (See Comments)    diarrhea   Jardiance [Empagliflozin]     Yeast infections     Fentanyl Anxiety    Current Outpatient Medications on File Prior to Visit  Medication Sig Dispense Refill   amLODipine (  NORVASC) 5 MG tablet Take 1 tablet by mouth once daily 90 tablet 0   busPIRone (BUSPAR) 15 MG tablet TAKE 1 TABLET BY MOUTH THREE TIMES DAILY 270 tablet 0   Cholecalciferol (VITAMIN D3) 2000 units TABS Take 1 tablet by mouth daily.     cilostazol (PLETAL) 100 MG tablet Take 0.5 tablets (50 mg total) by mouth 2 (two) times daily. 90 tablet 3   Continuous Blood Gluc Receiver (DEXCOM G7 RECEIVER) DEVI Use with Dexcom G7 sensory 1 each 0   Continuous Glucose Sensor (DEXCOM G7 SENSOR) MISC USE AS DIRECTED EVERY 10 DAYS 3 each 3   FLUoxetine (PROZAC) 40 MG capsule Take 1 capsule (40 mg total) by mouth daily. 90 capsule 0   glipiZIDE (GLUCOTROL XL) 10 MG 24 hr tablet Take 1 tablet by mouth once daily with breakfast 90 tablet 0   isosorbide mononitrate (IMDUR) 60 MG 24 hr tablet TAKE 1 & 1/2 (ONE & ONE-HALF) TABLETS BY MOUTH ONCE DAILY 135 tablet 0   levETIRAcetam (KEPPRA) 250 MG tablet TAKE 1 TABLET BY MOUTH THREE TIMES DAILY 270 tablet 0   nitroGLYCERIN (NITROSTAT) 0.4 MG SL tablet Place 1 tablet (0.4 mg total) under the tongue every 5 (five) minutes x 3 doses as needed for chest pain. 30 tablet 12   nystatin cream (MYCOSTATIN) Apply 1 application topically 2 (two) times daily. 30 g 2   pravastatin (PRAVACHOL) 40 MG tablet TAKE 1 TABLET BY MOUTH AT BEDTIME 90 tablet 0   pregabalin (LYRICA) 150 MG capsule Take 1 capsule by mouth twice daily 60 capsule 2   propranolol (INDERAL) 40 MG tablet Take 1 tablet by mouth twice daily 180 tablet 0   LANTUS SOLOSTAR 100 UNIT/ML Solostar Pen Inject 10 Units into the skin 2 (two) times daily. (Patient taking  differently: Inject 8 Units into the skin 2 (two) times daily.) 6 mL 2   No current facility-administered medications on file prior to visit.    BP 100/60   Pulse 68   Temp 98.3 F (36.8 C) (Oral)   Ht 5\' 3"  (1.6 m)   Wt 128 lb (58.1 kg)   SpO2 98%   BMI 22.67 kg/m       Objective:   Physical Exam Vitals and nursing note reviewed.  Constitutional:      Appearance: Normal appearance.  HENT:     Nose: Congestion present. No rhinorrhea.     Right Turbinates: Enlarged and swollen.     Left Turbinates: Enlarged and swollen.     Right Sinus: Maxillary sinus tenderness and frontal sinus tenderness present.     Left Sinus: Maxillary sinus tenderness and frontal sinus tenderness present.  Cardiovascular:     Rate and Rhythm: Normal rate and regular rhythm.     Heart sounds: Normal heart sounds.  Pulmonary:     Effort: Pulmonary effort is normal.     Breath sounds: Normal breath sounds.  Abdominal:     General: Abdomen is flat. Bowel sounds are normal.     Palpations: Abdomen is soft.     Tenderness: There is no right CVA tenderness or left CVA tenderness.  Musculoskeletal:        General: Tenderness present.     Comments: Right arm in sling   Skin:    General: Skin is warm and dry.  Neurological:     General: No focal deficit present.     Mental Status: She is alert and oriented to person, place, and time.  Psychiatric:  Mood and Affect: Mood normal.        Behavior: Behavior normal.        Thought Content: Thought content normal.        Judgment: Judgment normal.       Assessment & Plan:  1. Dysuria  - POC Urinalysis Dipstick + leuks and nitrite -Will hold off on treating until culture comes back.  Likely need to place on daily antibiotic to prevent UTIs. - Culture, Urine; Future  2. Acute recurrent maxillary sinusitis -Hold off on treating until we see what we are going to treat her UTI with.  I am going to order a CT maxillofacial due to the recurrent  nature. - Quitting smoking is advised  - CT MAXILLOFACIAL WO CONTRAST; Future  3. Traumatic rupture of supraspinatus tendon of right shoulder, sequela - Will provide short course of Tramadol to use so she can cut back on NSAIDS - Follow up with Orthopedics as directed - traMADol (ULTRAM) 50 MG tablet; Take 1 tablet (50 mg total) by mouth every 8 (eight) hours as needed for up to 5 days.  Dispense: 15 tablet; Refill: 1  Shirline Frees, NP  Time spent with patient today was 32 minutes which consisted of chart review, discussing recurrent UTI, recurrent sinusitis, and right shoulder pain  work up, treatment answering questions and documentation.

## 2023-05-22 LAB — URINE CULTURE
MICRO NUMBER:: 15374048
SPECIMEN QUALITY:: ADEQUATE

## 2023-05-23 ENCOUNTER — Encounter: Payer: Self-pay | Admitting: Adult Health

## 2023-05-24 ENCOUNTER — Other Ambulatory Visit: Payer: Self-pay | Admitting: Adult Health

## 2023-05-24 DIAGNOSIS — H524 Presbyopia: Secondary | ICD-10-CM | POA: Diagnosis not present

## 2023-05-24 DIAGNOSIS — E119 Type 2 diabetes mellitus without complications: Secondary | ICD-10-CM | POA: Diagnosis not present

## 2023-05-24 MED ORDER — NITROFURANTOIN MONOHYD MACRO 100 MG PO CAPS: 100.0000 mg | ORAL_CAPSULE | Freq: Every day | ORAL | 3 refills | Status: DC

## 2023-05-24 MED ORDER — LEVOFLOXACIN 500 MG PO TABS: 500.0000 mg | ORAL_TABLET | Freq: Every day | ORAL | 0 refills | Status: DC

## 2023-05-28 ENCOUNTER — Other Ambulatory Visit: Payer: Self-pay | Admitting: Adult Health

## 2023-05-28 DIAGNOSIS — E782 Mixed hyperlipidemia: Secondary | ICD-10-CM

## 2023-05-31 ENCOUNTER — Other Ambulatory Visit: Payer: Self-pay | Admitting: Adult Health

## 2023-05-31 DIAGNOSIS — E118 Type 2 diabetes mellitus with unspecified complications: Secondary | ICD-10-CM

## 2023-05-31 DIAGNOSIS — I2119 ST elevation (STEMI) myocardial infarction involving other coronary artery of inferior wall: Secondary | ICD-10-CM

## 2023-05-31 DIAGNOSIS — R569 Unspecified convulsions: Secondary | ICD-10-CM

## 2023-06-01 ENCOUNTER — Encounter: Payer: Self-pay | Admitting: Surgical

## 2023-06-01 ENCOUNTER — Ambulatory Visit: Payer: Medicare Other | Admitting: Surgical

## 2023-06-01 DIAGNOSIS — S46811A Strain of other muscles, fascia and tendons at shoulder and upper arm level, right arm, initial encounter: Secondary | ICD-10-CM | POA: Diagnosis not present

## 2023-06-02 ENCOUNTER — Ambulatory Visit (HOSPITAL_BASED_OUTPATIENT_CLINIC_OR_DEPARTMENT_OTHER)
Admission: RE | Admit: 2023-06-02 | Discharge: 2023-06-02 | Disposition: A | Discharge status: Home / Self Care | Payer: Medicare Other | Source: Ambulatory Visit | Attending: Adult Health | Admitting: Adult Health

## 2023-06-02 DIAGNOSIS — J329 Chronic sinusitis, unspecified: Secondary | ICD-10-CM | POA: Diagnosis not present

## 2023-06-02 DIAGNOSIS — J0101 Acute recurrent maxillary sinusitis: Secondary | ICD-10-CM | POA: Diagnosis not present

## 2023-06-03 ENCOUNTER — Encounter: Payer: Self-pay | Admitting: Surgical

## 2023-06-03 NOTE — Progress Notes (Signed)
Follow-up Office Visit Note   Patient: [name]           Date of Birth: [birth year]           MRN: [id number] Visit Date: 06/01/2023 Requested by: Shirline Frees, NP [address] [place],  [place] [address] PCP: Shirline Frees, NP  Subjective: Chief Complaint  Patient presents with   Right Shoulder - Follow-up    HPI: [name] is a 67 y.o. female who returns to the office for follow-up visit.    Plan at last visit was: Patient is a 67 year old female who presents for evaluation of right shoulder pain. She was playing tug-of-war with her small dog about 2 months ago and attributes her onset of symptoms to this event though her symptoms really started about 2 to 3 days after the game of tug-of-war. She has had MRI without contrast ordered by her PCP demonstrating partial-thickness supraspinatus tear on the articular side. She also has lipoma noted on MRI that is palpable on exam today but nontender. No significant weakness of the rotator cuff and she is able to lift her arm overhead though she does have discomfort with this. We discussed options available patient and after discussion she would like to try shoulder injection and working with physical therapy to optimize rotator cuff strength. We will try glenohumeral injection today given the articular sided nature of the tear as well as the presence of findings of bicep tendinitis. Under ultrasound guidance, cortisone injection successfully delivered into the glenohumeral joint and patient tolerated procedure well. She will work with physical therapy primarily on full active and passive range of motion exercises and rotator cuff strengthening exercises. Will see her back in 2 months for clinical recheck. Did recommend that she let me know how this injection does for her as there is a fairly high likelihood that some of her pain is referred from the cervical spine with radicular pain extending down into her wrist and hand over  the last 2 months in the same timeframe.   Since then, patient notes injection did not provide much relief.  Physical therapy was done for her shoulder as well and she finished this though she feels like therapy actually made her symptoms worse.  Since she has finished physical therapy she has been resting the arm and she feels like the rest has provided great relief for her.  Taking occasional aspirin and tramadol.  She states that overall since the last visit she is about 80% improved.  Does have some occasional right-sided neck and scapular pain with radiation down to the wrist at times.  She also has increased sensitivity in this distribution without numbness/tingling.                ROS: All systems reviewed are negative as they relate to the chief complaint within the history of present illness.  Patient denies fevers or chills.  Assessment & Plan: Visit Diagnoses:  1. Traumatic rupture of supraspinatus tendon of right shoulder, initial encounter     Plan: [name] is a 67 y.o. female who returns to the office for follow-up visit.  Plan from last visit was noted above in HPI.  They now return with 80% improvement since last visit.  Injection did not seem to be very helpful for her and she feels therapy made her symptoms worse.  Rest has been very helpful.  Does have a lot of symptoms that seem more related to her neck than her shoulder including  neck pain, scapular pain, hypersensitivity with radiation of pain down into her wrist all the way down the arm.  At this time, she does not want to do anything about it most recent imaging of the cervical spine is from October 2013 demonstrating asymmetric facet hypertrophy on the right which could contribute to right radiculopathy.  If she decides she wants to work up this radiating pain and hypersensitivity then neck step would be new radiographs and likely MRI cervical spine to follow that.  As of now, she is feeling pretty good and she will  follow-up as needed.  Follow-Up Instructions: No follow-ups on file.   Orders:  No orders of the defined types were placed in this encounter.  No orders of the defined types were placed in this encounter.     Procedures: No procedures performed   Clinical Data: No additional findings.  Objective: Vital Signs: There were no vitals taken for this visit.  Physical Exam:  Constitutional: Patient appears well-developed HEENT:  Head: Normocephalic Eyes:EOM are normal Neck: Normal range of motion Cardiovascular: Normal rate Pulmonary/chest: Effort normal Neurologic: Patient is alert Skin: Skin is warm Psychiatric: Patient has normal mood and affect  Ortho Exam: Ortho exam demonstrates right shoulder with full active and passive range of motion.  No coarseness or grinding with passive motion of the shoulder.  Intact EPL, FPL, finger abduction, pronation/supination, bicep, tricep, deltoid.  No significant pain with cervical spine range of motion.  Negative Spurling sign.  Negative Lhermitte sign.  Specialty Comments:  No specialty comments available.  Imaging: No results found.   PMFS History: Patient Active Problem List   Diagnosis Date Noted   Coronary artery disease involving native coronary artery of native heart without angina pectoris 12/23/2020   Type 2 diabetes mellitus with complication, without long-term current use of insulin (HCC) 12/23/2020   Hyperlipidemia 01/31/2014   Pain in limb 12/26/2013   Disturbance of skin sensation 12/26/2013   Peripheral arterial disease (HCC) 01/23/2013   Leukocytosis    Monocytosis    Acute MI, inferior wall, initial episode of care (HCC) 07/08/2012   Tobacco use disorder 07/08/2012   Fibromyalgia 07/08/2012   Depression 07/08/2012   Diabetes mellitus type II, uncontrolled (HCC) 07/08/2012   Past Medical History:  Diagnosis Date   Acute MI, inferior wall, initial episode of care (HCC) 07/08/2012   Cath-07/08/11 -distal  RCA stent DES (99%), LAD 20-30%. EF 50% inferior hypokinesis  (infarct)   Anxiety    Asthma    Coronary artery disease    Depression    Diabetes mellitus    Fibromyalgia    Leukocytosis    Monocytosis    Peripheral arterial disease (HCC) 01/23/2013   Tobacco use disorder 07/08/2012    Family History  Problem Relation Age of Onset   Kidney disease Mother    Diabetes Mother        DM Type II   Alzheimer's disease Mother    Diabetes Father        IDDM   Heart disease Father    Cancer Father        bone cancer died at 25   Cancer Sister        bone cancer at age 18   Multiple sclerosis Daughter    Diabetes Son 2       DM Type I   Heart disease Maternal Grandfather    Heart disease Paternal Grandmother    Diabetes Son 75  DM Type I    Past Surgical History:  Procedure Laterality Date   ABDOMINAL AORTAGRAM N/A 02/07/2013   Procedure: ABDOMINAL AORTAGRAM;  Surgeon: Iran Ouch, MD;  Location: [facility] CATH LAB;  Service: Cardiovascular;  Laterality: N/A;   CARDIAC CATHETERIZATION     CORONARY STENT PLACEMENT     CYST REMOVAL TRUNK  1977   tailbone   LEFT HEART CATHETERIZATION WITH CORONARY ANGIOGRAM N/A 07/07/2012   Procedure: LEFT HEART CATHETERIZATION WITH CORONARY ANGIOGRAM;  Surgeon: Kathleene Hazel, MD;  Location: [facility] CATH LAB;  Service: Cardiovascular;  Laterality: N/A;   PERCUTANEOUS CORONARY STENT INTERVENTION (PCI-S)  07/07/2012   Procedure: PERCUTANEOUS CORONARY STENT INTERVENTION (PCI-S);  Surgeon: Kathleene Hazel, MD;  Location: [facility] CATH LAB;  Service: Cardiovascular;;   TONSILLECTOMY     Social History   Occupational History   Not on file  Tobacco Use   Smoking status: Every Day    Current packs/day: 1.00    Average packs/day: 1 pack/day for 43.0 years (43.0 ttl pk-yrs)    Types: Cigarettes   Smokeless tobacco: Never  Substance and Sexual Activity   Alcohol use: No    Alcohol/week: 0.0 standard drinks of alcohol    Comment: Rarely    Drug use: No   Sexual activity: Yes

## 2023-07-19 ENCOUNTER — Other Ambulatory Visit: Payer: Self-pay | Admitting: Adult Health

## 2023-07-19 DIAGNOSIS — I10 Essential (primary) hypertension: Secondary | ICD-10-CM

## 2023-08-12 ENCOUNTER — Other Ambulatory Visit: Payer: Self-pay | Admitting: Adult Health

## 2023-08-12 DIAGNOSIS — M5412 Radiculopathy, cervical region: Secondary | ICD-10-CM

## 2023-08-28 ENCOUNTER — Other Ambulatory Visit: Payer: Self-pay | Admitting: Adult Health

## 2023-08-28 DIAGNOSIS — R0602 Shortness of breath: Secondary | ICD-10-CM | POA: Insufficient documentation

## 2023-08-28 NOTE — Progress Notes (Unsigned)
Cardiology Office Note:   Date:  08/31/2023  ID:  [name], DOB 07/24/1956, MRN [id number] PCP: Shirline Frees, NP   HeartCare Providers Cardiologist:  Rollene Rotunda, MD {  History of Present Illness:   [name] is a 67 y.o. female who presents for a history of CAD.  She had a stent to the PDA in 2013.  When I saw her last in 2022 she had SOB.  However, she had a negative perfusion study.  She continues to have dyspnea with exertion.  She unfortunately continues to smoke cigarettes.  She comes back because she is having worsening leg pain.  She in 2015 saw Dr. Kirke Corin.  She had severe right leg claudication and had right external iliac artery stenosis but she improved with medical management.  She is now getting bilateral leg pain walking in the 100 feet or less on level ground.  She is not having any resting pain.  She is not having any new substernal chest pressure, neck or arm discomfort.  She is not having any new palpitations, presyncope or syncope.   ROS: Positive for chronic bladder infections  Studies Reviewed:    EKG:   EKG Interpretation Date/Time:  Wednesday August 31 2023 15:53:08 EST Ventricular Rate:  70 PR Interval:  162 QRS Duration:  124 QT Interval:  400 QTC Calculation: 432 R Axis:   94  Text Interpretation: Normal sinus rhythm Right bundle branch block Non-specific ST-t changes When compared with ECG of 07-Feb-2013 08:02, Right bundle branch block is now Present Confirmed by Rollene Rotunda ([id number]) on 08/31/2023 4:26:29 PM     Risk Assessment/Calculations:              Physical Exam:   VS:  BP 114/62   Pulse 70   Ht 5' 3.5" (1.613 m)   Wt 143 lb (64.9 kg)   BMI 24.93 kg/m    Wt Readings from Last 3 Encounters:  08/31/23 143 lb (64.9 kg)  05/20/23 128 lb (58.1 kg)  04/13/23 131 lb (59.4 kg)     GEN: Well nourished, well developed in no acute distress NECK: No JVD;  carotid bruits CARDIAC: RRR, no murmurs, rubs,  gallops RESPIRATORY:   Decreased breath sounds bilaterally without wheezing or crackles ABDOMEN: Soft, non-tender, non-distended EXTREMITIES:  No edema; No deformity.  Absent dorsalis pedis and posterior tibialis.  ASSESSMENT AND PLAN:   CAD:    She is having no new symptoms since her stress test couple years ago.  I am not suspecting any acute coronary symptoms but suggesting her shortness of breath is probably pulmonary.   DM: Her A1c was 7.6 which is improved from 8.3.  I will defer to Shirline Frees, NP   DYSPNEA: She apparently had abnormal pulmonary function testing years ago with the [place] he never followed up with pulmonary.   I will refer her to pulmonary.  We talked about the need to stop smoking.  PDA: I will refer her back to Dr. Kirke Corin and I will also order ABIs.  BRIUT: She will get carotid Doppler.  DYSLIPIDEMIA: She does not want to take statins.  She has not been taking the pravastatin but does agree to go back on 40 mg of this.  TOBACCO ABUSE: We again talked about the need to stop smoking.  She is trying and if she cannot quit cold [place] which is her plan I will talk with her in the future about Chantix.     Follow up with me in  one year.   Signed, Rollene Rotunda, MD

## 2023-08-30 ENCOUNTER — Other Ambulatory Visit: Payer: Self-pay | Admitting: Adult Health

## 2023-08-30 DIAGNOSIS — I2119 ST elevation (STEMI) myocardial infarction involving other coronary artery of inferior wall: Secondary | ICD-10-CM

## 2023-08-30 DIAGNOSIS — E118 Type 2 diabetes mellitus with unspecified complications: Secondary | ICD-10-CM

## 2023-08-30 DIAGNOSIS — R569 Unspecified convulsions: Secondary | ICD-10-CM

## 2023-08-31 ENCOUNTER — Other Ambulatory Visit: Payer: Self-pay | Admitting: *Deleted

## 2023-08-31 ENCOUNTER — Encounter: Payer: Self-pay | Admitting: Cardiology

## 2023-08-31 ENCOUNTER — Ambulatory Visit (INDEPENDENT_AMBULATORY_CARE_PROVIDER_SITE_OTHER): Payer: Medicare Other | Admitting: Cardiology

## 2023-08-31 VITALS — BP 114/62 | HR 70 | Ht 63.5 in | Wt 143.0 lb

## 2023-08-31 DIAGNOSIS — R0602 Shortness of breath: Secondary | ICD-10-CM | POA: Diagnosis not present

## 2023-08-31 DIAGNOSIS — I739 Peripheral vascular disease, unspecified: Secondary | ICD-10-CM

## 2023-08-31 DIAGNOSIS — E782 Mixed hyperlipidemia: Secondary | ICD-10-CM

## 2023-08-31 DIAGNOSIS — I2510 Atherosclerotic heart disease of native coronary artery without angina pectoris: Secondary | ICD-10-CM | POA: Diagnosis not present

## 2023-08-31 DIAGNOSIS — E118 Type 2 diabetes mellitus with unspecified complications: Secondary | ICD-10-CM | POA: Diagnosis not present

## 2023-08-31 DIAGNOSIS — Z79899 Other long term (current) drug therapy: Secondary | ICD-10-CM

## 2023-08-31 MED ORDER — PRAVASTATIN SODIUM 40 MG PO TABS
40.0000 mg | ORAL_TABLET | Freq: Every day | ORAL | 3 refills | Status: DC
Start: 2023-08-31 — End: 2023-10-25

## 2023-08-31 NOTE — Patient Instructions (Signed)
Medication Instructions:  The current medical regimen is effective;  continue present plan and medications.  *If you need a refill on your cardiac medications before your next appointment, please call your pharmacy*   Lab Work: Please have blood work at your closest [facility]  ([place])  If you have labs (blood work) drawn today and your tests are completely normal, you will receive your results only by: MyChart Message (if you have MyChart) OR A paper copy in the mail If you have any lab test that is abnormal or we need to change your treatment, we will call you to review the results.   Testing/Procedures: Your physician has requested that you have a carotid duplex. This test is an ultrasound of the carotid arteries in your neck. It looks at blood flow through these arteries that supply the brain with blood. Allow one hour for this exam. There are no restrictions or special instructions.  Your physician has requested that you have a lower extremity arterial exercise duplex. During this test, exercise and ultrasound are used to evaluate arterial blood flow in the legs. Allow one hour for this exam. There are no restrictions or special instructions.  Both of the above tests will be completed at [facility].  You have been referred to Dr Kirke Corin at our [facility] office for evaluation of PVD.  You have been referred to Pulmonary for the evaluation of shortness of breath.  You will be contacted to be scheduled for the above appointments.  Follow-Up: At [facility], you and your health needs are our priority.  As part of our continuing mission to provide you with exceptional heart care, we have created designated Provider Care Teams.  These Care Teams include your primary Cardiologist (physician) and Advanced Practice Providers (APPs -  Physician Assistants and Nurse Practitioners) who all work together to provide you with the care you need, when you need it.  We recommend signing  up for the patient portal called "MyChart".  Sign up information is provided on this After Visit Summary.  MyChart is used to connect with patients for Virtual Visits (Telemedicine).  Patients are able to view lab/test results, encounter notes, upcoming appointments, etc.  Non-urgent messages can be sent to your provider as well.   To learn more about what you can do with MyChart, go to [email or web address].    Your next appointment:   1 year(s)  Provider:   Rollene Rotunda, MD

## 2023-09-12 ENCOUNTER — Encounter: Payer: Self-pay | Admitting: *Deleted

## 2023-09-16 ENCOUNTER — Ambulatory Visit (HOSPITAL_COMMUNITY): Admission: RE | Admit: 2023-09-16 | Payer: Medicare Other | Source: Ambulatory Visit

## 2023-09-16 ENCOUNTER — Ambulatory Visit (HOSPITAL_COMMUNITY): Payer: Medicare Other

## 2023-09-22 ENCOUNTER — Ambulatory Visit (HOSPITAL_COMMUNITY)
Admission: RE | Admit: 2023-09-22 | Discharge: 2023-09-22 | Disposition: A | Discharge status: Home / Self Care | Payer: Medicare Other | Source: Ambulatory Visit | Attending: Cardiology | Admitting: Cardiology

## 2023-09-22 DIAGNOSIS — I6523 Occlusion and stenosis of bilateral carotid arteries: Secondary | ICD-10-CM | POA: Diagnosis not present

## 2023-09-22 DIAGNOSIS — I739 Peripheral vascular disease, unspecified: Secondary | ICD-10-CM | POA: Insufficient documentation

## 2023-09-22 DIAGNOSIS — R42 Dizziness and giddiness: Secondary | ICD-10-CM | POA: Diagnosis not present

## 2023-10-16 ENCOUNTER — Other Ambulatory Visit: Payer: Self-pay | Admitting: Adult Health

## 2023-10-16 DIAGNOSIS — I10 Essential (primary) hypertension: Secondary | ICD-10-CM

## 2023-10-18 ENCOUNTER — Encounter: Payer: Self-pay | Admitting: Cardiovascular Disease

## 2023-10-18 ENCOUNTER — Other Ambulatory Visit: Payer: Self-pay | Admitting: Cardiology

## 2023-10-18 ENCOUNTER — Ambulatory Visit: Payer: Medicare Other | Attending: Cardiovascular Disease | Admitting: Cardiovascular Disease

## 2023-10-18 VITALS — BP 128/64 | HR 76 | Ht 63.0 in | Wt 149.6 lb

## 2023-10-18 DIAGNOSIS — I10 Essential (primary) hypertension: Secondary | ICD-10-CM

## 2023-10-18 DIAGNOSIS — Z720 Tobacco use: Secondary | ICD-10-CM

## 2023-10-18 DIAGNOSIS — E785 Hyperlipidemia, unspecified: Secondary | ICD-10-CM | POA: Diagnosis not present

## 2023-10-18 DIAGNOSIS — E782 Mixed hyperlipidemia: Secondary | ICD-10-CM | POA: Diagnosis not present

## 2023-10-18 DIAGNOSIS — I2510 Atherosclerotic heart disease of native coronary artery without angina pectoris: Secondary | ICD-10-CM

## 2023-10-18 DIAGNOSIS — Z79899 Other long term (current) drug therapy: Secondary | ICD-10-CM | POA: Diagnosis not present

## 2023-10-18 DIAGNOSIS — I739 Peripheral vascular disease, unspecified: Secondary | ICD-10-CM | POA: Diagnosis not present

## 2023-10-18 NOTE — H&P (View-Only) (Signed)
Cardiology Office Note   Date:  10/19/2023   ID:  Kamani, Magnussen 06-22-1956, MRN [id number]  PCP:  Shirline Frees, NP  Cardiologist:   Lorine Bears, MD   No chief complaint on file.     History of Present Illness: [name] is a 68 y.o. female who was referred by Dr. Antoine Poche for evaluation and management of peripheral arterial disease. She has known history of coronary artery disease status post stent to the PDA in 2013, type 2 diabetes, tobacco use and hyperlipidemia. She was seen by me in 2014 for claudication.  She underwent angiography which showed moderate distal right external iliac artery stenosis with severe spasm that improved with nitroglycerin.  No infrainguinal disease. No revascularization was performed. I have not seen her since 2015 and she now returns with significant worsening of bilateral calf claudication that now goes to her hips.  Symptoms are slightly worse on the right than the left.  She is only able to walk a short distance before having to stop and rest. She underwent noninvasive vascular studies in December which showed an ABI of 0.64 on the right and 0.86 on the left. She continues to smoke half a pack per day but is trying to decrease.  She denies chest pain but does have significant exertional dyspnea.  Past Medical History:  Diagnosis Date   Acute MI, inferior wall, initial episode of care (HCC) 07/08/2012   Cath-07/08/11 -distal RCA stent DES (99%), LAD 20-30%. EF 50% inferior hypokinesis  (infarct)   Anxiety    Asthma    Coronary artery disease    Depression    Diabetes mellitus    Fibromyalgia    Leukocytosis    Monocytosis    Peripheral arterial disease (HCC) 01/23/2013   Tobacco use disorder 07/08/2012    Past Surgical History:  Procedure Laterality Date   ABDOMINAL AORTAGRAM N/A 02/07/2013   Procedure: ABDOMINAL Ronny Flurry;  Surgeon: Iran Ouch, MD;  Location: [facility] CATH LAB;  Service: Cardiovascular;  Laterality: N/A;    CARDIAC CATHETERIZATION     CORONARY STENT PLACEMENT     CYST REMOVAL TRUNK  1977   tailbone   LEFT HEART CATHETERIZATION WITH CORONARY ANGIOGRAM N/A 07/07/2012   Procedure: LEFT HEART CATHETERIZATION WITH CORONARY ANGIOGRAM;  Surgeon: Kathleene Hazel, MD;  Location: [facility] CATH LAB;  Service: Cardiovascular;  Laterality: N/A;   PERCUTANEOUS CORONARY STENT INTERVENTION (PCI-S)  07/07/2012   Procedure: PERCUTANEOUS CORONARY STENT INTERVENTION (PCI-S);  Surgeon: Kathleene Hazel, MD;  Location: [facility] CATH LAB;  Service: Cardiovascular;;   TONSILLECTOMY       Current Outpatient Medications  Medication Sig Dispense Refill   amLODipine (NORVASC) 5 MG tablet Take 1 tablet by mouth once daily 90 tablet 0   busPIRone (BUSPAR) 15 MG tablet TAKE 1 TABLET BY MOUTH THREE TIMES DAILY 270 tablet 0   Cholecalciferol (VITAMIN D3) 2000 units TABS Take 1 tablet by mouth daily.     cilostazol (PLETAL) 100 MG tablet Take 0.5 tablets (50 mg total) by mouth 2 (two) times daily. 90 tablet 3   Continuous Blood Gluc Receiver (DEXCOM G7 RECEIVER) DEVI Use with Dexcom G7 sensory 1 each 0   Continuous Glucose Sensor (DEXCOM G7 SENSOR) MISC USE AS DIRECTED EVERY 10 DAYS 3 each 0   FLUoxetine (PROZAC) 40 MG capsule Take 1 capsule by mouth once daily 90 capsule 0   glipiZIDE (GLUCOTROL XL) 10 MG 24 hr tablet Take 1 tablet by mouth once daily with breakfast  90 tablet 0   isosorbide mononitrate (IMDUR) 60 MG 24 hr tablet TAKE 1 & 1/2 (ONE & ONE-HALF) TABLETS BY MOUTH ONCE DAILY 135 tablet 0   levETIRAcetam (KEPPRA) 250 MG tablet TAKE 1 TABLET BY MOUTH THREE TIMES DAILY 270 tablet 0   levofloxacin (LEVAQUIN) 500 MG tablet Take 1 tablet (500 mg total) by mouth daily. 7 tablet 0   nitroGLYCERIN (NITROSTAT) 0.4 MG SL tablet Place 1 tablet (0.4 mg total) under the tongue every 5 (five) minutes x 3 doses as needed for chest pain. 30 tablet 12   nystatin cream (MYCOSTATIN) Apply 1 application topically 2 (two) times  daily. 30 g 2   pravastatin (PRAVACHOL) 40 MG tablet Take 1 tablet (40 mg total) by mouth at bedtime. 90 tablet 3   pregabalin (LYRICA) 150 MG capsule Take 1 capsule by mouth twice daily 60 capsule 2   LANTUS SOLOSTAR 100 UNIT/ML Solostar Pen Inject 10 Units into the skin 2 (two) times daily. (Patient taking differently: Inject 8 Units into the skin 2 (two) times daily.) 6 mL 2   nitrofurantoin, macrocrystal-monohydrate, (MACROBID) 100 MG capsule Take 1 capsule (100 mg total) by mouth at bedtime. Start after Levaquin (Patient not taking: Reported on 10/18/2023) 90 capsule 3   propranolol (INDERAL) 40 MG tablet Take 1 tablet by mouth twice daily 180 tablet 0   No current facility-administered medications for this visit.    Allergies:   Erythromycin, Statins, Ace inhibitors, Cymbalta [duloxetine hcl], Doxycycline, Jardiance [empagliflozin], and Fentanyl    Social History:  The patient  reports that she has been smoking cigarettes. She has a 43 pack-year smoking history. She has never used smokeless tobacco. She reports that she does not drink alcohol and does not use drugs.   Family History:  The patient's family history includes Alzheimer's disease in her mother; Cancer in her father and sister; Diabetes in her father and mother; Diabetes (age of onset: 34) in her son; Diabetes (age of onset: 2) in her son; Heart disease in her father, maternal grandfather, and paternal grandmother; Kidney disease in her mother; Multiple sclerosis in her daughter.    ROS:  Please see the history of present illness.   Otherwise, review of systems are positive for none.   All other systems are reviewed and negative.    PHYSICAL EXAM: VS:  BP 128/64 (BP Location: Left Arm, Patient Position: Sitting)   Pulse 76   Ht 5\' 3"  (1.6 m)   Wt 149 lb 9.6 oz (67.9 kg)   SpO2 98%   BMI 26.50 kg/m  , BMI Body mass index is 26.5 kg/m. GEN: Well nourished, well developed, in no acute distress  HEENT: normal  Neck: no  JVD, carotid bruits, or masses Cardiac: RRR; no murmurs, rubs, or gallops,no edema  Respiratory:  clear to auscultation bilaterally, normal work of breathing GI: soft, nontender, nondistended, + BS MS: no deformity or atrophy  Skin: warm and dry, no rash Neuro:  Strength and sensation are intact Psych: euthymic mood, full affect Vascular: Femoral pulses not palpable on the right and +1 on the left.  Distal pulses are not palpable.   EKG:  EKG is not ordered today.    Recent Labs: 02/23/2023: ALT 7 10/18/2023: BUN 28; Creatinine, Ser 1.38; Hemoglobin 14.7; Platelets 309; Potassium 4.7; Sodium 142    Lipid Panel    Component Value Date/Time   CHOL 226 (H) 10/18/2023 1211   TRIG 233 (H) 10/18/2023 1211   HDL 50 10/18/2023 1211  CHOLHDL 4.5 (H) 10/18/2023 1211   CHOLHDL 3 02/24/2022 1508   VLDL 53.8 (H) 02/24/2022 1508   LDLCALC 134 (H) 10/18/2023 1211   LDLDIRECT 72.0 02/24/2022 1508      Wt Readings from Last 3 Encounters:  10/18/23 149 lb 9.6 oz (67.9 kg)  08/31/23 143 lb (64.9 kg)  05/20/23 128 lb (58.1 kg)           No data to display            ASSESSMENT AND PLAN:  1.  Peripheral arterial disease with severe bilateral leg claudication due to suspected inflow disease worse on the right side.  She has not been able to do much physical activities due to her symptoms.  This is in spite of being on cilostazol.  Given lifestyle limiting claudication and severity of her symptoms, I recommend proceeding with abdominal aortogram with lower extremity angiography and possible endovascular intervention.  I discussed the procedure in details as well as risks and benefits. Planned access is via the left common femoral artery. She is allergic to fentanyl and we will plan on using Versed without narcotics.  2.  Coronary artery disease involving native coronary arteries without angina: No chest pain.  Continue medical therapy.  3.  Tobacco use: I discussed the importance  of smoking cessation.  4.  Essential hypertension: Blood pressure is controlled on current medications.  5.  Hyperlipidemia: Currently on pravastatin.  Most recent lipid profile showed an LDL of 69.    Disposition: Proceed with angiography and follow-up after.  Signed,  Lorine Bears, MD  10/19/2023 11:58 AM    [facility]

## 2023-10-18 NOTE — Progress Notes (Unsigned)
Cardiology Office Note   Date:  10/19/2023   ID:  Cambrie, Hurrell [birth year], MRN [id number]  PCP:  Shirline Frees, NP  Cardiologist:   Lorine Bears, MD   No chief complaint on file.     History of Present Illness: [name] is a 68 y.o. female who was referred by Dr. Antoine Poche for evaluation and management of peripheral arterial disease. She has known history of coronary artery disease status post stent to the PDA in 2013, type 2 diabetes, tobacco use and hyperlipidemia. She was seen by me in 2014 for claudication.  She underwent angiography which showed moderate distal right external iliac artery stenosis with severe spasm that improved with nitroglycerin.  No infrainguinal disease. No revascularization was performed. I have not seen her since 2015 and she now returns with significant worsening of bilateral calf claudication that now goes to her hips.  Symptoms are slightly worse on the right than the left.  She is only able to walk a short distance before having to stop and rest. She underwent noninvasive vascular studies in December which showed an ABI of 0.64 on the right and 0.86 on the left. She continues to smoke half a pack per day but is trying to decrease.  She denies chest pain but does have significant exertional dyspnea.  Past Medical History:  Diagnosis Date   Acute MI, inferior wall, initial episode of care (HCC) 07/08/2012   Cath-07/08/11 -distal RCA stent DES (99%), LAD 20-30%. EF 50% inferior hypokinesis  (infarct)   Anxiety    Asthma    Coronary artery disease    Depression    Diabetes mellitus    Fibromyalgia    Leukocytosis    Monocytosis    Peripheral arterial disease (HCC) 01/23/2013   Tobacco use disorder 07/08/2012    Past Surgical History:  Procedure Laterality Date   ABDOMINAL AORTAGRAM N/A 02/07/2013   Procedure: ABDOMINAL Ronny Flurry;  Surgeon: Iran Ouch, MD;  Location: [facility] CATH LAB;  Service: Cardiovascular;  Laterality: N/A;    CARDIAC CATHETERIZATION     CORONARY STENT PLACEMENT     CYST REMOVAL TRUNK  1977   tailbone   LEFT HEART CATHETERIZATION WITH CORONARY ANGIOGRAM N/A 07/07/2012   Procedure: LEFT HEART CATHETERIZATION WITH CORONARY ANGIOGRAM;  Surgeon: Kathleene Hazel, MD;  Location: [facility] CATH LAB;  Service: Cardiovascular;  Laterality: N/A;   PERCUTANEOUS CORONARY STENT INTERVENTION (PCI-S)  07/07/2012   Procedure: PERCUTANEOUS CORONARY STENT INTERVENTION (PCI-S);  Surgeon: Kathleene Hazel, MD;  Location: [facility] CATH LAB;  Service: Cardiovascular;;   TONSILLECTOMY       Current Outpatient Medications  Medication Sig Dispense Refill   amLODipine (NORVASC) 5 MG tablet Take 1 tablet by mouth once daily 90 tablet 0   busPIRone (BUSPAR) 15 MG tablet TAKE 1 TABLET BY MOUTH THREE TIMES DAILY 270 tablet 0   Cholecalciferol (VITAMIN D3) 2000 units TABS Take 1 tablet by mouth daily.     cilostazol (PLETAL) 100 MG tablet Take 0.5 tablets (50 mg total) by mouth 2 (two) times daily. 90 tablet 3   Continuous Blood Gluc Receiver (DEXCOM G7 RECEIVER) DEVI Use with Dexcom G7 sensory 1 each 0   Continuous Glucose Sensor (DEXCOM G7 SENSOR) MISC USE AS DIRECTED EVERY 10 DAYS 3 each 0   FLUoxetine (PROZAC) 40 MG capsule Take 1 capsule by mouth once daily 90 capsule 0   glipiZIDE (GLUCOTROL XL) 10 MG 24 hr tablet Take 1 tablet by mouth once daily with breakfast  90 tablet 0   isosorbide mononitrate (IMDUR) 60 MG 24 hr tablet TAKE 1 & 1/2 (ONE & ONE-HALF) TABLETS BY MOUTH ONCE DAILY 135 tablet 0   levETIRAcetam (KEPPRA) 250 MG tablet TAKE 1 TABLET BY MOUTH THREE TIMES DAILY 270 tablet 0   levofloxacin (LEVAQUIN) 500 MG tablet Take 1 tablet (500 mg total) by mouth daily. 7 tablet 0   nitroGLYCERIN (NITROSTAT) 0.4 MG SL tablet Place 1 tablet (0.4 mg total) under the tongue every 5 (five) minutes x 3 doses as needed for chest pain. 30 tablet 12   nystatin cream (MYCOSTATIN) Apply 1 application topically 2 (two) times  daily. 30 g 2   pravastatin (PRAVACHOL) 40 MG tablet Take 1 tablet (40 mg total) by mouth at bedtime. 90 tablet 3   pregabalin (LYRICA) 150 MG capsule Take 1 capsule by mouth twice daily 60 capsule 2   LANTUS SOLOSTAR 100 UNIT/ML Solostar Pen Inject 10 Units into the skin 2 (two) times daily. (Patient taking differently: Inject 8 Units into the skin 2 (two) times daily.) 6 mL 2   nitrofurantoin, macrocrystal-monohydrate, (MACROBID) 100 MG capsule Take 1 capsule (100 mg total) by mouth at bedtime. Start after Levaquin (Patient not taking: Reported on 10/18/2023) 90 capsule 3   propranolol (INDERAL) 40 MG tablet Take 1 tablet by mouth twice daily 180 tablet 0   No current facility-administered medications for this visit.    Allergies:   Erythromycin, Statins, Ace inhibitors, Cymbalta [duloxetine hcl], Doxycycline, Jardiance [empagliflozin], and Fentanyl    Social History:  The patient  reports that she has been smoking cigarettes. She has a 43 pack-year smoking history. She has never used smokeless tobacco. She reports that she does not drink alcohol and does not use drugs.   Family History:  The patient's family history includes Alzheimer's disease in her mother; Cancer in her father and sister; Diabetes in her father and mother; Diabetes (age of onset: 49) in her son; Diabetes (age of onset: 2) in her son; Heart disease in her father, maternal grandfather, and paternal grandmother; Kidney disease in her mother; Multiple sclerosis in her daughter.    ROS:  Please see the history of present illness.   Otherwise, review of systems are positive for none.   All other systems are reviewed and negative.    PHYSICAL EXAM: VS:  BP 128/64 (BP Location: Left Arm, Patient Position: Sitting)   Pulse 76   Ht 5\' 3"  (1.6 m)   Wt 149 lb 9.6 oz (67.9 kg)   SpO2 98%   BMI 26.50 kg/m  , BMI Body mass index is 26.5 kg/m. GEN: Well nourished, well developed, in no acute distress  HEENT: normal  Neck: no  JVD, carotid bruits, or masses Cardiac: RRR; no murmurs, rubs, or gallops,no edema  Respiratory:  clear to auscultation bilaterally, normal work of breathing GI: soft, nontender, nondistended, + BS MS: no deformity or atrophy  Skin: warm and dry, no rash Neuro:  Strength and sensation are intact Psych: euthymic mood, full affect Vascular: Femoral pulses not palpable on the right and +1 on the left.  Distal pulses are not palpable.   EKG:  EKG is not ordered today.    Recent Labs: 02/23/2023: ALT 7 10/18/2023: BUN 28; Creatinine, Ser 1.38; Hemoglobin 14.7; Platelets 309; Potassium 4.7; Sodium 142    Lipid Panel    Component Value Date/Time   CHOL 226 (H) 10/18/2023 1211   TRIG 233 (H) 10/18/2023 1211   HDL 50 10/18/2023 1211  CHOLHDL 4.5 (H) 10/18/2023 1211   CHOLHDL 3 02/24/2022 1508   VLDL 53.8 (H) 02/24/2022 1508   LDLCALC 134 (H) 10/18/2023 1211   LDLDIRECT 72.0 02/24/2022 1508      Wt Readings from Last 3 Encounters:  10/18/23 149 lb 9.6 oz (67.9 kg)  08/31/23 143 lb (64.9 kg)  05/20/23 128 lb (58.1 kg)           No data to display            ASSESSMENT AND PLAN:  1.  Peripheral arterial disease with severe bilateral leg claudication due to suspected inflow disease worse on the right side.  She has not been able to do much physical activities due to her symptoms.  This is in spite of being on cilostazol.  Given lifestyle limiting claudication and severity of her symptoms, I recommend proceeding with abdominal aortogram with lower extremity angiography and possible endovascular intervention.  I discussed the procedure in details as well as risks and benefits. Planned access is via the left common femoral artery. She is allergic to fentanyl and we will plan on using Versed without narcotics.  2.  Coronary artery disease involving native coronary arteries without angina: No chest pain.  Continue medical therapy.  3.  Tobacco use: I discussed the importance  of smoking cessation.  4.  Essential hypertension: Blood pressure is controlled on current medications.  5.  Hyperlipidemia: Currently on pravastatin.  Most recent lipid profile showed an LDL of 69.    Disposition: Proceed with angiography and follow-up after.  Signed,  Lorine Bears, MD  10/19/2023 11:58 AM    [facility]

## 2023-10-18 NOTE — Patient Instructions (Addendum)
Medication Instructions:  No changes *If you need a refill on your cardiac medications before your next appointment, please call your pharmacy*   Lab Work: Your provider would like for you to have the following labs today: CBC and BMET  If you have labs (blood work) drawn today and your tests are completely normal, you will receive your results only by: MyChart Message (if you have MyChart) OR A paper copy in the mail If you have any lab test that is abnormal or we need to change your treatment, we will call you to review the results.   Testing/Procedures: Your physician has requested that you have a peripheral vascular angiogram. This exam is performed at the hospital. During this exam IV contrast is used to look at arterial blood flow. Please review the information sheet given for details.    Follow-Up: At [facility], you and your health needs are our priority.  As part of our continuing mission to provide you with exceptional heart care, we have created designated Provider Care Teams.  These Care Teams include your primary Cardiologist (physician) and Advanced Practice Providers (APPs -  Physician Assistants and Nurse Practitioners) who all work together to provide you with the care you need, when you need it.  We recommend signing up for the patient portal called "MyChart".  Sign up information is provided on this After Visit Summary.  MyChart is used to connect with patients for Virtual Visits (Telemedicine).  Patients are able to view lab/test results, encounter notes, upcoming appointments, etc.  Non-urgent messages can be sent to your provider as well.   To learn more about what you can do with MyChart, go to [email or web address].    Your next appointment:   Keep your follow up on March 4th at 10:40 with Dr. Kirke Corin Other Instructions  [facility] A DEPT OF [facility] [place]. [facility] AT [facility] [address] [place]  [address] [place] [address] Dept: [phone] Loc: [phone]  Kelie Whitsett  10/18/2023  You are scheduled for a Peripheral Angiogram on Wednesday, February 12 with Dr. Lorine Bears.  1. Please arrive at the [facility] (Main Entrance A) at [facility] [address] [place], [place] [address] at 11:00 AM (This time is 2 hour(s) before your procedure to ensure your preparation).   Free valet parking service is available. You will check in at ADMITTING. The support person will be asked to wait in the waiting room.  It is OK to have someone drop you off and come back when you are ready to be discharged.    Special note: Every effort is made to have your procedure done on time. Please understand that emergencies sometimes delay scheduled procedures.  2. Diet: Do not eat solid foods after midnight.  The patient may have clear liquids until 5am upon the day of the procedure.  3. Labs: You will need to have blood drawn on 10/18/23. You do not need to be fasting.  4. Medication instructions in preparation for your procedure: Hold all diabetic medication the morning of the procedure  -take half the dose of the Lantus the night before the procedure   On the morning of your procedure, take your Aspirin 81 mg and any morning medicines NOT listed above.  You may use sips of water.  5. Plan to go home the same day, you will only stay overnight if medically necessary. 6. Bring a current list of your medications and current insurance  cards. 7. You MUST have a responsible person to drive you home. 8. Someone MUST be with you the first 24 hours after you arrive home or your discharge will be delayed. 9. Please wear clothes that are easy to get on and off and wear slip-on shoes.  Thank you for allowing [place] to care for you!   -- [facility]  Managing the Challenge of Quitting Smoking Quitting smoking is a physical and mental challenge. You may have  cravings, withdrawal symptoms, and temptation to smoke. Before quitting, work with your health care provider to make a plan that can help you manage quitting. Making a plan before you quit may keep you from smoking when you have the urge to smoke while trying to quit. How to manage lifestyle changes Managing stress Stress can make you want to smoke, and wanting to smoke may cause stress. It is important to find ways to manage your stress. You could try some of the following: Practice relaxation techniques. Breathe slowly and deeply, in through your nose and out through your mouth. Listen to music. Soak in a bath or take a shower. Imagine a peaceful place or vacation. Get some support. Talk with family or friends about your stress. Join a support group. Talk with a counselor or therapist. Get some physical activity. Go for a walk, run, or bike ride. Play a favorite sport. Practice yoga.  Medicines Talk with your health care provider about medicines that might help you deal with cravings and make quitting easier for you. Relationships Social situations can be difficult when you are quitting smoking. To manage this, you can: Avoid parties and other social situations where people might be smoking. Avoid alcohol. Leave right away if you have the urge to smoke. Explain to your family and friends that you are quitting smoking. Ask for support and let them know you might be a bit grumpy. Plan activities where smoking is not an option. General instructions Be aware that many people gain weight after they quit smoking. However, not everyone does. To keep from gaining weight, have a plan in place before you quit, and stick to the plan after you quit. Your plan should include: Eating healthy snacks. When you have a craving, it may help to: Eat popcorn, or try carrots, celery, or other cut vegetables. Chew sugar-free gum. Changing how you eat. Eat small portion sizes at meals. Eat 4-6 small  meals throughout the day instead of 1-2 large meals a day. Be mindful when you eat. You should avoid watching television or doing other things that might distract you as you eat. Exercising regularly. Make time to exercise each day. If you do not have time for a long workout, do short bouts of exercise for 5-10 minutes several times a day. Do some form of strengthening exercise, such as weight lifting. Do some exercise that gets your heart beating and causes you to breathe deeply, such as walking fast, running, swimming, or biking. This is very important. Drinking plenty of water or other low-calorie or no-calorie drinks. Drink enough fluid to keep your urine pale yellow.  How to recognize withdrawal symptoms Your body and mind may experience discomfort as you try to get used to not having nicotine in your system. These effects are called withdrawal symptoms. They may include: Feeling hungrier than normal. Having trouble concentrating. Feeling irritable or restless. Having trouble sleeping. Feeling depressed. Craving a cigarette. These symptoms may surprise you, but they are normal to have when  quitting smoking. To manage withdrawal symptoms: Avoid places, people, and activities that trigger your cravings. Remember why you want to quit. Get plenty of sleep. Avoid coffee and other drinks that contain caffeine. These may worsen some of your symptoms. How to manage cravings Come up with a plan for how to deal with your cravings. The plan should include the following: A definition of the specific situation you want to deal with. An activity or action you will take to replace smoking. A clear idea for how this action will help. The name of someone who could help you with this. Cravings usually last for 5-10 minutes. Consider taking the following actions to help you with your plan to deal with cravings: Keep your mouth busy. Chew sugar-free gum. Suck on hard candies or a straw. Brush your  teeth. Keep your hands and body busy. Change to a different activity right away. Squeeze or play with a ball. Do an activity or a hobby, such as making bead jewelry, practicing needlepoint, or working with wood. Mix up your normal routine. Take a short exercise break. Go for a quick walk, or run up and down stairs. Focus on doing something kind or helpful for someone else. Call a friend or family member to talk during a craving. Join a support group. Contact a quitline. Where to find support To get help or find a support group: Call the National Cancer Institute's Smoking Quitline: 1-800-QUIT-NOW 267-260-0251) Text QUIT to SmokefreeTXT: 454098 Where to find more information Visit these websites to find more information on quitting smoking: U.S. Department of Health and Human Services: www.smokefree.gov American Lung Association: www.freedomfromsmoking.org Centers for Disease Control and Prevention (CDC): FootballExhibition.com.br American Heart Association: www.heart.org Contact a health care provider if: You want to change your plan for quitting. The medicines you are taking are not helping. Your eating feels out of control or you cannot sleep. You feel depressed or become very anxious. Summary Quitting smoking is a physical and mental challenge. You will face cravings, withdrawal symptoms, and temptation to smoke again. Preparation can help you as you go through these challenges. Try different techniques to manage stress, handle social situations, and prevent weight gain. You can deal with cravings by keeping your mouth busy (such as by chewing gum), keeping your hands and body busy, calling family or friends, or contacting a quitline for people who want to quit smoking. You can deal with withdrawal symptoms by avoiding places where people smoke, getting plenty of rest, and avoiding drinks that contain caffeine. This information is not intended to replace advice given to you by your health care  provider. Make sure you discuss any questions you have with your health care provider. Document Revised: 09/04/2021 Document Reviewed: 09/04/2021 Elsevier Patient Education  2024 [employer].

## 2023-10-19 LAB — BASIC METABOLIC PANEL
BUN/Creatinine Ratio: 20 (ref 12–28)
BUN: 28 mg/dL — ABNORMAL HIGH (ref 8–27)
CO2: 19 mmol/L — ABNORMAL LOW (ref 20–29)
Calcium: 10.4 mg/dL — ABNORMAL HIGH (ref 8.7–10.3)
Chloride: 106 mmol/L (ref 96–106)
Creatinine, Ser: 1.38 mg/dL — ABNORMAL HIGH (ref 0.57–1.00)
Glucose: 75 mg/dL (ref 70–99)
Potassium: 4.7 mmol/L (ref 3.5–5.2)
Sodium: 142 mmol/L (ref 134–144)
eGFR: 42 mL/min/{1.73_m2} — ABNORMAL LOW (ref >59)

## 2023-10-19 LAB — LIPID PANEL
Chol/HDL Ratio: 4.5 {ratio} — ABNORMAL HIGH (ref 0.0–4.4)
Cholesterol, Total: 226 mg/dL — ABNORMAL HIGH (ref 100–199)
HDL: 50 mg/dL (ref >39)
LDL Chol Calc (NIH): 134 mg/dL — ABNORMAL HIGH (ref 0–99)
Triglycerides: 233 mg/dL — ABNORMAL HIGH (ref 0–149)
VLDL Cholesterol Cal: 42 mg/dL — ABNORMAL HIGH (ref 5–40)

## 2023-10-19 LAB — CBC
Hematocrit: 44.6 % (ref 34.0–46.6)
Hemoglobin: 14.7 g/dL (ref 11.1–15.9)
MCH: 31.2 pg (ref 26.6–33.0)
MCHC: 33 g/dL (ref 31.5–35.7)
MCV: 95 fL (ref 79–97)
Platelets: 309 10*3/uL (ref 150–450)
RBC: 4.71 x10E6/uL (ref 3.77–5.28)
RDW: 12.2 % (ref 11.7–15.4)
WBC: 14.8 10*3/uL — ABNORMAL HIGH (ref 3.4–10.8)

## 2023-10-20 ENCOUNTER — Encounter: Payer: Self-pay | Admitting: *Deleted

## 2023-10-20 ENCOUNTER — Other Ambulatory Visit: Payer: Self-pay | Admitting: *Deleted

## 2023-10-20 DIAGNOSIS — Z79899 Other long term (current) drug therapy: Secondary | ICD-10-CM

## 2023-10-20 DIAGNOSIS — E785 Hyperlipidemia, unspecified: Secondary | ICD-10-CM

## 2023-10-25 ENCOUNTER — Telehealth: Payer: Self-pay | Admitting: Cardiology

## 2023-10-25 MED ORDER — PRAVASTATIN SODIUM 80 MG PO TABS: 80.0000 mg | ORAL_TABLET | Freq: Every evening | ORAL | 3 refills | Status: AC

## 2023-10-25 MED ORDER — NITROGLYCERIN 0.4 MG SL SUBL: 0.4000 mg | SUBLINGUAL_TABLET | SUBLINGUAL | 3 refills | Status: AC | PRN

## 2023-10-25 NOTE — Telephone Encounter (Signed)
Pt returning call for lab results

## 2023-10-25 NOTE — Telephone Encounter (Signed)
Called and spoke to patient. Verified name and DOB. Below message relayed per Dr Antoine Poche. Patient verbalized understanding and agree. New prescription sent to pharmacy for Pravastatin 80 mg and refill for nitroglycerin   Rollene Rotunda, MD 10/19/2023 12:24 PM EST   LDL was up.  Is she taking Pravachol 40 mg?  If so would she consider increasing to 80 mg and repeating a lipid profile in 6 months.  Call Ms. Adrian-Munro with the results and send results to Shirline Frees, NP

## 2023-10-27 ENCOUNTER — Other Ambulatory Visit: Payer: Self-pay | Admitting: Adult Health

## 2023-10-27 ENCOUNTER — Institutional Professional Consult (permissible substitution): Payer: Medicare Other | Admitting: Internal Medicine

## 2023-10-27 DIAGNOSIS — E118 Type 2 diabetes mellitus with unspecified complications: Secondary | ICD-10-CM

## 2023-10-27 NOTE — Progress Notes (Deleted)
 [name], female    DOB: April 15, 1956    MRN: [id number]   Brief patient profile:  68  yo*** *** referred to pulmonary clinic in [place]  10/27/2023 by *** for ***      History of Present Illness  10/27/2023  Pulmonary/ 1st office eval/ She[facility] S[facility] / [facility]  No chief complaint on file.    Dyspnea:  *** Cough: *** Sleep: *** SABA use: *** 02: *** LDSCT:***  No obvious day to day or daytime pattern/variability or assoc excess/ purulent sputum or mucus plugs or hemoptysis or cp or chest tightness, subjective wheeze or overt sinus or hb symptoms.    Also denies any obvious fluctuation of symptoms with weather or environmental changes or other aggravating or alleviating factors except as outlined above   No unusual exposure hx or h/o childhood pna/ asthma or knowledge of premature birth.  Current Allergies, Complete Past Medical History, Past Surgical History, Family History, and Social History were reviewed in [name] record.  ROS  The following are not active complaints unless bolded Hoarseness, sore throat, dysphagia, dental problems, itching, sneezing,  nasal congestion or discharge of excess mucus or purulent secretions, ear ache,   fever, chills, sweats, unintended wt loss or wt gain, classically pleuritic or exertional cp,  orthopnea pnd or arm/hand swelling  or leg swelling, presyncope, palpitations, abdominal pain, anorexia, nausea, vomiting, diarrhea  or change in bowel habits or change in bladder habits, change in stools or change in urine, dysuria, hematuria,  rash, arthralgias, visual complaints, headache, numbness, weakness or ataxia or problems with walking or coordination,  change in mood or  memory.            Outpatient Medications Prior to Visit  Medication Sig Dispense Refill   amLODipine (NORVASC) 5 MG tablet Take 1 tablet by mouth once daily 90 tablet 0   busPIRone (BUSPAR) 15 MG tablet TAKE 1 TABLET BY MOUTH THREE TIMES  DAILY 270 tablet 0   Cholecalciferol (VITAMIN D3) 2000 units TABS Take 1 tablet by mouth daily.     cilostazol (PLETAL) 100 MG tablet Take 0.5 tablets (50 mg total) by mouth 2 (two) times daily. 90 tablet 3   Continuous Blood Gluc Receiver (DEXCOM G7 RECEIVER) DEVI Use with Dexcom G7 sensory 1 each 0   Continuous Glucose Sensor (DEXCOM G7 SENSOR) MISC USE AS DIRECTED EVERY 10 DAYS 3 each 0   FLUoxetine (PROZAC) 40 MG capsule Take 1 capsule by mouth once daily 90 capsule 0   glipiZIDE (GLUCOTROL XL) 10 MG 24 hr tablet Take 1 tablet by mouth once daily with breakfast 90 tablet 0   isosorbide mononitrate (IMDUR) 60 MG 24 hr tablet TAKE 1 & 1/2 (ONE & ONE-HALF) TABLETS BY MOUTH ONCE DAILY 135 tablet 0   LANTUS SOLOSTAR 100 UNIT/ML Solostar Pen Inject 10 Units into the skin 2 (two) times daily. (Patient taking differently: Inject 8 Units into the skin 2 (two) times daily.) 6 mL 2   levETIRAcetam (KEPPRA) 250 MG tablet TAKE 1 TABLET BY MOUTH THREE TIMES DAILY 270 tablet 0   levofloxacin (LEVAQUIN) 500 MG tablet Take 1 tablet (500 mg total) by mouth daily. 7 tablet 0   nitrofurantoin, macrocrystal-monohydrate, (MACROBID) 100 MG capsule Take 1 capsule (100 mg total) by mouth at bedtime. Start after Levaquin (Patient not taking: Reported on 10/18/2023) 90 capsule 3   nitroGLYCERIN (NITROSTAT) 0.4 MG SL tablet Place 1 tablet (0.4 mg total) under the tongue every 5 (five) minutes  x 3 doses as needed for chest pain. 25 tablet 3   nystatin cream (MYCOSTATIN) Apply 1 application topically 2 (two) times daily. 30 g 2   pravastatin (PRAVACHOL) 80 MG tablet Take 1 tablet (80 mg total) by mouth every evening. 90 tablet 3   pregabalin (LYRICA) 150 MG capsule Take 1 capsule by mouth twice daily 60 capsule 2   propranolol (INDERAL) 40 MG tablet Take 1 tablet by mouth twice daily 180 tablet 0   No facility-administered medications prior to visit.    Past Medical History:  Diagnosis Date   Acute MI, inferior wall,  initial episode of care (HCC) 07/08/2012   Cath-07/08/11 -distal RCA stent DES (99%), LAD 20-30%. EF 50% inferior hypokinesis  (infarct)   Anxiety    Asthma    Coronary artery disease    Depression    Diabetes mellitus    Fibromyalgia    Leukocytosis    Monocytosis    Peripheral arterial disease (HCC) 01/23/2013   Tobacco use disorder 07/08/2012      Objective:     There were no vitals taken for this visit.         Assessment   No problem-specific Assessment & Plan notes found for this encounter.     Sandrea Hughs, MD 10/27/2023

## 2023-10-28 NOTE — Telephone Encounter (Signed)
 Patient need to schedule for more refills.

## 2023-11-08 ENCOUNTER — Telehealth: Payer: Self-pay | Admitting: *Deleted

## 2023-11-08 NOTE — Telephone Encounter (Signed)
Left voicemail message for patient to call back to review procedure instructions.

## 2023-11-08 NOTE — Telephone Encounter (Signed)
Left voicemail message (DPR) with procedure instructions

## 2023-11-08 NOTE — Telephone Encounter (Signed)
Abdominal Aortogram scheduled at [facility] for: Wednesday November 09, 2023 1 PM Arrival time [facility] Main Entrance A at: 11 AM  Nothing to eat after midnight prior to procedure, clear liquids until 5 AM day of procedure.  Medication instructions: -Hold:  Insulin-AM of procedure/1/2 usual dose HS prior to procedure if takes at [facility]  Glipizide-AM of procedure -Other usual morning medications can be taken with sips of water including aspirin 81 mg.  Plan to go home the same day, you will only stay overnight if medically necessary.  You must have responsible adult to drive you home.  Someone must be with you the first 24 hours after you arrive home.  Left message for patient to call back to review instructions.

## 2023-11-09 ENCOUNTER — Encounter (HOSPITAL_COMMUNITY)
Admission: RE | Disposition: A | Discharge status: Home / Self Care | Payer: Medicare Other | Source: Home / Self Care | Attending: Cardiovascular Disease

## 2023-11-09 ENCOUNTER — Ambulatory Visit (HOSPITAL_COMMUNITY)
Admission: RE | Admit: 2023-11-09 | Discharge: 2023-11-09 | Disposition: A | Discharge status: Home / Self Care | Payer: Medicare Other | Attending: Cardiovascular Disease | Admitting: Cardiovascular Disease

## 2023-11-09 DIAGNOSIS — Z888 Allergy status to other drugs, medicaments and biological substances status: Secondary | ICD-10-CM | POA: Diagnosis not present

## 2023-11-09 DIAGNOSIS — E785 Hyperlipidemia, unspecified: Secondary | ICD-10-CM | POA: Insufficient documentation

## 2023-11-09 DIAGNOSIS — E1151 Type 2 diabetes mellitus with diabetic peripheral angiopathy without gangrene: Secondary | ICD-10-CM | POA: Diagnosis not present

## 2023-11-09 DIAGNOSIS — I252 Old myocardial infarction: Secondary | ICD-10-CM | POA: Diagnosis not present

## 2023-11-09 DIAGNOSIS — Z794 Long term (current) use of insulin: Secondary | ICD-10-CM | POA: Insufficient documentation

## 2023-11-09 DIAGNOSIS — I70213 Atherosclerosis of native arteries of extremities with intermittent claudication, bilateral legs: Secondary | ICD-10-CM | POA: Diagnosis not present

## 2023-11-09 DIAGNOSIS — I2510 Atherosclerotic heart disease of native coronary artery without angina pectoris: Secondary | ICD-10-CM | POA: Diagnosis not present

## 2023-11-09 DIAGNOSIS — I10 Essential (primary) hypertension: Secondary | ICD-10-CM | POA: Diagnosis not present

## 2023-11-09 DIAGNOSIS — F17210 Nicotine dependence, cigarettes, uncomplicated: Secondary | ICD-10-CM | POA: Diagnosis not present

## 2023-11-09 DIAGNOSIS — I350 Nonrheumatic aortic (valve) stenosis: Secondary | ICD-10-CM | POA: Diagnosis not present

## 2023-11-09 DIAGNOSIS — Z79899 Other long term (current) drug therapy: Secondary | ICD-10-CM | POA: Diagnosis not present

## 2023-11-09 DIAGNOSIS — Z7984 Long term (current) use of oral hypoglycemic drugs: Secondary | ICD-10-CM | POA: Diagnosis not present

## 2023-11-09 DIAGNOSIS — Z955 Presence of coronary angioplasty implant and graft: Secondary | ICD-10-CM | POA: Diagnosis not present

## 2023-11-09 DIAGNOSIS — I70211 Atherosclerosis of native arteries of extremities with intermittent claudication, right leg: Secondary | ICD-10-CM | POA: Diagnosis not present

## 2023-11-09 DIAGNOSIS — Z7902 Long term (current) use of antithrombotics/antiplatelets: Secondary | ICD-10-CM | POA: Insufficient documentation

## 2023-11-09 DIAGNOSIS — I708 Atherosclerosis of other arteries: Secondary | ICD-10-CM | POA: Diagnosis not present

## 2023-11-09 DIAGNOSIS — I739 Peripheral vascular disease, unspecified: Secondary | ICD-10-CM

## 2023-11-09 HISTORY — PX: ABDOMINAL AORTOGRAM W/LOWER EXTREMITY: CATH118223

## 2023-11-09 HISTORY — PX: PERIPHERAL VASCULAR INTERVENTION: CATH118257

## 2023-11-09 HISTORY — PX: PERIPHERAL VASCULAR BALLOON ANGIOPLASTY: CATH118281

## 2023-11-09 LAB — GLUCOSE, CAPILLARY
Glucose-Capillary: 158 mg/dL — ABNORMAL HIGH (ref 70–99)
Glucose-Capillary: 143 mg/dL — ABNORMAL HIGH (ref 70–99)

## 2023-11-09 LAB — POCT ACTIVATED CLOTTING TIME: Activated Clotting Time: 354 s

## 2023-11-09 SURGERY — ABDOMINAL AORTOGRAM W/LOWER EXTREMITY
Anesthesia: LOCAL | Laterality: Right

## 2023-11-09 MED ORDER — SODIUM CHLORIDE 0.9 % IV SOLN: 250.0000 mL | INTRAVENOUS | Status: DC | PRN

## 2023-11-09 MED ORDER — HEPARIN SODIUM (PORCINE) 1000 UNIT/ML IJ SOLN
INTRAMUSCULAR | Status: DC | PRN
  Administered 2023-11-09: 6000 [IU] via INTRAVENOUS

## 2023-11-09 MED ORDER — MIDAZOLAM HCL 2 MG/2ML IJ SOLN
INTRAMUSCULAR | Status: DC | PRN
  Administered 2023-11-09: 2 mg via INTRAVENOUS

## 2023-11-09 MED ORDER — SODIUM CHLORIDE 0.9 % IV SOLN: INTRAVENOUS | Status: DC

## 2023-11-09 MED ORDER — CLOPIDOGREL BISULFATE 75 MG PO TABS: 75.0000 mg | ORAL_TABLET | Freq: Every day | ORAL | 6 refills | Status: DC

## 2023-11-09 MED ORDER — CLOPIDOGREL BISULFATE 300 MG PO TABS
ORAL_TABLET | ORAL | Status: AC
  Filled 2023-11-09: qty 1

## 2023-11-09 MED ORDER — HEPARIN (PORCINE) IN NACL 1000-0.9 UT/500ML-% IV SOLN
INTRAVENOUS | Status: DC | PRN
  Administered 2023-11-09 (×2): 500 mL

## 2023-11-09 MED ORDER — MIDAZOLAM HCL 2 MG/2ML IJ SOLN
INTRAMUSCULAR | Status: AC
  Filled 2023-11-09: qty 2

## 2023-11-09 MED ORDER — LABETALOL HCL 5 MG/ML IV SOLN
INTRAVENOUS | Status: AC
Start: 2023-11-09 — End: ?
  Filled 2023-11-09: qty 4

## 2023-11-09 MED ORDER — ACETAMINOPHEN 325 MG PO TABS: 650.0000 mg | ORAL_TABLET | ORAL | Status: DC | PRN

## 2023-11-09 MED ORDER — SODIUM CHLORIDE 0.9% FLUSH: 3.0000 mL | Freq: Two times a day (BID) | INTRAVENOUS | Status: DC

## 2023-11-09 MED ORDER — LABETALOL HCL 5 MG/ML IV SOLN
10.0000 mg | INTRAVENOUS | Status: DC | PRN
  Administered 2023-11-09 (×2): 10 mg via INTRAVENOUS
  Filled 2023-11-09: qty 4

## 2023-11-09 MED ORDER — HEPARIN SODIUM (PORCINE) 1000 UNIT/ML IJ SOLN
INTRAMUSCULAR | Status: AC
  Filled 2023-11-09: qty 10

## 2023-11-09 MED ORDER — SODIUM CHLORIDE 0.9% FLUSH: 3.0000 mL | INTRAVENOUS | Status: DC | PRN

## 2023-11-09 MED ORDER — IODIXANOL 320 MG/ML IV SOLN
INTRAVENOUS | Status: DC | PRN
  Administered 2023-11-09: 65 mL

## 2023-11-09 MED ORDER — LABETALOL HCL 5 MG/ML IV SOLN
INTRAVENOUS | Status: DC | PRN
  Administered 2023-11-09: 10 mg via INTRAVENOUS

## 2023-11-09 MED ORDER — ONDANSETRON HCL 4 MG/2ML IJ SOLN
4.0000 mg | Freq: Four times a day (QID) | INTRAMUSCULAR | Status: DC | PRN
Start: 2023-11-09 — End: 2023-11-10

## 2023-11-09 MED ORDER — LIDOCAINE HCL (PF) 1 % IJ SOLN
INTRAMUSCULAR | Status: AC
  Filled 2023-11-09: qty 30

## 2023-11-09 MED ORDER — LIDOCAINE HCL (PF) 1 % IJ SOLN
INTRAMUSCULAR | Status: DC | PRN
  Administered 2023-11-09: 15 mL

## 2023-11-09 MED ORDER — CLOPIDOGREL BISULFATE 300 MG PO TABS
ORAL_TABLET | ORAL | Status: DC | PRN
  Administered 2023-11-09: 300 mg via ORAL

## 2023-11-09 MED ORDER — ASPIRIN 81 MG PO CHEW: 81.0000 mg | CHEWABLE_TABLET | ORAL | Status: DC

## 2023-11-09 MED ORDER — ASPIRIN 81 MG PO TBEC: 81.0000 mg | DELAYED_RELEASE_TABLET | Freq: Every day | ORAL | Status: AC

## 2023-11-09 SURGICAL SUPPLY — 21 items
BALLN MUSTANG 4X100X135 (BALLOONS) ×2 IMPLANT
BALLN MUSTANG 6X120X135 (BALLOONS) ×2 IMPLANT
BALLOON MUSTANG 4X100X135 (BALLOONS) IMPLANT
BALLOON MUSTANG 6X120X135 (BALLOONS) IMPLANT
CATH ANGIO 5F BER2 65CM (CATHETERS) IMPLANT
CATH ANGIO 5F PIGTAIL 65CM (CATHETERS) IMPLANT
CATH CROSS OVER TEMPO 5F (CATHETERS) IMPLANT
CATH SOFTOUCH MOTARJEME 5F (CATHETERS) IMPLANT
DEVICE CLOSURE MYNXGRIP 6/7F (Vascular Products) IMPLANT
GLIDEWIRE ADV .035X260CM (WIRE) IMPLANT
KIT ENCORE 26 ADVANTAGE (KITS) IMPLANT
KIT SINGLE USE MANIFOLD (KITS) IMPLANT
KIT SYRINGE INJ CVI SPIKEX1 (MISCELLANEOUS) IMPLANT
SET ATX-X65L (MISCELLANEOUS) IMPLANT
SHEATH CATAPULT 6F 45 MP (SHEATH) IMPLANT
SHEATH PINNACLE 5F 10CM (SHEATH) IMPLANT
SHEATH PINNACLE 6F 10CM (SHEATH) IMPLANT
STENT ELUVIA 7X120X130 (Permanent Stent) IMPLANT
TRAY PV CATH (CUSTOM PROCEDURE TRAY) ×2 IMPLANT
WIRE BENTSON .035X145CM (WIRE) IMPLANT
WIRE MICRO SET SILHO 5FR 7 (SHEATH) IMPLANT

## 2023-11-09 NOTE — Interval H&P Note (Signed)
History and Physical Interval Note:  11/09/2023 1:55 PM  [name]  has presented today for surgery, with the diagnosis of pad.  The various methods of treatment have been discussed with the patient and family. After consideration of risks, benefits and other options for treatment, the patient has consented to  Procedure(s): ABDOMINAL AORTOGRAM W/LOWER EXTREMITY (N/A) as a surgical intervention.  The patient's history has been reviewed, patient examined, no change in status, stable for surgery.  I have reviewed the patient's chart and labs.  Questions were answered to the patient's satisfaction.     Lorine Bears

## 2023-11-09 NOTE — Progress Notes (Signed)
I checked the left groin after manual pressure was held.  The groin was mostly soft with no evidence of residual hematoma.  The blood pressure was still elevated and thus I elected to give more doses of IV labetalol for better blood pressure control. I rechecked again and there was no evidence of hematoma. If the patient remains stable, she can be discharged home today.

## 2023-11-09 NOTE — Progress Notes (Signed)
Hematoma noted on left thigh, pressure held for 15 min, Dr. Kirke Corin paged. ~15 min later Dr. Kirke Corin to bed side. Told to continue holding pressure for 10 more minutes, and extend bedrest until 1900

## 2023-11-10 ENCOUNTER — Encounter (HOSPITAL_COMMUNITY): Payer: Self-pay | Admitting: Cardiovascular Disease

## 2023-11-10 ENCOUNTER — Other Ambulatory Visit: Payer: Self-pay | Admitting: *Deleted

## 2023-11-10 DIAGNOSIS — I739 Peripheral vascular disease, unspecified: Secondary | ICD-10-CM

## 2023-11-11 ENCOUNTER — Other Ambulatory Visit: Payer: Self-pay | Admitting: Adult Health

## 2023-11-11 ENCOUNTER — Encounter: Payer: Self-pay | Admitting: Adult Health

## 2023-11-11 DIAGNOSIS — M5412 Radiculopathy, cervical region: Secondary | ICD-10-CM

## 2023-11-15 NOTE — Telephone Encounter (Signed)
 Okay for refill?

## 2023-11-29 ENCOUNTER — Encounter: Payer: Self-pay | Admitting: Cardiovascular Disease

## 2023-11-29 ENCOUNTER — Ambulatory Visit: Payer: Medicare Other | Attending: Cardiovascular Disease | Admitting: Cardiovascular Disease

## 2023-11-29 VITALS — BP 128/60 | HR 67 | Ht 63.5 in | Wt 151.4 lb

## 2023-11-29 DIAGNOSIS — I2510 Atherosclerotic heart disease of native coronary artery without angina pectoris: Secondary | ICD-10-CM

## 2023-11-29 DIAGNOSIS — Z720 Tobacco use: Secondary | ICD-10-CM

## 2023-11-29 DIAGNOSIS — I739 Peripheral vascular disease, unspecified: Secondary | ICD-10-CM | POA: Diagnosis not present

## 2023-11-29 DIAGNOSIS — E785 Hyperlipidemia, unspecified: Secondary | ICD-10-CM

## 2023-11-29 DIAGNOSIS — I10 Essential (primary) hypertension: Secondary | ICD-10-CM | POA: Diagnosis not present

## 2023-11-29 NOTE — Progress Notes (Signed)
 Cardiology Office Note   Date:  11/29/2023   ID:  [name], DOB 1955/10/10, MRN [id number]  PCP:  Shirline Frees, NP  Cardiologist:   Lorine Bears, MD   No chief complaint on file.     History of Present Illness: [name] is a 68 y.o. female who is here today for a follow-up visit regarding peripheral arterial disease.   She has known history of coronary artery disease status post stent to the PDA in 2013, type 2 diabetes, tobacco use and hyperlipidemia. She was seen recently for worsening bilateral calf claudication especially on the right side.   She underwent noninvasive vascular studies in December which showed an ABI of 0.64 on the right and 0.86 on the left. I proceeded with angiography last month which showed mild to moderate stenosis in the abdominal aorta below the renal arteries with small aneurysmal segment above the iliac bifurcation.  Right lower extremity showed moderate common iliac artery stenosis with occlusion of the whole length of the external iliac artery and mild SFA disease.  On the left side, there was moderate diffuse common iliac and external iliac artery disease with moderate diffuse disease in the proximal and mid segment of the SFA.  I performed successful angioplasty and drug-eluting stent placement to the right external iliac artery.  She had a postprocedure hematoma from the left groin in the setting of severely elevated blood pressure.  This improved, did not require any intervention and she was discharged same day. She reports improvement in right leg claudication but not drastic.  She seems to be limited by arthritis pain as well.  Unfortunately, she continues to smoke.  Past Medical History:  Diagnosis Date   Acute MI, inferior wall, initial episode of care (HCC) 07/08/2012   Cath-07/08/11 -distal RCA stent DES (99%), LAD 20-30%. EF 50% inferior hypokinesis  (infarct)   Anxiety    Asthma    Coronary artery disease    Depression     Diabetes mellitus    Fibromyalgia    Leukocytosis    Monocytosis    Peripheral arterial disease (HCC) 01/23/2013   Tobacco use disorder 07/08/2012    Past Surgical History:  Procedure Laterality Date   ABDOMINAL AORTAGRAM N/A 02/07/2013   Procedure: ABDOMINAL Ronny Flurry;  Surgeon: Iran Ouch, MD;  Location: [facility] CATH LAB;  Service: Cardiovascular;  Laterality: N/A;   ABDOMINAL AORTOGRAM W/LOWER EXTREMITY N/A 11/09/2023   Procedure: ABDOMINAL AORTOGRAM W/LOWER EXTREMITY;  Surgeon: Iran Ouch, MD;  Location: [facility] INVASIVE CV LAB;  Service: Cardiovascular;  Laterality: N/A;   CARDIAC CATHETERIZATION     CORONARY STENT PLACEMENT     CYST REMOVAL TRUNK  1977   tailbone   LEFT HEART CATHETERIZATION WITH CORONARY ANGIOGRAM N/A 07/07/2012   Procedure: LEFT HEART CATHETERIZATION WITH CORONARY ANGIOGRAM;  Surgeon: Kathleene Hazel, MD;  Location: [facility] CATH LAB;  Service: Cardiovascular;  Laterality: N/A;   PERCUTANEOUS CORONARY STENT INTERVENTION (PCI-S)  07/07/2012   Procedure: PERCUTANEOUS CORONARY STENT INTERVENTION (PCI-S);  Surgeon: Kathleene Hazel, MD;  Location: [facility] CATH LAB;  Service: Cardiovascular;;   PERIPHERAL VASCULAR BALLOON ANGIOPLASTY Right 11/09/2023   Procedure: PERIPHERAL VASCULAR BALLOON ANGIOPLASTY;  Surgeon: Iran Ouch, MD;  Location: [facility] INVASIVE CV LAB;  Service: Cardiovascular;  Laterality: Right;  Ext Iliac   PERIPHERAL VASCULAR INTERVENTION Right 11/09/2023   Procedure: PERIPHERAL VASCULAR INTERVENTION;  Surgeon: Iran Ouch, MD;  Location: [facility] INVASIVE CV LAB;  Service: Cardiovascular;  Laterality: Right;  Ext Iliac  TONSILLECTOMY       Current Outpatient Medications  Medication Sig Dispense Refill   amLODipine (NORVASC) 5 MG tablet Take 1 tablet by mouth once daily 90 tablet 0   aspirin EC 81 MG tablet Take 1 tablet (81 mg total) by mouth daily. Swallow whole.     busPIRone (BUSPAR) 15 MG tablet TAKE 1 TABLET BY MOUTH THREE TIMES DAILY 270  tablet 0   Cholecalciferol (VITAMIN D3) 2000 units TABS Take 1 tablet by mouth daily.     clopidogrel (PLAVIX) 75 MG tablet Take 1 tablet (75 mg total) by mouth daily. 30 tablet 6   Continuous Blood Gluc Receiver (DEXCOM G7 RECEIVER) DEVI Use with Dexcom G7 sensory 1 each 0   Continuous Glucose Sensor (DEXCOM G7 SENSOR) MISC USE AS DIRECTED EVERY 10 DAYS 3 each 0   FLUoxetine (PROZAC) 40 MG capsule Take 1 capsule by mouth once daily 90 capsule 0   glipiZIDE (GLUCOTROL XL) 10 MG 24 hr tablet Take 1 tablet by mouth once daily with breakfast 90 tablet 0   isosorbide mononitrate (IMDUR) 60 MG 24 hr tablet TAKE 1 & 1/2 (ONE & ONE-HALF) TABLETS BY MOUTH ONCE DAILY 135 tablet 0   levETIRAcetam (KEPPRA) 250 MG tablet TAKE 1 TABLET BY MOUTH THREE TIMES DAILY 270 tablet 0   nitroGLYCERIN (NITROSTAT) 0.4 MG SL tablet Place 1 tablet (0.4 mg total) under the tongue every 5 (five) minutes x 3 doses as needed for chest pain. 25 tablet 3   nystatin cream (MYCOSTATIN) Apply 1 application topically 2 (two) times daily. 30 g 2   pravastatin (PRAVACHOL) 80 MG tablet Take 1 tablet (80 mg total) by mouth every evening. 90 tablet 3   pregabalin (LYRICA) 150 MG capsule Take 1 capsule by mouth twice daily 60 capsule 0   propranolol (INDERAL) 40 MG tablet Take 1 tablet by mouth twice daily 180 tablet 0   LANTUS SOLOSTAR 100 UNIT/ML Solostar Pen Inject 10 Units into the skin 2 (two) times daily. (Patient taking differently: Inject 8 Units into the skin 2 (two) times daily.) 6 mL 2   No current facility-administered medications for this visit.    Allergies:   Erythromycin, Statins, Ace inhibitors, Cymbalta [duloxetine hcl], Doxycycline, Jardiance [empagliflozin], and Fentanyl    Social History:  The patient  reports that she has been smoking cigarettes. She has a 43 pack-year smoking history. She has never used smokeless tobacco. She reports that she does not drink alcohol and does not use drugs.   Family History:  The  patient's family history includes Alzheimer's disease in her mother; Cancer in her father and sister; Diabetes in her father and mother; Diabetes (age of onset: 40) in her son; Diabetes (age of onset: 2) in her son; Heart disease in her father, maternal grandfather, and paternal grandmother; Kidney disease in her mother; Multiple sclerosis in her daughter.    ROS:  Please see the history of present illness.   Otherwise, review of systems are positive for none.   All other systems are reviewed and negative.    PHYSICAL EXAM: VS:  BP 128/60   Pulse 67   Ht 5' 3.5" (1.613 m)   Wt 151 lb 6.4 oz (68.7 kg)   SpO2 97%   BMI 26.40 kg/m  , BMI Body mass index is 26.4 kg/m. GEN: Well nourished, well developed, in no acute distress  HEENT: normal  Neck: no JVD, carotid bruits, or masses Cardiac: RRR; no murmurs, rubs, or gallops,no edema  Respiratory:  clear to auscultation bilaterally, normal work of breathing GI: soft, nontender, nondistended, + BS MS: no deformity or atrophy  Skin: warm and dry, no rash Neuro:  Strength and sensation are intact Psych: euthymic mood, full affect Vascular: Femoral pulses +1 bilaterally.  Distal pulses are not palpable.  No groin hematoma but she does have left thigh bruising that seems to be improving.   EKG:  EKG is not ordered today.    Recent Labs: 02/23/2023: ALT 7 10/18/2023: BUN 28; Creatinine, Ser 1.38; Hemoglobin 14.7; Platelets 309; Potassium 4.7; Sodium 142    Lipid Panel    Component Value Date/Time   CHOL 226 (H) 10/18/2023 1211   TRIG 233 (H) 10/18/2023 1211   HDL 50 10/18/2023 1211   CHOLHDL 4.5 (H) 10/18/2023 1211   CHOLHDL 3 02/24/2022 1508   VLDL 53.8 (H) 02/24/2022 1508   LDLCALC 134 (H) 10/18/2023 1211   LDLDIRECT 72.0 02/24/2022 1508      Wt Readings from Last 3 Encounters:  11/29/23 151 lb 6.4 oz (68.7 kg)  11/09/23 148 lb (67.1 kg)  10/18/23 149 lb 9.6 oz (67.9 kg)           No data to display             ASSESSMENT AND PLAN:  1.  Peripheral arterial disease with severe claudication: Status post recent drug-eluting stent placement to the right external iliac artery for a total occlusion.  Continue dual antiplatelet therapy for now.  Check ABI and aortoiliac duplex.  She does have residual moderate diffuse disease throughout that will be treated medically.   2.  Coronary artery disease involving native coronary arteries without angina: No chest pain.  Continue medical therapy.  3.  Tobacco use: I again discussed the importance of smoking cessation.  4.  Essential hypertension: Blood pressure is controlled on current medications.  5.  Hyperlipidemia: Currently on pravastatin.  She is intolerant to statins due to myalgia and seems to be having more symptoms after the dose was increased to 80 mg daily.  We should consider a PCSK9 inhibitor.    Disposition: Follow-up in 6 months.  Signed,  Lorine Bears, MD  11/29/2023 11:21 AM    [facility]

## 2023-11-29 NOTE — Patient Instructions (Signed)
 Medication Instructions:  No changes *If you need a refill on your cardiac medications before your next appointment, please call your pharmacy*   Lab Work: None ordered If you have labs (blood work) drawn today and your tests are completely normal, you will receive your results only by: MyChart Message (if you have MyChart) OR A paper copy in the mail If you have any lab test that is abnormal or we need to change your treatment, we will call you to review the results.   Testing/Procedures: None ordered   Follow-Up: At [facility], you and your health needs are our priority.  As part of our continuing mission to provide you with exceptional heart care, we have created designated Provider Care Teams.  These Care Teams include your primary Cardiologist (physician) and Advanced Practice Providers (APPs -  Physician Assistants and Nurse Practitioners) who all work together to provide you with the care you need, when you need it.  We recommend signing up for the patient portal called "MyChart".  Sign up information is provided on this After Visit Summary.  MyChart is used to connect with patients for Virtual Visits (Telemedicine).  Patients are able to view lab/test results, encounter notes, upcoming appointments, etc.  Non-urgent messages can be sent to your provider as well.   To learn more about what you can do with MyChart, go to [email or web address].    Your next appointment:   6 month(s)  Provider:   Dr. Kirke Corin

## 2023-11-30 NOTE — Progress Notes (Unsigned)
 [name], female    DOB: 1956-04-19    MRN: [id number]   Brief patient profile:  68  yowf  active smoker with passive smoke with tendency to bad sinus infections  referred to pulmonary clinic in [place]  12/01/2023 by Dr Caprice Red  for doe onset  2024 worse x Aug 2024 ? While on macrodantin?    History of Present Illness  12/01/2023  Pulmonary/ 1st office eval/ Joseantonio [facility] / [place] [facility]  Chief Complaint  Patient presents with   Consult    Sob at rest and activity    Dyspnea:  mb and back  lived there x 20 years is much more difficult and stops half way back.  50 ft flat  while on macrodantin per [name]  Cough  worse x 6 months > yellowish in ams Sleep:  sleeps on couch props up against arm of cough and pillow  SABA use: none  02: none     No obvious day to day or daytime pattern/variability or assoc excess/ purulent sputum or mucus plugs or hemoptysis or cp or chest tightness, subjective wheeze or overt  hb symptoms.    Also denies any obvious fluctuation of symptoms with weather or environmental changes or other aggravating or alleviating factors except as outlined above   No unusual exposure hx or h/o childhood pna/ asthma or knowledge of premature birth.  Current Allergies, Complete Past Medical History, Past Surgical History, Family History, and Social History were reviewed in [name] record.  ROS  The following are not active complaints unless bolded Hoarseness, sore throat, dysphagia, dental problems, itching, sneezing,  nasal congestion or discharge of excess mucus or purulent secretions, ear ache,   fever, chills, sweats, unintended wt loss or wt gain, classically pleuritic or exertional cp,  orthopnea pnd or arm/hand swelling  or leg swelling, presyncope, palpitations, abdominal pain, anorexia, nausea, vomiting, diarrhea  or change in bowel habits or change in bladder habits, change in stools or change in urine, dysuria, hematuria,  rash,  arthralgias, visual complaints, headache, numbness, weakness or ataxia or problems with walking or coordination,  change in mood or  memory.            Outpatient Medications Prior to Visit  Medication Sig Dispense Refill   amLODipine (NORVASC) 5 MG tablet Take 1 tablet by mouth once daily 90 tablet 0   aspirin EC 81 MG tablet Take 1 tablet (81 mg total) by mouth daily. Swallow whole.     busPIRone (BUSPAR) 15 MG tablet TAKE 1 TABLET BY MOUTH THREE TIMES DAILY 270 tablet 0   Cholecalciferol (VITAMIN D3) 2000 units TABS Take 1 tablet by mouth daily.     clopidogrel (PLAVIX) 75 MG tablet Take 1 tablet (75 mg total) by mouth daily. 30 tablet 6   Continuous Blood Gluc Receiver (DEXCOM G7 RECEIVER) DEVI Use with Dexcom G7 sensory 1 each 0   Continuous Glucose Sensor (DEXCOM G7 SENSOR) MISC USE AS DIRECTED EVERY 10 DAYS 3 each 0   FLUoxetine (PROZAC) 40 MG capsule Take 1 capsule by mouth once daily 90 capsule 0   glipiZIDE (GLUCOTROL XL) 10 MG 24 hr tablet Take 1 tablet by mouth once daily with breakfast 90 tablet 0   isosorbide mononitrate (IMDUR) 60 MG 24 hr tablet TAKE 1 & 1/2 (ONE & ONE-HALF) TABLETS BY MOUTH ONCE DAILY 135 tablet 0   levETIRAcetam (KEPPRA) 250 MG tablet TAKE 1 TABLET BY MOUTH THREE TIMES DAILY 270 tablet 0  nitroGLYCERIN (NITROSTAT) 0.4 MG SL tablet Place 1 tablet (0.4 mg total) under the tongue every 5 (five) minutes x 3 doses as needed for chest pain. 25 tablet 3   nystatin cream (MYCOSTATIN) Apply 1 application topically 2 (two) times daily. 30 g 2   pravastatin (PRAVACHOL) 80 MG tablet Take 1 tablet (80 mg total) by mouth every evening. 90 tablet 3   pregabalin (LYRICA) 150 MG capsule Take 1 capsule by mouth twice daily 60 capsule 0   propranolol (INDERAL) 40 MG tablet Take 1 tablet by mouth twice daily 180 tablet 0   LANTUS SOLOSTAR 100 UNIT/ML Solostar Pen Inject 10 Units into the skin 2 (two) times daily. (Patient taking differently: Inject 8 Units into the skin 2 (two)  times daily.) 6 mL 2   No facility-administered medications prior to visit.    Past Medical History:  Diagnosis Date   Acute MI, inferior wall, initial episode of care (HCC) 07/08/2012   Cath-07/08/11 -distal RCA stent DES (99%), LAD 20-30%. EF 50% inferior hypokinesis  (infarct)   Anxiety    Asthma    Coronary artery disease    Depression    Diabetes mellitus    Fibromyalgia    Leukocytosis    Monocytosis    Peripheral arterial disease (HCC) 01/23/2013   Tobacco use disorder 07/08/2012      Objective:     BP 137/66   Pulse 69   Ht 5' 3.5" (1.613 m)   Wt 153 lb 6.4 oz (69.6 kg)   SpO2 94% Comment: room air  BMI 26.75 kg/m   SpO2: 94 % (room air)  Amb hoarse elderly  wf nad/    HEENT : Oropharynx  clear  Nasal turbinates nl    NECK :  without  apparent JVD/ palpable Nodes/TM    LUNGS: no acc muscle use,  Min barrel  contour chest wall with bilateral  slightly decreased bs s audible wheeze and  without cough on insp or exp maneuvers and min  Hyperresonant  to  percussion bilaterally    CV:  RRR  no s3 or murmur or increase in P2, and no edema   ABD:  soft and nontender with pos end  insp Hoover's  in the supine position.  No bruits or organomegaly appreciated   MS:  Nl gait/ ext warm without deformities Or obvious joint restrictions  calf tenderness, cyanosis or clubbing     SKIN: warm and dry without lesions    NEURO:  alert, approp, nl sensorium with  no motor or cerebellar deficits apparent.            Assessment   Asthmatic bronchitis , chronic (HCC) Active smoker - 12/01/2023  After extensive coaching inhaler device,  effectiveness =    75% (short ti) on hfa training > try breztri pending pfts  - 12/01/2023   Walked on RA  x  2  lap(s) =  approx 300  ft  @ slow pace, stopped due to tired/legs hurt / sob  with lowest 02 sats 93%     Group D (now reclassified as E) in terms of symptom/risk and laba/lama/ICS  therefore appropriate rx at this point >>>   breztri 2bid pending pfts ordered today   Would avoid macrodantin rx in this setting/ f/u cxr pending     Cigarette smoker Counseled re importance of smoking cessation but did not meet time criteria for separate billing    Low-dose CT lung cancer screening is recommended for patients  who are 85-75 years of age with a 20+ pack-year history of smoking and who are currently smoking or quit <=15 years ago. No coughing up blood  No unintentional weight loss of > 15 pounds in the last 6 months - pt is eligible for scanning yearly until age 41 > referred today          Each maintenance medication was reviewed in detail including emphasizing most importantly the difference between maintenance and prns and under what circumstances the prns are to be triggered using an action plan format where appropriate.  Total time for H and P, chart review, counseling, reviewing hfa device(s) , directly observing portions of ambulatory 02 saturation study/ and generating customized AVS unique to this office visit / same day charting = 48 min with pt new to me                      Sandrea Hughs, MD 12/01/2023

## 2023-12-01 ENCOUNTER — Ambulatory Visit: Payer: Medicare Other | Admitting: Internal Medicine

## 2023-12-01 ENCOUNTER — Encounter: Payer: Self-pay | Admitting: Internal Medicine

## 2023-12-01 ENCOUNTER — Telehealth: Payer: Self-pay | Admitting: Internal Medicine

## 2023-12-01 VITALS — BP 137/66 | HR 69 | Ht 63.5 in | Wt 153.4 lb

## 2023-12-01 DIAGNOSIS — F17210 Nicotine dependence, cigarettes, uncomplicated: Secondary | ICD-10-CM | POA: Diagnosis not present

## 2023-12-01 DIAGNOSIS — J4489 Other specified chronic obstructive pulmonary disease: Secondary | ICD-10-CM | POA: Diagnosis not present

## 2023-12-01 DIAGNOSIS — F17200 Nicotine dependence, unspecified, uncomplicated: Secondary | ICD-10-CM

## 2023-12-01 MED ORDER — BREZTRI AEROSPHERE 160-9-4.8 MCG/ACT IN AERO
INHALATION_SPRAY | RESPIRATORY_TRACT | 11 refills | Status: DC
Start: 2023-12-01 — End: 2024-02-29

## 2023-12-01 MED ORDER — BREZTRI AEROSPHERE 160-9-4.8 MCG/ACT IN AERO: 2.0000 | INHALATION_SPRAY | Freq: Two times a day (BID) | RESPIRATORY_TRACT | Status: DC

## 2023-12-01 NOTE — Patient Instructions (Addendum)
 Breztri Take 2 puffs first thing in am and then another 2 puffs about 12 hours later.    Work on inhaler technique:  relax and gently blow all the way out then take a nice smooth full deep breath back in, triggering the inhaler at same time you start breathing in.  Hold breath in for at least  5 seconds if you can. Blow out breztri  thru nose. Rinse and gargle with water when done.  If mouth or throat bother you at all,  try brushing teeth/gums/tongue with arm and hammer toothpaste/ make a slurry and gargle and spit out.    My office will be contacting you by phone for referral for PFTs and Lung cancer screening (low dose CT)  - if you don't hear back from my office within one week please call us back or notify us thru MyChart and we'll address it right away.   Stop nitrofurantoin   The key is to stop smoking completely before smoking completely stops you!    Please remember to go to the  x-ray department  @  [facility] for your tests - we will call you with the results when they are available      Please schedule a follow up visit in 3 months but call sooner if needed

## 2023-12-01 NOTE — Assessment & Plan Note (Addendum)
 Active smoker - 12/01/2023  After extensive coaching inhaler device,  effectiveness =    75% (short ti) on hfa training > try breztri pending pfts  - 12/01/2023   Walked on RA  x  2  lap(s) =  approx 300  ft  @ slow pace, stopped due to tired/legs hurt / sob  with lowest 02 sats 93%     Group D (now reclassified as E) in terms of symptom/risk and laba/lama/ICS  therefore appropriate rx at this point >>>  breztri 2bid pending pfts ordered today   Would avoid macrodantin rx in this setting/ f/u cxr pending

## 2023-12-01 NOTE — Assessment & Plan Note (Addendum)
 Counseled re importance of smoking cessation but did not meet time criteria for separate billing    Low-dose CT lung cancer screening is recommended for patients who are 72-66 years of age with a 20+ pack-year history of smoking and who are currently smoking or quit <=15 years ago. No coughing up blood  No unintentional weight loss of > 15 pounds in the last 6 months - pt is eligible for scanning yearly until age 50 > referred today          Each maintenance medication was reviewed in detail including emphasizing most importantly the difference between maintenance and prns and under what circumstances the prns are to be triggered using an action plan format where appropriate.  Total time for H and P, chart review, counseling, reviewing hfa device(s) , directly observing portions of ambulatory 02 saturation study/ and generating customized AVS unique to this office visit / same day charting = 48 min with pt new to me

## 2023-12-01 NOTE — Telephone Encounter (Signed)
 LVM for patient to call and discuss scheduling the PFT's and 3 months follow up ordered by Dr. Marisue Ivan 12/01/23 AVS

## 2023-12-05 ENCOUNTER — Other Ambulatory Visit: Payer: Self-pay | Admitting: Adult Health

## 2023-12-05 DIAGNOSIS — E118 Type 2 diabetes mellitus with unspecified complications: Secondary | ICD-10-CM

## 2023-12-05 DIAGNOSIS — R569 Unspecified convulsions: Secondary | ICD-10-CM

## 2023-12-08 ENCOUNTER — Ambulatory Visit (HOSPITAL_BASED_OUTPATIENT_CLINIC_OR_DEPARTMENT_OTHER)
Admission: RE | Admit: 2023-12-08 | Discharge: 2023-12-08 | Disposition: A | Discharge status: Home / Self Care | Payer: Medicare Other | Source: Ambulatory Visit | Attending: Cardiology | Admitting: Cardiology

## 2023-12-08 ENCOUNTER — Ambulatory Visit (HOSPITAL_COMMUNITY)
Admission: RE | Admit: 2023-12-08 | Discharge: 2023-12-08 | Disposition: A | Discharge status: Home / Self Care | Payer: Medicare Other | Source: Ambulatory Visit | Attending: Cardiology | Admitting: Cardiology

## 2023-12-08 DIAGNOSIS — I739 Peripheral vascular disease, unspecified: Secondary | ICD-10-CM | POA: Insufficient documentation

## 2023-12-08 DIAGNOSIS — Z95820 Peripheral vascular angioplasty status with implants and grafts: Secondary | ICD-10-CM | POA: Insufficient documentation

## 2023-12-09 LAB — VAS US ABI WITH/WO TBI
Left ABI: 0.7
Right ABI: 0.91

## 2023-12-13 ENCOUNTER — Telehealth: Payer: Self-pay | Admitting: Internal Medicine

## 2023-12-13 ENCOUNTER — Encounter: Payer: Self-pay | Admitting: *Deleted

## 2023-12-13 NOTE — Telephone Encounter (Signed)
 LVM for patient regarding the Tuesday 02/28/24 2:00 pm at [facility] at 1:45 pm 1st floor registration desk for check in----follow up with Dr. Sherene Sires is Tuesday 02/28/24 at 3:30 pm.  Will mail information to patient and requested return call with questions or concerns

## 2023-12-14 ENCOUNTER — Other Ambulatory Visit (HOSPITAL_COMMUNITY): Payer: Self-pay | Admitting: *Deleted

## 2023-12-14 ENCOUNTER — Encounter: Payer: Self-pay | Admitting: Adult Health

## 2023-12-14 ENCOUNTER — Ambulatory Visit (INDEPENDENT_AMBULATORY_CARE_PROVIDER_SITE_OTHER): Admitting: Adult Health

## 2023-12-14 VITALS — BP 138/80 | HR 59 | Temp 97.9°F | Ht 63.5 in | Wt 151.0 lb

## 2023-12-14 DIAGNOSIS — M5412 Radiculopathy, cervical region: Secondary | ICD-10-CM

## 2023-12-14 DIAGNOSIS — F419 Anxiety disorder, unspecified: Secondary | ICD-10-CM

## 2023-12-14 DIAGNOSIS — Z794 Long term (current) use of insulin: Secondary | ICD-10-CM

## 2023-12-14 DIAGNOSIS — Z7984 Long term (current) use of oral hypoglycemic drugs: Secondary | ICD-10-CM | POA: Diagnosis not present

## 2023-12-14 DIAGNOSIS — I739 Peripheral vascular disease, unspecified: Secondary | ICD-10-CM

## 2023-12-14 DIAGNOSIS — E119 Type 2 diabetes mellitus without complications: Secondary | ICD-10-CM

## 2023-12-14 DIAGNOSIS — F32A Depression, unspecified: Secondary | ICD-10-CM

## 2023-12-14 LAB — POCT GLYCOSYLATED HEMOGLOBIN (HGB A1C): Hemoglobin A1C: 7.3 % — AB (ref 4.0–5.6)

## 2023-12-14 MED ORDER — LANTUS SOLOSTAR 100 UNIT/ML ~~LOC~~ SOPN: PEN_INJECTOR | SUBCUTANEOUS | 2 refills | Status: DC

## 2023-12-14 MED ORDER — PREGABALIN 150 MG PO CAPS: 150.0000 mg | ORAL_CAPSULE | Freq: Two times a day (BID) | ORAL | 2 refills | Status: DC

## 2023-12-14 NOTE — Progress Notes (Signed)
 Subjective:    Patient ID: [name], female    DOB: [birth year], 68 y.o.   MRN: [id number]  HPI  68 year old female who  has a past medical history of Acute MI, inferior wall, initial episode of care [facility]) (07/08/2012), Anxiety, Asthma, Coronary artery disease, Depression, Diabetes mellitus, Fibromyalgia, Leukocytosis, Monocytosis, Peripheral arterial disease (HCC) (01/23/2013), and Tobacco use disorder (07/08/2012).  She presents to the office today for follow up regarding DM and HTN   DM Type 2- managed with Glipizide 10 mg ER BID and Lantus 10 units BID. She does manage her blood sugars at home with readings in 100 - 250 range. She has noticed spikes in the early morning. She has not ben exercising but is trying to eat a mediterranean diet.   Lab Results  Component Value Date   HGBA1C 7.3 (A) 12/14/2023   HGBA1C 7.6 (H) 02/23/2023   HGBA1C 7.0 (A) 08/03/2022   HTN - managed with Norvac 5 mg daily and inderal 40 mg BID. She denies dizziness, lightheadedness, chest pain, shortness of breath.   BP Readings from Last 3 Encounters:  12/14/23 138/80  12/01/23 137/66  11/29/23 128/60   She also needs a refill on her Lyrica that she uses for Cervical radiculopathy.   She has a history of anxiety and depression. She is currently taking Buspar and Prozac.Reports some days are better than others. She feels as though she is ready to see a therapist.   Review of Systems See HPI   Past Medical History:  Diagnosis Date   Acute MI, inferior wall, initial episode of care (HCC) 07/08/2012   Cath-07/08/11 -distal RCA stent DES (99%), LAD 20-30%. EF 50% inferior hypokinesis  (infarct)   Anxiety    Asthma    Coronary artery disease    Depression    Diabetes mellitus    Fibromyalgia    Leukocytosis    Monocytosis    Peripheral arterial disease (HCC) 01/23/2013   Tobacco use disorder 07/08/2012    Social History   Socioeconomic History   Marital status: Married    Spouse name:  Not on file   Number of children: 3   Years of education: college   Highest education level: Bachelor's degree (e.g., BA, AB, BS)  Occupational History   Not on file  Tobacco Use   Smoking status: Every Day    Current packs/day: 1.00    Average packs/day: 1 pack/day for 43.0 years (43.0 ttl pk-yrs)    Types: Cigarettes   Smokeless tobacco: Never  Substance and Sexual Activity   Alcohol use: No    Alcohol/week: 0.0 standard drinks of alcohol    Comment: Rarely   Drug use: No   Sexual activity: Yes  Other Topics Concern   Not on file  Social History Narrative   Lives with husband, married for 20 years.      They have three grown chlildren.   She is not currently working and is on disability   Highest level of education:  Engineer, maintenance (IT)   Pets: two dogs and three rabbits.       Husband is a long Production assistant, radio.       Is unable to enjoy any activities because of body pains.       Social Drivers of Corporate investment banker Strain: Low Risk  (04/13/2023)   Overall Financial Resource Strain (CARDIA)    Difficulty of Paying Living Expenses: Not hard at all  Food Insecurity: Patient Declined (  05/13/2023)   Hunger Vital Sign    Worried About Running Out of Food in the Last Year: Patient declined    Ran Out of Food in the Last Year: Patient declined  Transportation Needs: Unmet Transportation Needs (05/13/2023)   PRAPARE - Administrator, Civil Service (Medical): Yes    Lack of Transportation (Non-Medical): Yes  Physical Activity: Unknown (05/13/2023)   Exercise Vital Sign    Days of Exercise per Week: Patient declined    Minutes of Exercise per Session: 0 min  Recent Concern: Physical Activity - Inactive (04/13/2023)   Exercise Vital Sign    Days of Exercise per Week: 0 days    Minutes of Exercise per Session: 0 min  Stress: Stress Concern Present (05/13/2023)   [employer] of Occupational Health - Occupational Stress Questionnaire    Feeling of Stress  : Very much  Social Connections: Moderately Isolated (05/13/2023)   Social Connection and Isolation Panel [NHANES]    Frequency of Communication with Friends and Family: Once a week    Frequency of Social Gatherings with Friends and Family: Never    Attends Religious Services: 1 to 4 times per year    Active Member of [employer] or Organizations: No    Attends Banker Meetings: Never    Marital Status: Married  Catering manager Violence: Not At Risk (04/13/2023)   Humiliation, Afraid, Rape, and Kick questionnaire    Fear of Current or Ex-Partner: No    Emotionally Abused: No    Physically Abused: No    Sexually Abused: No    Past Surgical History:  Procedure Laterality Date   ABDOMINAL AORTAGRAM N/A 02/07/2013   Procedure: ABDOMINAL Ronny Flurry;  Surgeon: Iran Ouch, MD;  Location: [facility] CATH LAB;  Service: Cardiovascular;  Laterality: N/A;   ABDOMINAL AORTOGRAM W/LOWER EXTREMITY N/A 11/09/2023   Procedure: ABDOMINAL AORTOGRAM W/LOWER EXTREMITY;  Surgeon: Iran Ouch, MD;  Location: [facility] INVASIVE CV LAB;  Service: Cardiovascular;  Laterality: N/A;   CARDIAC CATHETERIZATION     CORONARY STENT PLACEMENT     CYST REMOVAL TRUNK  1977   tailbone   LEFT HEART CATHETERIZATION WITH CORONARY ANGIOGRAM N/A 07/07/2012   Procedure: LEFT HEART CATHETERIZATION WITH CORONARY ANGIOGRAM;  Surgeon: Kathleene Hazel, MD;  [facility] CATH LAB;  Service: Cardiovascular;  Laterality: N/A;   PERCUTANEOUS CORONARY STENT INTERVENTION (PCI-S)  07/07/2012   Procedure: PERCUTANEOUS CORONARY STENT INTERVENTION (PCI-S);  Surgeon: Kathleene Hazel, MD;  Location: [facility] CATH LAB;  Service: Cardiovascular;;   PERIPHERAL VASCULAR BALLOON ANGIOPLASTY Right 11/09/2023   Procedure: PERIPHERAL VASCULAR BALLOON ANGIOPLASTY;  Surgeon: Iran Ouch, MD;  Location: [facility] INVASIVE CV LAB;  Service: Cardiovascular;  Laterality: Right;  Ext Iliac   PERIPHERAL VASCULAR INTERVENTION Right 11/09/2023    Procedure: PERIPHERAL VASCULAR INTERVENTION;  Surgeon: Iran Ouch, MD;  Location: [facility] INVASIVE CV LAB;  Service: Cardiovascular;  Laterality: Right;  Ext Iliac   TONSILLECTOMY      Family History  Problem Relation Age of Onset   Kidney disease Mother    Diabetes Mother        DM Type II   Alzheimer's disease Mother    Diabetes Father        IDDM   Heart disease Father    Cancer Father        bone cancer died at 71   Cancer Sister        bone cancer at age 26   Multiple sclerosis Daughter  Diabetes Son 2       DM Type I   Heart disease Maternal Grandfather    Heart disease Paternal Grandmother    Diabetes Son 61       DM Type I    Allergies  Allergen Reactions   Erythromycin Other (See Comments)   Statins     SEVERE MUSCLE PAIN - failed lipitor, Livalo, red yeast rice (lovastatin), and she thinks Zocor and pravastatin   Ace Inhibitors Cough    Patient refuses to take meds   Cymbalta [Duloxetine Hcl]     Drowsiness   Doxycycline Other (See Comments)    diarrhea   Jardiance [Empagliflozin]     Yeast infections     Fentanyl Anxiety    Current Outpatient Medications on File Prior to Visit  Medication Sig Dispense Refill   amLODipine (NORVASC) 5 MG tablet Take 1 tablet by mouth once daily 90 tablet 0   aspirin EC 81 MG tablet Take 1 tablet (81 mg total) by mouth daily. Swallow whole.     Budeson-Glycopyrrol-Formoterol (BREZTRI AEROSPHERE) 160-9-4.8 MCG/ACT AERO Inhale 2 puffs into the lungs in the morning and at bedtime. 1 each    Budeson-Glycopyrrol-Formoterol (BREZTRI AEROSPHERE) 160-9-4.8 MCG/ACT AERO Take 2 puffs first thing in am and then another 2 puffs about 12 hours later. 10.7 g 11   busPIRone (BUSPAR) 15 MG tablet TAKE 1 TABLET BY MOUTH THREE TIMES DAILY 270 tablet 0   Cholecalciferol (VITAMIN D3) 2000 units TABS Take 1 tablet by mouth daily.     clopidogrel (PLAVIX) 75 MG tablet Take 1 tablet (75 mg total) by mouth daily. 30 tablet 6   Continuous Blood  Gluc Receiver (DEXCOM G7 RECEIVER) DEVI Use with Dexcom G7 sensory 1 each 0   Continuous Glucose Sensor (DEXCOM G7 SENSOR) MISC USE AS DIRECTED EVERY 10 DAYS 3 each 0   FLUoxetine (PROZAC) 40 MG capsule Take 1 capsule by mouth once daily 90 capsule 0   glipiZIDE (GLUCOTROL XL) 10 MG 24 hr tablet Take 1 tablet by mouth once daily with breakfast 90 tablet 0   isosorbide mononitrate (IMDUR) 60 MG 24 hr tablet TAKE 1 & 1/2 (ONE & ONE-HALF) TABLETS BY MOUTH ONCE DAILY 135 tablet 0   levETIRAcetam (KEPPRA) 250 MG tablet TAKE 1 TABLET BY MOUTH THREE TIMES DAILY 270 tablet 0   nitroGLYCERIN (NITROSTAT) 0.4 MG SL tablet Place 1 tablet (0.4 mg total) under the tongue every 5 (five) minutes x 3 doses as needed for chest pain. 25 tablet 3   nystatin cream (MYCOSTATIN) Apply 1 application topically 2 (two) times daily. 30 g 2   pravastatin (PRAVACHOL) 80 MG tablet Take 1 tablet (80 mg total) by mouth every evening. 90 tablet 3   pregabalin (LYRICA) 150 MG capsule Take 1 capsule by mouth twice daily 60 capsule 0   propranolol (INDERAL) 40 MG tablet Take 1 tablet by mouth twice daily 180 tablet 0   LANTUS SOLOSTAR 100 UNIT/ML Solostar Pen Inject 10 Units into the skin 2 (two) times daily. (Patient taking differently: Inject 8 Units into the skin 2 (two) times daily.) 6 mL 2   No current facility-administered medications on file prior to visit.    BP 138/80   Pulse (!) 59   Temp 97.9 F (36.6 C) (Oral)   Ht 5' 3.5" (1.613 m)   Wt 151 lb (68.5 kg)   SpO2 97%   BMI 26.33 kg/m       Objective:   Physical Exam  Vitals and nursing note reviewed.  Constitutional:      Appearance: Normal appearance.  Cardiovascular:     Rate and Rhythm: Normal rate and regular rhythm.     Pulses: Normal pulses.     Heart sounds: Normal heart sounds.  Pulmonary:     Effort: Pulmonary effort is normal.     Breath sounds: Normal breath sounds.  Skin:    General: Skin is warm and dry.     Capillary Refill: Capillary  refill takes less than 2 seconds.  Neurological:     Mental Status: She is alert.  Psychiatric:        Mood and Affect: Mood normal.        Behavior: Behavior normal.        Thought Content: Thought content normal.        Judgment: Judgment normal.       Assessment & Plan:   1. Diabetes mellitus treated with oral medication (HCC) (Primary)  - POC HgB A1c- 7.2  - Continue with Glipizide   2. Current use of insulin (HCC)  - POC HgB A1c - Will increase lantus to 12 units in the evening and keep her morning dose at 10 units.  - Follow up in 3 months  - LANTUS SOLOSTAR 100 UNIT/ML Solostar Pen; 10 units in the morning and 12 units in the evening  Dispense: 6 mL; Refill: 2  3. Cervical radiculopathy  - pregabalin (LYRICA) 150 MG capsule; Take 1 capsule (150 mg total) by mouth 2 (two) times daily.  Dispense: 60 capsule; Refill: 2  4. Anxiety and depression  - Ambulatory referral to Psychiatry   Shirline Frees, NP

## 2023-12-14 NOTE — Patient Instructions (Signed)
 Health Maintenance Due  Topic Date Due   COVID-19 Vaccine (1) Never done   DTaP/Tdap/Td (1 - Tdap) Never done   Zoster Vaccines- Shingrix (1 of 2) Never done   Cervical Cancer Screening (HPV/Pap Cotest)  Never done   OPHTHALMOLOGY EXAM  05/11/2014   Diabetic kidney evaluation - Urine ACR  02/25/2023   FOOT EXAM  02/25/2023   HEMOGLOBIN A1C  08/26/2023       04/13/2023    3:12 PM 02/23/2022    3:05 PM 11/06/2021    3:25 PM  Depression screen PHQ 2/9  Decreased Interest 0 0 3  Down, Depressed, Hopeless 0 1 3  PHQ - 2 Score 0 1 6  Altered sleeping   3  Tired, decreased energy   3  Change in appetite   3  Feeling bad or failure about yourself    3  Trouble concentrating   3  Moving slowly or fidgety/restless   2  Suicidal thoughts   0  PHQ-9 Score   23  Difficult doing work/chores   Somewhat difficult

## 2023-12-21 ENCOUNTER — Other Ambulatory Visit: Payer: Self-pay | Admitting: Adult Health

## 2023-12-21 DIAGNOSIS — M5412 Radiculopathy, cervical region: Secondary | ICD-10-CM

## 2023-12-28 ENCOUNTER — Telehealth: Payer: Self-pay | Admitting: Internal Medicine

## 2023-12-28 NOTE — Telephone Encounter (Signed)
 Patient did not complete the DG Chest 2 view requested on 12/01/23   Pease advise

## 2024-01-05 ENCOUNTER — Telehealth: Payer: Self-pay | Admitting: Internal Medicine

## 2024-01-05 NOTE — Telephone Encounter (Signed)
 Called to discuss rescheduling the 02/28/24 3:30 pm appointment with Dr. Wynell Balloon request I cal back next week---she was in the middle of a funeral

## 2024-01-14 ENCOUNTER — Other Ambulatory Visit: Payer: Self-pay | Admitting: Adult Health

## 2024-01-14 DIAGNOSIS — I2119 ST elevation (STEMI) myocardial infarction involving other coronary artery of inferior wall: Secondary | ICD-10-CM

## 2024-01-16 NOTE — Telephone Encounter (Signed)
 Spoke with patent regarding new appointment date and time for PFT follow up----Wednesday 03/10/24 at 2:45 pm----will mail information to patient and she voiced her understanding

## 2024-01-19 ENCOUNTER — Encounter: Payer: Self-pay | Admitting: Adult Health

## 2024-01-26 ENCOUNTER — Ambulatory Visit: Admitting: Adult Health

## 2024-01-26 ENCOUNTER — Encounter: Payer: Self-pay | Admitting: Adult Health

## 2024-01-26 VITALS — BP 130/70 | HR 93 | Temp 98.1°F | Ht 63.5 in | Wt 150.0 lb

## 2024-01-26 DIAGNOSIS — E119 Type 2 diabetes mellitus without complications: Secondary | ICD-10-CM | POA: Diagnosis not present

## 2024-01-26 DIAGNOSIS — Z7984 Long term (current) use of oral hypoglycemic drugs: Secondary | ICD-10-CM

## 2024-01-26 DIAGNOSIS — Z794 Long term (current) use of insulin: Secondary | ICD-10-CM

## 2024-01-26 DIAGNOSIS — R61 Generalized hyperhidrosis: Secondary | ICD-10-CM

## 2024-01-26 DIAGNOSIS — R6883 Chills (without fever): Secondary | ICD-10-CM | POA: Diagnosis not present

## 2024-01-26 DIAGNOSIS — R0981 Nasal congestion: Secondary | ICD-10-CM

## 2024-01-26 DIAGNOSIS — R232 Flushing: Secondary | ICD-10-CM

## 2024-01-26 LAB — GLUCOSE, POCT (MANUAL RESULT ENTRY): POC Glucose: 166 mg/dL — AB (ref 70–99)

## 2024-01-26 NOTE — Progress Notes (Signed)
 Subjective:    Patient ID: [name], female    DOB: 08/28/1956, 68 y.o.   MRN: [id number]  HPI  68 year old female who  has a past medical history of Acute MI, inferior wall, initial episode of care [facility]) (07/08/2012), Anxiety, Asthma, Coronary artery disease, Depression, Diabetes mellitus, Fibromyalgia, Leukocytosis, Monocytosis, Peripheral arterial disease (HCC) (01/23/2013), and Tobacco use disorder (07/08/2012).  She presents to the office today for multiple issues with her husband.   DM  Type 2- managed with Glipizide  10 mg ER BID and Lantus  10 units  in the morning and 12 units in the evening.  She reports that her blood sugars have been "all over the place".  For the most part in the evening her blood sugars go into the 300s out that morning her blood sugars up to the 40s.  She does not necessarily watch what she eats in the evening and does not eat much in the morning. Wt Readings from Last 3 Encounters:  12/14/23 151 lb (68.5 kg)  12/01/23 153 lb 6.4 oz (69.6 kg)  11/29/23 151 lb 6.4 oz (68.7 kg)   She feels as though she has a sinus infection. She has nasal congestion, feels like her face is swollen and dark circles around her eyes. She has been seen multiple times for suspected sinusitis in the past.  She did have a CT maxillofacial without contrast back in September 2024 which showed clear sinuses, this was after treatment though.  Biggest complaint today is a concern for temperature regulation.  She reports that her symptoms have been intermittent over the years but over the last 3 weeks she has had 3 separate episodes where she will develop significant diaphoresis throughout her body and flushing ( husband has only noticed flushing in the face) followed by "extremely cold chills".  Her symptoms when present last all night on and off.  Dems have been present during the day from time to time but are less significant.  Is been has not noticed any muscle rigidity during these  episodes but the patient does report that she often feels stiff when she gets the chills.  She has not had a fever.  She denies rashes or insect bites.No loss of vision.  She reports that her symptoms are debilitating.   Review of Systems See HPI   Past Medical History:  Diagnosis Date   Acute MI, inferior wall, initial episode of care (HCC) 07/08/2012   Cath-07/08/11 -distal RCA stent DES (99%), LAD 20-30%. EF 50% inferior hypokinesis  (infarct)   Anxiety    Asthma    Coronary artery disease    Depression    Diabetes mellitus    Fibromyalgia    Leukocytosis    Monocytosis    Peripheral arterial disease (HCC) 01/23/2013   Tobacco use disorder 07/08/2012    Social History   Socioeconomic History   Marital status: Married    Spouse name: Not on file   Number of children: 3   Years of education: college   Highest education level: Bachelor's degree (e.g., BA, AB, BS)  Occupational History   Not on file  Tobacco Use   Smoking status: Every Day    Current packs/day: 1.00    Average packs/day: 1 pack/day for 43.0 years (43.0 ttl pk-yrs)    Types: Cigarettes   Smokeless tobacco: Never  Substance and Sexual Activity   Alcohol use: No    Alcohol/week: 0.0 standard drinks of alcohol    Comment: Rarely  Drug use: No   Sexual activity: Yes  Other Topics Concern   Not on file  Social History Narrative   Lives with husband, married for 20 years.      They have three grown chlildren.   She is not currently working and is on disability   Highest level of education:  Engineer, maintenance (IT)   Pets: two dogs and three rabbits.       Husband is a long Production assistant, radio.       Is unable to enjoy any activities because of body pains.       Social Drivers of Health   Financial Resource Strain: Low Risk  (04/13/2023)   Overall Financial Resource Strain (CARDIA)    Difficulty of Paying Living Expenses: Not hard at all  Food Insecurity: Patient Declined (05/13/2023)   Hunger Vital Sign     Worried About Running Out of Food in the Last Year: Patient declined    Ran Out of Food in the Last Year: Patient declined  Transportation Needs: Unmet Transportation Needs (05/13/2023)   PRAPARE - Administrator, Civil Service (Medical): Yes    Lack of Transportation (Non-Medical): Yes  Physical Activity: Unknown (05/13/2023)   Exercise Vital Sign    Days of Exercise per Week: Patient declined    Minutes of Exercise per Session: 0 min  Recent Concern: Physical Activity - Inactive (04/13/2023)   Exercise Vital Sign    Days of Exercise per Week: 0 days    Minutes of Exercise per Session: 0 min  Stress: Stress Concern Present (05/13/2023)   [employer] of Occupational Health - Occupational Stress Questionnaire    Feeling of Stress : Very much  Social Connections: Moderately Isolated (05/13/2023)   Social Connection and Isolation Panel [NHANES]    Frequency of Communication with Friends and Family: Once a week    Frequency of Social Gatherings with Friends and Family: Never    Attends Religious Services: 1 to 4 times per year    Active Member of [employer] or Organizations: No    Attends Banker Meetings: Never    Marital Status: Married  Catering manager Violence: Not At Risk (04/13/2023)   Humiliation, Afraid, Rape, and Kick questionnaire    Fear of Current or Ex-Partner: No    Emotionally Abused: No    Physically Abused: No    Sexually Abused: No    Past Surgical History:  Procedure Laterality Date   ABDOMINAL AORTAGRAM N/A 02/07/2013   Procedure: ABDOMINAL Tommi Fraise;  Surgeon: Wenona Hamilton, MD;  Location: [facility] CATH LAB;  Service: Cardiovascular;  Laterality: N/A;   ABDOMINAL AORTOGRAM W/LOWER EXTREMITY N/A 11/09/2023   Procedure: ABDOMINAL AORTOGRAM W/LOWER EXTREMITY;  Surgeon: Wenona Hamilton, MD;  Location: [facility] INVASIVE CV LAB;  Service: Cardiovascular;  Laterality: N/A;   CARDIAC CATHETERIZATION     CORONARY STENT PLACEMENT     CYST REMOVAL TRUNK   1977   tailbone   LEFT HEART CATHETERIZATION WITH CORONARY ANGIOGRAM N/A 07/07/2012   Procedure: LEFT HEART CATHETERIZATION WITH CORONARY ANGIOGRAM;  Surgeon: Odie Benne, MD;  Location: [facility] CATH LAB;  Service: Cardiovascular;  Laterality: N/A;   PERCUTANEOUS CORONARY STENT INTERVENTION (PCI-S)  07/07/2012   Procedure: PERCUTANEOUS CORONARY STENT INTERVENTION (PCI-S);  Surgeon: Odie Benne, MD;  Location: [facility] CATH LAB;  Service: Cardiovascular;;   PERIPHERAL VASCULAR BALLOON ANGIOPLASTY Right 11/09/2023   Procedure: PERIPHERAL VASCULAR BALLOON ANGIOPLASTY;  Surgeon: Wenona Hamilton, MD;  Location: [facility] INVASIVE CV LAB;  Service: Cardiovascular;  Laterality: Right;  Ext Iliac   PERIPHERAL VASCULAR INTERVENTION Right 11/09/2023   Procedure: PERIPHERAL VASCULAR INTERVENTION;  Surgeon: Wenona Hamilton, MD;  Location: [facility] INVASIVE CV LAB;  Service: Cardiovascular;  Laterality: Right;  Ext Iliac   TONSILLECTOMY      Family History  Problem Relation Age of Onset   Kidney disease Mother    Diabetes Mother        DM Type II   Alzheimer's disease Mother    Diabetes Father        IDDM   Heart disease Father    Cancer Father        bone cancer died at 60   Cancer Sister        bone cancer at age 60   Multiple sclerosis Daughter    Diabetes Son 2       DM Type I   Heart disease Maternal Grandfather    Heart disease Paternal Grandmother    Diabetes Son 66       DM Type I    Allergies  Allergen Reactions   Erythromycin Other (See Comments)   Statins     SEVERE MUSCLE PAIN - failed lipitor , Livalo , red yeast rice (lovastatin), and she thinks Zocor and pravastatin    Ace Inhibitors Cough    Patient refuses to take meds   Cymbalta [Duloxetine Hcl]     Drowsiness   Doxycycline Other (See Comments)    diarrhea   Jardiance [Empagliflozin]     Yeast infections     Fentanyl  Anxiety    Current Outpatient Medications on File Prior to Visit  Medication Sig Dispense Refill    amLODipine  (NORVASC ) 5 MG tablet Take 1 tablet by mouth once daily 90 tablet 0   aspirin  EC 81 MG tablet Take 1 tablet (81 mg total) by mouth daily. Swallow whole.     Budeson-Glycopyrrol-Formoterol (BREZTRI  AEROSPHERE) 160-9-4.8 MCG/ACT AERO Inhale 2 puffs into the lungs in the morning and at bedtime. 1 each    Budeson-Glycopyrrol-Formoterol (BREZTRI  AEROSPHERE) 160-9-4.8 MCG/ACT AERO Take 2 puffs first thing in am and then another 2 puffs about 12 hours later. 10.7 g 11   busPIRone  (BUSPAR ) 15 MG tablet TAKE 1 TABLET BY MOUTH THREE TIMES DAILY 270 tablet 0   Cholecalciferol (VITAMIN D3) 2000 units TABS Take 1 tablet by mouth daily.     clopidogrel  (PLAVIX ) 75 MG tablet Take 1 tablet (75 mg total) by mouth daily. 30 tablet 6   Continuous Blood Gluc Receiver (DEXCOM G7 RECEIVER) DEVI Use with Dexcom G7 sensory 1 each 0   Continuous Glucose Sensor (DEXCOM G7 SENSOR) MISC USE AS DIRECTED EVERY 10 DAYS 3 each 0   FLUoxetine  (PROZAC ) 40 MG capsule Take 1 capsule by mouth once daily 90 capsule 0   glipiZIDE  (GLUCOTROL  XL) 10 MG 24 hr tablet Take 1 tablet by mouth once daily with breakfast 90 tablet 0   isosorbide  mononitrate (IMDUR ) 60 MG 24 hr tablet TAKE 1 & 1/2 (ONE & ONE-HALF) TABLETS BY MOUTH ONCE DAILY 135 tablet 0   LANTUS  SOLOSTAR 100 UNIT/ML Solostar Pen 10 units in the morning and 12 units in the evening 6 mL 2   levETIRAcetam  (KEPPRA ) 250 MG tablet TAKE 1 TABLET BY MOUTH THREE TIMES DAILY 270 tablet 0   nitroGLYCERIN  (NITROSTAT ) 0.4 MG SL tablet Place 1 tablet (0.4 mg total) under the tongue every 5 (five) minutes x 3 doses as needed for chest pain. 25 tablet 3   nystatin   cream (MYCOSTATIN ) Apply 1 application topically 2 (two) times daily. 30 g 2   pregabalin  (LYRICA ) 150 MG capsule Take 1 capsule (150 mg total) by mouth 2 (two) times daily. 60 capsule 2   propranolol  (INDERAL ) 40 MG tablet Take 1 tablet by mouth twice daily 180 tablet 0   pravastatin  (PRAVACHOL ) 80 MG tablet Take 1  tablet (80 mg total) by mouth every evening. 90 tablet 3   No current facility-administered medications on file prior to visit.    BP 130/70   Pulse 93   Temp 98.1 F (36.7 C) (Oral)   Ht 5' 3.5" (1.613 m)   Wt 150 lb (68 kg)   SpO2 98%   BMI 26.15 kg/m       Objective:   Physical Exam Vitals and nursing note reviewed.  Constitutional:      Appearance: Normal appearance.  HENT:     Nose: No congestion or rhinorrhea.     Right Sinus: Maxillary sinus tenderness and frontal sinus tenderness present.     Left Sinus: Maxillary sinus tenderness and frontal sinus tenderness present.  Cardiovascular:     Rate and Rhythm: Normal rate and regular rhythm.     Pulses: Normal pulses.     Heart sounds: Normal heart sounds.  Pulmonary:     Effort: Pulmonary effort is normal.     Breath sounds: Normal breath sounds.  Musculoskeletal:        General: Normal range of motion.  Skin:    General: Skin is warm and dry.  Neurological:     General: No focal deficit present.     Mental Status: She is alert and oriented to person, place, and time.  Psychiatric:        Mood and Affect: Mood normal.        Behavior: Behavior normal.        Thought Content: Thought content normal.        Judgment: Judgment normal.       Assessment & Plan:  1. Diabetes mellitus treated with oral medication (HCC) (Primary) - Encouraged her to eat breakfast in the morning and then increase insulin  to 14 units at night  - POC Glucose (CBG)- 166.  - Microalbumin/Creatinine Ratio, Urine; Future  2. Sinus congestion - Will order CT sinus prior to starting abx. Consider referral to Allergy - CT SINUS WO CONTRAST; Future  3. Current use of insulin  (HCC)  - Microalbumin/Creatinine Ratio, Urine; Future  4. Flushing -Unknown cause of her symptoms.  Need to rule out hormonal issue, hypothalamus dysfunction, and MS.  Consider referral to neurologist or endocrinologist. - Comprehensive metabolic panel with GFR;  Future - CBC; Future - Heavy Metals Panel, Blood; Future - Estrogens, Total; Future - Progesterone; Future - TSH; Future - DG Chest 2 View; Future - Growth hormone; Future - Luteinizing Hormone; Future - Follicle Stimulating Hormone; Future - Prolactin; Future - Cortisol-am, blood; Future - ACTH; Future - Osmolality; Future - Osmolality, urine; Future - MR Brain Wo Contrast; Future  5. Diaphoresis - Comprehensive metabolic panel with GFR; Future - CBC; Future - Heavy Metals Panel, Blood; Future - Estrogens, Total; Future - Progesterone; Future - TSH; Future - DG Chest 2 View; Future - Growth hormone; Future - Luteinizing Hormone; Future - Follicle Stimulating Hormone; Future - Prolactin; Future - Cortisol-am, blood; Future - ACTH; Future - Osmolality; Future - Osmolality, urine; Future - MR Brain Wo Contrast; Future  6. Chills  - Comprehensive metabolic panel with GFR; Future -  CBC; Future - Heavy Metals Panel, Blood; Future - Estrogens, Total; Future - Progesterone; Future - TSH; Future - DG Chest 2 View; Future - Growth hormone; Future - Luteinizing Hormone; Future - Follicle Stimulating Hormone; Future - Prolactin; Future - Cortisol-am, blood; Future - ACTH; Future - Osmolality; Future - Osmolality, urine; Future - MR Brain Wo Contrast; Future  Alto Atta, NP  Time spent with patient today was 50 minutes which consisted of chart review, discussing  DM, sinusitis, and diaphoresis, flushing, and chills,  work up, treatment answering questions and documentation.

## 2024-01-27 ENCOUNTER — Other Ambulatory Visit (INDEPENDENT_AMBULATORY_CARE_PROVIDER_SITE_OTHER)

## 2024-01-27 ENCOUNTER — Ambulatory Visit
Admission: RE | Admit: 2024-01-27 | Discharge: 2024-01-27 | Disposition: A | Discharge status: Home / Self Care | Source: Ambulatory Visit | Attending: Adult Health

## 2024-01-27 DIAGNOSIS — R61 Generalized hyperhidrosis: Secondary | ICD-10-CM

## 2024-01-27 DIAGNOSIS — Z7984 Long term (current) use of oral hypoglycemic drugs: Secondary | ICD-10-CM | POA: Diagnosis not present

## 2024-01-27 DIAGNOSIS — R0981 Nasal congestion: Secondary | ICD-10-CM

## 2024-01-27 DIAGNOSIS — R232 Flushing: Secondary | ICD-10-CM | POA: Diagnosis not present

## 2024-01-27 DIAGNOSIS — E119 Type 2 diabetes mellitus without complications: Secondary | ICD-10-CM | POA: Diagnosis not present

## 2024-01-27 DIAGNOSIS — R6883 Chills (without fever): Secondary | ICD-10-CM

## 2024-01-27 DIAGNOSIS — J3489 Other specified disorders of nose and nasal sinuses: Secondary | ICD-10-CM | POA: Diagnosis not present

## 2024-01-27 DIAGNOSIS — Z794 Long term (current) use of insulin: Secondary | ICD-10-CM

## 2024-01-27 LAB — COMPREHENSIVE METABOLIC PANEL WITH GFR
ALT: 12 U/L (ref 0–35)
AST: 18 U/L (ref 0–37)
Albumin: 4.3 g/dL (ref 3.5–5.2)
Alkaline Phosphatase: 60 U/L (ref 39–117)
BUN: 22 mg/dL (ref 6–23)
CO2: 22 meq/L (ref 19–32)
Calcium: 9.6 mg/dL (ref 8.4–10.5)
Chloride: 103 meq/L (ref 96–112)
Creatinine, Ser: 1.41 mg/dL — ABNORMAL HIGH (ref 0.40–1.20)
GFR: 39.03 mL/min — ABNORMAL LOW (ref >60.00)
Glucose, Bld: 70 mg/dL (ref 70–99)
Potassium: 3.7 meq/L (ref 3.5–5.1)
Sodium: 135 meq/L (ref 135–145)
Total Bilirubin: 0.2 mg/dL (ref 0.2–1.2)
Total Protein: 7.3 g/dL (ref 6.0–8.3)

## 2024-01-27 LAB — TSH: TSH: 0.85 u[IU]/mL (ref 0.35–5.50)

## 2024-01-27 LAB — LUTEINIZING HORMONE: LH: 21.01 m[IU]/mL

## 2024-01-27 LAB — MICROALBUMIN / CREATININE URINE RATIO
Creatinine,U: 29.4 mg/dL
Microalb Creat Ratio: UNDETERMINED mg/g (ref 0.0–30.0)
Microalb, Ur: <0.7 mg/dL

## 2024-01-27 LAB — FOLLICLE STIMULATING HORMONE: FSH: 39.8 m[IU]/mL

## 2024-01-28 LAB — CBC
HCT: 46 % (ref 36.0–46.0)
Hemoglobin: 15 g/dL (ref 12.0–15.0)
MCHC: 32.7 g/dL (ref 30.0–36.0)
MCV: 98.5 fl (ref 78.0–100.0)
Platelets: 260 10*3/uL (ref 150.0–400.0)
RBC: 4.67 Mil/uL (ref 3.87–5.11)
RDW: 13.7 % (ref 11.5–15.5)
WBC: 14.4 10*3/uL — ABNORMAL HIGH (ref 4.0–10.5)

## 2024-01-31 LAB — OSMOLALITY, URINE: Osmolality, Ur: 113 mosm/kg (ref 50–1200)

## 2024-02-01 ENCOUNTER — Other Ambulatory Visit

## 2024-02-01 ENCOUNTER — Encounter: Payer: Self-pay | Admitting: Adult Health

## 2024-02-01 LAB — OSMOLALITY: Osmolality: 286 mosm/kg (ref 278–305)

## 2024-02-01 LAB — CORTISOL-AM, BLOOD: Cortisol - AM: 29.8 ug/dL — ABNORMAL HIGH

## 2024-02-01 LAB — GROWTH HORMONE: Growth Hormone: 2.2 ng/mL (ref <=7.1)

## 2024-02-01 LAB — HEAVY METALS PANEL, BLOOD
Arsenic: <3 ug/L (ref <23)
Lead: <1 ug/dL (ref <3.5)
Mercury, B: <5 ug/L (ref <11)

## 2024-02-01 LAB — ACTH: C206 ACTH: 24 pg/mL (ref 6–50)

## 2024-02-01 LAB — ESTROGENS, TOTAL: Estrogen: 81 pg/mL

## 2024-02-01 LAB — PROLACTIN: Prolactin: 21.1 ng/mL

## 2024-02-01 LAB — PROGESTERONE: Progesterone: <0.5 ng/mL

## 2024-02-02 ENCOUNTER — Other Ambulatory Visit: Payer: Self-pay | Admitting: Adult Health

## 2024-02-02 ENCOUNTER — Telehealth: Payer: Self-pay | Admitting: *Deleted

## 2024-02-02 DIAGNOSIS — M5412 Radiculopathy, cervical region: Secondary | ICD-10-CM

## 2024-02-02 MED ORDER — DEXCOM G7 SENSOR MISC: 0 refills | Status: DC

## 2024-02-02 NOTE — Telephone Encounter (Signed)
 Copied from CRM [id number]. Topic: Clinical - Medication Refill >> Feb 02, 2024  3:55 PM Shardie S wrote: Medication: Continuous Glucose Sensor (DEXCOM G7 SENSOR) MISC  Has the patient contacted their pharmacy? Yes (Agent: If no, request that the patient contact the pharmacy for the refill. If patient does not wish to contact the pharmacy document the reason why and proceed with request.) (Agent: If yes, when and what did the pharmacy advise?)  This is the patient's preferred pharmacy:  [id number] - [place], [place] - [address] [place] [address] Phone: [phone] Fax: [phone]  Is this the correct pharmacy for this prescription? Yes If no, delete pharmacy and type the correct one.   Has the prescription been filled recently? No  Is the patient out of the medication? Yes  Has the patient been seen for an appointment in the last year OR does the patient have an upcoming appointment? Yes  Can we respond through MyChart? Yes  Agent: Please be advised that Rx refills may take up to 3 business days. We ask that you follow-up with your pharmacy.

## 2024-02-02 NOTE — Telephone Encounter (Signed)
 Copied from CRM [id number]. Topic: Clinical - Lab/Test Results >> Feb 02, 2024  3:56 PM Alysia Jumbo S wrote: Reason for CRM: Patient has additional questions about lab results, requesting a callback from provider/nurse.

## 2024-02-03 NOTE — Telephone Encounter (Signed)
Called pt multiple times no answer

## 2024-02-08 ENCOUNTER — Ambulatory Visit: Payer: Self-pay

## 2024-02-09 ENCOUNTER — Other Ambulatory Visit: Payer: Self-pay | Admitting: Adult Health

## 2024-02-09 DIAGNOSIS — M5412 Radiculopathy, cervical region: Secondary | ICD-10-CM

## 2024-02-15 ENCOUNTER — Ambulatory Visit
Admission: RE | Admit: 2024-02-15 | Discharge: 2024-02-15 | Disposition: A | Discharge status: Home / Self Care | Source: Ambulatory Visit | Attending: Adult Health | Admitting: Adult Health

## 2024-02-15 DIAGNOSIS — R6883 Chills (without fever): Secondary | ICD-10-CM | POA: Diagnosis not present

## 2024-02-15 DIAGNOSIS — R232 Flushing: Secondary | ICD-10-CM

## 2024-02-15 DIAGNOSIS — R61 Generalized hyperhidrosis: Secondary | ICD-10-CM

## 2024-02-26 NOTE — Progress Notes (Unsigned)
 [name], female    DOB: [birth year]    MRN: [id number]   Brief patient profile:  15   yowf  active smoker with passive smoke with tendency to bad sinus infections  referred to pulmonary clinic in [place]  12/01/2023 by Dr Rosaria Common  for doe onset  2024 worse x Aug 2024 ? While on macrodantin ?    History of Present Illness  12/01/2023  Pulmonary/ 1st office eval/ Dvontae Ruan / [place] [facility]  Chief Complaint  Patient presents with   Consult    Sob at rest and activity    Dyspnea:  mb and back  lived there x 20 years is much more difficult and stops half way back.  50 ft flat  while on macrodantin  per [name]  Cough  worse x 6 months > yellowish in ams Sleep:  sleeps on couch props up against arm of cough and pillow  SABA use: none  02: none  Rec Breztri  Take 2 puffs first thing in am and then another 2 puffs about 12 hours later.  Work on inhaler technique:   My office will be contacting you by phone for referral for PFTs and Lung cancer screening (low dose CT)  Stop nitrofurantoin   The key is to stop smoking completely before smoking completely stops you!      02/29/2024  f/u ov/[place] office/Noella Kipnis re: GOLD 0/AB  maint on no rx  / still smoking  Chief Complaint  Patient presents with   Follow-up    Breathing has been worse "because the allergies are worse"- mold allergy and states her home is full of mold. She gets winded walking to the mailbox. She has some PND and cough- prod with clear sputum.  Dyspnea:  50 ft slt uphill back to house from mb  Cough: clear / worse in am  Sleeping: arm of couch  and one pillow some chest tightness      02: none   Lung cancer screening: refer  02/29/2024    No obvious day to day or daytime variability or assoc excess/ purulent sputum or mucus plugs or hemoptysis or cp or chest tightness, subjective wheeze or overt sinus or hb symptoms.    Also denies any obvious fluctuation of symptoms with weather or environmental changes or other  aggravating or alleviating factors except as outlined above   No unusual exposure hx or h/o childhood pna/ asthma or knowledge of premature birth.  Current Allergies, Complete Past Medical History, Past Surgical History, Family History, and Social History were reviewed in [name] record.  ROS  The following are not active complaints unless bolded Hoarseness, sore throat, dysphagia, dental problems, itching, sneezing,  nasal congestion or discharge of excess mucus or purulent secretions, ear ache,   fever, chills, sweats, unintended wt loss or wt gain, classically pleuritic or exertional cp,  orthopnea pnd or arm/hand swelling  or leg swelling, presyncope, palpitations, abdominal pain, anorexia, nausea, vomiting, diarrhea  or change in bowel habits or change in bladder habits, change in stools or change in urine, dysuria, hematuria,  rash, arthralgias, visual complaints, headache, numbness, weakness or ataxia or problems with walking or coordination,  change in mood or  memory.        Current Meds  Medication Sig   amLODipine  (NORVASC ) 5 MG tablet Take 1 tablet by mouth once daily   aspirin  EC 81 MG tablet Take 1 tablet (81 mg total) by mouth daily. Swallow whole.   busPIRone  (BUSPAR ) 15 MG tablet TAKE  1 TABLET BY MOUTH THREE TIMES DAILY   Cholecalciferol (VITAMIN D3) 2000 units TABS Take 1 tablet by mouth daily.   clopidogrel  (PLAVIX ) 75 MG tablet Take 1 tablet (75 mg total) by mouth daily.   Continuous Blood Gluc Receiver (DEXCOM G7 RECEIVER) DEVI Use with Dexcom G7 sensory   Continuous Glucose Sensor (DEXCOM G7 SENSOR) MISC USE AS DIRECTED EVERY 10 DAYS   FLUoxetine  (PROZAC ) 40 MG capsule Take 1 capsule by mouth once daily   glipiZIDE  (GLUCOTROL  XL) 10 MG 24 hr tablet Take 1 tablet by mouth once daily with breakfast   isosorbide  mononitrate (IMDUR ) 60 MG 24 hr tablet TAKE 1 & 1/2 (ONE & ONE-HALF) TABLETS BY MOUTH ONCE DAILY   LANTUS  SOLOSTAR 100 UNIT/ML Solostar Pen  10 units in the morning and 12 units in the evening   levETIRAcetam  (KEPPRA ) 250 MG tablet TAKE 1 TABLET BY MOUTH THREE TIMES DAILY   nitroGLYCERIN  (NITROSTAT ) 0.4 MG SL tablet Place 1 tablet (0.4 mg total) under the tongue every 5 (five) minutes x 3 doses as needed for chest pain.   nystatin  cream (MYCOSTATIN ) Apply 1 application topically 2 (two) times daily.   pregabalin  (LYRICA ) 150 MG capsule Take 1 capsule (150 mg total) by mouth 2 (two) times daily.   propranolol  (INDERAL ) 40 MG tablet Take 1 tablet by mouth twice daily           Past Medical History:  Diagnosis Date   Acute MI, inferior wall, initial episode of care [facility]) 07/08/2012   Cath-07/08/11 -distal RCA stent DES (99%), LAD 20-30%. EF 50% inferior hypokinesis  (infarct)   Anxiety    Asthma    Coronary artery disease    Depression    Diabetes mellitus    Fibromyalgia    Leukocytosis    Monocytosis    Peripheral arterial disease (HCC) 01/23/2013   Tobacco use disorder 07/08/2012      Objective:      Wt Readings from Last 3 Encounters:  02/29/24 153 lb 9.6 oz (69.7 kg)  01/26/24 150 lb (68 kg)  12/14/23 151 lb (68.5 kg)      Vital signs reviewed  02/29/2024  - Note at rest 02 sats  98% on RA   General appearance:    amb pleasant wf nad         HEENT : Oropharynx  clar   Nasal turbinates nl    NECK :  without  apparent JVD/ palpable Nodes/TM    LUNGS: no acc muscle use,  Min barrel  contour chest wall with bilateral  slightly decreased bs s audible wheeze and  without cough on insp or exp maneuvers and min  Hyperresonant  to  percussion bilaterally    CV:  RRR  no s3 or murmur or increase in P2, and no edema   ABD:  soft and nontender with pos end  insp Hoover's  in the supine position.  No bruits or organomegaly appreciated   MS:  Nl gait/ ext warm without deformities Or obvious joint restrictions  calf tenderness, cyanosis or clubbing     SKIN: warm and dry without lesions    NEURO:  alert, approp,  nl sensorium with  no motor or cerebellar deficits apparent.                Assessment

## 2024-02-28 ENCOUNTER — Ambulatory Visit: Admitting: Internal Medicine

## 2024-02-28 ENCOUNTER — Ambulatory Visit (HOSPITAL_COMMUNITY)
Admission: RE | Admit: 2024-02-28 | Discharge: 2024-02-28 | Disposition: A | Discharge status: Home / Self Care | Source: Ambulatory Visit | Attending: Internal Medicine | Admitting: Internal Medicine

## 2024-02-28 DIAGNOSIS — J4489 Other specified chronic obstructive pulmonary disease: Secondary | ICD-10-CM | POA: Insufficient documentation

## 2024-02-28 DIAGNOSIS — F17200 Nicotine dependence, unspecified, uncomplicated: Secondary | ICD-10-CM | POA: Diagnosis not present

## 2024-02-28 LAB — PULMONARY FUNCTION TEST
DL/VA % pred: 41 %
DL/VA: 1.74 ml/min/mmHg/L
DLCO unc % pred: 31 %
DLCO unc: 5.95 ml/min/mmHg
FEF 25-75 Post: 1.48 L/s
FEF 25-75 Pre: 0.17 L/s
FEF2575-%Change-Post: 755 %
FEF2575-%Pred-Post: 73 %
FEF2575-%Pred-Pre: 8 %
FEV1-%Change-Post: 100 %
FEV1-%Pred-Post: 59 %
FEV1-%Pred-Pre: 29 %
FEV1-Post: 1.37 L
FEV1-Pre: 0.68 L
FEV1FVC-%Change-Post: 73 %
FEV1FVC-%Pred-Pre: 57 %
FEV6-%Change-Post: 24 %
FEV6-%Pred-Post: 60 %
FEV6-%Pred-Pre: 48 %
FEV6-Post: 1.75 L
FEV6-Pre: 1.41 L
FEV6FVC-%Change-Post: 10 %
FEV6FVC-%Pred-Post: 104 %
FEV6FVC-%Pred-Pre: 94 %
FVC-%Change-Post: 15 %
FVC-%Pred-Post: 59 %
FVC-%Pred-Pre: 51 %
FVC-Post: 1.8 L
FVC-Pre: 1.56 L
Post FEV1/FVC ratio: 76 %
Post FEV6/FVC ratio: 100 %
Pre FEV1/FVC ratio: 44 %
Pre FEV6/FVC Ratio: 90 %
RV % pred: 159 %
RV: 3.28 L
TLC % pred: 108 %
TLC: 5.33 L

## 2024-02-28 MED ORDER — ALBUTEROL SULFATE (2.5 MG/3ML) 0.083% IN NEBU
2.5000 mg | INHALATION_SOLUTION | Freq: Once | RESPIRATORY_TRACT | Status: AC
  Administered 2024-02-28: 2.5 mg via RESPIRATORY_TRACT

## 2024-02-29 ENCOUNTER — Encounter: Payer: Self-pay | Admitting: Internal Medicine

## 2024-02-29 ENCOUNTER — Ambulatory Visit (INDEPENDENT_AMBULATORY_CARE_PROVIDER_SITE_OTHER): Admitting: Internal Medicine

## 2024-02-29 ENCOUNTER — Other Ambulatory Visit: Payer: Self-pay | Admitting: Adult Health

## 2024-02-29 VITALS — BP 112/60 | HR 81 | Temp 97.0°F | Ht 63.5 in | Wt 153.6 lb

## 2024-02-29 DIAGNOSIS — F17210 Nicotine dependence, cigarettes, uncomplicated: Secondary | ICD-10-CM | POA: Diagnosis not present

## 2024-02-29 DIAGNOSIS — R0602 Shortness of breath: Secondary | ICD-10-CM | POA: Diagnosis not present

## 2024-02-29 DIAGNOSIS — J4489 Other specified chronic obstructive pulmonary disease: Secondary | ICD-10-CM

## 2024-02-29 MED ORDER — BUDESONIDE-FORMOTEROL FUMARATE 80-4.5 MCG/ACT IN AERO
INHALATION_SPRAY | RESPIRATORY_TRACT | 12 refills | Status: AC
Start: 2024-02-29 — End: ?

## 2024-02-29 MED ORDER — AMOXICILLIN-POT CLAVULANATE 875-125 MG PO TABS: 1.0000 | ORAL_TABLET | Freq: Two times a day (BID) | ORAL | 0 refills | Status: DC

## 2024-02-29 NOTE — Patient Instructions (Addendum)
 Symbicort 80 Take 2 puffs first thing in am and then another 2 puffs about 12 hours later.     Work on inhaler technique:  relax and gently blow all the way out then take a nice smooth full deep breath back in, triggering the inhaler at same time you start breathing in.  Hold breath in for at least  5 seconds if you can. Blow out Symbicort 80  thru nose. Rinse and gargle with water when done.  If mouth or throat bother you at all,  try brushing teeth/gums/tongue with arm and hammer toothpaste/ make a slurry and gargle and spit out.   The key is to stop smoking completely before smoking completely stops you!  My office will be contacting you by phone for referral to lung cancer screening   [phone]) - if you don't hear back from my office within one week,  please call us  back or notify us  thru MyChart and we'll address it right away.     Please schedule a follow up office visit in 6 weeks, call sooner if needed

## 2024-03-01 ENCOUNTER — Telehealth: Payer: Self-pay | Admitting: Internal Medicine

## 2024-03-01 ENCOUNTER — Other Ambulatory Visit: Payer: Self-pay | Admitting: Adult Health

## 2024-03-01 ENCOUNTER — Other Ambulatory Visit: Payer: Self-pay

## 2024-03-01 DIAGNOSIS — M5412 Radiculopathy, cervical region: Secondary | ICD-10-CM

## 2024-03-01 DIAGNOSIS — Z794 Long term (current) use of insulin: Secondary | ICD-10-CM

## 2024-03-01 MED ORDER — LANTUS SOLOSTAR 100 UNIT/ML ~~LOC~~ SOPN: PEN_INJECTOR | SUBCUTANEOUS | 2 refills | Status: DC

## 2024-03-01 NOTE — Assessment & Plan Note (Signed)
 Active smoker - 12/01/2023  After extensive coaching inhaler device,  effectiveness =    75% (short ti) on hfa training > try breztri  pending pfts  - 12/01/2023   Walked on RA  x  2  lap(s) =  approx 300  ft  @ slow pace, stopped due to tired/legs hurt / sob  with lowest 02 sats 93%   - PFT's  02/28/24  FEV1 1.37 (59 % ) ratio 0.76  p 100 % improvement from saba p 0 prior to study with DLCO  5.33 (108%)   and FV curve classically cocave   - 02/29/2024 rx symbicort  80 2bid   Surprising degreed of reversibility considering she is on inderol and could not tolerate upper airway side effects of Breztri  so low dose symbicort should be a good option   F/u in 6 weeks sooner if needed with all meds in hand using a trust but verify approach to confirm accurate Medication  Reconciliation The principal here is that until we are certain that the  patients are doing what we've asked, it makes no sense to ask them to do more.

## 2024-03-01 NOTE — Telephone Encounter (Signed)
 LVM for patient to call and discuss scheduling the 6 week follow up with Dr. Waymond Hailey

## 2024-03-01 NOTE — Assessment & Plan Note (Signed)
 Counseled re importance of smoking cessation but did not meet time criteria for separate billing    Low-dose CT lung cancer screening is recommended for patients who are 60-66 years of age with a 20+ pack-year history of smoking and who are currently smoking or quit <=15 years ago. No coughing up blood  No unintentional weight loss of > 15 pounds in the last 6 months - pt is eligible for scanning yearly until 14 y p she quits/ advised  Discussed in detail all the  indications, usual  risks and alternatives  relative to the benefits with patient who agrees to proceed with w/u as outlined.            Each maintenance medication was reviewed in detail including emphasizing most importantly the difference between maintenance and prns and under what circumstances the prns are to be triggered using an action plan format where appropriate.  Total time for H and P, chart review, counseling, reviewing hfa  device(s) and generating customized AVS unique to this office visit / same day charting = 31 min

## 2024-03-02 ENCOUNTER — Ambulatory Visit: Payer: Self-pay | Admitting: Internal Medicine

## 2024-03-07 ENCOUNTER — Other Ambulatory Visit: Payer: Self-pay | Admitting: Adult Health

## 2024-03-07 DIAGNOSIS — E118 Type 2 diabetes mellitus with unspecified complications: Secondary | ICD-10-CM

## 2024-03-07 DIAGNOSIS — R569 Unspecified convulsions: Secondary | ICD-10-CM

## 2024-03-11 ENCOUNTER — Encounter: Payer: Self-pay | Admitting: Adult Health

## 2024-03-11 DIAGNOSIS — E118 Type 2 diabetes mellitus with unspecified complications: Secondary | ICD-10-CM

## 2024-03-11 DIAGNOSIS — R569 Unspecified convulsions: Secondary | ICD-10-CM

## 2024-03-12 MED ORDER — GLIPIZIDE ER 10 MG PO TB24: 10.0000 mg | ORAL_TABLET | Freq: Every day | ORAL | 0 refills | Status: DC

## 2024-03-12 MED ORDER — AMLODIPINE BESYLATE 5 MG PO TABS: 5.0000 mg | ORAL_TABLET | Freq: Every day | ORAL | 0 refills | Status: DC

## 2024-03-12 NOTE — Telephone Encounter (Signed)
 I sent in the Glipizide  and amlodipine . Ok to send in busiprone and Keppra ? The warning sign came up

## 2024-03-13 MED ORDER — BUSPIRONE HCL 15 MG PO TABS
15.0000 mg | ORAL_TABLET | Freq: Three times a day (TID) | ORAL | 0 refills | Status: DC
Start: 2024-03-13 — End: 2024-06-14

## 2024-03-13 MED ORDER — LEVETIRACETAM 250 MG PO TABS: 250.0000 mg | ORAL_TABLET | Freq: Three times a day (TID) | ORAL | 0 refills | Status: DC

## 2024-03-13 NOTE — Addendum Note (Signed)
 Addended by: Twyla Galeazzi R on: 03/13/2024 01:14 PM   Modules accepted: Orders

## 2024-03-15 ENCOUNTER — Encounter: Payer: Self-pay | Admitting: Adult Health

## 2024-03-15 ENCOUNTER — Ambulatory Visit (INDEPENDENT_AMBULATORY_CARE_PROVIDER_SITE_OTHER): Admitting: Adult Health

## 2024-03-15 VITALS — BP 92/60 | HR 71 | Temp 97.9°F | Ht 63.5 in | Wt 155.0 lb

## 2024-03-15 DIAGNOSIS — I10 Essential (primary) hypertension: Secondary | ICD-10-CM

## 2024-03-15 DIAGNOSIS — Z7984 Long term (current) use of oral hypoglycemic drugs: Secondary | ICD-10-CM | POA: Diagnosis not present

## 2024-03-15 DIAGNOSIS — Z794 Long term (current) use of insulin: Secondary | ICD-10-CM | POA: Diagnosis not present

## 2024-03-15 DIAGNOSIS — E119 Type 2 diabetes mellitus without complications: Secondary | ICD-10-CM | POA: Diagnosis not present

## 2024-03-15 LAB — POCT GLYCOSYLATED HEMOGLOBIN (HGB A1C): Hemoglobin A1C: 7.5 % — AB (ref 4.0–5.6)

## 2024-03-15 NOTE — Progress Notes (Signed)
 Subjective:    Patient ID: [name], female    DOB: 1955-10-25, 68 y.o.   MRN: [id number]  HPI 68 year old female who  has a past medical history of Acute MI, inferior wall, initial episode of care [facility]) (07/08/2012), Anxiety, Asthma, Coronary artery disease, Depression, Diabetes mellitus, Fibromyalgia, Leukocytosis, Monocytosis, Peripheral arterial disease (HCC) (01/23/2013), and Tobacco use disorder (07/08/2012).  She presents to the office today for follow up  DM  Type 2- managed with Glipizide  10 mg ER BID and Lantus  10 units in the morning and 14 units in the evening.  She reports that her blood sugars have been all over the place.  For the most part in the evening her blood sugars go into the 300s out that morning her blood sugars up to the 40s.  She does not necessarily watch what she eats in the evening and does not eat much in the morning but is trying to eat more in the morning.  Lab Results  Component Value Date   HGBA1C 7.5 (A) 03/15/2024   HGBA1C 7.3 (A) 12/14/2023   HGBA1C 7.6 (H) 02/23/2023   HTN - managed with Norvac 5 mg daily and inderal  40 mg BID. She reports that her blood pressure has been running lower than normal, ito the 90's systolic, she does feel dizzy and lightheadedness BP Readings from Last 3 Encounters:  03/15/24 92/60  02/29/24 112/60  01/26/24 130/70    Review of Systems See HPI   Past Medical History:  Diagnosis Date   Acute MI, inferior wall, initial episode of care (HCC) 07/08/2012   Cath-07/08/11 -distal RCA stent DES (99%), LAD 20-30%. EF 50% inferior hypokinesis  (infarct)   Anxiety    Asthma    Coronary artery disease    Depression    Diabetes mellitus    Fibromyalgia    Leukocytosis    Monocytosis    Peripheral arterial disease (HCC) 01/23/2013   Tobacco use disorder 07/08/2012    Social History   Socioeconomic History   Marital status: Married    Spouse name: Not on file   Number of children: 3   Years of education:  college   Highest education level: Bachelor's degree (e.g., BA, AB, BS)  Occupational History   Not on file  Tobacco Use   Smoking status: Every Day    Current packs/day: 1.00    Average packs/day: 1 pack/day for 43.0 years (43.0 ttl pk-yrs)    Types: Cigarettes   Smokeless tobacco: Never   Tobacco comments:    Currently 1/2 ppd 02/29/24  Substance and Sexual Activity   Alcohol use: No    Alcohol/week: 0.0 standard drinks of alcohol    Comment: Rarely   Drug use: No   Sexual activity: Yes  Other Topics Concern   Not on file  Social History Narrative   Lives with husband, married for 20 years.      They have three grown chlildren.   She is not currently working and is on disability   Highest level of education:  Engineer, maintenance (IT)   Pets: two dogs and three rabbits.       Husband is a long Production assistant, radio.       Is unable to enjoy any activities because of body pains.       Social Drivers of Corporate investment banker Strain: Low Risk  (04/13/2023)   Overall Financial Resource Strain (CARDIA)    Difficulty of Paying Living Expenses: Not hard at  all  Food Insecurity: Patient Declined (05/13/2023)   Hunger Vital Sign    Worried About Running Out of Food in the Last Year: Patient declined    Ran Out of Food in the Last Year: Patient declined  Transportation Needs: Unmet Transportation Needs (05/13/2023)   PRAPARE - Administrator, Civil Service (Medical): Yes    Lack of Transportation (Non-Medical): Yes  Physical Activity: Unknown (05/13/2023)   Exercise Vital Sign    Days of Exercise per Week: Patient declined    Minutes of Exercise per Session: 0 min  Recent Concern: Physical Activity - Inactive (04/13/2023)   Exercise Vital Sign    Days of Exercise per Week: 0 days    Minutes of Exercise per Session: 0 min  Stress: Stress Concern Present (05/13/2023)   [employer] of Occupational Health - Occupational Stress Questionnaire    Feeling of Stress : Very  much  Social Connections: Moderately Isolated (05/13/2023)   Social Connection and Isolation Panel    Frequency of Communication with Friends and Family: Once a week    Frequency of Social Gatherings with Friends and Family: Never    Attends Religious Services: 1 to 4 times per year    Active Member of [employer] or Organizations: No    Attends Banker Meetings: Never    Marital Status: Married  Catering manager Violence: Not At Risk (04/13/2023)   Humiliation, Afraid, Rape, and Kick questionnaire    Fear of Current or Ex-Partner: No    Emotionally Abused: No    Physically Abused: No    Sexually Abused: No    Past Surgical History:  Procedure Laterality Date   ABDOMINAL AORTAGRAM N/A 02/07/2013   Procedure: ABDOMINAL Tommi Fraise;  Surgeon: Wenona Hamilton, MD;  Location: [facility] CATH LAB;  Service: Cardiovascular;  Laterality: N/A;   ABDOMINAL AORTOGRAM W/LOWER EXTREMITY N/A 11/09/2023   Procedure: ABDOMINAL AORTOGRAM W/LOWER EXTREMITY;  Surgeon: Wenona Hamilton, MD;  Location: [facility] INVASIVE CV LAB;  Service: Cardiovascular;  Laterality: N/A;   CARDIAC CATHETERIZATION     CORONARY STENT PLACEMENT     CYST REMOVAL TRUNK  1977   tailbone   LEFT HEART CATHETERIZATION WITH CORONARY ANGIOGRAM N/A 07/07/2012   Procedure: LEFT HEART CATHETERIZATION WITH CORONARY ANGIOGRAM;  Surgeon: Odie Benne, MD;  Location: [facility] CATH LAB;  Service: Cardiovascular;  Laterality: N/A;   PERCUTANEOUS CORONARY STENT INTERVENTION (PCI-S)  07/07/2012   Procedure: PERCUTANEOUS CORONARY STENT INTERVENTION (PCI-S);  Surgeon: Odie Benne, MD;  Location: [facility] C[facility] LAB;  Service: Cardiovascular;;   PERIPHERAL VASCULAR BALLOON ANGIOPLASTY Right 11/09/2023   Procedure: PERIPHERAL VASCULAR BALLOON ANGIOPLASTY;  Surgeon: Wenona Hamilton, MD;  Location: [facility] INVASIVE CV LAB;  Service: Cardiovascular;  Laterality: Right;  Ext Iliac   PERIPHERAL VASCULAR INTERVENTION Right 11/09/2023   Procedure: PERIPHERAL  VASCULAR INTERVENTION;  Surgeon: Wenona Hamilton, MD;  Location: [facility] INVASIVE CV LAB;  Service: Cardiovascular;  Laterality: Right;  Ext Iliac   TONSILLECTOMY      Family History  Problem Relation Age of Onset   Kidney disease Mother    Diabetes Mother        DM Type II   Alzheimer's disease Mother    Diabetes Father        IDDM   Heart disease Father    Cancer Father        bone cancer died at 7   Cancer Sister        bone cancer at age 14  Multiple sclerosis Daughter    Diabetes Son 2       DM Type I   Heart disease Maternal Grandfather    Heart disease Paternal Grandmother    Diabetes Son 58       DM Type I    Allergies  Allergen Reactions   Erythromycin Other (See Comments)   Statins     SEVERE MUSCLE PAIN - failed lipitor , Livalo , red yeast rice (lovastatin), and she thinks Zocor and pravastatin    Ace Inhibitors Cough    Patient refuses to take meds   Cymbalta [Duloxetine Hcl]     Drowsiness   Doxycycline Other (See Comments)    diarrhea   Jardiance [Empagliflozin]     Yeast infections     Fentanyl  Anxiety    Current Outpatient Medications on File Prior to Visit  Medication Sig Dispense Refill   amLODipine  (NORVASC ) 5 MG tablet Take 1 tablet (5 mg total) by mouth daily. 90 tablet 0   aspirin  EC 81 MG tablet Take 1 tablet (81 mg total) by mouth daily. Swallow whole.     budesonide -formoterol  (SYMBICORT ) 80-4.5 MCG/ACT inhaler Take 2 puffs first thing in am and then another 2 puffs about 12 hours later. 1 each 12   busPIRone  (BUSPAR ) 15 MG tablet Take 1 tablet (15 mg total) by mouth 3 (three) times daily. 270 tablet 0   Cholecalciferol (VITAMIN D3) 2000 units TABS Take 1 tablet by mouth daily.     clopidogrel  (PLAVIX ) 75 MG tablet Take 1 tablet (75 mg total) by mouth daily. 30 tablet 6   Continuous Blood Gluc Receiver (DEXCOM G7 RECEIVER) DEVI Use with Dexcom G7 sensory 1 each 0   Continuous Glucose Sensor (DEXCOM G7 SENSOR) MISC USE AS DIRECTED EVERY  10   DAYS 3 each 0   FLUoxetine  (PROZAC ) 40 MG capsule Take 1 capsule by mouth once daily 90 capsule 0   glipiZIDE  (GLUCOTROL  XL) 10 MG 24 hr tablet Take 1 tablet (10 mg total) by mouth daily with breakfast. 90 tablet 0   isosorbide  mononitrate (IMDUR ) 60 MG 24 hr tablet TAKE 1 & 1/2 (ONE & ONE-HALF) TABLETS BY MOUTH ONCE DAILY 135 tablet 0   LANTUS  SOLOSTAR 100 UNIT/ML Solostar Pen 10 units in the morning and 12 units in the evening 6 mL 2   levETIRAcetam  (KEPPRA ) 250 MG tablet Take 1 tablet (250 mg total) by mouth 3 (three) times daily. 270 tablet 0   nitroGLYCERIN  (NITROSTAT ) 0.4 MG SL tablet Place 1 tablet (0.4 mg total) under the tongue every 5 (five) minutes x 3 doses as needed for chest pain. 25 tablet 3   nystatin  cream (MYCOSTATIN ) Apply 1 application topically 2 (two) times daily. 30 g 2   pravastatin  (PRAVACHOL ) 80 MG tablet Take 1 tablet (80 mg total) by mouth every evening. 90 tablet 3   pregabalin  (LYRICA ) 150 MG capsule Take 1 capsule (150 mg total) by mouth 2 (two) times daily. 60 capsule 2   propranolol  (INDERAL ) 40 MG tablet Take 1 tablet by mouth twice daily 180 tablet 0   amoxicillin -clavulanate (AUGMENTIN ) 875-125 MG tablet Take 1 tablet by mouth 2 (two) times daily. (Patient not taking: Reported on 02/29/2024) 20 tablet 0   No current facility-administered medications on file prior to visit.    BP 92/60   Pulse 71   Temp 97.9 F (36.6 C) (Oral)   Ht 5' 3.5 (1.613 m)   Wt 155 lb (70.3 kg)   SpO2 97%   BMI  27.03 kg/m       Objective:   Physical Exam Vitals and nursing note reviewed.  Constitutional:      Appearance: Normal appearance.   Cardiovascular:     Rate and Rhythm: Normal rate and regular rhythm.     Pulses: Normal pulses.     Heart sounds: Normal heart sounds.  Pulmonary:     Effort: Pulmonary effort is normal.     Breath sounds: Normal breath sounds.   Skin:    General: Skin is warm and dry.   Neurological:     General: No focal deficit present.      Mental Status: She is alert and oriented to person, place, and time.   Psychiatric:        Mood and Affect: Mood normal.        Behavior: Behavior normal.        Thought Content: Thought content normal.        Judgment: Judgment normal.       Assessment & Plan:  1. Diabetes mellitus treated with oral medication (HCC) (Primary)  - POC HgB A1c- 7.5 - she is interested in seeing endocrinology so we will refer her there - Continue with current medication  - Ambulatory referral to Endocrinology  2. Current use of insulin  (HCC) - - Ambulatory referral to Endocrinology  3. Essential hypertension - Hypotensive today. Will have her D/c Norvasc   - Continue to monitor at home and return precautions reviewed   Alto Atta, NP

## 2024-03-15 NOTE — Patient Instructions (Signed)
 I am going to have you stop Norvasc 

## 2024-03-16 ENCOUNTER — Other Ambulatory Visit: Payer: Self-pay | Admitting: Adult Health

## 2024-03-16 DIAGNOSIS — M5412 Radiculopathy, cervical region: Secondary | ICD-10-CM

## 2024-03-16 NOTE — Telephone Encounter (Signed)
 Okay for refill?

## 2024-03-19 ENCOUNTER — Telehealth: Payer: Self-pay

## 2024-03-19 DIAGNOSIS — Z87891 Personal history of nicotine dependence: Secondary | ICD-10-CM

## 2024-03-19 DIAGNOSIS — Z122 Encounter for screening for malignant neoplasm of respiratory organs: Secondary | ICD-10-CM

## 2024-03-19 DIAGNOSIS — F17210 Nicotine dependence, cigarettes, uncomplicated: Secondary | ICD-10-CM

## 2024-03-19 NOTE — Telephone Encounter (Signed)
.  Lung Cancer Screening Narrative/Criteria Questionnaire (Cigarette Smokers Only- No Cigars/Pipes/vapes)   [name]   SDMV:04/04/2024 at 4:00pm          [birth year]   LDCT: 04/06/2024 at 4:30 pm at [facility]    68 y.o.   Phone: [phone]  Lung Screening Narrative (confirm age 2-77 yrs Medicare / 50-80 yrs Private pay insurance)   Insurance information:UHC Medicare   Referring Provider: [name]   This screening involves an initial phone call with a team member from our program. It is called a shared decision making visit. The initial meeting is required by  insurance and Medicare to make sure you understand the program. This appointment takes about 15-20 minutes to complete. You will complete the screening scan at your scheduled date/time.  This scan takes about 5-10 minutes to complete. You can eat and drink normally before and after the scan.  Criteria questions for Lung Cancer Screening:   Are you a current or former smoker? Current Age began smoking: 14   If you are a former smoker, what year did you quit smoking? Never quit (within 15 yrs)   To calculate your smoking history, I need an accurate estimate of how many packs of cigarettes you smoked per day and for how many years. (Not just the number of PPD you are now smoking)   Years smoking 54 x Packs per day 1 = Pack years 56   (at least 20 pack yrs)   (Make sure they understand that we need to know how much they have smoked in the past, not just the number of PPD they are smoking now)  Do you have a personal history of cancer?  No    Do you have a family history of cancer? Yes  (cancer type and and relative) Father Bone and Sister Bone Cancer   Are you coughing up blood?  Yes  Have you had unexplained weight loss of 15 lbs or more in the last 6 months? Yes  It looks like you meet all criteria.  When would be a good time for us  to schedule you for this screening?   Additional information:

## 2024-03-29 ENCOUNTER — Other Ambulatory Visit: Payer: Self-pay | Admitting: Adult Health

## 2024-03-29 DIAGNOSIS — M5412 Radiculopathy, cervical region: Secondary | ICD-10-CM

## 2024-04-04 ENCOUNTER — Ambulatory Visit

## 2024-04-06 ENCOUNTER — Ambulatory Visit (HOSPITAL_COMMUNITY)

## 2024-04-12 ENCOUNTER — Other Ambulatory Visit: Payer: Self-pay | Admitting: Adult Health

## 2024-04-12 DIAGNOSIS — I2119 ST elevation (STEMI) myocardial infarction involving other coronary artery of inferior wall: Secondary | ICD-10-CM

## 2024-04-17 NOTE — Progress Notes (Signed)
 [facility] Quality Team Note  Name: [name] Date of Birth: November 12, 1955 MRN: [id number] Date: 04/17/2024  [facility] Quality Team has reviewed this patient's chart, please see recommendations below:  [facility] Quality Other; (Chart reviewed for BCS and COL screenings. No record found. Outreach to PCP)

## 2024-04-19 ENCOUNTER — Telehealth: Payer: Self-pay | Admitting: Internal Medicine

## 2024-04-19 NOTE — Telephone Encounter (Signed)
 Patient needs to have her SDMV before ct can be set up

## 2024-04-19 NOTE — Telephone Encounter (Unsigned)
 Copied from CRM #[id number]. Topic: Appointments - Scheduling Inquiry for Clinic >> Apr 19, 2024  3:54 PM Corean SAUNDERS wrote: Reason for CRM: Please call patient to schedule her CT lung cancer screening, as her previous CT appointment was cancelled without her knowledge.

## 2024-04-20 NOTE — Telephone Encounter (Signed)
 Returned call but goes straight to VM.  Left message for rescheduling sdmv and LDCT

## 2024-04-20 NOTE — Telephone Encounter (Signed)
 Returned call. LVM to call office and reschedule SDMV and LDCT.

## 2024-04-25 ENCOUNTER — Ambulatory Visit: Admitting: Internal Medicine

## 2024-05-01 ENCOUNTER — Telehealth: Payer: Self-pay

## 2024-05-01 DIAGNOSIS — E118 Type 2 diabetes mellitus with unspecified complications: Secondary | ICD-10-CM

## 2024-05-01 MED ORDER — FREESTYLE LIBRE 3 READER DEVI: 0 refills | Status: DC

## 2024-05-01 MED ORDER — FREESTYLE LIBRE 3 PLUS SENSOR MISC: 0 refills | Status: DC

## 2024-05-01 NOTE — Progress Notes (Signed)
 05/01/2024 Name: [name] MRN: [id number] DOB: [birth year]  Chief Complaint  Patient presents with   Medication Management   Diabetes    [name] is a 68 y.o. year old female who presented for a telephone visit.   They were referred to the pharmacist by their Case Management Team  (team Supervisor Catie Rudy) for assistance in managing complex medication management.    Subjective:  Care Team: Primary Care Provider: Merna Huxley, NP  Medication Access/Adherence  Current Pharmacy:  [facility] [address], [place] - [address] [place] [address] Phone: [phone] Fax: [phone]   Patient reports affordability concerns with their medications: No  - notes lantus  is expensive, getting okay right now Patient reports access/transportation concerns to their pharmacy: No  Patient reports adherence concerns with their medications:  No     Diabetes:  Current medications: Lantus  10 units in morning and 14 in the evening, Glipizide  xl 10mg  Medications tried in the past: Metformin , Onglyza, Ozempic , Victoza, Kombiglyze  Current glucose readings:  Did not discuss specific readings. Patient reports she feels the dexcom sensor is not being accurate enough to be relied upon. Still pricking her finger as backup.  Interested in trying the freestyle libre 3 plus instead  Has not scheduled appt with [facility] endo yet, despite referral being placed in mid-June   Current medication access support: None  Hypertension:  Current medications: None Medications previously tried: Propranolol , Amlodipine , hydrochlorothiazide , Metoprolol   Patient has a validated, automated, upper arm home BP cuff Current blood pressure readings readings: not been checking recently  Objective:  Lab Results  Component Value Date   HGBA1C 7.5 (A) 03/15/2024    Lab Results  Component Value Date   CREATININE 1.41 (H) 01/27/2024   BUN 22 01/27/2024   NA  135 01/27/2024   K 3.7 01/27/2024   CL 103 01/27/2024   CO2 22 01/27/2024    Lab Results  Component Value Date   CHOL 226 (H) 10/18/2023   HDL 50 10/18/2023   LDLCALC 134 (H) 10/18/2023   LDLDIRECT 72.0 02/24/2022   TRIG 233 (H) 10/18/2023   CHOLHDL 4.5 (H) 10/18/2023    Medications Reviewed Today     Reviewed by Lionell Jon DEL, RPH (Pharmacist) on 05/01/24 at 1127  Med List Status: <None>   Medication Order Taking? Sig Documenting Provider Last Dose Status Informant  aspirin  EC 81 MG tablet [id number]  Take 1 tablet (81 mg total) by mouth daily. Swallow whole. Darron Deatrice LABOR, MD  Active   budesonide -formoterol  (SYMBICORT ) 80-4.5 MCG/ACT inhaler [id number]  Take 2 puffs first thing in am and then another 2 puffs about 12 hours later. [name] Ozell NOVAK, MD  Active   busPIRone  (BUSPAR ) 15 MG tablet [id number]  Take 1 tablet (15 mg total) by mouth 3 (three) times daily. [name], Huxley, NP  Active   Cholecalciferol (VITAMIN D3) 2000 units TABS [id number]  Take 1 tablet by mouth daily. [provider]  Active   clopidogrel  (PLAVIX ) 75 MG tablet [id number]  Take 1 tablet (75 mg total) by mouth daily. Darron Deatrice LABOR, MD  Active   Continuous Blood Gluc Receiver WILMOT G7 [place]) [place] [id number]  Use with Dexcom G7 sensory Merna, Huxley, NP  Active   Continuous Glucose Receiver (FREESTYLE LIBRE 3 READER) DEVI [id number] Yes Use as directed with sensor to monitor blood sugar continuously. Dx. E11.8 Merna Huxley, NP  Active   Continuous Glucose Sensor (DEXCOM G7 [place]) [place] [id number]  USE AS DIRECTED EVERY 10 DAYS [name], Darleene, NP  Active   Continuous Glucose Sensor (FREESTYLE LIBRE 3 PLUS SENSOR) MISC [id number] Yes Use to monitor blood sugar continuously. Change sensor every 15 days. Dx. E11.8 Merna Darleene, NP  Active   FLUoxetine  (PROZAC ) 40 MG capsule [id number]  Take 1 capsule by mouth once daily [name], Darleene, NP  Active   glipiZIDE  (GLUCOTROL  XL) 10 MG 24 hr tablet  [id number]  Take 1 tablet (10 mg total) by mouth daily with breakfast. [name], Cory, NP  Active   isosorbide  mononitrate (IMDUR ) 60 MG 24 hr tablet [id number]  TAKE 1 & 1/2 (ONE & ONE-HALF) TABLETS BY MOUTH ONCE DAILY [name], Darleene, NP  Active   LANTUS  SOLOSTAR 100 UNIT/ML Solostar Pen [id number]  10 units in the morning and 12 units in the evening [name], Darleene, NP  Active   levETIRAcetam  (KEPPRA ) 250 MG tablet [id number]  Take 1 tablet (250 mg total) by mouth 3 (three) times daily. [name], Darleene, NP  Active   nitroGLYCERIN  (NITROSTAT ) 0.4 MG SL tablet [id number]  Place 1 tablet (0.4 mg total) under the tongue every 5 (five) minutes x 3 doses as needed for chest pain. Lavona Agent, MD  Active   nystatin  cream (MYCOSTATIN ) [id number]  Apply 1 application topically 2 (two) times daily. [name], Darleene, NP  Active   pravastatin  (PRAVACHOL ) 80 MG tablet [id number]  Take 1 tablet (80 mg total) by mouth every evening. Lavona Agent, MD  Expired 03/15/24 2359   pregabalin  (LYRICA ) 150 MG capsule [id number]  Take 1 capsule by mouth twice daily [name], Darleene, NP  Active   propranolol  (INDERAL ) 40 MG tablet [id number]  Take 1 tablet by mouth twice daily [name], Darleene, NP  Active               Assessment/Plan:   Diabetes: - Currently uncontrolled - Reviewed long term cardiovascular and renal outcomes of uncontrolled blood sugar - Reviewed goal A1c, goal fasting, and goal 2 hour post prandial glucose - Recommend to schedule appt with endo, provided phone number to contact for scheduling  - Patient denies personal or family history of multiple endocrine neoplasia type 2, medullary thyroid  cancer; personal history of pancreatitis or gallbladder disease. - Recommend to check glucose continuously, sending in order for freestyle sensors and reader at patient request     Hypertension: - Currently controlled - Reviewed long term cardiovascular and renal outcomes of uncontrolled blood pressure -  Reviewed appropriate blood pressure monitoring technique and reviewed goal blood pressure. Recommended to check home blood pressure and heart rate at least two times weekly   Follow Up Plan: 05/23/24  Jon VEAR [place], PharmD Clinical Pharmacist [phone]

## 2024-05-02 ENCOUNTER — Encounter: Payer: Self-pay | Admitting: *Deleted

## 2024-05-08 ENCOUNTER — Encounter: Payer: Self-pay | Admitting: *Deleted

## 2024-05-08 ENCOUNTER — Ambulatory Visit (INDEPENDENT_AMBULATORY_CARE_PROVIDER_SITE_OTHER): Admitting: *Deleted

## 2024-05-08 DIAGNOSIS — F17210 Nicotine dependence, cigarettes, uncomplicated: Secondary | ICD-10-CM

## 2024-05-08 NOTE — Progress Notes (Signed)
 Virtual Visit via Video Note  I connected with [name] on 05/08/24 at  2:30 PM EDT by a video enabled telemedicine application and verified that I am speaking with the correct person using two identifiers.  Location: Patient: in home Provider: [address] [place], [place], [address]     Shared Decision Making Visit Lung Cancer Screening Program [id number])   Eligibility: Age 68 y.o. Pack Years Smoking History Calculation 54 (# packs/per year x # years smoked) Recent History of coughing up blood  no Unexplained weight loss? no ( >Than 15 pounds within the last 6 months ) Prior History Lung / other cancer no (Diagnosis within the last 5 years already requiring surveillance chest CT Scans). Smoking Status Current Smoker Former Smokers: Years since quit:  NA  Quit Date: NA  Visit Components: Discussion included one or more decision making aids. yes Discussion included risk/benefits of screening. yes Discussion included potential follow up diagnostic testing for abnormal scans. yes Discussion included meaning and risk of over diagnosis. yes Discussion included meaning and risk of False Positives. yes Discussion included meaning of total radiation exposure. yes  Counseling Included: Importance of adherence to annual lung cancer LDCT screening. yes Impact of comorbidities on ability to participate in the program. yes Ability and willingness to under diagnostic treatment. yes  Smoking Cessation Counseling: Current Smokers:  Discussed importance of smoking cessation. yes Information about tobacco cessation classes and interventions provided to patient. yes Patient provided with ticket for LDCT Scan. yes Symptomatic Patient. no  Counseling NA Diagnosis Code: Tobacco Use Z72.0 Asymptomatic Patient yes  Counseling (Intermediate counseling: > three minutes counseling) [id number] Counseled patient 3-4 minutes regarding tobacco use.   Former Smokers:  Discussed the  importance of maintaining cigarette abstinence. yes Diagnosis Code: Personal History of Nicotine Dependence. S12.108 Information about tobacco cessation classes and interventions provided to patient. Yes Patient provided with ticket for LDCT Scan. yes Written Order for Lung Cancer Screening with LDCT placed in Epic. Yes (CT Chest Lung Cancer Screening Low Dose W/O CM) [id number] Z12.2-Screening of respiratory organs Z87.891-Personal history of nicotine dependence   Josette Ranger, RN 05/08/24

## 2024-05-08 NOTE — Patient Instructions (Signed)

## 2024-05-09 NOTE — Progress Notes (Signed)
 Counseled patient 3-4 minutes regarding tobacco use.

## 2024-05-17 ENCOUNTER — Other Ambulatory Visit: Payer: Self-pay | Admitting: Adult Health

## 2024-05-18 ENCOUNTER — Ambulatory Visit (HOSPITAL_COMMUNITY)
Admission: RE | Admit: 2024-05-18 | Discharge: 2024-05-18 | Disposition: A | Discharge status: Home / Self Care | Source: Ambulatory Visit | Attending: Acute Care | Admitting: Acute Care

## 2024-05-18 DIAGNOSIS — Z87891 Personal history of nicotine dependence: Secondary | ICD-10-CM | POA: Insufficient documentation

## 2024-05-18 DIAGNOSIS — Z122 Encounter for screening for malignant neoplasm of respiratory organs: Secondary | ICD-10-CM | POA: Diagnosis not present

## 2024-05-18 DIAGNOSIS — F17210 Nicotine dependence, cigarettes, uncomplicated: Secondary | ICD-10-CM | POA: Diagnosis not present

## 2024-05-23 ENCOUNTER — Other Ambulatory Visit

## 2024-05-29 ENCOUNTER — Telehealth: Payer: Self-pay

## 2024-05-29 NOTE — Telephone Encounter (Signed)
 Copied from CRM [id number]. Topic: Clinical - Medication Question >> May 29, 2024  2:18 PM Shardie S wrote: Reason for CRM: Patient states she is needing an increase on her insulin , LANTUS  SOLOSTAR 100 UNIT/ML Solostar Pen. She states the current amount is not a full 90 day supply. She is requesting a new prescription be sent to her pharmacy.  Callback # [phone]   [facility] [address] [place] - [address] [place] [address] Phone: [phone] Fax: [phone] Hours: Not open 24 hours

## 2024-05-30 ENCOUNTER — Other Ambulatory Visit: Payer: Self-pay | Admitting: Adult Health

## 2024-05-30 DIAGNOSIS — M5412 Radiculopathy, cervical region: Secondary | ICD-10-CM

## 2024-05-30 DIAGNOSIS — E118 Type 2 diabetes mellitus with unspecified complications: Secondary | ICD-10-CM

## 2024-05-30 NOTE — Telephone Encounter (Signed)
Please advise on dose.

## 2024-05-31 ENCOUNTER — Other Ambulatory Visit: Payer: Self-pay

## 2024-05-31 DIAGNOSIS — Z794 Long term (current) use of insulin: Secondary | ICD-10-CM

## 2024-05-31 MED ORDER — LANTUS SOLOSTAR 100 UNIT/ML ~~LOC~~ SOPN: PEN_INJECTOR | SUBCUTANEOUS | 2 refills | Status: DC

## 2024-05-31 NOTE — Telephone Encounter (Signed)
 Pt insulin  sent in for 90 day.

## 2024-05-31 NOTE — Telephone Encounter (Signed)
 Pt stated that she does not remember her readings. Pt stated that she started using a new sensor and her blood sugars are doing better. Pt stated that the reading from the sensor matches her finger stick. Pt stated she wants a 90 day supply of her insulin  bc she only received 30 day.

## 2024-06-04 ENCOUNTER — Telehealth: Payer: Self-pay | Admitting: Acute Care

## 2024-06-04 NOTE — Telephone Encounter (Signed)
 Call report:    IMPRESSION: 1. 9.2 mm anterior left upper lobe nodule. Lung-RADS 4A, suspicious. Follow up low-dose chest CT without contrast in 3 months (please use the following order, CT CHEST LCS NODULE FOLLOW-UP W/O CM) is recommended. Alternatively, PET may be considered when there is a solid component 8mm or larger. These results will be called to the ordering clinician or representative by the Radiologist Assistant, and communication documented in the PACS or [employer]. 2. Aortic atherosclerosis (ICD10-I70.0). Coronary artery calcification. 3.  Emphysema (ICD10-J43.9).     Electronically Signed   By: Newell Eke M.D.   On: 06/03/2024 13:31

## 2024-06-06 NOTE — Telephone Encounter (Signed)
 Called patient to review recent Lung CT results. No answer, LVM.

## 2024-06-06 NOTE — Telephone Encounter (Signed)
 Lauraine Lites, NP has reviewed CT results and has advised for pt to have a 3 month nodule follow up CT.

## 2024-06-07 ENCOUNTER — Encounter: Payer: Self-pay | Admitting: Internal Medicine

## 2024-06-07 ENCOUNTER — Ambulatory Visit: Admitting: Internal Medicine

## 2024-06-07 ENCOUNTER — Ambulatory Visit (HOSPITAL_COMMUNITY)
Admission: RE | Admit: 2024-06-07 | Discharge: 2024-06-07 | Disposition: A | Discharge status: Home / Self Care | Source: Ambulatory Visit | Attending: Adult Health | Admitting: Adult Health

## 2024-06-07 VITALS — BP 119/67 | HR 78 | Ht 63.0 in | Wt 154.0 lb

## 2024-06-07 DIAGNOSIS — I700 Atherosclerosis of aorta: Secondary | ICD-10-CM | POA: Diagnosis not present

## 2024-06-07 DIAGNOSIS — F17210 Nicotine dependence, cigarettes, uncomplicated: Secondary | ICD-10-CM | POA: Diagnosis not present

## 2024-06-07 DIAGNOSIS — J4489 Other specified chronic obstructive pulmonary disease: Secondary | ICD-10-CM

## 2024-06-07 DIAGNOSIS — R61 Generalized hyperhidrosis: Secondary | ICD-10-CM | POA: Diagnosis not present

## 2024-06-07 DIAGNOSIS — R6883 Chills (without fever): Secondary | ICD-10-CM | POA: Diagnosis not present

## 2024-06-07 DIAGNOSIS — R911 Solitary pulmonary nodule: Secondary | ICD-10-CM | POA: Diagnosis not present

## 2024-06-07 DIAGNOSIS — R232 Flushing: Secondary | ICD-10-CM | POA: Insufficient documentation

## 2024-06-07 NOTE — Patient Instructions (Signed)
 No change medication   The key is to stop smoking completely before smoking completely stops you!  Please schedule a follow up visit in 6 months but call sooner if needed

## 2024-06-07 NOTE — Assessment & Plan Note (Addendum)
 Active smoker - 12/01/2023  After extensive coaching inhaler device,  effectiveness =    75% (short ti) on hfa training > try breztri  pending pfts  - 12/01/2023   Walked on RA  x  2  lap(s) =  approx 300  ft  @ slow pace, stopped due to tired/legs hurt / sob  with lowest 02 sats 93%   - PFT's  02/28/24  FEV1 1.37 (59 % ) ratio 0.76  p 100 % improvement from saba p 0 prior to study with DLCO  5.33 (108%)   and FV curve classically cocave   - 02/29/2024 rx symbicort   80 2bid  LDSct  05/21/24  Centrilobular and paraseptal emphysema   .>>>  06/07/2024   Walked on RA  x  2  lap(s) =  approx 300  ft  @ mod pace, stopped due to leg pain and sob  with lowest 02 sats 93% but improved to 96% before stopping    No evidence of hypoxemia causing claudication ? Spinal claudication > defer further w/u the PCP/ cards.    >>> no change in symbicort  80 2bid

## 2024-06-07 NOTE — Assessment & Plan Note (Addendum)
 Active smoker - LDSct  05/21/24   9.2 LLL Ant segment  spn   CT results reviewed with pt >>> Too small for PET  not suspicious enough for excisional bx > really only option for now is follow the Fleischner society guidelines as rec by radiology and the LCS program and she has the phone number to arrange f/u.     Each maintenance medication was reviewed in detail including emphasizing most importantly the difference between maintenance and prns and under what circumstances the prns are to be triggered using an action plan format where appropriate.  Total time for H and P, chart review, counseling, reviewing hfa  device(s) , directly observing portions of ambulatory 02 saturation study/ and generating customized AVS unique to this office visit / same day charting = 35 min

## 2024-06-07 NOTE — Assessment & Plan Note (Addendum)
 Counseled re importance of smoking cessation but did not meet time criteria for separate billing

## 2024-06-07 NOTE — Progress Notes (Signed)
 [name], female    DOB: [birth year]    MRN: [id number]   Brief patient profile:  69   yowf  active smoker with passive smoke with tendency to bad sinus infections  referred to pulmonary clinic in [place]  12/01/2023 by Dr Spence  for doe onset  2024 worse x Aug 2024 ? While on macrodantin ?    History of Present Illness  12/01/2023  Pulmonary/ 1st office eval/ [name] / [place] [facility]  Chief Complaint  Patient presents with   Consult    Sob at rest and activity    Dyspnea:  mb and back  lived there x 20 years is much more difficult and stops half way back.  50 ft flat  while on macrodantin  per [name]  Cough  worse x 6 months > yellowish in ams Sleep:  sleeps on couch props up against arm of cough and pillow  SABA use: none  02: none  Rec Breztri  Take 2 puffs first thing in am and then another 2 puffs about 12 hours later.  Work on inhaler technique:   My office will be contacting you by phone for referral for PFTs and Lung cancer screening (low dose CT)  Stop nitrofurantoin   The key is to stop smoking completely before smoking completely stops you!      02/29/2024  f/u ov/[place] office/[name] re: GOLD 0/AB  maint on no rx  / still smoking  Chief Complaint  Patient presents with   Follow-up    Breathing has been worse because the allergies are worse- mold allergy and states her home is full of mold. She gets winded walking to the mailbox. She has some PND and cough- prod with clear sputum.  Dyspnea:  50 ft slt uphill back to house from mb  Cough: clear / worse in am  Sleeping: arm of couch  and one pillow some chest tightness      02: none  Lung cancer screening: refer  02/29/2024  Rec Symbicort  80 Take 2 puffs first thing in am and then another 2 puffs about 12 hours later.   Work on inhaler technique:   The key is to stop smoking completely before smoking completely stops you!   LDSct  05/21/24  Centrilobular and paraseptal emphysema / 9.2 LLL Ant segment  spn      06/07/2024  f/u ov/[place] office/[name] re: COPD GOLD 0 maint on symbiocort 80  f/u SPN on LDSCT  Chief Complaint  Patient presents with   Shortness of Breath    F/u    Dyspnea:  across the parking lot  x years s/p stents April 2025 Cough: smoker's rattle worse in am clears out p 5-10 min  Sleeping: arm of couch / pillow s    resp cc  SABA use: none 02: none     No obvious day to day or daytime variability or assoc excess/ purulent sputum or mucus plugs or hemoptysis or cp or chest tightness, subjective wheeze or overt sinus or hb symptoms.    Also denies any obvious fluctuation of symptoms with weather or environmental changes or other aggravating or alleviating factors except as outlined above   No unusual exposure hx or h/o childhood pna/ asthma or knowledge of premature birth.  Current Allergies, Complete Past Medical History, Past Surgical History, Family History, and Social History were reviewed in [name] record.  ROS  The following are not active complaints unless bolded Hoarseness, sore throat, dysphagia, dental problems, itching, sneezing,  nasal congestion or discharge of excess mucus or purulent secretions, ear ache,   fever, chills, sweats, unintended wt loss or wt gain, classically pleuritic or exertional cp,  orthopnea pnd or arm/hand swelling  or leg swelling, presyncope, palpitations, abdominal pain, anorexia, nausea, vomiting, diarrhea  or change in bowel habits or change in bladder habits, change in stools or change in urine, dysuria, hematuria,  rash, arthralgias, visual complaints, headache, numbness, weakness or ataxia or problems with walking or coordination,  change in mood or  memory. ? Claudication L > R leg         Current Meds  Medication Sig   aspirin  EC 81 MG tablet Take 1 tablet (81 mg total) by mouth daily. Swallow whole.   budesonide -formoterol  (SYMBICORT ) 80-4.5 MCG/ACT inhaler Take 2 puffs first thing in am and then  another 2 puffs about 12 hours later.   busPIRone  (BUSPAR ) 15 MG tablet Take 1 tablet (15 mg total) by mouth 3 (three) times daily.   Cholecalciferol (VITAMIN D3) 2000 units TABS Take 1 tablet by mouth daily.   clopidogrel  (PLAVIX ) 75 MG tablet Take 1 tablet (75 mg total) by mouth daily.   Continuous Glucose Receiver (FREESTYLE LIBRE 3 READER) DEVI Use as directed with sensor to monitor blood sugar continuously. Dx. E11.8   Continuous Glucose Sensor (FREESTYLE LIBRE 3 PLUS SENSOR) MISC USE TO MONITOR BLOOD SUGAR CONTINUOUSLY, CHANGE SENSOR EVERY 15 DAYS   FLUoxetine  (PROZAC ) 40 MG capsule Take 1 capsule by mouth once daily   glipiZIDE  (GLUCOTROL  XL) 10 MG 24 hr tablet Take 1 tablet by mouth once daily with breakfast   isosorbide  mononitrate (IMDUR ) 60 MG 24 hr tablet TAKE 1 & 1/2 (ONE & ONE-HALF) TABLETS BY MOUTH ONCE DAILY   LANTUS  SOLOSTAR 100 UNIT/ML Solostar Pen 10 units in the morning and 12 units in the evening   levETIRAcetam  (KEPPRA ) 250 MG tablet Take 1 tablet (250 mg total) by mouth 3 (three) times daily.   nitroGLYCERIN  (NITROSTAT ) 0.4 MG SL tablet Place 1 tablet (0.4 mg total) under the tongue every 5 (five) minutes x 3 doses as needed for chest pain.   nystatin  cream (MYCOSTATIN ) Apply 1 application topically 2 (two) times daily.   pravastatin  (PRAVACHOL ) 80 MG tablet Take 1 tablet (80 mg total) by mouth every evening.   pregabalin  (LYRICA ) 150 MG capsule Take 1 capsule by mouth twice daily           Past Medical History:  Diagnosis Date   Acute MI, inferior wall, initial episode of care [facility]) 07/08/2012   Cath-07/08/11 -distal RCA stent DES (99%), LAD 20-30%. EF 50% inferior hypokinesis  (infarct)   Anxiety    Asthma    Coronary artery disease    Depression    Diabetes mellitus    Fibromyalgia    Leukocytosis    Monocytosis    Peripheral arterial disease (HCC) 01/23/2013   Tobacco use disorder 07/08/2012      Objective:     Wts   06/07/2024       154   02/29/24  153 lb 9.6 oz (69.7 kg)  01/26/24 150 lb (68 kg)  12/14/23 151 lb (68.5 kg)   Vital signs reviewed  06/07/2024  - Note at rest 02 sats  96% on RA   General appearance:    amb hoarse wf/ smoker's rattle    HEENT : Oropharynx  clear     NECK :  without  apparent JVD/ palpable Nodes/TM    LUNGS: no  acc muscle use,  Min barrel  contour chest wall with bilateral  slightly decreased bs s audible wheeze and  without cough on insp or exp maneuvers and min  Hyperresonant  to  percussion bilaterally    CV:  RRR  no s3 or murmur or increase in P2, and no edema   ABD:  soft and nontender   MS:  Nl gait/ ext warm without deformities Or obvious joint restrictions  calf tenderness, cyanosis or clubbing     SKIN: warm and dry without lesions    NEURO:  alert, approp, nl sensorium with  no motor or cerebellar deficits apparent.                Assessment   Assessment & Plan Asthmatic bronchitis , chronic (HCC) Active smoker - 12/01/2023  After extensive coaching inhaler device,  effectiveness =    75% (short ti) on hfa training > try breztri  pending pfts  - 12/01/2023   Walked on RA  x  2  lap(s) =  approx 300  ft  @ slow pace, stopped due to tired/legs hurt / sob  with lowest 02 sats 93%   - PFT's  02/28/24  FEV1 1.37 (59 % ) ratio 0.76  p 100 % improvement from saba p 0 prior to study with DLCO  5.33 (108%)   and FV curve classically cocave   - 02/29/2024 rx symbicort   80 2bid  LDSct  05/21/24  Centrilobular and paraseptal emphysema   .>>>  06/07/2024   Walked on RA  x  2  lap(s) =  approx 300  ft  @ mod pace, stopped due to leg pain and sob  with lowest 02 sats 93% but improved to 96% before stopping    No evidence of hypoxemia causing claudication ? Spinal claudication > defer further w/u the PCP/ cards.    >>> no change in symbicort  80 2bid   Cigarette smoker Counseled re importance of smoking cessation but did not meet time criteria for separate billing    Solitary pulmonary nodule on  lung CT Active smoker - LDSct  05/21/24   9.2 LLL Ant segment  spn   CT results reviewed with pt >>> Too small for PET  not suspicious enough for excisional bx > really only option for now is follow the Fleischner society guidelines as rec by radiology and the LCS program and she has the phone number to arrange f/u.     Each maintenance medication was reviewed in detail including emphasizing most importantly the difference between maintenance and prns and under what circumstances the prns are to be triggered using an action plan format where appropriate.  Total time for H and P, chart review, counseling, reviewing hfa  device(s) , directly observing portions of ambulatory 02 saturation study/ and generating customized AVS unique to this office visit / same day charting = 35 min                 AVS  Patient Instructions  No change medication   The key is to stop smoking completely before smoking completely stops you!  Please schedule a follow up visit in 6 months but call sooner if needed       Ozell America, MD 06/07/2024

## 2024-06-11 NOTE — Telephone Encounter (Signed)
 Mailed Letter

## 2024-06-12 ENCOUNTER — Ambulatory Visit (HOSPITAL_COMMUNITY)
Admission: RE | Admit: 2024-06-12 | Discharge: 2024-06-12 | Disposition: A | Discharge status: Home / Self Care | Source: Ambulatory Visit | Attending: Cardiovascular Disease | Admitting: Cardiovascular Disease

## 2024-06-12 ENCOUNTER — Ambulatory Visit (HOSPITAL_BASED_OUTPATIENT_CLINIC_OR_DEPARTMENT_OTHER)
Admission: RE | Admit: 2024-06-12 | Discharge: 2024-06-12 | Disposition: A | Discharge status: Home / Self Care | Source: Ambulatory Visit | Attending: Cardiovascular Disease | Admitting: Cardiovascular Disease

## 2024-06-12 DIAGNOSIS — I739 Peripheral vascular disease, unspecified: Secondary | ICD-10-CM

## 2024-06-12 DIAGNOSIS — Z95820 Peripheral vascular angioplasty status with implants and grafts: Secondary | ICD-10-CM | POA: Insufficient documentation

## 2024-06-13 ENCOUNTER — Other Ambulatory Visit: Payer: Self-pay | Admitting: *Deleted

## 2024-06-13 DIAGNOSIS — Z87891 Personal history of nicotine dependence: Secondary | ICD-10-CM

## 2024-06-13 DIAGNOSIS — R911 Solitary pulmonary nodule: Secondary | ICD-10-CM

## 2024-06-13 LAB — VAS US ABI WITH/WO TBI
Left ABI: 0.61
Right ABI: 0.85

## 2024-06-13 NOTE — Telephone Encounter (Signed)
 Results discussed with patient at ov with Dr [name] 06/07/24. 3 month nodule follow up CT ordered.

## 2024-06-14 ENCOUNTER — Other Ambulatory Visit: Payer: Self-pay | Admitting: Cardiovascular Disease

## 2024-06-14 ENCOUNTER — Ambulatory Visit: Payer: Self-pay | Admitting: Cardiovascular Disease

## 2024-06-14 ENCOUNTER — Ambulatory Visit

## 2024-06-14 ENCOUNTER — Other Ambulatory Visit: Payer: Self-pay | Admitting: Adult Health

## 2024-06-14 DIAGNOSIS — R569 Unspecified convulsions: Secondary | ICD-10-CM

## 2024-06-14 DIAGNOSIS — I739 Peripheral vascular disease, unspecified: Secondary | ICD-10-CM

## 2024-06-14 DIAGNOSIS — M5412 Radiculopathy, cervical region: Secondary | ICD-10-CM

## 2024-06-14 NOTE — Telephone Encounter (Signed)
 Okay for refill?

## 2024-06-28 ENCOUNTER — Other Ambulatory Visit: Payer: Self-pay | Admitting: Adult Health

## 2024-07-10 ENCOUNTER — Encounter: Payer: Self-pay | Admitting: Acute Care

## 2024-07-11 NOTE — Progress Notes (Signed)
 [name]                                          MRN: [id number]   07/11/2024   The [facility] Quality Team Specialist reviewed this patient medical record for the purposes of chart review for care gap closure. The following were reviewed: chart review for care gap closure-breast cancer screening and colorectal cancer screening.    [facility] Quality Team

## 2024-07-16 ENCOUNTER — Other Ambulatory Visit: Payer: Self-pay | Admitting: Adult Health

## 2024-07-16 DIAGNOSIS — I2119 ST elevation (STEMI) myocardial infarction involving other coronary artery of inferior wall: Secondary | ICD-10-CM

## 2024-08-02 ENCOUNTER — Encounter: Payer: Self-pay | Admitting: Endocrinology

## 2024-08-02 ENCOUNTER — Ambulatory Visit: Admitting: Endocrinology

## 2024-08-02 ENCOUNTER — Ambulatory Visit: Payer: Self-pay | Admitting: Endocrinology

## 2024-08-02 ENCOUNTER — Other Ambulatory Visit

## 2024-08-02 VITALS — BP 128/70 | HR 74 | Resp 16 | Ht 63.0 in | Wt 157.6 lb

## 2024-08-02 DIAGNOSIS — E1165 Type 2 diabetes mellitus with hyperglycemia: Secondary | ICD-10-CM

## 2024-08-02 DIAGNOSIS — N1832 Chronic kidney disease, stage 3b: Secondary | ICD-10-CM

## 2024-08-02 DIAGNOSIS — Z794 Long term (current) use of insulin: Secondary | ICD-10-CM | POA: Diagnosis not present

## 2024-08-02 LAB — POCT GLYCOSYLATED HEMOGLOBIN (HGB A1C): Hemoglobin A1C: 7.2 % — AB (ref 4.0–5.6)

## 2024-08-02 MED ORDER — FREESTYLE LIBRE 3 PLUS SENSOR MISC: 3 refills | Status: DC

## 2024-08-02 MED ORDER — INSULIN PEN NEEDLE 32G X 4 MM MISC: 3 refills | Status: AC

## 2024-08-02 MED ORDER — INSULIN LISPRO (1 UNIT DIAL) 100 UNIT/ML (KWIKPEN): 0.0000 [IU] | PEN_INJECTOR | Freq: Three times a day (TID) | SUBCUTANEOUS | 4 refills | Status: DC

## 2024-08-02 MED ORDER — LANTUS SOLOSTAR 100 UNIT/ML ~~LOC~~ SOPN: 20.0000 [IU] | PEN_INJECTOR | Freq: Every day | SUBCUTANEOUS | 5 refills | Status: AC

## 2024-08-02 NOTE — Patient Instructions (Signed)
 Diabetes regimen  Decrease Lantus  to 20 units daily, once a day only.  Start Humalog sliding scale as follows, to take before eating up to 3 times a day.  Sliding Scale Blood Glucose        Insulin  60-150                     None 151-200                   None 201-250                   1 units 251-300                   2 units 301-350                   3 units 351-400                   4 units      >400                        5 units and call provider   Continue glipizide  XL for now.

## 2024-08-02 NOTE — Progress Notes (Signed)
 Outpatient Endocrinology Note Sydney Coats, MD   Patient's Name: [name]    DOB: 1956/09/26    MRN: [id number]                                                    REASON OF VISIT: New consult for type 2 diabetes mellitus  REFERRING PROVIDER: Merna Huxley, NP  PCP: Sydney Huxley, NP  HISTORY OF PRESENT ILLNESS:   [name] is a 69 y.o. old female with past medical history listed below, is here for new consult for type 2 diabetes mellitus.   Pertinent Diabetes History: Patient is referred to endocrinology for evaluation and management of uncontrolled type 2 diabetes mellitus.  Initial consult on August 02, 2024.  Patient was diagnosed with type 2 diabetes mellitus 30+ years ago.  Patient has suboptimal control of type 2 diabetes mellitus mostly hemoglobin A1c in the range of 7 to 8% range.  History of DKA or diabetes related hospitalizations: 1-2 times. ?  In 2019 at [facility].  Previous diabetes education: ? Yes   Family h/o diabetes mellitus: two sons with type 1 diabetes.  Multiple family members with type 2 diabetes mellitus.    No personal history of pancreatitis and / or family history of medullary thyroid  carcinoma or MEN 2B syndrome.   Chronic Diabetes Complications : Retinopathy: no. Last ophthalmology exam was done on Due, following with ophthalmology regularly.  Nephropathy: CKD IIIb Peripheral neuropathy: no.  She is on Lyrica  for fibromyalgia.   Coronary artery disease: CAD s/p stents Stroke: no  Relevant comorbidities and cardiovascular risk factors: Obesity: no Body mass index is 27.92 kg/m.  Hypertension: Yes  Hyperlipidemia : Yes, on statin   Current / Home Diabetic regimen includes:  Lantus  10 units in the morning and 14 units at bedtime that at bedtime. Glipizide  extended release 10 mg daily.    Prior diabetic medications: Metformin  in the past.  Patient reports it was discontinued after she had DKA in the past.  Glycemic data:     CONTINUOUS GLUCOSE MONITORING SYSTEM (CGMS) INTERPRETATION: At today's visit, we reviewed CGM downloads. The full report is scanned in the media. Reviewing the CGM trends, blood glucose are as follows:  FreeStyle Libre 3+ CGM-  Sensor Download (Sensor download was reviewed and summarized below.) Dates: October 23 to August 01, 2024, 14 days    Interpretation: - Mostly acceptable blood sugar with random mild hypoglycemia with blood sugar in 60s in the early morning, in between the meals and late evening.  Hyperglycemia with blood sugar in the 300 range usually around midnight related to meals otherwise no concerning hyperglycemia.  Hypoglycemia: Patient has minor hypoglycemic episodes. Patient has hypoglycemia awareness.  Factors modifying glucose control: 1.  Diabetic diet assessment: 1-3 minutes a day, no empiric meal, may snack frequently.  2.  Staying active or exercising:   3.  Medication compliance: compliant all of the time.  Interval history  Initial visit today.  Patient presented for evaluation and management of type 2 diabetes mellitus.  She denies numbness and ting of the feet.  She is a current smoker, discussed about quitting smoking.  Diabetes is been as reviewed and noted above.  CGM data as reviewed and noted above.  Patient is accompanied by her husband in the clinic today.  Hemoglobin A1c today 7.2%.  REVIEW OF SYSTEMS As per history of present illness.   PAST MEDICAL HISTORY: Past Medical History:  Diagnosis Date   Acute MI, inferior wall, initial episode of care (HCC) 07/08/2012   Cath-07/08/11 -distal RCA stent DES (99%), LAD 20-30%. EF 50% inferior hypokinesis  (infarct)   Anxiety    Asthma    Coronary artery disease    Depression    Diabetes mellitus    Fibromyalgia    Leukocytosis    Monocytosis    Peripheral arterial disease 01/23/2013   Tobacco use disorder 07/08/2012    PAST SURGICAL HISTORY: Past Surgical History:  Procedure Laterality  Date   ABDOMINAL AORTAGRAM N/A 02/07/2013   Procedure: ABDOMINAL EZELLA;  Surgeon: Deatrice DELENA Cage, MD;  Location: [facility] CATH LAB;  Service: Cardiovascular;  Laterality: N/A;   ABDOMINAL AORTOGRAM W/LOWER EXTREMITY N/A 11/09/2023   Procedure: ABDOMINAL AORTOGRAM W/LOWER EXTREMITY;  Surgeon: Cage Deatrice DELENA, MD;  Location: [facility] INVASIVE CV LAB;  Service: Cardiovascular;  Laterality: N/A;   CARDIAC CATHETERIZATION     CORONARY STENT PLACEMENT     CYST REMOVAL TRUNK  1977   tailbone   LEFT HEART CATHETERIZATION WITH CORONARY ANGIOGRAM N/A 07/07/2012   Procedure: LEFT HEART CATHETERIZATION WITH CORONARY ANGIOGRAM;  Surgeon: Lonni JONETTA Cash, MD;  Location: [facility] CATH LAB;  Service: Cardiovascular;  Laterality: N/A;   PERCUTANEOUS CORONARY STENT INTERVENTION (PCI-S)  07/07/2012   Procedure: PERCUTANEOUS CORONARY STENT INTERVENTION (PCI-S);  Surgeon: Lonni JONETTA Cash, MD;  Location: [facility] CATH LAB;  Service: Cardiovascular;;   PERIPHERAL VASCULAR BALLOON ANGIOPLASTY Right 11/09/2023   Procedure: PERIPHERAL VASCULAR BALLOON ANGIOPLASTY;  Surgeon: Cage Deatrice DELENA, MD;  Location: [facility] INVASIVE CV LAB;  Service: Cardiovascular;  Laterality: Right;  Ext Iliac   PERIPHERAL VASCULAR INTERVENTION Right 11/09/2023   Procedure: PERIPHERAL VASCULAR INTERVENTION;  Surgeon: Cage Deatrice DELENA, MD;  Location: [facility] INVASIVE CV LAB;  Service: Cardiovascular;  Laterality: Right;  Ext Iliac   TONSILLECTOMY      ALLERGIES: Allergies  Allergen Reactions   Erythromycin Other (See Comments)   Statins     SEVERE MUSCLE PAIN - failed lipitor , Livalo , red yeast rice (lovastatin), and she thinks Zocor and pravastatin    Ace Inhibitors Cough    Patient refuses to take meds   Cymbalta [Duloxetine Hcl]     Drowsiness   Doxycycline Other (See Comments)    diarrhea   Jardiance [Empagliflozin]     Yeast infections     Fentanyl  Anxiety    FAMILY HISTORY:  Family History  Problem Relation Age of Onset   Kidney  disease Mother    Diabetes Mother        DM Type II   Alzheimer's disease Mother    Diabetes Father        IDDM   Heart disease Father    Cancer Father        bone cancer died at 57   Cancer Sister        bone cancer at age 86   Multiple sclerosis Daughter    Diabetes Son 2       DM Type I   Heart disease Maternal Grandfather    Heart disease Paternal Grandmother    Diabetes Son 58       DM Type I    SOCIAL HISTORY: Social History   Socioeconomic History   Marital status: Married    Spouse name: Not on file   Number of children: 3   Years of education: college   Highest  education level: Bachelor's degree (e.g., BA, AB, BS)  Occupational History   Not on file  Tobacco Use   Smoking status: Every Day    Current packs/day: 1.00    Average packs/day: 1 pack/day for 43.0 years (43.0 ttl pk-yrs)    Types: Cigarettes   Smokeless tobacco: Never   Tobacco comments:    Currently 1/2 ppd 02/29/24  Substance and Sexual Activity   Alcohol use: No    Alcohol/week: 0.0 standard drinks of alcohol    Comment: Rarely   Drug use: No   Sexual activity: Yes  Other Topics Concern   Not on file  Social History Narrative   Lives with husband, married for 20 years.      They have three grown chlildren.   She is not currently working and is on disability   Highest level of education:  Engineer, maintenance (it)   Pets: two dogs and three rabbits.       Husband is a long production assistant, radio.       Is unable to enjoy any activities because of body pains.       Social Drivers of Health   Financial Resource Strain: Low Risk  (04/13/2023)   Overall Financial Resource Strain (CARDIA)    Difficulty of Paying Living Expenses: Not hard at all  Food Insecurity: Patient Declined (05/13/2023)   Hunger Vital Sign    Worried About Running Out of Food in the Last Year: Patient declined    Ran Out of Food in the Last Year: Patient declined  Transportation Needs: Unmet Transportation Needs (05/13/2023)    PRAPARE - Administrator, Civil Service (Medical): Yes    Lack of Transportation (Non-Medical): Yes  Physical Activity: Unknown (05/13/2023)   Exercise Vital Sign    Days of Exercise per Week: Patient declined    Minutes of Exercise per Session: 0 min  Recent Concern: Physical Activity - Inactive (04/13/2023)   Exercise Vital Sign    Days of Exercise per Week: 0 days    Minutes of Exercise per Session: 0 min  Stress: Stress Concern Present (05/13/2023)   [employer] of Occupational Health - Occupational Stress Questionnaire    Feeling of Stress : Very much  Social Connections: Moderately Isolated (05/13/2023)   Social Connection and Isolation Panel    Frequency of Communication with Friends and Family: Once a week    Frequency of Social Gatherings with Friends and Family: Never    Attends Religious Services: 1 to 4 times per year    Active Member of [employer] or Organizations: No    Attends Banker Meetings: Never    Marital Status: Married    MEDICATIONS:  Current Outpatient Medications  Medication Sig Dispense Refill   aspirin  EC 81 MG tablet Take 1 tablet (81 mg total) by mouth daily. Swallow whole.     budesonide -formoterol  (SYMBICORT ) 80-4.5 MCG/ACT inhaler Take 2 puffs first thing in am and then another 2 puffs about 12 hours later. 1 each 12   busPIRone  (BUSPAR ) 15 MG tablet TAKE 1 TABLET BY MOUTH THREE TIMES DAILY 270 tablet 0   Cholecalciferol (VITAMIN D3) 2000 units TABS Take 1 tablet by mouth daily.     clopidogrel  (PLAVIX ) 75 MG tablet Take 1 tablet by mouth once daily 30 tablet 5   Continuous Glucose Receiver (FREESTYLE LIBRE 3 READER) DEVI Use as directed with sensor to monitor blood sugar continuously. Dx. E11.8 1 each 0   glipiZIDE  (GLUCOTROL  XL) 10 MG  24 hr tablet Take 1 tablet by mouth once daily with breakfast 90 tablet 0   insulin  lispro (HUMALOG KWIKPEN) 100 UNIT/ML KwikPen Inject 0-6 Units into the skin 3 (three) times daily. Maximum 20  units/day as per sliding scale provided. 15 mL 4   Insulin  Pen Needle 32G X 4 MM MISC Use 4x a day 300 each 3   isosorbide  mononitrate (IMDUR ) 60 MG 24 hr tablet TAKE 1 & 1/2 (ONE & ONE-HALF) TABLETS BY MOUTH ONCE DAILY 135 tablet 0   levETIRAcetam  (KEPPRA ) 250 MG tablet TAKE 1 TABLET BY MOUTH THREE TIMES DAILY 270 tablet 0   nitroGLYCERIN  (NITROSTAT ) 0.4 MG SL tablet Place 1 tablet (0.4 mg total) under the tongue every 5 (five) minutes x 3 doses as needed for chest pain. 25 tablet 3   nystatin  cream (MYCOSTATIN ) Apply 1 application topically 2 (two) times daily. 30 g 2   pravastatin  (PRAVACHOL ) 80 MG tablet Take 1 tablet (80 mg total) by mouth every evening. 90 tablet 3   pregabalin  (LYRICA ) 150 MG capsule Take 1 capsule by mouth twice daily 60 capsule 2   Continuous Glucose Sensor (FREESTYLE LIBRE 3 PLUS SENSOR) MISC USE TO MONITOR BLOOD SUGAR CONTINOUSLY, CHANGE SENSOR EVERY 15 DAYS 6 each 3   LANTUS  SOLOSTAR 100 UNIT/ML Solostar Pen Inject 20 Units into the skin daily. 15 mL 5   No current facility-administered medications for this visit.    PHYSICAL EXAM: Vitals:   08/02/24 1500  BP: 128/70  Pulse: 74  Resp: 16  SpO2: 97%  Weight: 157 lb 9.6 oz (71.5 kg)  Height: 5' 3 (1.6 m)   Body mass index is 27.92 kg/m.  Wt Readings from Last 3 Encounters:  08/02/24 157 lb 9.6 oz (71.5 kg)  06/07/24 154 lb (69.9 kg)  03/15/24 155 lb (70.3 kg)    General: Well developed, well nourished female in no apparent distress.  HEENT: AT/[place], no external lesions.  Eyes: Conjunctiva clear and no icterus. Neck: Neck supple  Lungs: Respirations not labored Neurologic: Alert, oriented, normal speech Extremities / Skin: Dry.  Psychiatric: Does not appear depressed or anxious  Diabetic Foot Exam - Simple   Simple Foot Form Diabetic Foot exam was performed with the following findings: Yes 08/02/2024  3:41 PM  Visual Inspection No deformities, no ulcerations, no other skin breakdown bilaterally:  Yes Sensation Testing Intact to touch and monofilament testing bilaterally: Yes Pulse Check Posterior Tibialis and Dorsalis pulse intact bilaterally: Yes Comments    LABS Reviewed Lab Results  Component Value Date   HGBA1C 7.2 (A) 08/02/2024   HGBA1C 7.5 (A) 03/15/2024   HGBA1C 7.3 (A) 12/14/2023   No results found for: FRUCTOSAMINE Lab Results  Component Value Date   CHOL 226 (H) 10/18/2023   HDL 50 10/18/2023   LDLCALC 134 (H) 10/18/2023   LDLDIRECT 72.0 02/24/2022   TRIG 233 (H) 10/18/2023   CHOLHDL 4.5 (H) 10/18/2023   Lab Results  Component Value Date   MICRALBCREAT Unable to calculate 01/27/2024   Lab Results  Component Value Date   CREATININE 1.41 (H) 01/27/2024   Lab Results  Component Value Date   GFR 39.03 (L) 01/27/2024    ASSESSMENT / PLAN  1. Uncontrolled type 2 diabetes mellitus with hyperglycemia, with long-term current use of insulin  (HCC)   2. Current use of insulin  (HCC)   3. Stage 3b chronic kidney disease (HCC)     Diabetes Mellitus type 2, complicated by CAD/CKD. - Diabetic status / severity: Uncontrolled.  Lab Results  Component Value Date   HGBA1C 7.2 (A) 08/02/2024    - Hemoglobin A1c goal : <6.5% Discussed about type 2 diabetes mellitus and potential chronic diabetic complications including diabetic retinopathy, neuropathy and nephropathy.  She has reasonable control of type 2 diabetes mellitus however can be better.  She has occasional hypoglycemia in between the meals we will decrease her basal insulin  as follows and restart sliding scale.  She has 2 sons with type 1 diabetes mellitus.  She has variability of blood sugar and also had history of diabetic ketoacidosis.  I would like to check autoantibodies for type 1 diabetes mellitus.  - Medications: See below.  I) decrease Lantus  to 20 units daily.  She has been taking Lantus  10 units in the morning and 14 units at bedtime. II) start Humalog sliding scale to take before  eating up to 3 times a day.  Sliding Scale Blood Glucose        Insulin  60-150                     None 151-200                   None 201-250                   1 units 251-300                   2 units 301-350                   3 units 351-400                   4 units      >400                        5 units and call provider   III) continue glipizide  extended release 10 mg daily.  - Home glucose testing: Freestyle libre 3+ CGM and check as needed. - Discussed/ Gave Hypoglycemia treatment plan.  # Consult : not required at this time.   # Annual urine for microalbuminuria/ creatinine ratio, no microalbuminuria currently, she has CKD 3b, referred to nephrology. Last  Lab Results  Component Value Date   MICRALBCREAT Unable to calculate 01/27/2024    # Foot check nightly.  # Annual dilated diabetic eye exams.  Advised to have diabetic eye exam.  - Diet: Eat reasonable portion sizes to promote a healthy weight, discussed in detail, advised to avoid high carb meal and frequent snacking. - Life style / activity / exercise: Discussed.  2. Blood pressure  -  BP Readings from Last 1 Encounters:  08/02/24 128/70    - Control is in target.  - No change in current plans.  3. Lipid status / Hyperlipidemia - Last  Lab Results  Component Value Date   LDLCALC 134 (H) 10/18/2023   - Continue pravastatin  80 mg daily.  Not at goal.  Managed by PCP at this time.  Diagnoses and all orders for this visit:  Uncontrolled type 2 diabetes mellitus with hyperglycemia, with long-term current use of insulin  (HCC) -     POCT glycosylated hemoglobin (Hb A1C) -     C-peptide -     IA-2 Antibody -     Glutamic acid decarboxylase auto abs -     ZNT8 Antibodies -     Basic metabolic panel with GFR -  insulin  lispro (HUMALOG KWIKPEN) 100 UNIT/ML KwikPen; Inject 0-6 Units into the skin 3 (three) times daily. Maximum 20 units/day as per sliding scale provided. -     Insulin  Pen Needle 32G X  4 MM MISC; Use 4x a day -     Continuous Glucose Sensor (FREESTYLE LIBRE 3 PLUS SENSOR) MISC; USE TO MONITOR BLOOD SUGAR CONTINOUSLY, CHANGE SENSOR EVERY 15 DAYS  Current use of insulin  (HCC) -     LANTUS  SOLOSTAR 100 UNIT/ML Solostar Pen; Inject 20 Units into the skin daily.  Stage 3b chronic kidney disease (HCC) -     Ambulatory referral to Nephrology    DISPOSITION Follow up in clinic in 3 months suggested.  Labs today as ordered.   All questions answered and patient verbalized understanding of the plan.  Leavy Heatherly, MD [facility] Endocrinology [facility] [address] [place], [address] [place], [place] [address] Phone # [phone]  At least part of this note was generated using voice recognition software. Inadvertent word errors may have occurred, which were not recognized during the proofreading process.

## 2024-08-08 LAB — BASIC METABOLIC PANEL WITH GFR
BUN/Creatinine Ratio: 16 (calc) (ref 6–22)
BUN: 22 mg/dL (ref 7–25)
CO2: 23 mmol/L (ref 20–32)
Calcium: 10.1 mg/dL (ref 8.6–10.4)
Chloride: 106 mmol/L (ref 98–110)
Creat: 1.35 mg/dL — ABNORMAL HIGH (ref 0.50–1.05)
Glucose, Bld: 108 mg/dL — ABNORMAL HIGH (ref 65–99)
Potassium: 4.4 mmol/L (ref 3.5–5.3)
Sodium: 140 mmol/L (ref 135–146)
eGFR: 43 mL/min/1.73m2 — ABNORMAL LOW (ref >=60)

## 2024-08-08 LAB — IA-2 ANTIBODY: IA-2 Antibody: <5.4 U/mL (ref <5.4)

## 2024-08-08 LAB — C-PEPTIDE: C-Peptide: 3.56 ng/mL (ref 0.80–3.85)

## 2024-08-08 LAB — ZNT8 ANTIBODIES: ZNT8 Antibodies: <10 U/mL (ref <15)

## 2024-08-08 LAB — GLUTAMIC ACID DECARBOXYLASE AUTO ABS: Glutamic Acid Decarb Ab: <5 [IU]/mL (ref <5)

## 2024-08-13 NOTE — Progress Notes (Unsigned)
 Cardiology Office Note   Date:  08/14/2024   ID:  [name], DOB [birth year], MRN [id number]  PCP:  Merna Huxley, NP  Cardiologist:   Deatrice Cage, MD   No chief complaint on file.     History of Present Illness: [name] is a 68 y.o. female who is here today for a follow-up visit regarding peripheral arterial disease.   She has known history of coronary artery disease status post stent to the PDA in 2013, type 2 diabetes, tobacco use and hyperlipidemia. She was seen in early 2025 for worsening bilateral calf claudication especially on the right side.   She underwent noninvasive vascular studies in December which showed an ABI of 0.64 on the right and 0.86 on the left. I proceeded with angiography in February 2025 which showed mild to moderate stenosis in the abdominal aorta below the renal arteries with small aneurysmal segment above the iliac bifurcation.  Right lower extremity showed moderate common iliac artery stenosis with occlusion of the whole length of the external iliac artery and mild SFA disease.  On the left side, there was moderate diffuse common iliac and external iliac artery disease with moderate diffuse disease in the proximal and mid segment of the SFA.  I performed successful angioplasty and drug-eluting stent placement to the right external iliac artery.  She had a postprocedure hematoma from the left groin in the setting of severely elevated blood pressure.  This improved, did not require any intervention and she was discharged same day. She only had mild improvement in right leg pain after the procedure.  Most recent Doppler studies in September showed an ABI of 0.85 on the right and 0.61 on the left.  Duplex showed patent right external iliac artery stent with significant proximal stenosis with peak velocity of 366 which extended into the distal common iliac artery.  On the left side, there was severe progression of left external iliac artery  stenosis with peak velocity of 488.  She reports bilateral leg claudication which is equal in both sides.  Symptoms start with minimal walking in the thigh area and goes all the way down to her calf and feet.  No rest pain or lower extremity ulceration.  She cut down on tobacco use but has not been able to quit completely.  Past Medical History:  Diagnosis Date   Acute MI, inferior wall, initial episode of care (HCC) 07/08/2012   Cath-07/08/11 -distal RCA stent DES (99%), LAD 20-30%. EF 50% inferior hypokinesis  (infarct)   Anxiety    Asthma    Coronary artery disease    Depression    Diabetes mellitus    Fibromyalgia    Leukocytosis    Monocytosis    Peripheral arterial disease 01/23/2013   Tobacco use disorder 07/08/2012    Past Surgical History:  Procedure Laterality Date   ABDOMINAL AORTAGRAM N/A 02/07/2013   Procedure: ABDOMINAL EZELLA;  Surgeon: Deatrice DELENA Cage, MD;  Location: [facility] CATH LAB;  Service: Cardiovascular;  Laterality: N/A;   ABDOMINAL AORTOGRAM W/LOWER EXTREMITY N/A 11/09/2023   Procedure: ABDOMINAL AORTOGRAM W/LOWER EXTREMITY;  Surgeon: Cage Deatrice DELENA, MD;  Location: [facility] INVASIVE CV LAB;  Service: Cardiovascular;  Laterality: N/A;   CARDIAC CATHETERIZATION     CORONARY STENT PLACEMENT     CYST REMOVAL TRUNK  1977   tailbone   LEFT HEART CATHETERIZATION WITH CORONARY ANGIOGRAM N/A 07/07/2012   Procedure: LEFT HEART CATHETERIZATION WITH CORONARY ANGIOGRAM;  Surgeon: Lonni JONETTA Cash, MD;  Location: [facility]  CATH LAB;  Service: Cardiovascular;  Laterality: N/A;   PERCUTANEOUS CORONARY STENT INTERVENTION (PCI-S)  07/07/2012   Procedure: PERCUTANEOUS CORONARY STENT INTERVENTION (PCI-S);  Surgeon: Lonni JONETTA Cash, MD;  Location: [facility] CATH LAB;  Service: Cardiovascular;;   PERIPHERAL VASCULAR BALLOON ANGIOPLASTY Right 11/09/2023   Procedure: PERIPHERAL VASCULAR BALLOON ANGIOPLASTY;  Surgeon: Darron Deatrice LABOR, MD;  Location: [facility] INVASIVE CV LAB;  Service:  Cardiovascular;  Laterality: Right;  Ext Iliac   PERIPHERAL VASCULAR INTERVENTION Right 11/09/2023   Procedure: PERIPHERAL VASCULAR INTERVENTION;  Surgeon: Darron Deatrice LABOR, MD;  Location: [facility] INVASIVE CV LAB;  Service: Cardiovascular;  Laterality: Right;  Ext Iliac   TONSILLECTOMY       Current Outpatient Medications  Medication Sig Dispense Refill   aspirin  EC 81 MG tablet Take 1 tablet (81 mg total) by mouth daily. Swallow whole.     budesonide -formoterol  (SYMBICORT ) 80-4.5 MCG/ACT inhaler Take 2 puffs first thing in am and then another 2 puffs about 12 hours later. 1 each 12   busPIRone  (BUSPAR ) 15 MG tablet TAKE 1 TABLET BY MOUTH THREE TIMES DAILY 270 tablet 0   Cholecalciferol (VITAMIN D3) 2000 units TABS Take 1 tablet by mouth daily.     clopidogrel  (PLAVIX ) 75 MG tablet Take 1 tablet by mouth once daily 30 tablet 5   Continuous Glucose Receiver (FREESTYLE LIBRE 3 READER) DEVI Use as directed with sensor to monitor blood sugar continuously. Dx. E11.8 1 each 0   Continuous Glucose Sensor (FREESTYLE LIBRE 3 PLUS SENSOR) MISC USE TO MONITOR BLOOD SUGAR CONTINOUSLY, CHANGE SENSOR EVERY 15 DAYS 6 each 3   glipiZIDE  (GLUCOTROL  XL) 10 MG 24 hr tablet Take 1 tablet by mouth once daily with breakfast 90 tablet 0   insulin  lispro (HUMALOG KWIKPEN) 100 UNIT/ML KwikPen Inject 0-6 Units into the skin 3 (three) times daily. Maximum 20 units/day as per sliding scale provided. 15 mL 4   Insulin  Pen Needle 32G X 4 MM MISC Use 4x a day 300 each 3   isosorbide  mononitrate (IMDUR ) 60 MG 24 hr tablet TAKE 1 & 1/2 (ONE & ONE-HALF) TABLETS BY MOUTH ONCE DAILY 135 tablet 0   LANTUS  SOLOSTAR 100 UNIT/ML Solostar Pen Inject 20 Units into the skin daily. 15 mL 5   levETIRAcetam  (KEPPRA ) 250 MG tablet TAKE 1 TABLET BY MOUTH THREE TIMES DAILY 270 tablet 0   nitroGLYCERIN  (NITROSTAT ) 0.4 MG SL tablet Place 1 tablet (0.4 mg total) under the tongue every 5 (five) minutes x 3 doses as needed for chest pain. 25 tablet 3    nystatin  cream (MYCOSTATIN ) Apply 1 application topically 2 (two) times daily. 30 g 2   pravastatin  (PRAVACHOL ) 80 MG tablet Take 1 tablet (80 mg total) by mouth every evening. 90 tablet 3   pregabalin  (LYRICA ) 150 MG capsule Take 1 capsule by mouth twice daily 60 capsule 2   No current facility-administered medications for this visit.    Allergies:   Erythromycin, Statins, Ace inhibitors, Cymbalta [duloxetine hcl], Doxycycline, Jardiance [empagliflozin], and Fentanyl     Social History:  The patient  reports that she has been smoking cigarettes. She has a 43 pack-year smoking history. She has never used smokeless tobacco. She reports that she does not drink alcohol and does not use drugs.   Family History:  The patient's family history includes Alzheimer's disease in her mother; Cancer in her father and sister; Diabetes in her father and mother; Diabetes (age of onset: 47) in her son; Diabetes (age of onset: 2) in  her son; Heart disease in her father, maternal grandfather, and paternal grandmother; Kidney disease in her mother; Multiple sclerosis in her daughter.    ROS:  Please see the history of present illness.   Otherwise, review of systems are positive for none.   All other systems are reviewed and negative.    PHYSICAL EXAM: VS:  BP (!) 118/48 (BP Location: Right Arm, Patient Position: Sitting, Cuff Size: Normal)   Pulse 70   Ht 5' 3.5 (1.613 m)   Wt 159 lb (72.1 kg)   SpO2 93%   BMI 27.72 kg/m  , BMI Body mass index is 27.72 kg/m. GEN: Well nourished, well developed, in no acute distress  HEENT: normal  Neck: no JVD, carotid bruits, or masses Cardiac: RRR; no murmurs, rubs, or gallops,no edema  Respiratory:  clear to auscultation bilaterally, normal work of breathing GI: soft, nontender, nondistended, + BS MS: no deformity or atrophy  Skin: warm and dry, no rash Neuro:  Strength and sensation are intact Psych: euthymic mood, full affect Vascular: Femoral pulses +1  bilaterally.  Distal pulses are not palpable.  No groin hematoma but she does have left thigh bruising that seems to be improving.   EKG:  EKG is ordered today. EKG showed: Normal sinus rhythm Right bundle branch block T wave abnormality, consider inferior ischemia When compared with ECG of 31-Aug-2023 15:53, No significant change was found    Recent Labs: 01/27/2024: ALT 12; Hemoglobin 15.0; Platelets 260.0; TSH 0.85 08/02/2024: BUN 22; Creat 1.35; Potassium 4.4; Sodium 140    Lipid Panel    Component Value Date/Time   CHOL 226 (H) 10/18/2023 1211   TRIG 233 (H) 10/18/2023 1211   HDL 50 10/18/2023 1211   CHOLHDL 4.5 (H) 10/18/2023 1211   CHOLHDL 3 02/24/2022 1508   VLDL 53.8 (H) 02/24/2022 1508   LDLCALC 134 (H) 10/18/2023 1211   LDLDIRECT 72.0 02/24/2022 1508      Wt Readings from Last 3 Encounters:  08/14/24 159 lb (72.1 kg)  08/02/24 157 lb 9.6 oz (71.5 kg)  06/07/24 154 lb (69.9 kg)           No data to display            ASSESSMENT AND PLAN:  1.  Peripheral arterial disease with severe claudication: Status post recent drug-eluting stent placement to the right external iliac artery for a total occlusion.  Continue dual antiplatelet therapy for now.  She does have progression of bilateral iliac disease worse on the left side with severe claudication.  I discussed with her management options including referral to a supervised exercise therapy program but she is not able to attend due to distance.  I discussed with her the option of endovascular intervention but she wants to wait at this time.  2.  Coronary artery disease involving native coronary arteries without angina: No chest pain.  Continue medical therapy.  3.  Tobacco use: I again discussed the importance of smoking cessation.  4.  Essential hypertension: Blood pressure is controlled on current medications.  5.  Hyperlipidemia: Currently on pravastatin .  She is intolerant to statins due to myalgia and  seems to be having more symptoms after the dose was increased to 80 mg daily.  We should consider a PCSK9 inhibitor.    Disposition: Follow-up in 6 months.  Signed,  Deatrice Cage, MD  08/14/2024 2:47 PM    [facility]

## 2024-08-14 ENCOUNTER — Encounter: Payer: Self-pay | Admitting: Cardiovascular Disease

## 2024-08-14 ENCOUNTER — Ambulatory Visit: Attending: Cardiovascular Disease | Admitting: Cardiovascular Disease

## 2024-08-14 VITALS — BP 118/48 | HR 70 | Ht 63.5 in | Wt 159.0 lb

## 2024-08-14 DIAGNOSIS — E785 Hyperlipidemia, unspecified: Secondary | ICD-10-CM | POA: Diagnosis not present

## 2024-08-14 DIAGNOSIS — I739 Peripheral vascular disease, unspecified: Secondary | ICD-10-CM

## 2024-08-14 DIAGNOSIS — I10 Essential (primary) hypertension: Secondary | ICD-10-CM | POA: Diagnosis not present

## 2024-08-14 DIAGNOSIS — Z720 Tobacco use: Secondary | ICD-10-CM

## 2024-08-14 DIAGNOSIS — I2510 Atherosclerotic heart disease of native coronary artery without angina pectoris: Secondary | ICD-10-CM | POA: Diagnosis not present

## 2024-08-14 NOTE — Patient Instructions (Signed)
 Medication Instructions:  No changes *If you need a refill on your cardiac medications before your next appointment, please call your pharmacy*  Lab Work: None ordered If you have labs (blood work) drawn today and your tests are completely normal, you will receive your results only by: MyChart Message (if you have MyChart) OR A paper copy in the mail If you have any lab test that is abnormal or we need to change your treatment, we will call you to review the results.  Testing/Procedures: None ordered  Follow-Up: At [facility], you and your health needs are our priority.  As part of our continuing mission to provide you with exceptional heart care, our providers are all part of one team.  This team includes your primary Cardiologist (physician) and Advanced Practice Providers or APPs (Physician Assistants and Nurse Practitioners) who all work together to provide you with the care you need, when you need it.  Your next appointment:   6 month(s)  Provider:   Dr. Alvenia Aus  We recommend signing up for the patient portal called "MyChart".  Sign up information is provided on this After Visit Summary.  MyChart is used to connect with patients for Virtual Visits (Telemedicine).  Patients are able to view lab/test results, encounter notes, upcoming appointments, etc.  Non-urgent messages can be sent to your provider as well.   To learn more about what you can do with MyChart, go to [email or web address].

## 2024-08-16 ENCOUNTER — Other Ambulatory Visit: Payer: Self-pay | Admitting: Adult Health

## 2024-08-16 DIAGNOSIS — E118 Type 2 diabetes mellitus with unspecified complications: Secondary | ICD-10-CM

## 2024-08-21 ENCOUNTER — Encounter: Payer: Self-pay | Admitting: Adult Health

## 2024-08-21 ENCOUNTER — Ambulatory Visit (INDEPENDENT_AMBULATORY_CARE_PROVIDER_SITE_OTHER): Admitting: Adult Health

## 2024-08-21 VITALS — BP 132/82 | HR 68 | Temp 97.8°F | Ht 63.5 in | Wt 159.4 lb

## 2024-08-21 DIAGNOSIS — F17210 Nicotine dependence, cigarettes, uncomplicated: Secondary | ICD-10-CM

## 2024-08-21 DIAGNOSIS — J0101 Acute recurrent maxillary sinusitis: Secondary | ICD-10-CM

## 2024-08-21 MED ORDER — CEPHALEXIN 500 MG PO CAPS: 500.0000 mg | ORAL_CAPSULE | Freq: Two times a day (BID) | ORAL | 0 refills | Status: AC

## 2024-08-21 NOTE — Progress Notes (Signed)
 Subjective:    Patient ID: [name], female    DOB: 10-13-1955, 68 y.o.   MRN: [id number]  HPI  History of Present Illness   Discussed the use of AI scribe software for clinical note transcription with the patient, who gave verbal consent to proceed.  History of Present Illness   [name] is a 68 year old female with recurrent sinus infections who presents with symptoms of sinus congestion and postnasal drip.  She has significant sinus congestion and postnasal drip that cause gagging and choking, with constant cough that is worse at night and worse over the last month. She denies fevers or chills. Flonase  has not helped and trigger nosebleeds. Augmentin  gave only temporary relief in the past. She is allergic to doxycycline.  She has cut down smoking to less than half a pack per day and is working on quitting.        Review of Systems See HPI   Past Medical History:  Diagnosis Date   Acute MI, inferior wall, initial episode of care (HCC) 07/08/2012   Cath-07/08/11 -distal RCA stent DES (99%), LAD 20-30%. EF 50% inferior hypokinesis  (infarct)   Anxiety    Asthma    Coronary artery disease    Depression    Diabetes mellitus    Fibromyalgia    Leukocytosis    Monocytosis    Peripheral arterial disease 01/23/2013   Tobacco use disorder 07/08/2012    Social History   Socioeconomic History   Marital status: Married    Spouse name: Not on file   Number of children: 3   Years of education: college   Highest education level: Bachelor's degree (e.g., BA, AB, BS)  Occupational History   Not on file  Tobacco Use   Smoking status: Every Day    Current packs/day: 1.00    Average packs/day: 1 pack/day for 43.0 years (43.0 ttl pk-yrs)    Types: Cigarettes   Smokeless tobacco: Never   Tobacco comments:    Currently 1/2 ppd 02/29/24  Substance and Sexual Activity   Alcohol use: No    Alcohol/week: 0.0 standard drinks of alcohol    Comment: Rarely   Drug  use: No   Sexual activity: Yes  Other Topics Concern   Not on file  Social History Narrative   Lives with husband, married for 20 years.      They have three grown chlildren.   She is not currently working and is on disability   Highest level of education:  Engineer, maintenance (it)   Pets: two dogs and three rabbits.       Husband is a long production assistant, radio.       Is unable to enjoy any activities because of body pains.       Social Drivers of Corporate Investment Banker Strain: Low Risk  (04/13/2023)   Overall Financial Resource Strain (CARDIA)    Difficulty of Paying Living Expenses: Not hard at all  Food Insecurity: Patient Declined (05/13/2023)   Hunger Vital Sign    Worried About Running Out of Food in the Last Year: Patient declined    Ran Out of Food in the Last Year: Patient declined  Transportation Needs: Unmet Transportation Needs (05/13/2023)   PRAPARE - Administrator, Civil Service (Medical): Yes    Lack of Transportation (Non-Medical): Yes  Physical Activity: Unknown (05/13/2023)   Exercise Vital Sign    Days of Exercise per Week: Patient declined  Minutes of Exercise per Session: 0 min  Recent Concern: Physical Activity - Inactive (04/13/2023)   Exercise Vital Sign    Days of Exercise per Week: 0 days    Minutes of Exercise per Session: 0 min  Stress: Stress Concern Present (05/13/2023)   [employer] of Occupational Health - Occupational Stress Questionnaire    Feeling of Stress : Very much  Social Connections: Moderately Isolated (05/13/2023)   Social Connection and Isolation Panel    Frequency of Communication with Friends and Family: Once a week    Frequency of Social Gatherings with Friends and Family: Never    Attends Religious Services: 1 to 4 times per year    Active Member of [employer] or Organizations: No    Attends Banker Meetings: Never    Marital Status: Married  Catering Manager Violence: Not At Risk (04/13/2023)    Humiliation, Afraid, Rape, and Kick questionnaire    Fear of Current or Ex-Partner: No    Emotionally Abused: No    Physically Abused: No    Sexually Abused: No    Past Surgical History:  Procedure Laterality Date   ABDOMINAL AORTAGRAM N/A 02/07/2013   Procedure: ABDOMINAL EZELLA;  Surgeon: Deatrice DELENA Cage, MD;  Location: [facility] CATH LAB;  Service: Cardiovascular;  Laterality: N/A;   ABDOMINAL AORTOGRAM W/LOWER EXTREMITY N/A 11/09/2023   Procedure: ABDOMINAL AORTOGRAM W/LOWER EXTREMITY;  Surgeon: Cage Deatrice DELENA, MD;  Location: [facility] INVASIVE CV LAB;  Service: Cardiovascular;  Laterality: N/A;   CARDIAC CATHETERIZATION     CORONARY STENT PLACEMENT     CYST REMOVAL TRUNK  1977   tailbone   LEFT HEART CATHETERIZATION WITH CORONARY ANGIOGRAM N/A 07/07/2012   Procedure: LEFT HEART CATHETERIZATION WITH CORONARY ANGIOGRAM;  Surgeon: Lonni JONETTA Cash, MD;  Location: [facility] CATH LAB;  Service: Cardiovascular;  Laterality: N/A;   PERCUTANEOUS CORONARY STENT INTERVENTION (PCI-S)  07/07/2012   Procedure: PERCUTANEOUS CORONARY STENT INTERVENTION (PCI-S);  Surgeon: Lonni JONETTA Cash, MD;  Location: [facility] CATH LAB;  Service: Cardiovascular;;   PERIPHERAL VASCULAR BALLOON ANGIOPLASTY Right 11/09/2023   Procedure: PERIPHERAL VASCULAR BALLOON ANGIOPLASTY;  Surgeon: Cage Deatrice DELENA, MD;  Location: [facility] INVASIVE CV LAB;  Service: Cardiovascular;  Laterality: Right;  Ext Iliac   PERIPHERAL VASCULAR INTERVENTION Right 11/09/2023   Procedure: PERIPHERAL VASCULAR INTERVENTION;  Surgeon: Cage Deatrice DELENA, MD;  Location: [facility] INVASIVE CV LAB;  Service: Cardiovascular;  Laterality: Right;  Ext Iliac   TONSILLECTOMY      Family History  Problem Relation Age of Onset   Kidney disease Mother    Diabetes Mother        DM Type II   Alzheimer's disease Mother    Diabetes Father        IDDM   Heart disease Father    Cancer Father        bone cancer died at 21   Cancer Sister        bone cancer at age 35    Multiple sclerosis Daughter    Diabetes Son 2       DM Type I   Heart disease Maternal Grandfather    Heart disease Paternal Grandmother    Diabetes Son 71       DM Type I    Allergies  Allergen Reactions   Erythromycin Other (See Comments)   Statins     SEVERE MUSCLE PAIN - failed lipitor , Livalo , red yeast rice (lovastatin), and she thinks Zocor and pravastatin    Ace Inhibitors Cough  Patient refuses to take meds   Cymbalta [Duloxetine Hcl]     Drowsiness   Doxycycline Other (See Comments)    diarrhea   Jardiance [Empagliflozin]     Yeast infections     Fentanyl  Anxiety    Current Outpatient Medications on File Prior to Visit  Medication Sig Dispense Refill   aspirin  EC 81 MG tablet Take 1 tablet (81 mg total) by mouth daily. Swallow whole.     budesonide -formoterol  (SYMBICORT ) 80-4.5 MCG/ACT inhaler Take 2 puffs first thing in am and then another 2 puffs about 12 hours later. 1 each 12   busPIRone  (BUSPAR ) 15 MG tablet TAKE 1 TABLET BY MOUTH THREE TIMES DAILY 270 tablet 0   Cholecalciferol (VITAMIN D3) 2000 units TABS Take 1 tablet by mouth daily.     clopidogrel  (PLAVIX ) 75 MG tablet Take 1 tablet by mouth once daily 30 tablet 5   Continuous Glucose Receiver (FREESTYLE LIBRE 3 READER) DEVI Use as directed with sensor to monitor blood sugar continuously. Dx. E11.8 1 each 0   Continuous Glucose Sensor (FREESTYLE LIBRE 3 PLUS SENSOR) MISC USE TO MONITOR BLOOD SUGAR CONTINOUSLY, CHANGE SENSOR EVERY 15 DAYS 6 each 3   glipiZIDE  (GLUCOTROL  XL) 10 MG 24 hr tablet Take 1 tablet by mouth once daily with breakfast 90 tablet 0   insulin  lispro (HUMALOG  KWIKPEN) 100 UNIT/ML KwikPen Inject 0-6 Units into the skin 3 (three) times daily. Maximum 20 units/day as per sliding scale provided. 15 mL 4   Insulin  Pen Needle 32G X 4 MM MISC Use 4x a day 300 each 3   isosorbide  mononitrate (IMDUR ) 60 MG 24 hr tablet TAKE 1 & 1/2 (ONE & ONE-HALF) TABLETS BY MOUTH ONCE DAILY 135 tablet 0   LANTUS   SOLOSTAR 100 UNIT/ML Solostar Pen Inject 20 Units into the skin daily. 15 mL 5   levETIRAcetam  (KEPPRA ) 250 MG tablet TAKE 1 TABLET BY MOUTH THREE TIMES DAILY 270 tablet 0   nitroGLYCERIN  (NITROSTAT ) 0.4 MG SL tablet Place 1 tablet (0.4 mg total) under the tongue every 5 (five) minutes x 3 doses as needed for chest pain. 25 tablet 3   nystatin  cream (MYCOSTATIN ) Apply 1 application topically 2 (two) times daily. 30 g 2   pravastatin  (PRAVACHOL ) 80 MG tablet Take 1 tablet (80 mg total) by mouth every evening. 90 tablet 3   pregabalin  (LYRICA ) 150 MG capsule Take 1 capsule by mouth twice daily 60 capsule 2   No current facility-administered medications on file prior to visit.    BP 132/82   Pulse 68   Temp 97.8 F (36.6 C)   Ht 5' 3.5 (1.613 m)   Wt 159 lb 6.4 oz (72.3 kg)   SpO2 98%   BMI 27.79 kg/m       Objective:   Physical Exam Vitals and nursing note reviewed.  Constitutional:      Appearance: Normal appearance.  HENT:     Nose: Congestion and rhinorrhea present. Rhinorrhea is purulent.     Right Turbinates: Enlarged and swollen.     Left Turbinates: Swollen.     Right Sinus: Maxillary sinus tenderness present.     Left Sinus: Maxillary sinus tenderness present.     Mouth/Throat:     Pharynx: Oropharynx is clear. Uvula midline. Postnasal drip present. No pharyngeal swelling or oropharyngeal exudate.  Cardiovascular:     Rate and Rhythm: Normal rate and regular rhythm.     Pulses: Normal pulses.     Heart sounds: Normal  heart sounds.  Musculoskeletal:        General: Normal range of motion.  Skin:    General: Skin is warm and dry.  Neurological:     General: No focal deficit present.     Mental Status: She is alert and oriented to person, place, and time.  Psychiatric:        Mood and Affect: Mood normal.        Behavior: Behavior normal.        Thought Content: Thought content normal.        Judgment: Judgment normal.        Assessment & Plan:  Assessment  and Plan    Acute recurrent maxillary sinusitis Recurrent sinus infections with postnasal drainage, cough, and congestion. Augmentin  ineffective. Allergic to doxycycline. ENT referral considered. - Prescribed Keflex  twice a day for seven days. - Referred to ENT for further evaluation.  Nicotine dependence, cigarettes Current smoker, reduced to half a pack per day. - Encouraged smoking cessation efforts.      Vere Diantonio, NP  I personally spent a total of 30 minutes in the care of the patient today including preparing to see the patient, getting/reviewing separately obtained history, performing a medically appropriate exam/evaluation, counseling and educating, placing orders, and documenting clinical information in the EHR.

## 2024-08-22 ENCOUNTER — Encounter (INDEPENDENT_AMBULATORY_CARE_PROVIDER_SITE_OTHER): Payer: Self-pay

## 2024-08-24 ENCOUNTER — Other Ambulatory Visit: Payer: Self-pay | Admitting: Adult Health

## 2024-08-24 DIAGNOSIS — M5412 Radiculopathy, cervical region: Secondary | ICD-10-CM

## 2024-09-12 ENCOUNTER — Telehealth: Payer: Self-pay | Admitting: Cardiovascular Disease

## 2024-09-12 NOTE — Telephone Encounter (Signed)
°  Per MyChart scheduling message:  Dr. Lindi: Good morning Would like to see if I can set the appointment for stent. Legs are hurting more and becoming weaker. I have fallen but am okay. I feel sooner than later. Could someone call me? Thank you [name]

## 2024-09-12 NOTE — Telephone Encounter (Signed)
 Left a message for the patient to call back.

## 2024-09-12 NOTE — Telephone Encounter (Signed)
 Left message for pt to call back. Will forward along to Dr Darron regarding decision on stent.

## 2024-09-13 ENCOUNTER — Other Ambulatory Visit: Payer: Self-pay | Admitting: Adult Health

## 2024-09-13 DIAGNOSIS — R569 Unspecified convulsions: Secondary | ICD-10-CM

## 2024-09-13 DIAGNOSIS — M5412 Radiculopathy, cervical region: Secondary | ICD-10-CM

## 2024-09-13 NOTE — Telephone Encounter (Signed)
 Schedule her for a follow-up visit with me on January 6 with plans to proceed with angiography on January 16.

## 2024-09-13 NOTE — Telephone Encounter (Signed)
 Patient returned RN's call and stated she can do an appointment on 1/6 but it would need to be in the afternoon.

## 2024-09-14 NOTE — Telephone Encounter (Signed)
 Left a message for the patient to call back. Dr. Arida is only in [place] during the morning on Tuesdays.

## 2024-09-14 NOTE — Telephone Encounter (Signed)
 Okay for refill?

## 2024-09-17 ENCOUNTER — Telehealth: Payer: Self-pay | Admitting: *Deleted

## 2024-09-17 NOTE — Telephone Encounter (Signed)
 Copied from CRM #[id number]. Topic: Clinical - Medication Question >> Sep 17, 2024  1:39 PM [name] wrote: Reason for CRM: Patient is calling because she is needing her lyrica  sent in by tonight. She is very worried about this not being sent in on time. She said that without the medication, it makes her very sick.  As of now, the requests are pending for approval/denial.

## 2024-09-17 NOTE — Telephone Encounter (Signed)
 Spoke with the patient. The earliest she could come in is on 1/20 with Dr. Darron. She has been  advised to call sooner if she develops worsening symptoms.

## 2024-09-17 NOTE — Telephone Encounter (Signed)
 Pt returning call. Please advise.

## 2024-09-18 ENCOUNTER — Telehealth: Payer: Self-pay

## 2024-09-18 NOTE — Telephone Encounter (Signed)
 Patient notified of update  and verbalized understanding.

## 2024-09-18 NOTE — Telephone Encounter (Signed)
 PPW came in from [employer] advising if pt has DM or any cardiovascular disorders. PPW faxed with confirmation.

## 2024-09-18 NOTE — Telephone Encounter (Signed)
 This was refilled. Will call pt to inform.

## 2024-10-01 LAB — OPHTHALMOLOGY REPORT-SCANNED

## 2024-10-03 ENCOUNTER — Encounter (INDEPENDENT_AMBULATORY_CARE_PROVIDER_SITE_OTHER): Payer: Self-pay

## 2024-10-03 ENCOUNTER — Ambulatory Visit (INDEPENDENT_AMBULATORY_CARE_PROVIDER_SITE_OTHER)

## 2024-10-03 VITALS — BP 137/89 | HR 87 | Ht 63.5 in | Wt 160.0 lb

## 2024-10-03 DIAGNOSIS — J309 Allergic rhinitis, unspecified: Secondary | ICD-10-CM

## 2024-10-03 DIAGNOSIS — J348210 External nasal valve collapse, unspecified: Secondary | ICD-10-CM | POA: Diagnosis not present

## 2024-10-03 DIAGNOSIS — J3489 Other specified disorders of nose and nasal sinuses: Secondary | ICD-10-CM | POA: Diagnosis not present

## 2024-10-03 DIAGNOSIS — J348200 Internal nasal valve collapse, unspecified: Secondary | ICD-10-CM

## 2024-10-03 MED ORDER — AYR SALINE NASAL NA GEL: 1.0000 | Freq: Every evening | NASAL | 0 refills | Status: DC

## 2024-10-03 MED ORDER — AZELASTINE HCL 0.1 % NA SOLN: 2.0000 | Freq: Two times a day (BID) | NASAL | 12 refills | Status: DC

## 2024-10-03 NOTE — Progress Notes (Signed)
 Dear Dr. Merna, Here is my assessment for our mutual patient, [name]. Thank you for allowing me the opportunity to care for your patient. Please do not hesitate to contact me should you have any other questions. Sincerely, Dr. Hadassah Parody  Otolaryngology Clinic Note Referring provider: Dr. Merna HPI:   Initial HPI (10/03/2024)  69 year old female with chronic nasal obstruction who presents with worsening nasal congestion and inability breathe through her nose.  Reports lifelong nasal congestion and sinus symptoms which she believes are sinus infections.  She describes her sinus infections mostly as nasal obstruction and nasal congestion rather than pain.  This has been significantly worsening over the past year.  Unable to breathe through either side of her nose, left side is worse than the right.  Sensation of tightness in the nose.  Dry cold air can exacerbate her symptoms.  Breathe Right strips have been used in the past but do not adhere to her skin well so she does not use them. Flonase  is not very helpful to her and causes nosebleeds.  Nasacort ineffective.  Her main concern today is the inability to breathe through her nose.  She gets some postnasal drainage as well  She reports severe nasal dryness and oral dryness despite use of a humidifier.   Independent Review of Additional Tests or Records:  Referral note 08/21/2024 Darleene Merna, NP: Patient having sinus congestion and postnasal drip given Keflex  and referred to ENT  CT sinus 01/27/2024 independently reviewed showing mostly clear sinuses throughout with the exception of very mild mucosal thickening in the right maxillary sinus.  No significant nasal septal deviation.  Inferior turbinates are not significantly hypertrophied.   PMH/Meds/All/SocHx/FamHx/ROS:   Past Medical History:  Diagnosis Date   Acute MI, inferior wall, initial episode of care (HCC) 07/08/2012   Cath-07/08/11 -distal RCA stent DES  (99%), LAD 20-30%. EF 50% inferior hypokinesis  (infarct)   Anxiety    Asthma    Coronary artery disease    Depression    Diabetes mellitus    Fibromyalgia    Leukocytosis    Monocytosis    Peripheral arterial disease 01/23/2013   Tobacco use disorder 07/08/2012     Past Surgical History:  Procedure Laterality Date   ABDOMINAL AORTAGRAM N/A 02/07/2013   Procedure: ABDOMINAL EZELLA;  Surgeon: Deatrice DELENA Cage, MD;  Location: [facility] CATH LAB;  Service: Cardiovascular;  Laterality: N/A;   ABDOMINAL AORTOGRAM W/LOWER EXTREMITY N/A 11/09/2023   Procedure: ABDOMINAL AORTOGRAM W/LOWER EXTREMITY;  Surgeon: Cage Deatrice DELENA, MD;  Location: [facility] INVASIVE CV LAB;  Service: Cardiovascular;  Laterality: N/A;   CARDIAC CATHETERIZATION     CORONARY STENT PLACEMENT     CYST REMOVAL TRUNK  1977   tailbone   LEFT HEART CATHETERIZATION WITH CORONARY ANGIOGRAM N/A 07/07/2012   Procedure: LEFT HEART CATHETERIZATION WITH CORONARY ANGIOGRAM;  Surgeon: Lonni JONETTA Cash, MD;  Location: [facility] CATH LAB;  Service: Cardiovascular;  Laterality: N/A;   PERCUTANEOUS CORONARY STENT INTERVENTION (PCI-S)  07/07/2012   Procedure: PERCUTANEOUS CORONARY STENT INTERVENTION (PCI-S);  Surgeon: Lonni JONETTA Cash, MD;  Location: [facility] CATH LAB;  Service: Cardiovascular;;   PERIPHERAL VASCULAR BALLOON ANGIOPLASTY Right 11/09/2023   Procedure: PERIPHERAL VASCULAR BALLOON ANGIOPLASTY;  Surgeon: Cage Deatrice DELENA, MD;  Location: [facility] INVASIVE CV LAB;  Service: Cardiovascular;  Laterality: Right;  Ext Iliac   PERIPHERAL VASCULAR INTERVENTION Right 11/09/2023   Procedure: PERIPHERAL VASCULAR INTERVENTION;  Surgeon: Cage Deatrice DELENA, MD;  Location: [facility] INVASIVE CV LAB;  Service: Cardiovascular;  Laterality:  Right;  Ext Iliac   TONSILLECTOMY      Family History  Problem Relation Age of Onset   Kidney disease Mother    Diabetes Mother        DM Type II   Alzheimer's disease Mother    Diabetes Father        IDDM   Heart disease  Father    Cancer Father        bone cancer died at 52   Cancer Sister        bone cancer at age 74   Multiple sclerosis Daughter    Diabetes Son 2       DM Type I   Heart disease Maternal Grandfather    Heart disease Paternal Grandmother    Diabetes Son 5       DM Type I     Social Connections: Moderately Isolated (05/13/2023)   Social Connection and Isolation Panel    Frequency of Communication with Friends and Family: Once a week    Frequency of Social Gatherings with Friends and Family: Never    Attends Religious Services: 1 to 4 times per year    Active Member of [employer] or Organizations: No    Attends Engineer, Structural: Never    Marital Status: Married     Current Outpatient Medications  Medication Instructions   aspirin  EC 81 mg, Oral, Daily, Swallow whole.   azelastine  (ASTELIN ) 0.1 % nasal spray 2 sprays, Each Nare, 2 times daily, Use in each nostril as directed   budesonide -formoterol  (SYMBICORT ) 80-4.5 MCG/ACT inhaler Take 2 puffs first thing in am and then another 2 puffs about 12 hours later.   busPIRone  (BUSPAR ) 15 mg, Oral, 3 times daily   Cholecalciferol (VITAMIN D3) 2000 units TABS 1 tablet, Daily   clopidogrel  (PLAVIX ) 75 mg, Oral, Daily   Continuous Glucose Receiver (FREESTYLE LIBRE 3 READER) DEVI Use as directed with sensor to monitor blood sugar continuously. Dx. E11.8   Continuous Glucose Sensor (FREESTYLE LIBRE 3 PLUS SENSOR) MISC USE TO MONITOR BLOOD SUGAR CONTINOUSLY, CHANGE SENSOR EVERY 15 DAYS   glipiZIDE  (GLUCOTROL  XL) 10 mg, Oral, Daily with breakfast   insulin  lispro (HUMALOG  KWIKPEN) 0-6 Units, Subcutaneous, 3 times daily, Maximum 20 units/day as per sliding scale provided.   Insulin  Pen Needle 32G X 4 MM MISC Use 4x a day   isosorbide  mononitrate (IMDUR ) 60 MG 24 hr tablet TAKE 1 & 1/2 (ONE & ONE-HALF) TABLETS BY MOUTH ONCE DAILY   Lantus  SoloStar 20 Units, Subcutaneous, Daily   levETIRAcetam  (KEPPRA ) 250 mg, Oral, 3 times daily    nitroGLYCERIN  (NITROSTAT ) 0.4 mg, Sublingual, Every 5 min x3 PRN   nystatin  cream (MYCOSTATIN ) 1 application , Topical, 2 times daily   pravastatin  (PRAVACHOL ) 80 mg, Oral, Every evening   pregabalin  (LYRICA ) 150 mg, Oral, 2 times daily   saline (AYR) GEL 1 Application, Each Nare, Nightly     Physical Exam:   BP 137/89 (BP Location: Right Arm, Patient Position: Sitting)   Pulse 87   Ht 5' 3.5 (1.613 m)   Wt 160 lb (72.6 kg)   SpO2 92%   BMI 27.90 kg/m   Salient findings:  CN II-XII intact    Bilateral EAC clear and TM intact with well pneumatized middle ear spaces  Anterior rhinoscopy: Septum midline, nasal crusting and dryness present; bilateral inferior turbinates with mild hypertrophy Bulbous nasal tip with very weak upper lateral cartilages, weak lower lateral cartilages Modified Cottle maneuver strongly positive  bilaterally When she breathes in through her nose, can see external valve collapse  No lesions of oral cavity/oropharynx Significant mucosal dryness in the mouth  No obviously palpable neck masses/lymphadenopathy/thyromegaly  No respiratory distress or stridor  Seprately Identifiable Procedures:  Prior to initiating any procedures, risks/benefits/alternatives were explained to the patient and verbal consent obtained. None  Impression & Plans:  [name] is a 69 y.o. female with   1. Nasal obstruction   2. Collapse of internal nasal valve   3. Collapse of external nasal valve   4. Allergic rhinitis, unspecified seasonality, unspecified trigger   5. 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  septum is midline and CT imaging confirms this and  shows no significant sinus disease. The structural collapse results in significant functional impairment of nasal airflow.  Breathe Right strips have not helped her in the past given that they do not stick to her skin.  Recommend she could try magnetic breathing strips (similar to Breathe Right strips) versus referral to surgeon who can perform septorhinoplasty for internal and external nasal valve collapse.  At this time she would like to trial the magnetic breathing strips. - Recommended trial of magnetic nasal breathing strips to provide external support and increase nasal valve patency. - Advised that if magnetic strips are ineffective, referral to subspecialist for nasal valve reconstruction with cartilage grafting can be arranged.  Chronic allergic rhinitis She has persistent allergic rhinitis with nasal congestion and postnasal drip. Prior use of intranasal corticosteroids (Flonase ) resulted in epistaxis.  Nasacort in the past has been ineffective.  Nasal sprays may provide symptomatic relief but are unlikely to fully resolve obstruction due to structural collapse. - Trial Astelin  but this may worsen the dryness of her nose and she should stop if she notices any nasal bleeding - Encouraged continued use of humidifier at home to alleviate dryness and allergy symptoms.  Nasal dryness She has significant nasal dryness, likely exacerbated by environmental factors and prior intranasal corticosteroid use. Exam revealed dry, raw nasal mucosa. - Recommended saline gel to both nostrils at bedtime to maintain mucosal moisture. - Sent prescription for saline gel and advised it is also available over the counter. - Encouraged continued use of humidifier at home and consideration of additional units if needed. - Stop Astelin  as above if she notices any nasal bleeding    See below regarding exact medications prescribed this encounter including dosages and route: Meds ordered this encounter  Medications    azelastine  (ASTELIN ) 0.1 % nasal spray    Sig: Place 2 sprays into both nostrils 2 (two) times daily. Use in each nostril as directed    Dispense:  30 mL    Refill:  12   saline (AYR) GEL    Sig: Place 1 Application into both nostrils at bedtime.    Dispense:  14.1 g    Refill:  0      Thank you for allowing me the opportunity to care for your patient. Please do not hesitate to contact me should you have any other questions.  Sincerely, Hadassah Parody, MD Otolaryngologist (ENT), [facility] Phone: [phone] Fax: [phone]  MDM:  Level 4 Complexity/Problems addressed: 4-multiple chronic problem and chronic worsening problems Data complexity:  4-  independent review of CT sinus and referral note - Morbidity: 4-prescription drug management - Prescription Drug prescribed or managed: yes

## 2024-10-05 ENCOUNTER — Encounter: Payer: Self-pay | Admitting: Internal Medicine

## 2024-10-12 ENCOUNTER — Other Ambulatory Visit: Payer: Self-pay | Admitting: Adult Health

## 2024-10-12 DIAGNOSIS — E1165 Type 2 diabetes mellitus with hyperglycemia: Secondary | ICD-10-CM

## 2024-10-12 DIAGNOSIS — I2119 ST elevation (STEMI) myocardial infarction involving other coronary artery of inferior wall: Secondary | ICD-10-CM

## 2024-10-12 DIAGNOSIS — M5412 Radiculopathy, cervical region: Secondary | ICD-10-CM

## 2024-10-12 NOTE — Telephone Encounter (Signed)
 Okay for refill?

## 2024-10-16 ENCOUNTER — Encounter: Payer: Self-pay | Admitting: Cardiovascular Disease

## 2024-10-16 ENCOUNTER — Ambulatory Visit: Admitting: Cardiovascular Disease

## 2024-10-16 VITALS — BP 180/90 | HR 90 | Ht 63.0 in | Wt 157.7 lb

## 2024-10-16 DIAGNOSIS — E785 Hyperlipidemia, unspecified: Secondary | ICD-10-CM | POA: Diagnosis not present

## 2024-10-16 DIAGNOSIS — I2510 Atherosclerotic heart disease of native coronary artery without angina pectoris: Secondary | ICD-10-CM

## 2024-10-16 DIAGNOSIS — I739 Peripheral vascular disease, unspecified: Secondary | ICD-10-CM

## 2024-10-16 DIAGNOSIS — I10 Essential (primary) hypertension: Secondary | ICD-10-CM

## 2024-10-16 DIAGNOSIS — Z720 Tobacco use: Secondary | ICD-10-CM | POA: Diagnosis not present

## 2024-10-16 NOTE — Patient Instructions (Signed)
 Medication Instructions:  No changes *If you need a refill on your cardiac medications before your next appointment, please call your pharmacy*  Follow-Up: At [facility], you and your health needs are our priority.  As part of our continuing mission to provide you with exceptional heart care, our providers are all part of one team.  This team includes your primary Cardiologist (physician) and Advanced Practice Providers or APPs (Physician Assistants and Nurse Practitioners) who all work together to provide you with the care you need, when you need it.  Your next appointment:   Keep your follow up on 12/18/24 with Dr. Darron  We recommend signing up for the patient portal called MyChart.  Sign up information is provided on this After Visit Summary.  MyChart is used to connect with patients for Virtual Visits (Telemedicine).  Patients are able to view lab/test results, encounter notes, upcoming appointments, etc.  Non-urgent messages can be sent to your provider as well.   To learn more about what you can do with MyChart, go to [email or web address].   Other Instructions  [facility] DEPT OF [place] [address] A DEPT OF THE [place] [address] [place] [address] Dept: [phone] Loc: [phone]  [name]  10/16/2024  You are scheduled for a Peripheral Angiogram on Friday, February 27 with Dr. Deatrice Darron.  1. Please arrive at the [facility] (Main Entrance A) at [facility]: [address] [place], [place] [address] at 6:00 AM (This time is 4.5 hour(s) before your procedure to ensure your preparation).   Free valet parking service is available. You will check in at ADMITTING. The support person will be asked to wait in the waiting room.  It is OK to have someone drop you off and come back when you are ready to be discharged.    Special note: Every effort is made to have your procedure  done on time. Please understand that emergencies sometimes delay scheduled procedures.  2. Diet: Nothing to eat after midnight.   3. Hydration: You need to be well hydrated before your procedure. On February 27, you may drink approved liquids (see below) until 2 hours before the procedure, with 16 oz of water as your last intake.   List of approved liquids water, clear juice, clear tea, black coffee, fruit juices, non-citric and without pulp, carbonated beverages, Gatorade, Kool -Aid, plain Jello-O and plain ice popsicles.  4. Labs: You will need to have blood drawn by 11/16/24.You do not need to be fasting. You do not need an appointment for the lab. The lab is located on the first floor at [address] [facility]. The lab is open from 8:00 am to 4:30 pm  You may also go to any of these [facility] locations:   [facility] - [address] ([facility]) - [address] [phone] [address]   [employer]  - [name]     - [address] - [address] [employer]  - [address] ([facility])   5. Medication instructions in preparation for your procedure: Hold all diabetic medication the morning of the procedure  -take half the dose of the lantus  the night before the procedure  On the morning of your procedure, take your Aspirin  81 mg and Plavix /Clopidogrel  and any  morning medicines NOT listed above.  You may use sips of water.  6. Plan to go home the same day, you will only stay overnight if medically necessary. 7. Bring a current list of your medications and current insurance cards. 8. You MUST have a responsible person to drive you home. 9. Someone MUST be with you the first 24 hours after you arrive home or your discharge will be delayed. 10. Please wear clothes that are easy to get on and off and wear slip-on shoes.  Thank you for allowing us  to care for  you!   -- [facility]
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
## 2024-10-17 ENCOUNTER — Telehealth: Payer: Self-pay | Admitting: Cardiovascular Disease

## 2024-10-17 NOTE — Telephone Encounter (Signed)
 Called to talk with patient about her experience on 10/16/24 in our clinic. Left my direct line and let her know she could leave a VM if I did not pick up. Please transfer to me for my assistance.   [phone]

## 2024-10-31 ENCOUNTER — Ambulatory Visit: Payer: Self-pay

## 2024-10-31 NOTE — Telephone Encounter (Signed)
 Patient/caller refused triage.  Reason for refusal: says she just wants an appointment, symptoms have been ongoing x 50 years. .    Message from [place] S sent at 10/31/2024  2:25 PM EST  Reason for Triage: patient calling regarding mental health dealing with ptsd and says she is having panic attack. Hard for her to go into the stores or be around others. Need someone to talk to in order to get on track

## 2024-11-01 ENCOUNTER — Ambulatory Visit: Admitting: Adult Health

## 2024-11-01 ENCOUNTER — Ambulatory Visit: Payer: Self-pay | Admitting: Endocrinology

## 2024-11-01 ENCOUNTER — Ambulatory Visit: Admitting: Endocrinology

## 2024-11-01 ENCOUNTER — Encounter: Payer: Self-pay | Admitting: Endocrinology

## 2024-11-01 ENCOUNTER — Ambulatory Visit: Payer: Self-pay | Admitting: Cardiovascular Disease

## 2024-11-01 ENCOUNTER — Encounter: Payer: Self-pay | Admitting: Adult Health

## 2024-11-01 VITALS — BP 120/88 | HR 84 | Temp 98.0°F | Ht 63.0 in | Wt 155.0 lb

## 2024-11-01 VITALS — BP 140/72 | HR 94 | Resp 16 | Ht 63.0 in | Wt 157.2 lb

## 2024-11-01 DIAGNOSIS — E1165 Type 2 diabetes mellitus with hyperglycemia: Secondary | ICD-10-CM

## 2024-11-01 DIAGNOSIS — Z760 Encounter for issue of repeat prescription: Secondary | ICD-10-CM

## 2024-11-01 DIAGNOSIS — I739 Peripheral vascular disease, unspecified: Secondary | ICD-10-CM

## 2024-11-01 DIAGNOSIS — F419 Anxiety disorder, unspecified: Secondary | ICD-10-CM

## 2024-11-01 LAB — CBC
HCT: 48.9 % — ABNORMAL HIGH (ref 36.0–46.0)
Hemoglobin: 16.2 g/dL — ABNORMAL HIGH (ref 12.0–15.0)
MCHC: 33.2 g/dL (ref 30.0–36.0)
MCV: 93.5 fl (ref 78.0–100.0)
Platelets: 246 10*3/uL (ref 150.0–400.0)
RBC: 5.23 Mil/uL — ABNORMAL HIGH (ref 3.87–5.11)
RDW: 13.7 % (ref 11.5–15.5)
WBC: 15.3 10*3/uL — ABNORMAL HIGH (ref 4.0–10.5)

## 2024-11-01 LAB — BASIC METABOLIC PANEL WITH GFR
BUN: 30 mg/dL — ABNORMAL HIGH (ref 6–23)
CO2: 22 meq/L (ref 19–32)
Calcium: 10.5 mg/dL (ref 8.4–10.5)
Chloride: 108 meq/L (ref 96–112)
Creatinine, Ser: 1.31 mg/dL — ABNORMAL HIGH (ref 0.40–1.20)
GFR: 42.41 mL/min — ABNORMAL LOW
Glucose, Bld: 175 mg/dL — ABNORMAL HIGH (ref 70–99)
Potassium: 4.6 meq/L (ref 3.5–5.1)
Sodium: 141 meq/L (ref 135–145)

## 2024-11-01 LAB — POCT GLYCOSYLATED HEMOGLOBIN (HGB A1C): Hemoglobin A1C: 7.7 % — AB (ref 4.0–5.6)

## 2024-11-01 MED ORDER — BUSPIRONE HCL 30 MG PO TABS: 30.0000 mg | ORAL_TABLET | Freq: Two times a day (BID) | ORAL | 0 refills | Status: AC

## 2024-11-01 MED ORDER — INSULIN LISPRO (1 UNIT DIAL) 100 UNIT/ML (KWIKPEN): PEN_INJECTOR | SUBCUTANEOUS | 4 refills | Status: AC

## 2024-11-01 MED ORDER — NYSTATIN 100000 UNIT/GM EX CREA: 1.0000 | TOPICAL_CREAM | Freq: Two times a day (BID) | CUTANEOUS | 2 refills | Status: AC

## 2024-11-01 NOTE — Patient Instructions (Signed)
 Diabetes regimen  Lantus  20 units daily, once a day only.  Take humalog  5 units with supper and  Humalog  sliding scale as follows, to take before eating up to 3 times a day.  Sliding Scale Blood Glucose        Insulin  60-150                     None 151-200                   None 201-250                   1 units 251-300                   2 units 301-350                   3 units 351-400                   4 units      >400                        5 units and call provider   Continue glipizide  XL for now.

## 2024-11-01 NOTE — Patient Instructions (Addendum)
 I am going to refer you to behavioral health  Also contact [facility] [phone]
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
## 2024-11-01 NOTE — Progress Notes (Signed)
 "  Outpatient Endocrinology Note Ad Guttman, MD   Patient's Name: [name]    DOB: [birth year]    MRN: [id number]                                                    REASON OF VISIT: Follow up of type 2 diabetes mellitus  REFERRING PROVIDER: Merna Huxley, NP  PCP: Merna Huxley, NP  HISTORY OF PRESENT ILLNESS:   [name] is a 69 y.o. old female with past medical history listed below, is here for follow-up for type 2 diabetes mellitus.   Pertinent Diabetes History: Patient was referred to endocrinology for evaluation and management of uncontrolled type 2 diabetes mellitus.  Initial consult on August 02, 2024.  Patient was diagnosed with type 2 diabetes mellitus 30+ years ago.  Patient has suboptimal control of type 2 diabetes mellitus mostly hemoglobin A1c in the range of 7 to 8% range.  History of DKA or diabetes related hospitalizations: 1-2 times. ?  In 2019 at [facility].  Previous diabetes education: ? Yes   Family h/o diabetes mellitus: two sons with type 1 diabetes.  Multiple family members with type 2 diabetes mellitus.    Type 1 diabetes autoimmune panel were negative.  No personal history of pancreatitis and / or family history of medullary thyroid  carcinoma or MEN 2B syndrome.   Chronic Diabetes Complications : Retinopathy: no. Last ophthalmology exam was done on Due, following with ophthalmology regularly.  Nephropathy: CKD IIIb Peripheral neuropathy: no.  [name] is on Lyrica  for fibromyalgia.   Coronary artery disease: CAD s/p stents Stroke: no  Relevant comorbidities and cardiovascular risk factors: Obesity: no Body mass index is 27.85 kg/m.  Hypertension: Yes  Hyperlipidemia : Yes, on statin   Current / Home Diabetic regimen includes:  Lantus  20 units in the morning.  Humalog  sliding scale.  Glipizide  extended release 10 mg daily in the morning.  Prior diabetic medications: Metformin  in the past.  Patient reports it was discontinued  after [name] had DKA in the past.  Glycemic data:    CONTINUOUS GLUCOSE MONITORING SYSTEM (CGMS) INTERPRETATION: At today's visit, we reviewed CGM downloads. The full report is scanned in the media. Reviewing the CGM trends, blood glucose are as follows:  FreeStyle Libre 3+ CGM-  Sensor Download (Sensor download was reviewed and summarized below.) Dates: January 23 - November 01, 2024, 14 days    Interpretation: Mostly acceptable blood sugar.  With occasional hyperglycemia with blood sugar up to 300 postprandially and related to high carb meal.  Rare mild hypoglycemia related to use of correctional insulin .  No concerning hypoglycemia.  Hypoglycemia: Patient has minor hypoglycemic episodes. Patient has hypoglycemia awareness.  Factors modifying glucose control: 1.  Diabetic diet assessment: 1-3 minutes a day, no empiric meal, may snack frequently.  2.  Staying active or exercising:   3.  Medication compliance: compliant all of the time.  Interval history  CGM data as reviewed above.  Hemoglobin A1c slightly worsened to 7.7%.  Diabetes regimen as reviewed and noted above.  Patient reports lately [name] has not been as good on taking Humalog  sliding scale.  [name] has been taking Lantus  20 units daily in the morning.  [name] has no other complaints today.  REVIEW OF SYSTEMS As per history of present illness.   PAST MEDICAL HISTORY: Past Medical  History:  Diagnosis Date   Acute MI, inferior wall, initial episode of care (HCC) 07/08/2012   Cath-07/08/11 -distal RCA stent DES (99%), LAD 20-30%. EF 50% inferior hypokinesis  (infarct)   Anxiety    Asthma    Coronary artery disease    Depression    Diabetes mellitus    Fibromyalgia    Leukocytosis    Monocytosis    Peripheral arterial disease 01/23/2013   Tobacco use disorder 07/08/2012    PAST SURGICAL HISTORY: Past Surgical History:  Procedure Laterality Date   ABDOMINAL AORTAGRAM N/A 02/07/2013   Procedure: ABDOMINAL EZELLA;   Surgeon: Deatrice DELENA Cage, MD;  Location: [facility] CATH LAB;  Service: Cardiovascular;  Laterality: N/A;   ABDOMINAL AORTOGRAM W/LOWER EXTREMITY N/A 11/09/2023   Procedure: ABDOMINAL AORTOGRAM W/LOWER EXTREMITY;  Surgeon: Cage Deatrice DELENA, MD;  Location: [facility] INVASIVE CV LAB;  Service: Cardiovascular;  Laterality: N/A;   CARDIAC CATHETERIZATION     CORONARY STENT PLACEMENT     CYST REMOVAL TRUNK  1977   tailbone   LEFT HEART CATHETERIZATION WITH CORONARY ANGIOGRAM N/A 07/07/2012   Procedure: LEFT HEART CATHETERIZATION WITH CORONARY ANGIOGRAM;  Surgeon: Lonni JONETTA Cash, MD;  Location: [facility] CATH LAB;  Service: Cardiovascular;  Laterality: N/A;   PERCUTANEOUS CORONARY STENT INTERVENTION (PCI-S)  07/07/2012   Procedure: PERCUTANEOUS CORONARY STENT INTERVENTION (PCI-S);  Surgeon: Lonni JONETTA Cash, MD;  Location: [facility] CATH LAB;  Service: Cardiovascular;;   PERIPHERAL VASCULAR BALLOON ANGIOPLASTY Right 11/09/2023   Procedure: PERIPHERAL VASCULAR BALLOON ANGIOPLASTY;  Surgeon: Cage Deatrice DELENA, MD;  Location: [facility] INVASIVE CV LAB;  Service: Cardiovascular;  Laterality: Right;  Ext Iliac   PERIPHERAL VASCULAR INTERVENTION Right 11/09/2023   Procedure: PERIPHERAL VASCULAR INTERVENTION;  Surgeon: Cage Deatrice DELENA, MD;  Location: [facility] INVASIVE CV LAB;  Service: Cardiovascular;  Laterality: Right;  Ext Iliac   TONSILLECTOMY      ALLERGIES: Allergies  Allergen Reactions   Erythromycin Other (See Comments) and Nausea And Vomiting   Statins     SEVERE MUSCLE PAIN - failed lipitor , Livalo , red yeast rice (lovastatin), and [name] thinks Zocor and pravastatin    Ace Inhibitors Cough    Patient refuses to take meds   Cymbalta [Duloxetine Hcl]     Drowsiness   Doxycycline Other (See Comments)    diarrhea   Jardiance [Empagliflozin]     Yeast infections     Fentanyl  Anxiety    FAMILY HISTORY:  Family History  Problem Relation Age of Onset   Kidney disease Mother    Diabetes Mother        DM Type II    Alzheimer's disease Mother    Diabetes Father        IDDM   Heart disease Father    Cancer Father        bone cancer died at 75   Cancer Sister        bone cancer at age 57   Multiple sclerosis Daughter    Diabetes Son 2       DM Type I   Heart disease Maternal Grandfather    Heart disease Paternal Grandmother    Diabetes Son 75       DM Type I    SOCIAL HISTORY: Social History   Socioeconomic History   Marital status: Married    Spouse name: Not on file   Number of children: 3   Years of education: college   Highest education level: Bachelor's degree (e.g., BA, AB, BS)  Occupational History  Not on file  Tobacco Use   Smoking status: Every Day    Current packs/day: 1.00    Average packs/day: 1 pack/day for 43.0 years (43.0 ttl pk-yrs)    Types: Cigarettes   Smokeless tobacco: Never   Tobacco comments:    Currently 1/2 ppd 02/29/24  Substance and Sexual Activity   Alcohol use: No    Alcohol/week: 0.0 standard drinks of alcohol    Comment: Rarely   Drug use: No   Sexual activity: Yes  Other Topics Concern   Not on file  Social History Narrative   Lives with husband, married for 20 years.      They have three grown chlildren.   [name] is not currently working and is on disability   Highest level of education:  Engineer, maintenance (it)   Pets: two dogs and three rabbits.       Husband is a long production assistant, radio.       Is unable to enjoy any activities because of body pains.       Social Drivers of Health   Tobacco Use: High Risk (11/01/2024)   Patient History    Smoking Tobacco Use: Every Day    Smokeless Tobacco Use: Never    Passive Exposure: Not on file  Financial Resource Strain: Low Risk (04/13/2023)   Overall Financial Resource Strain (CARDIA)    Difficulty of Paying Living Expenses: Not hard at all  Food Insecurity: Patient Declined (05/13/2023)   Hunger Vital Sign    Worried About Running Out of Food in the Last Year: Patient declined    Ran Out of Food in  the Last Year: Patient declined  Transportation Needs: Unmet Transportation Needs (05/13/2023)   PRAPARE - Administrator, Civil Service (Medical): Yes    Lack of Transportation (Non-Medical): Yes  Physical Activity: Unknown (05/13/2023)   Exercise Vital Sign    Days of Exercise per Week: Patient declined    Minutes of Exercise per Session: 0 min  Recent Concern: Physical Activity - Inactive (04/13/2023)   Exercise Vital Sign    Days of Exercise per Week: 0 days    Minutes of Exercise per Session: 0 min  Stress: Stress Concern Present (05/13/2023)   [employer] of Occupational Health - Occupational Stress Questionnaire    Feeling of Stress : Very much  Social Connections: Moderately Isolated (05/13/2023)   Social Connection and Isolation Panel    Frequency of Communication with Friends and Family: Once a week    Frequency of Social Gatherings with Friends and Family: Never    Attends Religious Services: 1 to 4 times per year    Active Member of Clubs or Organizations: No    Attends Banker Meetings: Never    Marital Status: Married  Depression (PHQ2-9): High Risk (11/01/2024)   Depression (PHQ2-9)    PHQ-2 Score: 21  Alcohol Screen: Low Risk (05/13/2023)   Alcohol Screen    Last Alcohol Screening Score (AUDIT): 0  Housing: High Risk (05/13/2023)   Housing    Last Housing Risk Score: 98  Utilities: Not At Risk (04/13/2023)   AHC Utilities    Threatened with loss of utilities: No  Health Literacy: Adequate Health Literacy (04/13/2023)   B1300 Health Literacy    Frequency of need for help with medical instructions: Never    MEDICATIONS:  Current Outpatient Medications  Medication Sig Dispense Refill   aspirin  EC 81 MG tablet Take 1 tablet (81 mg total) by mouth daily.  Swallow whole.     budesonide -formoterol  (SYMBICORT ) 80-4.5 MCG/ACT inhaler Take 2 puffs first thing in am and then another 2 puffs about 12 hours later. 1 each 12   busPIRone  (BUSPAR ) 30 MG  tablet Take 1 tablet (30 mg total) by mouth 2 (two) times daily. 180 tablet 0   Cholecalciferol (VITAMIN D3) 2000 units TABS Take 1 tablet by mouth daily.     clopidogrel  (PLAVIX ) 75 MG tablet Take 1 tablet by mouth once daily 30 tablet 5   glipiZIDE  (GLUCOTROL  XL) 10 MG 24 hr tablet Take 1 tablet by mouth once daily with breakfast 90 tablet 0   Insulin  Pen Needle 32G X 4 MM MISC Use 4x a day 300 each 3   isosorbide  mononitrate (IMDUR ) 60 MG 24 hr tablet TAKE 1 & 1/2 (ONE & ONE-HALF) TABLETS BY MOUTH ONCE DAILY 135 tablet 0   LANTUS  SOLOSTAR 100 UNIT/ML Solostar Pen Inject 20 Units into the skin daily. 15 mL 5   levETIRAcetam  (KEPPRA ) 250 MG tablet TAKE 1 TABLET BY MOUTH THREE TIMES DAILY 270 tablet 0   nitroGLYCERIN  (NITROSTAT ) 0.4 MG SL tablet Place 1 tablet (0.4 mg total) under the tongue every 5 (five) minutes x 3 doses as needed for chest pain. 25 tablet 3   nystatin  cream (MYCOSTATIN ) Apply 1 Application topically 2 (two) times daily. 30 g 2   pravastatin  (PRAVACHOL ) 80 MG tablet Take 1 tablet (80 mg total) by mouth every evening. 90 tablet 3   pregabalin  (LYRICA ) 150 MG capsule Take 1 capsule by mouth twice daily 60 capsule 2   insulin  lispro (HUMALOG  KWIKPEN) 100 UNIT/ML KwikPen Take 5 units with supper and maximum 20 units/day as per sliding scale provided upto 3 times a day. 15 mL 4   No current facility-administered medications for this visit.    PHYSICAL EXAM: Vitals:   11/01/24 1435 11/01/24 1436  BP: (!) 146/70 (!) 140/72  Pulse: 94   Resp: 16   SpO2: 95%   Weight: 157 lb 3.2 oz (71.3 kg)   Height: 5' 3 (1.6 m)    Body mass index is 27.85 kg/m.  Wt Readings from Last 3 Encounters:  11/01/24 157 lb 3.2 oz (71.3 kg)  11/01/24 155 lb (70.3 kg)  10/16/24 157 lb 11.2 oz (71.5 kg)    General: Well developed, well nourished female in no apparent distress.  HEENT: AT/[place], no external lesions.  Eyes: Conjunctiva clear and no icterus. Neck: Neck supple  Lungs: Respirations  not labored Neurologic: Alert, oriented, normal speech Extremities / Skin: Dry.  Psychiatric: Does not appear depressed or anxious  Diabetic Foot Exam - Simple   No data filed    LABS Reviewed Lab Results  Component Value Date   HGBA1C 7.7 (A) 11/01/2024   HGBA1C 7.2 (A) 08/02/2024   HGBA1C 7.5 (A) 03/15/2024   No results found for: FRUCTOSAMINE Lab Results  Component Value Date   CHOL 226 (H) 10/18/2023   HDL 50 10/18/2023   LDLCALC 134 (H) 10/18/2023   LDLDIRECT 72.0 02/24/2022   TRIG 233 (H) 10/18/2023   CHOLHDL 4.5 (H) 10/18/2023   Lab Results  Component Value Date   MICRALBCREAT Unable to calculate 01/27/2024   Lab Results  Component Value Date   CREATININE 1.31 (H) 11/01/2024   Lab Results  Component Value Date   GFR 42.41 (L) 11/01/2024    Latest Reference Range & Units 08/02/24 15:49  IA-2 Antibody <5.4 U/mL <5.4  ZNT8 Antibodies <15 U/mL <10  Glutamic  Acid Decarb Ab <5 IU/mL <5  C-Peptide 0.80 - 3.85 ng/mL 3.56    ASSESSMENT / PLAN  1. Uncontrolled type 2 diabetes mellitus with hyperglycemia, with long-term current use of insulin  (HCC)     Diabetes Mellitus type 2, complicated by CAD/CKD. - Diabetic status / severity: Uncontrolled.  Worsening.  Lab Results  Component Value Date   HGBA1C 7.7 (A) 11/01/2024    - Hemoglobin A1c goal : <6.5%  [name] has postprandial hyperglycemia.  Mainly eats a big meal for supper other times [name] does not eat much and reports mostly snack.  Will start Humalog  4 supper.  - MEDICATIONS: See below.  I) continue Lantus  20 units daily.  II) take Humalog  5 units with supper.  And Humalog  sliding scale to take before eating up to 3 times a day.  Sliding Scale Blood Glucose        Insulin  60-150                     None 151-200                   None 201-250                   1 units 251-300                   2 units 301-350                   3 units 351-400                   4 units      >400                         5 units and call provider   III) continue glipizide  extended release 10 mg daily.  - Home glucose testing: Freestyle libre 3+ CGM and check as needed.  - Discussed/ Gave Hypoglycemia treatment plan.  # Consult : not required at this time.   # Annual urine for microalbuminuria/ creatinine ratio, no microalbuminuria currently, [name] has CKD 3b, was referred to nephrology in prior visit. Last  Lab Results  Component Value Date   MICRALBCREAT Unable to calculate 01/27/2024    # Foot check nightly.  # Annual dilated diabetic eye exams.  Advised to have diabetic eye exam.  - Diet: Eat reasonable portion sizes to promote a healthy weight, discussed in detail, advised to avoid high carb meal and frequent snacking. - Life style / activity / exercise: Discussed.  2. Blood pressure  -  BP Readings from Last 1 Encounters:  11/01/24 (!) 140/72    - Control is in target.  - No change in current plans.  3. Lipid status / Hyperlipidemia - Last  Lab Results  Component Value Date   LDLCALC 134 (H) 10/18/2023   - Continue pravastatin  80 mg daily.  Not at goal.  Managed by PCP at this time.  [name] was seen today for diabetes.  Diagnoses and all orders for this visit:  Uncontrolled type 2 diabetes mellitus with hyperglycemia, with long-term current use of insulin  (HCC) -     POCT glycosylated hemoglobin (Hb A1C) -     insulin  lispro (HUMALOG  KWIKPEN) 100 UNIT/ML KwikPen; Take 5 units with supper and maximum 20 units/day as per sliding scale provided upto 3 times a day.    DISPOSITION Follow up in clinic in 3 months suggested.  All questions answered and patient verbalized understanding of the plan.  Wilian Kwong, MD [facility] [address] [place], [address] [place], [place] [address] Phone # [phone]  At least part of this note was generated using voice recognition software. Inadvertent word errors may have occurred, which were not  recognized during the proofreading process. "

## 2024-11-02 ENCOUNTER — Ambulatory Visit: Admitting: Endocrinology

## 2024-11-19 ENCOUNTER — Encounter (HOSPITAL_COMMUNITY)

## 2024-11-23 ENCOUNTER — Encounter (HOSPITAL_COMMUNITY): Payer: Self-pay

## 2024-11-23 ENCOUNTER — Ambulatory Visit (HOSPITAL_COMMUNITY): Admit: 2024-11-23 | Admitting: Cardiovascular Disease

## 2024-11-23 SURGERY — LOWER EXTREMITY ANGIOGRAPHY
Anesthesia: LOCAL

## 2024-12-18 ENCOUNTER — Ambulatory Visit: Admitting: Cardiovascular Disease

## 2025-02-11 ENCOUNTER — Ambulatory Visit: Admitting: Endocrinology
